# Patient Record
Sex: Female | Born: 1970 | Race: White | Hispanic: No | Marital: Married | Smoking: Never smoker
Health system: Southern US, Community
[De-identification: ages and names within clinical notes are randomized; demographics above are authoritative.]

## PROBLEM LIST (undated history)

## (undated) DIAGNOSIS — E039 Hypothyroidism, unspecified: Secondary | ICD-10-CM

## (undated) DIAGNOSIS — M545 Low back pain: Secondary | ICD-10-CM

## (undated) DIAGNOSIS — E559 Vitamin D deficiency, unspecified: Secondary | ICD-10-CM

## (undated) DIAGNOSIS — M2550 Pain in unspecified joint: Secondary | ICD-10-CM

## (undated) DIAGNOSIS — R0989 Other specified symptoms and signs involving the circulatory and respiratory systems: Secondary | ICD-10-CM

## (undated) DIAGNOSIS — Z124 Encounter for screening for malignant neoplasm of cervix: Secondary | ICD-10-CM

## (undated) DIAGNOSIS — Z86718 Personal history of other venous thrombosis and embolism: Secondary | ICD-10-CM

## (undated) DIAGNOSIS — K829 Disease of gallbladder, unspecified: Secondary | ICD-10-CM

## (undated) DIAGNOSIS — F32A Depression, unspecified: Secondary | ICD-10-CM

## (undated) DIAGNOSIS — N390 Urinary tract infection, site not specified: Secondary | ICD-10-CM

## (undated) DIAGNOSIS — R011 Cardiac murmur, unspecified: Secondary | ICD-10-CM

## (undated) DIAGNOSIS — Z0000 Encounter for general adult medical examination without abnormal findings: Secondary | ICD-10-CM

## (undated) DIAGNOSIS — B019 Varicella without complication: Secondary | ICD-10-CM

## (undated) DIAGNOSIS — R51 Headache: Secondary | ICD-10-CM

## (undated) DIAGNOSIS — G540 Brachial plexus disorders: Secondary | ICD-10-CM

## (undated) DIAGNOSIS — E669 Obesity, unspecified: Secondary | ICD-10-CM

## (undated) DIAGNOSIS — M069 Rheumatoid arthritis, unspecified: Secondary | ICD-10-CM

## (undated) DIAGNOSIS — M25562 Pain in left knee: Principal | ICD-10-CM

## (undated) DIAGNOSIS — L309 Dermatitis, unspecified: Secondary | ICD-10-CM

## (undated) DIAGNOSIS — J209 Acute bronchitis, unspecified: Principal | ICD-10-CM

## (undated) DIAGNOSIS — E063 Autoimmune thyroiditis: Secondary | ICD-10-CM

## (undated) DIAGNOSIS — J45909 Unspecified asthma, uncomplicated: Secondary | ICD-10-CM

## (undated) DIAGNOSIS — K219 Gastro-esophageal reflux disease without esophagitis: Secondary | ICD-10-CM

## (undated) DIAGNOSIS — K589 Irritable bowel syndrome without diarrhea: Secondary | ICD-10-CM

## (undated) DIAGNOSIS — M1990 Unspecified osteoarthritis, unspecified site: Secondary | ICD-10-CM

## (undated) DIAGNOSIS — R519 Headache, unspecified: Secondary | ICD-10-CM

## (undated) DIAGNOSIS — K921 Melena: Secondary | ICD-10-CM

## (undated) DIAGNOSIS — R0602 Shortness of breath: Secondary | ICD-10-CM

## (undated) DIAGNOSIS — M549 Dorsalgia, unspecified: Secondary | ICD-10-CM

## (undated) DIAGNOSIS — F419 Anxiety disorder, unspecified: Secondary | ICD-10-CM

## (undated) DIAGNOSIS — G43909 Migraine, unspecified, not intractable, without status migrainosus: Secondary | ICD-10-CM

## (undated) DIAGNOSIS — T7840XA Allergy, unspecified, initial encounter: Secondary | ICD-10-CM

## (undated) DIAGNOSIS — R079 Chest pain, unspecified: Secondary | ICD-10-CM

## (undated) HISTORY — PX: ENDOMETRIAL ABLATION: SHX621

## (undated) HISTORY — DX: Irritable bowel syndrome, unspecified: K58.9

## (undated) HISTORY — DX: Gastro-esophageal reflux disease without esophagitis: K21.9

## (undated) HISTORY — DX: Low back pain: M54.5

## (undated) HISTORY — DX: Other specified symptoms and signs involving the circulatory and respiratory systems: R09.89

## (undated) HISTORY — DX: Depression, unspecified: F32.A

## (undated) HISTORY — PX: CHOLECYSTECTOMY: SHX55

## (undated) HISTORY — DX: Chest pain, unspecified: R07.9

## (undated) HISTORY — DX: Unspecified osteoarthritis, unspecified site: M19.90

## (undated) HISTORY — DX: Obesity, unspecified: E66.9

## (undated) HISTORY — DX: Headache: R51

## (undated) HISTORY — DX: Encounter for general adult medical examination without abnormal findings: Z00.00

## (undated) HISTORY — DX: Acute bronchitis, unspecified: J20.9

## (undated) HISTORY — DX: Allergy, unspecified, initial encounter: T78.40XA

## (undated) HISTORY — DX: Pain in left knee: M25.562

## (undated) HISTORY — DX: Melena: K92.1

## (undated) HISTORY — PX: TONSILLECTOMY: SUR1361

## (undated) HISTORY — DX: Cardiac murmur, unspecified: R01.1

## (undated) HISTORY — DX: Encounter for screening for malignant neoplasm of cervix: Z12.4

## (undated) HISTORY — DX: Urinary tract infection, site not specified: N39.0

## (undated) HISTORY — DX: Varicella without complication: B01.9

## (undated) HISTORY — DX: Rheumatoid arthritis, unspecified: M06.9

## (undated) HISTORY — DX: Pain in unspecified joint: M25.50

## (undated) HISTORY — DX: Migraine, unspecified, not intractable, without status migrainosus: G43.909

## (undated) HISTORY — DX: Autoimmune thyroiditis: E06.3

## (undated) HISTORY — DX: Dorsalgia, unspecified: M54.9

## (undated) HISTORY — DX: Brachial plexus disorders: G54.0

## (undated) HISTORY — DX: Headache, unspecified: R51.9

## (undated) HISTORY — DX: Dermatitis, unspecified: L30.9

## (undated) HISTORY — DX: Disease of gallbladder, unspecified: K82.9

## (undated) HISTORY — DX: Anxiety disorder, unspecified: F41.9

## (undated) HISTORY — DX: Personal history of other venous thrombosis and embolism: Z86.718

## (undated) HISTORY — DX: Shortness of breath: R06.02

## (undated) HISTORY — DX: Unspecified asthma, uncomplicated: J45.909

## (undated) HISTORY — PX: APPENDECTOMY: SHX54

---
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
## 1992-12-18 HISTORY — PX: APPENDECTOMY: SHX54

## 1997-12-18 HISTORY — PX: TUBAL LIGATION: SHX77

## 1998-02-15 ENCOUNTER — Ambulatory Visit: Admission: RE | Admit: 1998-02-15 | Discharge: 1998-02-15 | Payer: Self-pay | Admitting: Obstetrics and Gynecology

## 1998-04-21 ENCOUNTER — Inpatient Hospital Stay (HOSPITAL_COMMUNITY): Admission: AD | Admit: 1998-04-21 | Discharge: 1998-04-24 | Payer: Self-pay | Admitting: Obstetrics and Gynecology

## 1999-01-10 ENCOUNTER — Emergency Department (HOSPITAL_COMMUNITY): Admission: EM | Admit: 1999-01-10 | Discharge: 1999-01-10 | Payer: Self-pay | Admitting: Emergency Medicine

## 1999-10-19 ENCOUNTER — Encounter: Payer: Self-pay | Admitting: Emergency Medicine

## 1999-10-19 ENCOUNTER — Emergency Department (HOSPITAL_COMMUNITY): Admission: EM | Admit: 1999-10-19 | Discharge: 1999-10-19 | Payer: Self-pay | Admitting: Emergency Medicine

## 2000-07-03 ENCOUNTER — Other Ambulatory Visit: Admission: RE | Admit: 2000-07-03 | Discharge: 2000-07-03 | Payer: Self-pay | Admitting: Obstetrics and Gynecology

## 2001-07-09 ENCOUNTER — Other Ambulatory Visit: Admission: RE | Admit: 2001-07-09 | Discharge: 2001-07-09 | Payer: Self-pay | Admitting: Obstetrics and Gynecology

## 2002-11-18 ENCOUNTER — Other Ambulatory Visit: Admission: RE | Admit: 2002-11-18 | Discharge: 2002-11-18 | Payer: Self-pay | Admitting: Obstetrics and Gynecology

## 2004-06-03 ENCOUNTER — Other Ambulatory Visit: Admission: RE | Admit: 2004-06-03 | Discharge: 2004-06-03 | Payer: Self-pay | Admitting: Obstetrics and Gynecology

## 2004-07-21 ENCOUNTER — Encounter (INDEPENDENT_AMBULATORY_CARE_PROVIDER_SITE_OTHER): Payer: Self-pay | Admitting: *Deleted

## 2004-07-21 ENCOUNTER — Ambulatory Visit (HOSPITAL_COMMUNITY): Admission: RE | Admit: 2004-07-21 | Discharge: 2004-07-21 | Payer: Self-pay | Admitting: Obstetrics and Gynecology

## 2005-06-08 ENCOUNTER — Other Ambulatory Visit: Admission: RE | Admit: 2005-06-08 | Discharge: 2005-06-08 | Payer: Self-pay | Admitting: Obstetrics and Gynecology

## 2007-12-19 HISTORY — PX: ABLATION: SHX5711

## 2008-07-30 ENCOUNTER — Ambulatory Visit (HOSPITAL_COMMUNITY): Admission: RE | Admit: 2008-07-30 | Discharge: 2008-07-30 | Payer: Self-pay | Admitting: Internal Medicine

## 2008-08-19 ENCOUNTER — Encounter (INDEPENDENT_AMBULATORY_CARE_PROVIDER_SITE_OTHER): Payer: Self-pay | Admitting: General Surgery

## 2008-08-19 ENCOUNTER — Ambulatory Visit (HOSPITAL_COMMUNITY): Admission: RE | Admit: 2008-08-19 | Discharge: 2008-08-20 | Payer: Self-pay | Admitting: General Surgery

## 2008-11-30 ENCOUNTER — Other Ambulatory Visit: Admission: RE | Admit: 2008-11-30 | Discharge: 2008-11-30 | Payer: Self-pay | Admitting: Internal Medicine

## 2009-09-17 ENCOUNTER — Emergency Department (HOSPITAL_COMMUNITY): Admission: EM | Admit: 2009-09-17 | Discharge: 2009-09-17 | Payer: Self-pay | Admitting: Emergency Medicine

## 2009-10-25 ENCOUNTER — Ambulatory Visit (HOSPITAL_COMMUNITY): Admission: RE | Admit: 2009-10-25 | Discharge: 2009-10-25 | Payer: Self-pay | Admitting: Internal Medicine

## 2009-12-18 HISTORY — PX: CHOLECYSTECTOMY: SHX55

## 2009-12-18 LAB — HM PAP SMEAR: HM Pap smear: NORMAL

## 2010-03-15 ENCOUNTER — Encounter (INDEPENDENT_AMBULATORY_CARE_PROVIDER_SITE_OTHER): Payer: Self-pay | Admitting: Internal Medicine

## 2010-03-15 ENCOUNTER — Ambulatory Visit (HOSPITAL_COMMUNITY): Admission: RE | Admit: 2010-03-15 | Discharge: 2010-03-15 | Payer: Self-pay | Admitting: Internal Medicine

## 2010-08-02 ENCOUNTER — Encounter (HOSPITAL_COMMUNITY): Admission: RE | Admit: 2010-08-02 | Discharge: 2010-09-01 | Payer: Self-pay | Admitting: Neurology

## 2010-09-07 ENCOUNTER — Encounter (HOSPITAL_COMMUNITY): Admission: RE | Admit: 2010-09-07 | Discharge: 2010-10-07 | Payer: Self-pay | Admitting: Neurology

## 2010-10-17 ENCOUNTER — Ambulatory Visit (HOSPITAL_COMMUNITY)
Admission: RE | Admit: 2010-10-17 | Discharge: 2010-10-17 | Payer: Self-pay | Source: Home / Self Care | Admitting: Gastroenterology

## 2010-12-21 ENCOUNTER — Ambulatory Visit (HOSPITAL_COMMUNITY)
Admission: RE | Admit: 2010-12-21 | Discharge: 2010-12-21 | Payer: Self-pay | Source: Home / Self Care | Attending: Gastroenterology | Admitting: Gastroenterology

## 2011-03-23 LAB — URINE MICROSCOPIC-ADD ON

## 2011-03-23 LAB — URINALYSIS, ROUTINE W REFLEX MICROSCOPIC
Glucose, UA: NEGATIVE mg/dL
Leukocytes, UA: NEGATIVE
Protein, ur: NEGATIVE mg/dL
Specific Gravity, Urine: 1.025 (ref 1.005–1.030)
pH: 6 (ref 5.0–8.0)

## 2011-03-23 LAB — URINE CULTURE

## 2011-05-02 NOTE — Op Note (Signed)
NAMEESMA, Alicia Cox             ACCOUNT NO.:  [id number]   MEDICAL RECORD NO.:  [id number]          PATIENT TYPE:  OIB   LOCATION:  A306                          FACILITY:  [facility]   PHYSICIAN:  Barbaraann Barthel, M.D. DATE OF BIRTH:  May 29, 1971   DATE OF PROCEDURE:  08/19/2008  DATE OF DISCHARGE:                               OPERATIVE REPORT   SURGEON:  Barbaraann Barthel, MD   PREOPERATIVE DIAGNOSES:  1. Cholecystitis.  2. Cholelithiasis.   POSTOPERATIVE DIAGNOSES:  1. Cholecystitis.  2. Cholelithiasis.   PROCEDURE:  Laparoscopic cholecystectomy.   SPECIMEN:  Gallbladder with stones.   WOUND CLASSIFICATION:  Clean, contaminated.   NOTE:  This is a 40 year old white female referred from [facility] for gallbladder disease.  She had had a 77-month history of right  upper quadrant pain, nausea, and vomiting.  Her sonogram showed multiple  stones within the gallbladder.  The gallbladder almost completely  impacted with stones.  In fact, liver function studies were grossly  within normal limits.  The patient was placed on a restrictive diet.  We  planned for elective surgery.  We discussed complications not limited  to, but including bleeding, infection, damage to bile ducts, perforation  of organs, transitory diarrhea, and possibility of open surgery might be  required.  Informed consent was obtained.   GROSS OPERATIVE FINDINGS:  The patient had some adhesions around the  liver from previous surgery and a gallbladder that was completely  impacted with stones.  Cystic duct was normal in size, we did not  cannulate this.  The right upper quadrant was, otherwise, within normal  limits.  There was considerable fatty infiltration of the distal portion  of the gallbladder around [facility] pouch.  There was a prominent node of  Calot.  Otherwise, the right upper quadrant was grossly within normal  limits with the signs of subsiding acute episode of cholecystitis.   The  patient was placed in a supine position, and after adequate  administration of general anesthesia via endotracheal intubation, a  Foley catheter was aseptically inserted.  The patient was prepped with  Betadine solution and draped in the usual manner.  A periumbilical  incision was carried out above the previous umbilical incision.  We  dissected down to the fascia, opened this, and then placed a Veress  needle and confirmed this position with a saline drop test.  We elevated  the fascia and opened the fascia directly under direct vision.  The  abdomen was then insufflated with approximately 3.5 L of CO2.  An 11-mm  cannula was placed using the Visiport technique, and under direct  vision, an 11-mm cannula was placed in the epigastrium and two 5-mm  cannulas in the right upper quadrant laterally.  The gallbladder was  grasped.  Adhesions were taken down.  The cystic duct was clearly  visualized, and after considerable dissection, I triply silver clipped  on the side of the common bile duct, singly silver clipped on the side  of the gallbladder and divided, as was the cystic artery that was also  visualized and clipped.  The gallbladder  was slightly intrahepatic.  We  had a little oozing from the liver, but, however, we controlled this  with the cautery device without any problem.  The gallbladder was then  removed using the Endosac device, and I elected to leave some Surgicel  in the liver bed and a Jackson-Pratt drain in the liver bed as well.  This was exited through one of the lateral 5-mm cannula sites and the  gallbladder was enlarged.  We had to open the epigastric incision a  little bit.  There was a little bleeding from the area of the rectus  muscle.  We controlled this with the cautery device and dissected free  and clearly and cauterized this.  We then closed the fascia in the area  of the epigastrium and the umbilicus with 0-Polysorb sutures.  We used  0.5% Sensorcaine at all  port sites for postoperative comfort and closed  the skin incisions with a stapling device.  I sutured the drain in place  with a 3-0 nylon suture.  Prior to closure, all sponge, needle, and  instrument counts were found to be correct.  Estimated blood loss was  approximately 100 mL.  This was the pesky bleeding around the liver and  the rectus muscle.  One Jackson-Pratt drain was placed at the liver bed  as noted, and prior to closure, all sponge, needle, and instrument  counts were found to be correct.  There were no complications.      Barbaraann Barthel, M.D.  Electronically Signed     WB/MEDQ  D:  08/19/2008  T:  08/20/2008  Job:  [id number]   cc:   [facility]

## 2011-05-05 NOTE — Op Note (Signed)
NAME:  Alicia Cox, Alicia Cox                       ACCOUNT NO.:  [id number]   MEDICAL RECORD NO.:  [id number]                   PATIENT TYPE:  AMB   LOCATION:  SDC                                  FACILITY:  [facility]   PHYSICIAN:  Malva Limes, M.D.                 DATE OF BIRTH:  1971/07/19   DATE OF PROCEDURE:  07/21/2004  DATE OF DISCHARGE:                                 OPERATIVE REPORT   PREOPERATIVE DIAGNOSIS:  Menorrhagia.   POSTOPERATIVE DIAGNOSIS:  Menorrhagia.   OPERATION PERFORMED:  1. Dilation and curettage.  2. Endometrial ablation with NovaSure device.   SURGEON:  Malva Limes, M.D.   ANESTHESIA:  General.   ANTIBIOTICS:  Ancef 1 g.   ESTIMATED BLOOD LOSS:  Minimal.   COMPLICATIONS:  None.   SPECIMENS:  Endometrial curettings to pathology.   DRAINS:  None.   DESCRIPTION OF PROCEDURE:  The patient was taken to the operating room where  she was placed in dorsal supine position.  General anesthetic was  administered without complications.  She was then placed in dorsal lithotomy  position.  She was prepped with Hibiclens and draped in the usual fashion  for this procedure.  At this point a sterile speculum was placed in the  vagina. A single toothed tenaculum was applied to the anterior cervical lip.  10 mL of 1% lidocaine was used for a paracervical block.  The cervical os  was then dilated to a 6 [place].  The sound was placed and the uterine cavity  was sounded to 10 cm.  At this point an 8 [place] Novak curet was placed into  the cervical canal to measure the cervical length.  This was recorded at 4  cm giving a cavity length of 6 cm.  Following this, a sharp curettage was  performed with endometrial tissue sent to pathology.  At this point the  NovaSure device was advanced through the endocervical canal to the fundus of  the uterus.  The seating procedure was then followed.  Following this the  CO2 seal was tested and passed.  Following this, the device was  turned on  for a total of 51 seconds.  The total power was 145.  The cavity width was  4.4.  The patient tolerated the procedure well and will be discharged to  home.  She will be told to take Advil as needed and return to the office in  four weeks.                                               Malva Limes, M.D.   MA/MEDQ  D:  07/21/2004  T:  07/22/2004  Job:  [id number]

## 2011-09-20 LAB — HEPATIC FUNCTION PANEL
ALT: 87 — ABNORMAL HIGH
Alkaline Phosphatase: 56
Bilirubin, Direct: 0.2
Indirect Bilirubin: 0.5
Total Bilirubin: 0.7

## 2011-09-20 LAB — BASIC METABOLIC PANEL
Chloride: 109
GFR calc non Af Amer: >60
Glucose, Bld: 108 — ABNORMAL HIGH
Potassium: 4
Sodium: 140

## 2011-09-20 LAB — CBC
HCT: 37.3
Hemoglobin: 13
MCV: 95.6
WBC: 13.6 — ABNORMAL HIGH

## 2011-09-20 LAB — DIFFERENTIAL
Eosinophils Relative: 0
Lymphocytes Relative: 16
Lymphs Abs: 2.1
Monocytes Absolute: 1

## 2012-11-02 ENCOUNTER — Encounter (HOSPITAL_BASED_OUTPATIENT_CLINIC_OR_DEPARTMENT_OTHER): Payer: Self-pay | Admitting: Emergency Medicine

## 2012-11-02 ENCOUNTER — Emergency Department (HOSPITAL_BASED_OUTPATIENT_CLINIC_OR_DEPARTMENT_OTHER): Payer: BC Managed Care – PPO

## 2012-11-02 ENCOUNTER — Emergency Department (HOSPITAL_BASED_OUTPATIENT_CLINIC_OR_DEPARTMENT_OTHER)
Admission: EM | Admit: 2012-11-02 | Discharge: 2012-11-02 | Disposition: A | Discharge status: Home / Self Care | Payer: BC Managed Care – PPO | Attending: Emergency Medicine | Admitting: Emergency Medicine

## 2012-11-02 DIAGNOSIS — E039 Hypothyroidism, unspecified: Secondary | ICD-10-CM | POA: Insufficient documentation

## 2012-11-02 DIAGNOSIS — E559 Vitamin D deficiency, unspecified: Secondary | ICD-10-CM | POA: Insufficient documentation

## 2012-11-02 DIAGNOSIS — N760 Acute vaginitis: Secondary | ICD-10-CM | POA: Insufficient documentation

## 2012-11-02 DIAGNOSIS — Z79899 Other long term (current) drug therapy: Secondary | ICD-10-CM | POA: Insufficient documentation

## 2012-11-02 HISTORY — DX: Hypothyroidism, unspecified: E03.9

## 2012-11-02 HISTORY — DX: Vitamin D deficiency, unspecified: E55.9

## 2012-11-02 LAB — WET PREP, GENITAL

## 2012-11-02 LAB — URINALYSIS, ROUTINE W REFLEX MICROSCOPIC
Bilirubin Urine: NEGATIVE
Hgb urine dipstick: NEGATIVE
Ketones, ur: NEGATIVE mg/dL
Protein, ur: NEGATIVE mg/dL
Urobilinogen, UA: 0.2 mg/dL (ref 0.0–1.0)

## 2012-11-02 MED ORDER — METRONIDAZOLE 500 MG PO TABS: 500.0000 mg | ORAL_TABLET | Freq: Two times a day (BID) | ORAL | Status: DC

## 2012-11-02 MED ORDER — METRONIDAZOLE 500 MG PO TABS
500.0000 mg | ORAL_TABLET | Freq: Once | ORAL | Status: AC
  Administered 2012-11-02: 500 mg via ORAL
  Filled 2012-11-02: qty 1

## 2012-11-02 MED ORDER — SODIUM CHLORIDE 0.9 % IV SOLN: Freq: Once | INTRAVENOUS | Status: DC

## 2012-11-02 MED ORDER — HYDROCODONE-ACETAMINOPHEN 5-325 MG PO TABS: 1.0000 | ORAL_TABLET | ORAL | Status: AC | PRN

## 2012-11-02 MED ORDER — HYDROCODONE-ACETAMINOPHEN 5-325 MG PO TABS
1.0000 | ORAL_TABLET | Freq: Once | ORAL | Status: AC
  Administered 2012-11-02: 1 via ORAL
  Filled 2012-11-02: qty 1

## 2012-11-02 MED ORDER — ONDANSETRON HCL 4 MG/2ML IJ SOLN
4.0000 mg | Freq: Once | INTRAMUSCULAR | Status: AC
  Administered 2012-11-02: 4 mg via INTRAVENOUS
  Filled 2012-11-02: qty 2

## 2012-11-02 MED ORDER — MORPHINE SULFATE 4 MG/ML IJ SOLN
4.0000 mg | Freq: Once | INTRAMUSCULAR | Status: AC
Start: 2012-11-02 — End: 2012-11-02
  Administered 2012-11-02: 4 mg via INTRAVENOUS
  Filled 2012-11-02: qty 1

## 2012-11-02 NOTE — ED Provider Notes (Signed)
History     CSN: [id number]  Arrival date & time 11/02/12  0865   None     Chief Complaint  Patient presents with  . Abdominal Pain    (Consider location/radiation/quality/duration/timing/severity/associated sxs/prior treatment) Patient is a 41 y.o. female presenting with abdominal pain. The history is provided by the patient.  Abdominal Pain The primary symptoms of the illness include abdominal pain. The primary symptoms of the illness do not include fever, nausea, vomiting, diarrhea or dysuria. The current episode started more than 2 days ago (Pt has a 3 day history of right lower abdominal pain.).  The abdominal pain began 2 days ago. The pain came on gradually. The abdominal pain has been unchanged since its onset. The abdominal pain is located in the RLQ. The abdominal pain does not radiate. The severity of the abdominal pain is 8/10. The abdominal pain is relieved by nothing. Exacerbated by: Nothing.  Associated with: Nothing. The patient states that she believes she is currently not pregnant. The patient has not had a change in bowel habit. Risk factors for an acute abdominal problem include a history of abdominal surgery. Symptoms associated with the illness do not include chills.    Past Medical History  Diagnosis Date  . Hypothyroidism   . Vitamin D deficiency     Past Surgical History  Procedure Date  . Tonsillectomy   . Cholecystectomy   . Appendectomy   . Cesarean section   . Endometrial ablation     No family history on file.  History  Substance Use Topics  . Smoking status: Not on file  . Smokeless tobacco: Not on file  . Alcohol Use:     OB History    Grav Para Term Preterm Abortions TAB SAB Ect Mult Living                  Review of Systems  Constitutional: Negative for fever and chills.  HENT: Negative.   Eyes: Negative.   Respiratory: Negative.   Cardiovascular: Negative.   Gastrointestinal: Positive for abdominal pain. Negative for  nausea, vomiting and diarrhea.  Genitourinary: Negative.  Negative for dysuria and difficulty urinating.       No menses, no vaginal discharge.  Musculoskeletal: Negative.   Skin: Negative.   Neurological: Negative.   Psychiatric/Behavioral: Negative.     Allergies  Review of patient's allergies indicates no known allergies.  Home Medications   Current Outpatient Rx  Name  Route  Sig  Dispense  Refill  . VITAMIN D 1000 UNITS PO TABS   Oral   Take 10,000 Units by mouth once a week.         . OMEGA-3 FATTY ACIDS 1000 MG PO CAPS   Oral   Take 2 g by mouth daily.         Marland Kitchen LEVOTHYROXINE SODIUM 88 MCG PO TABS   Oral   Take 88 mcg by mouth daily.           BP 134/92  Pulse 71  Temp 98.1 F (36.7 C) (Oral)  Resp 16  Ht 5\' 4"  (1.626 m)  Wt 230 lb (104.327 kg)  BMI 39.48 kg/m2  SpO2 96%  Physical Exam  Nursing note and vitals reviewed. Constitutional: She is oriented to person, place, and time. She appears well-developed and well-nourished.       In mild-moderate distress with RLQ pain.  HENT:  Head: Normocephalic and atraumatic.  Right Ear: External ear normal.  Left Ear: External ear  normal.  Eyes: Conjunctivae normal and EOM are normal. Pupils are equal, round, and reactive to light. No scleral icterus.  Neck: Normal range of motion. Neck supple.  Cardiovascular: Normal rate, regular rhythm and normal heart sounds.   Pulmonary/Chest: Effort normal and breath sounds normal.  Abdominal: Soft. Bowel sounds are normal.       She localizes pain to the right lower abdomen.  There is no mass or point tenderness, no rebound or rigidity.  Genitourinary:       She has a grayish mucoid discharge.  Bimanual exam shows mild right adnexal tenderness.  There is no apparent uterine or left adnexal tenderness or enlargement.  Exam lacks some sensitivity due to pt's body habitus.  Musculoskeletal: Normal range of motion. She exhibits no edema and no tenderness.  Neurological:  She is alert and oriented to person, place, and time.       No sensory or motor deficit.  Skin: Skin is warm and dry.  Psychiatric: She has a normal mood and affect. Her behavior is normal.    ED Course  Procedures (including critical care time)   9:21 AM Pt seen --> physical exam performed.  Pelvic cart ordered.  Lab tests and pelvic ultrasound ordered.   1. Bacterial vaginosis    Wet prep showed clue cells --> Dx bacterial vaginosis.  Pelvic ultrasound negative.  Rx metronidazole, hydrocodone-acetaminophen.    Carleene Cooper III, MD 11/02/12 [phone]

## 2012-11-02 NOTE — ED Notes (Signed)
The pelvic cart is set up at the bedside and ready for the doctor to use. 

## 2012-11-02 NOTE — ED Notes (Signed)
Pt having RLQ abdominal pain since Thursday.  Some nausea.  No vomiting.  No known fever.  Pt denies dysuria.

## 2012-11-02 NOTE — ED Notes (Signed)
Patient transported to Ultrasound 

## 2012-11-04 LAB — GC/CHLAMYDIA PROBE AMP: GC Probe RNA: NEGATIVE

## 2013-04-23 ENCOUNTER — Ambulatory Visit (INDEPENDENT_AMBULATORY_CARE_PROVIDER_SITE_OTHER): Payer: BC Managed Care – PPO | Admitting: General Practice

## 2013-04-23 ENCOUNTER — Encounter: Payer: Self-pay | Admitting: General Practice

## 2013-04-23 ENCOUNTER — Telehealth: Payer: Self-pay | Admitting: Nurse Practitioner

## 2013-04-23 VITALS — BP 125/80 | HR 82 | Temp 98.8°F | Ht 64.0 in | Wt 250.0 lb

## 2013-04-23 DIAGNOSIS — N390 Urinary tract infection, site not specified: Secondary | ICD-10-CM

## 2013-04-23 DIAGNOSIS — R350 Frequency of micturition: Secondary | ICD-10-CM

## 2013-04-23 LAB — POCT URINALYSIS DIPSTICK
Glucose, UA: NEGATIVE
Nitrite, UA: NEGATIVE
Protein, UA: NEGATIVE
Spec Grav, UA: 1.02
Urobilinogen, UA: NEGATIVE
pH, UA: 6.5

## 2013-04-23 LAB — POCT UA - MICROSCOPIC ONLY

## 2013-04-23 MED ORDER — CIPROFLOXACIN HCL 500 MG PO TABS: 500.0000 mg | ORAL_TABLET | Freq: Two times a day (BID) | ORAL | Status: DC

## 2013-04-23 NOTE — Telephone Encounter (Signed)
appt given for 2:30 with Alicia Cox

## 2013-04-23 NOTE — Progress Notes (Signed)
  Subjective:    Patient ID: Alicia Cox, female    DOB: 08-Mar-1971, 42 y.o.   MRN: [id number]  Urinary Tract Infection  This is a new problem. The current episode started in the past 7 days (also back and abdominal pain before urinating). The problem has been gradually worsening. The quality of the pain is described as aching. The pain is at a severity of 7/10. The pain is moderate. There has been no fever. She is sexually active. There is no history of pyelonephritis. Associated symptoms include flank pain and frequency. Pertinent negatives include no chills. She has tried NSAIDs for the symptoms. Her past medical history is significant for recurrent UTIs. There is no history of kidney stones.   Reports drinking two cups of coffee daily and water (40oz)  the remaining day.    Review of Systems  Constitutional: Negative for chills.  Gastrointestinal: Positive for abdominal pain.  Genitourinary: Positive for dysuria, frequency and flank pain. Negative for difficulty urinating and dyspareunia.  Musculoskeletal: Positive for back pain.  Skin: Negative.   Neurological: Negative for dizziness and headaches.  Psychiatric/Behavioral: Negative.        Objective:   Physical Exam  Constitutional: She is oriented to person, place, and time. She appears well-developed and well-nourished.  Cardiovascular: Normal rate, regular rhythm and normal heart sounds.   Pulmonary/Chest: Effort normal and breath sounds normal.  Abdominal: Soft. Bowel sounds are normal. She exhibits no distension. There is no tenderness. There is no rebound and no guarding.  Neurological: She is alert and oriented to person, place, and time.  Skin: Skin is warm and dry.  Psychiatric: She has a normal mood and affect.   Results for orders placed in visit on 04/23/13  POCT UA - MICROSCOPIC ONLY      Result Value Range   WBC, Ur, HPF, POC 0-1     RBC, urine, microscopic 2-7     Bacteria, U Microscopic mod     Mucus, UA  mod     Epithelial cells, urine per micros occ     Crystals, Ur, HPF, POC neg     Casts, Ur, LPF, POC occ     Yeast, UA neg    POCT URINALYSIS DIPSTICK      Result Value Range   Color, UA yellow     Clarity, UA clear     Glucose, UA neg     Bilirubin, UA neg     Ketones, UA neg     Spec Grav, UA 1.020     Blood, UA trace     pH, UA 6.5     Protein, UA neg     Urobilinogen, UA negative     Nitrite, UA neg     Leukocytes, UA Negative            Assessment & Plan:  Take antibiotics until complete, even if feeling better Increase fluid intake Frequent voiding Proper perineal hygiene RTO prn Culture pending Patient verbalized understanding Coralie Keens, FNP-C

## 2013-04-23 NOTE — Patient Instructions (Signed)
Urinary Tract Infection Urinary tract infections (UTIs) can develop anywhere along your urinary tract. Your urinary tract is your body's drainage system for removing wastes and extra water. Your urinary tract includes two kidneys, two ureters, a bladder, and a urethra. Your kidneys are a pair of bean-shaped organs. Each kidney is about the size of your fist. They are located below your ribs, one on each side of your spine. CAUSES Infections are caused by microbes, which are microscopic organisms, including fungi, viruses, and bacteria. These organisms are so small that they can only be seen through a microscope. Bacteria are the microbes that most commonly cause UTIs. SYMPTOMS  Symptoms of UTIs may vary by age and gender of the patient and by the location of the infection. Symptoms in young women typically include a frequent and intense urge to urinate and a painful, burning feeling in the bladder or urethra during urination. Older women and men are more likely to be tired, shaky, and weak and have muscle aches and abdominal pain. A fever may mean the infection is in your kidneys. Other symptoms of a kidney infection include pain in your back or sides below the ribs, nausea, and vomiting. DIAGNOSIS To diagnose a UTI, your caregiver will ask you about your symptoms. Your caregiver also will ask to provide a urine sample. The urine sample will be tested for bacteria and white blood cells. White blood cells are made by your body to help fight infection. TREATMENT  Typically, UTIs can be treated with medication. Because most UTIs are caused by a bacterial infection, they usually can be treated with the use of antibiotics. The choice of antibiotic and length of treatment depend on your symptoms and the type of bacteria causing your infection. HOME CARE INSTRUCTIONS  If you were prescribed antibiotics, take them exactly as your caregiver instructs you. Finish the medication even if you feel better after you  have only taken some of the medication.  Drink enough water and fluids to keep your urine clear or pale yellow.  Avoid caffeine, tea, and carbonated beverages. They tend to irritate your bladder.  Empty your bladder often. Avoid holding urine for long periods of time.  Empty your bladder before and after sexual intercourse.  After a bowel movement, women should cleanse from front to back. Use each tissue only once. SEEK MEDICAL CARE IF:   You have back pain.  You develop a fever.  Your symptoms do not begin to resolve within 3 days. SEEK IMMEDIATE MEDICAL CARE IF:   You have severe back pain or lower abdominal pain.  You develop chills.  You have nausea or vomiting.  You have continued burning or discomfort with urination. MAKE SURE YOU:   Understand these instructions.  Will watch your condition.  Will get help right away if you are not doing well or get worse. Document Released: 09/13/2005 Document Revised: 06/04/2012 Document Reviewed: 01/12/2012 [employer] Patient Information 2013 [employer].  

## 2013-08-22 ENCOUNTER — Ambulatory Visit (INDEPENDENT_AMBULATORY_CARE_PROVIDER_SITE_OTHER): Payer: BC Managed Care – PPO | Admitting: Family Medicine

## 2013-08-22 ENCOUNTER — Other Ambulatory Visit: Payer: Self-pay | Admitting: Family Medicine

## 2013-08-22 ENCOUNTER — Encounter: Payer: Self-pay | Admitting: Family Medicine

## 2013-08-22 VITALS — BP 112/70 | HR 76 | Temp 98.3°F | Ht 64.0 in | Wt 257.1 lb

## 2013-08-22 DIAGNOSIS — K219 Gastro-esophageal reflux disease without esophagitis: Secondary | ICD-10-CM

## 2013-08-22 DIAGNOSIS — E039 Hypothyroidism, unspecified: Secondary | ICD-10-CM | POA: Insufficient documentation

## 2013-08-22 DIAGNOSIS — T7840XA Allergy, unspecified, initial encounter: Secondary | ICD-10-CM

## 2013-08-22 DIAGNOSIS — E785 Hyperlipidemia, unspecified: Secondary | ICD-10-CM

## 2013-08-22 DIAGNOSIS — G43909 Migraine, unspecified, not intractable, without status migrainosus: Secondary | ICD-10-CM | POA: Insufficient documentation

## 2013-08-22 DIAGNOSIS — R739 Hyperglycemia, unspecified: Secondary | ICD-10-CM

## 2013-08-22 DIAGNOSIS — E559 Vitamin D deficiency, unspecified: Secondary | ICD-10-CM

## 2013-08-22 DIAGNOSIS — R002 Palpitations: Secondary | ICD-10-CM

## 2013-08-22 DIAGNOSIS — R7309 Other abnormal glucose: Secondary | ICD-10-CM

## 2013-08-22 DIAGNOSIS — M542 Cervicalgia: Secondary | ICD-10-CM

## 2013-08-22 DIAGNOSIS — R011 Cardiac murmur, unspecified: Secondary | ICD-10-CM | POA: Insufficient documentation

## 2013-08-22 DIAGNOSIS — R51 Headache: Secondary | ICD-10-CM

## 2013-08-22 DIAGNOSIS — G540 Brachial plexus disorders: Secondary | ICD-10-CM

## 2013-08-22 DIAGNOSIS — R079 Chest pain, unspecified: Secondary | ICD-10-CM

## 2013-08-22 DIAGNOSIS — R5381 Other malaise: Secondary | ICD-10-CM

## 2013-08-22 DIAGNOSIS — M1990 Unspecified osteoarthritis, unspecified site: Secondary | ICD-10-CM | POA: Insufficient documentation

## 2013-08-22 DIAGNOSIS — K921 Melena: Secondary | ICD-10-CM | POA: Insufficient documentation

## 2013-08-22 DIAGNOSIS — N390 Urinary tract infection, site not specified: Secondary | ICD-10-CM

## 2013-08-22 LAB — URINALYSIS
Bilirubin Urine: NEGATIVE
Glucose, UA: NEGATIVE mg/dL
Hgb urine dipstick: NEGATIVE
Ketones, ur: NEGATIVE mg/dL
Leukocytes, UA: NEGATIVE
Protein, ur: NEGATIVE mg/dL

## 2013-08-22 LAB — RENAL FUNCTION PANEL
Albumin: 4.5 g/dL (ref 3.5–5.2)
BUN: 9 mg/dL (ref 6–23)
CO2: 28 mEq/L (ref 19–32)
Calcium: 9.3 mg/dL (ref 8.4–10.5)
Glucose, Bld: 83 mg/dL (ref 70–99)
Sodium: 137 mEq/L (ref 135–145)

## 2013-08-22 LAB — CBC
HCT: 41.7 % (ref 36.0–46.0)
Hemoglobin: 14.2 g/dL (ref 12.0–15.0)
MCH: 32.1 pg (ref 26.0–34.0)
MCHC: 34.1 g/dL (ref 30.0–36.0)
MCV: 94.3 fL (ref 78.0–100.0)
RDW: 12.8 % (ref 11.5–15.5)

## 2013-08-22 LAB — LIPID PANEL
Cholesterol: 171 mg/dL (ref 0–200)
LDL Cholesterol: 96 mg/dL (ref 0–99)
VLDL: 35 mg/dL (ref 0–40)

## 2013-08-22 LAB — HEPATIC FUNCTION PANEL
ALT: 28 U/L (ref 0–35)
AST: 18 U/L (ref 0–37)
Bilirubin, Direct: 0.1 mg/dL (ref 0.0–0.3)
Total Protein: 7.3 g/dL (ref 6.0–8.3)

## 2013-08-22 MED ORDER — CYCLOBENZAPRINE HCL 10 MG PO TABS: 10.0000 mg | ORAL_TABLET | Freq: Every evening | ORAL | Status: DC | PRN

## 2013-08-22 NOTE — Patient Instructions (Addendum)
Probiotics daily such as [employer]est[employer]vantage Switch to MegaRed krill oil caps daily  Call gyn Salon Pas patches or cream   Mylanta as needed   Preventive Care for Adults, Female A healthy lifestyle and preventive care can promote health and wellness. Preventive health guidelines for women include the following key practices.  A routine yearly physical is a good way to check with your caregiver about your health and preventive screening. It is a chance to share any concerns and updates on your health, and to receive a thorough exam.  Visit your dentist for a routine exam and preventive care every 6 months. Brush your teeth twice a day and floss once a day. Good oral hygiene prevents tooth decay and gum disease.  The frequency of eye exams is based on your age, health, family medical history, use of contact lenses, and other factors. Follow your caregiver's recommendations for frequency of eye exams.  Eat a healthy diet. Foods like vegetables, fruits, whole grains, low-fat dairy products, and lean protein foods contain the nutrients you need without too many calories. Decrease your intake of foods high in solid fats, added sugars, and salt. Eat the right amount of calories for you.Get information about a proper diet from your caregiver, if necessary.  Regular physical exercise is one of the most important things you can do for your health. Most adults should get at least 150 minutes of moderate-intensity exercise (any activity that increases your heart rate and causes you to sweat) each week. In addition, most adults need muscle-strengthening exercises on 2 or more days a week.  Maintain a healthy weight. The body mass index (BMI) is a screening tool to identify possible weight problems. It provides an estimate of body fat based on height and weight. Your caregiver can help determine your BMI, and can help you achieve or maintain a healthy weight.For adults 20 years and older:  A BMI below  18.5 is considered underweight.  A BMI of 18.5 to 24.9 is normal.  A BMI of 25 to 29.9 is considered overweight.  A BMI of 30 and above is considered obese.  Maintain normal blood lipids and cholesterol levels by exercising and minimizing your intake of saturated fat. Eat a balanced diet with plenty of fruit and vegetables. Blood tests for lipids and cholesterol should begin at age 39 and be repeated every 5 years. If your lipid or cholesterol levels are high, you are over 50, or you are at high risk for heart disease, you may need your cholesterol levels checked more frequently.Ongoing high lipid and cholesterol levels should be treated with medicines if diet and exercise are not effective.  If you smoke, find out from your caregiver how to quit. If you do not use tobacco, do not start.  If you are pregnant, do not drink alcohol. If you are breastfeeding, be very cautious about drinking alcohol. If you are not pregnant and choose to drink alcohol, do not exceed 1 drink per day. One drink is considered to be 12 ounces (355 mL) of beer, 5 ounces (148 mL) of wine, or 1.5 ounces (44 mL) of liquor.  Avoid use of street drugs. Do not share needles with anyone. Ask for help if you need support or instructions about stopping the use of drugs.  High blood pressure causes heart disease and increases the risk of stroke. Your blood pressure should be checked at least every 1 to 2 years. Ongoing high blood pressure should be treated with medicines if weight  loss and exercise are not effective.  If you are 8 to 41 years old, ask your caregiver if you should take aspirin to prevent strokes.  Diabetes screening involves taking a blood sample to check your fasting blood sugar level. This should be done once every 3 years, after age 27, if you are within normal weight and without risk factors for diabetes. Testing should be considered at a younger age or be carried out more frequently if you are overweight and  have at least 1 risk factor for diabetes.  Breast cancer screening is essential preventive care for women. You should practice "breast self-awareness." This means understanding the normal appearance and feel of your breasts and may include breast self-examination. Any changes detected, no matter how small, should be reported to a caregiver. Women in their 17s and 30s should have a clinical breast exam (CBE) by a caregiver as part of a regular health exam every 1 to 3 years. After age 20, women should have a CBE every year. Starting at age 23, women should consider having a mammography (breast X-ray test) every year. Women who have a family history of breast cancer should talk to their caregiver about genetic screening. Women at a high risk of breast cancer should talk to their caregivers about having magnetic resonance imaging (MRI) and a mammography every year.  The Pap test is a screening test for cervical cancer. A Pap test can show cell changes on the cervix that might become cervical cancer if left untreated. A Pap test is a procedure in which cells are obtained and examined from the lower end of the uterus (cervix).  Women should have a Pap test starting at age 26.  Between ages 56 and 27, Pap tests should be repeated every 2 years.  Beginning at age 77, you should have a Pap test every 3 years as long as the past 3 Pap tests have been normal.  Some women have medical problems that increase the chance of getting cervical cancer. Talk to your caregiver about these problems. It is especially important to talk to your caregiver if a new problem develops soon after your last Pap test. In these cases, your caregiver may recommend more frequent screening and Pap tests.  The above recommendations are the same for women who have or have not gotten the vaccine for human papillomavirus (HPV).  If you had a hysterectomy for a problem that was not cancer or a condition that could lead to cancer, then you  no longer need Pap tests. Even if you no longer need a Pap test, a regular exam is a good idea to make sure no other problems are starting.  If you are between ages 20 and 1, and you have had normal Pap tests going back 10 years, you no longer need Pap tests. Even if you no longer need a Pap test, a regular exam is a good idea to make sure no other problems are starting.  If you have had past treatment for cervical cancer or a condition that could lead to cancer, you need Pap tests and screening for cancer for at least 20 years after your treatment.  If Pap tests have been discontinued, risk factors (such as a new sexual partner) need to be reassessed to determine if screening should be resumed.  The HPV test is an additional test that may be used for cervical cancer screening. The HPV test looks for the virus that can cause the cell changes on the cervix.  The cells collected during the Pap test can be tested for HPV. The HPV test could be used to screen women aged 19 years and older, and should be used in women of any age who have unclear Pap test results. After the age of 74, women should have HPV testing at the same frequency as a Pap test.  Colorectal cancer can be detected and often prevented. Most routine colorectal cancer screening begins at the age of 22 and continues through age 63. However, your caregiver may recommend screening at an earlier age if you have risk factors for colon cancer. On a yearly basis, your caregiver may provide home test kits to check for hidden blood in the stool. Use of a small camera at the end of a tube, to directly examine the colon (sigmoidoscopy or colonoscopy), can detect the earliest forms of colorectal cancer. Talk to your caregiver about this at age 77, when routine screening begins. Direct examination of the colon should be repeated every 5 to 10 years through age 38, unless early forms of pre-cancerous polyps or small growths are found.  Hepatitis C blood  testing is recommended for all people born from [birth year] through 1965 and any individual with known risks for hepatitis C.  Practice safe sex. Use condoms and avoid high-risk sexual practices to reduce the spread of sexually transmitted infections (STIs). STIs include gonorrhea, chlamydia, syphilis, trichomonas, herpes, HPV, and human immunodeficiency virus (HIV). Herpes, HIV, and HPV are viral illnesses that have no cure. They can result in disability, cancer, and death. Sexually active women aged 16 and younger should be checked for chlamydia. Older women with new or multiple partners should also be tested for chlamydia. Testing for other STIs is recommended if you are sexually active and at increased risk.  Osteoporosis is a disease in which the bones lose minerals and strength with aging. This can result in serious bone fractures. The risk of osteoporosis can be identified using a bone density scan. Women ages 33 and over and women at risk for fractures or osteoporosis should discuss screening with their caregivers. Ask your caregiver whether you should take a calcium supplement or vitamin D to reduce the rate of osteoporosis.  Menopause can be associated with physical symptoms and risks. Hormone replacement therapy is available to decrease symptoms and risks. You should talk to your caregiver about whether hormone replacement therapy is right for you.  Use sunscreen with sun protection factor (SPF) of 30 or more. Apply sunscreen liberally and repeatedly throughout the day. You should seek shade when your shadow is shorter than you. Protect yourself by wearing long sleeves, pants, a wide-brimmed hat, and sunglasses year round, whenever you are outdoors.  Once a month, do a whole body skin exam, using a mirror to look at the skin on your back. Notify your caregiver of new moles, moles that have irregular borders, moles that are larger than a pencil eraser, or moles that have changed in shape or  color.  Stay current with required immunizations.  Influenza. You need a dose every fall (or winter). The composition of the flu vaccine changes each year, so being vaccinated once is not enough.  Pneumococcal polysaccharide. You need 1 to 2 doses if you smoke cigarettes or if you have certain chronic medical conditions. You need 1 dose at age 23 (or older) if you have never been vaccinated.  Tetanus, diphtheria, pertussis (Tdap, Td). Get 1 dose of Tdap vaccine if you are younger than age 72, are over  65 and have contact with an infant, are a Research scientist (physical sciences), are pregnant, or simply want to be protected from whooping cough. After that, you need a Td booster dose every 10 years. Consult your caregiver if you have not had at least 3 tetanus and diphtheria-containing shots sometime in your life or have a deep or dirty wound.  HPV. You need this vaccine if you are a woman age 22 or younger. The vaccine is given in 3 doses over 6 months.  Measles, mumps, rubella (MMR). You need at least 1 dose of MMR if you were born in 1957 or later. You may also need a second dose.  Meningococcal. If you are age 36 to 32 and a first-year college student living in a residence hall, or have one of several medical conditions, you need to get vaccinated against meningococcal disease. You may also need additional booster doses.  Zoster (shingles). If you are age 27 or older, you should get this vaccine.  Varicella (chickenpox). If you have never had chickenpox or you were vaccinated but received only 1 dose, talk to your caregiver to find out if you need this vaccine.  Hepatitis A. You need this vaccine if you have a specific risk factor for hepatitis A virus infection or you simply wish to be protected from this disease. The vaccine is usually given as 2 doses, 6 to 18 months apart.  Hepatitis B. You need this vaccine if you have a specific risk factor for hepatitis B virus infection or you simply wish to be  protected from this disease. The vaccine is given in 3 doses, usually over 6 months. Preventive Services / Frequency Ages 65 to 58  Blood pressure check.** / Every 1 to 2 years.  Lipid and cholesterol check.** / Every 5 years beginning at age 84.  Clinical breast exam.** / Every 3 years for women in their 49s and 30s.  Pap test.** / Every 2 years from ages 92 through 63. Every 3 years starting at age 56 through age 81 or 32 with a history of 3 consecutive normal Pap tests.  HPV screening.** / Every 3 years from ages 20 through ages 33 to 64 with a history of 3 consecutive normal Pap tests.  Hepatitis C blood test.** / For any individual with known risks for hepatitis C.  Skin self-exam. / Monthly.  Influenza immunization.** / Every year.  Pneumococcal polysaccharide immunization.** / 1 to 2 doses if you smoke cigarettes or if you have certain chronic medical conditions.  Tetanus, diphtheria, pertussis (Tdap, Td) immunization. / A one-time dose of Tdap vaccine. After that, you need a Td booster dose every 10 years.  HPV immunization. / 3 doses over 6 months, if you are 63 and younger.  Measles, mumps, rubella (MMR) immunization. / You need at least 1 dose of MMR if you were born in 1957 or later. You may also need a second dose.  Meningococcal immunization. / 1 dose if you are age 57 to 40 and a first-year college student living in a residence hall, or have one of several medical conditions, you need to get vaccinated against meningococcal disease. You may also need additional booster doses.  Varicella immunization.** / Consult your caregiver.  Hepatitis A immunization.** / Consult your caregiver. 2 doses, 6 to 18 months apart.  Hepatitis B immunization.** / Consult your caregiver. 3 doses usually over 6 months. Ages 22 to 42  Blood pressure check.** / Every 1 to 2 years.  Lipid and cholesterol  check.** / Every 5 years beginning at age 13.  Clinical breast exam.** / Every year  after age 75.  Mammogram.** / Every year beginning at age 31 and continuing for as long as you are in good health. Consult with your caregiver.  Pap test.** / Every 3 years starting at age 74 through age 52 or 90 with a history of 3 consecutive normal Pap tests.  HPV screening.** / Every 3 years from ages 36 through ages 57 to 37 with a history of 3 consecutive normal Pap tests.  Fecal occult blood test (FOBT) of stool. / Every year beginning at age 9 and continuing until age 17. You may not need to do this test if you get a colonoscopy every 10 years.  Flexible sigmoidoscopy or colonoscopy.** / Every 5 years for a flexible sigmoidoscopy or every 10 years for a colonoscopy beginning at age 21 and continuing until age 95.  Hepatitis C blood test.** / For all people born from [birth year] through 1964 and any individual with known risks for hepatitis C.  Skin self-exam. / Monthly.  Influenza immunization.** / Every year.  Pneumococcal polysaccharide immunization.** / 1 to 2 doses if you smoke cigarettes or if you have certain chronic medical conditions.  Tetanus, diphtheria, pertussis (Tdap, Td) immunization.** / A one-time dose of Tdap vaccine. After that, you need a Td booster dose every 10 years.  Measles, mumps, rubella (MMR) immunization. / You need at least 1 dose of MMR if you were born in 1957 or later. You may also need a second dose.  Varicella immunization.** / Consult your caregiver.  Meningococcal immunization.** / Consult your caregiver.  Hepatitis A immunization.** / Consult your caregiver. 2 doses, 6 to 18 months apart.  Hepatitis B immunization.** / Consult your caregiver. 3 doses, usually over 6 months. Ages 58 and over  Blood pressure check.** / Every 1 to 2 years.  Lipid and cholesterol check.** / Every 5 years beginning at age 79.  Clinical breast exam.** / Every year after age 62.  Mammogram.** / Every year beginning at age 61 and continuing for as long as you are  in good health. Consult with your caregiver.  Pap test.** / Every 3 years starting at age 51 through age 91 or 50 with a 3 consecutive normal Pap tests. Testing can be stopped between 65 and 70 with 3 consecutive normal Pap tests and no abnormal Pap or HPV tests in the past 10 years.  HPV screening.** / Every 3 years from ages 84 through ages 26 or 54 with a history of 3 consecutive normal Pap tests. Testing can be stopped between 65 and 70 with 3 consecutive normal Pap tests and no abnormal Pap or HPV tests in the past 10 years.  Fecal occult blood test (FOBT) of stool. / Every year beginning at age 49 and continuing until age 82. You may not need to do this test if you get a colonoscopy every 10 years.  Flexible sigmoidoscopy or colonoscopy.** / Every 5 years for a flexible sigmoidoscopy or every 10 years for a colonoscopy beginning at age 79 and continuing until age 24.  Hepatitis C blood test.** / For all people born from [birth year] through 1964 and any individual with known risks for hepatitis C.  Osteoporosis screening.** / A one-time screening for women ages 73 and over and women at risk for fractures or osteoporosis.  Skin self-exam. / Monthly.  Influenza immunization.** / Every year.  Pneumococcal polysaccharide immunization.** / 1 dose at  age 59 (or older) if you have never been vaccinated.  Tetanus, diphtheria, pertussis (Tdap, Td) immunization. / A one-time dose of Tdap vaccine if you are over 65 and have contact with an infant, are a Research scientist (physical sciences), or simply want to be protected from whooping cough. After that, you need a Td booster dose every 10 years.  Varicella immunization.** / Consult your caregiver.  Meningococcal immunization.** / Consult your caregiver.  Hepatitis A immunization.** / Consult your caregiver. 2 doses, 6 to 18 months apart.  Hepatitis B immunization.** / Check with your caregiver. 3 doses, usually over 6 months. ** Family history and personal history of  risk and conditions may change your caregiver's recommendations. Document Released: 01/30/2002 Document Revised: 02/26/2012 Document Reviewed: 05/01/2011 [facility] Patient Information 2014 [place], [place].

## 2013-08-23 LAB — HEMOGLOBIN A1C: Mean Plasma Glucose: 126 mg/dL — ABNORMAL HIGH (ref <117)

## 2013-08-24 ENCOUNTER — Encounter: Payer: Self-pay | Admitting: Family Medicine

## 2013-08-24 DIAGNOSIS — R0789 Other chest pain: Secondary | ICD-10-CM | POA: Insufficient documentation

## 2013-08-24 DIAGNOSIS — R079 Chest pain, unspecified: Secondary | ICD-10-CM

## 2013-08-24 HISTORY — DX: Chest pain, unspecified: R07.9

## 2013-08-24 NOTE — Assessment & Plan Note (Signed)
ekg normal today. Patient acknowledges a great deal of stress recently. Patient will seek care if symptoms return and do not resolve. Encouraged to consider counseling for stress management can consider a benzodiazepine if persistent.

## 2013-08-24 NOTE — Assessment & Plan Note (Signed)
Zyrtec prn  

## 2013-08-25 DIAGNOSIS — G540 Brachial plexus disorders: Secondary | ICD-10-CM

## 2013-08-25 HISTORY — DX: Brachial plexus disorders: G54.0

## 2013-08-25 NOTE — Assessment & Plan Note (Signed)
Moist heat and gentle stretching to neck, Salon Pas patches prn

## 2013-08-25 NOTE — Assessment & Plan Note (Signed)
Encouraged good hydration, small, frequent meals with lean proteins, good sleep.

## 2013-08-25 NOTE — Assessment & Plan Note (Signed)
Well controlled on current dose of Levothyroxine. 

## 2013-08-25 NOTE — Assessment & Plan Note (Signed)
UA negative today. 

## 2013-08-25 NOTE — Progress Notes (Signed)
Patient ID: Alicia Cox, female   DOB: [birth year], 42 y.o.   MRN: [id number] Alicia Cox [id number] [birth year] 08/25/2013      Progress Note-Follow Up  Subjective  Chief Complaint  Chief Complaint  Patient presents with  . Establish Care    new patient    HPI  Patient is 42 year old Caucasian female who is in today to establish care. She has numerous concerns. One is knees most notably the left and popping in bothering her slightly. No redness, warmth or swelling. No injury. She has had MRIs in the past without any great results. Patient also complaining of intermittent palpitations they only last about 30 seconds. And can occur randomly while sitting talking or standing but never awaken her. No associated chest pain. Does occasionally feel slightly short of breath. He acknowledges being under great stress lately. Also complains of some numbness and tingling in her hands and feet most notably with prolonged sitting and lying. When she gets up and moves around symptoms improve. She has an endometrial ablation 8 years ago for some fibroids and has recently just had some return of vaginal bleeding in the last 2 weeks. No fevers or chills. No vaginal discharge.  Past Medical History  Diagnosis Date  . Vitamin D deficiency   . Arthritis   . Blood in stool   . Chicken pox as a child  . Frequent headaches   . GERD (gastroesophageal reflux disease)   . Allergy     hay fever  . Heart murmur   . UTI (urinary tract infection)   . Hypothyroidism     Hashimoto's  . Migraine   . Chest pain 08/24/2013    Past Surgical History  Procedure Laterality Date  . Tonsillectomy    . Cholecystectomy    . Appendectomy    . Endometrial ablation    . Tubal ligation  1999  . Cesarean section      twice    Family History  Problem Relation Age of Onset  . Hyperlipidemia Mother   . Hypertension Mother   . Diabetes Father     type 2  . Hypertension Sister   . Diabetes Sister   . Asthma  Daughter   . Heart disease Maternal Grandmother   . Heart attack Maternal Grandfather   . Diabetes Paternal Grandmother   . Diabetes Paternal Grandfather     History   Social History  . Marital Status: Married    Spouse Name: N/A    Number of Children: N/A  . Years of Education: N/A   Occupational History  . Not on file.   Social History Main Topics  . Smoking status: Never Smoker   . Smokeless tobacco: Never Used  . Alcohol Use: No  . Drug Use: No  . Sexual Activity: Yes    Partners: Male   Other Topics Concern  . Not on file   Social History Narrative  . No narrative on file    Current Outpatient Prescriptions on File Prior to Visit  Medication Sig Dispense Refill  . cholecalciferol (VITAMIN D) 1000 UNITS tablet Take 10,000 Units by mouth once a week.      . fish oil-omega-3 fatty acids 1000 MG capsule Take 2 g by mouth daily.      Marland Kitchen levothyroxine (SYNTHROID, LEVOTHROID) 100 MCG tablet Take 100 mcg by mouth daily before breakfast.       No current facility-administered medications on file prior to visit.    No Known Allergies  Review of Systems  Review of Systems  Constitutional: Negative for fever and malaise/fatigue.  HENT: Negative for congestion.   Eyes: Negative for discharge.  Respiratory: Negative for shortness of breath.   Cardiovascular: Positive for chest pain. Negative for palpitations and leg swelling.  Gastrointestinal: Positive for heartburn and abdominal pain. Negative for nausea and diarrhea.  Genitourinary: Negative for dysuria.  Musculoskeletal: Negative for falls.  Skin: Negative for rash.  Neurological: Positive for sensory change. Negative for loss of consciousness and headaches.  Endo/Heme/Allergies: Negative for polydipsia.  Psychiatric/Behavioral: Negative for depression and suicidal ideas. The patient is nervous/anxious. The patient does not have insomnia.     Objective  BP 112/70  Pulse 76  Temp(Src) 98.3 F (36.8 C) (Oral)   Ht 5\' 4"  (1.626 m)  Wt 257 lb 1.9 oz (116.629 kg)  BMI 44.11 kg/m2  SpO2 98%  Physical Exam  Physical Exam  Constitutional: She is oriented to person, place, and time and well-developed, well-nourished, and in no distress. No distress.  HENT:  Head: Normocephalic and atraumatic.  Eyes: Conjunctivae are normal.  Neck: Neck supple. No thyromegaly present.  Cardiovascular: Normal rate, regular rhythm and normal heart sounds.   No murmur heard. Pulmonary/Chest: Effort normal and breath sounds normal. She has no wheezes.  Abdominal: She exhibits no distension and no mass.  Musculoskeletal: She exhibits no edema.  Lymphadenopathy:    She has no cervical adenopathy.  Neurological: She is alert and oriented to person, place, and time.  Skin: Skin is warm and dry. No rash noted. She is not diaphoretic.  Psychiatric: Memory, affect and judgment normal.    Lab Results  Component Value Date   TSH 4.407 08/22/2013   Lab Results  Component Value Date   WBC 10.4 08/22/2013   HGB 14.2 08/22/2013   HCT 41.7 08/22/2013   MCV 94.3 08/22/2013   PLT 344 08/22/2013   Lab Results  Component Value Date   CREATININE 0.62 08/22/2013   BUN 9 08/22/2013   NA 137 08/22/2013   K 3.9 08/22/2013   CL 102 08/22/2013   CO2 28 08/22/2013   Lab Results  Component Value Date   ALT 28 08/22/2013   AST 18 08/22/2013   ALKPHOS 58 08/22/2013   BILITOT 0.3 08/22/2013   Lab Results  Component Value Date   CHOL 171 08/22/2013   Lab Results  Component Value Date   HDL 40 08/22/2013   Lab Results  Component Value Date   LDLCALC 96 08/22/2013   Lab Results  Component Value Date   TRIG 174* 08/22/2013   Lab Results  Component Value Date   CHOLHDL 4.3 08/22/2013     Assessment & Plan  Chest pain ekg normal today. Patient acknowledges a great deal of stress recently. Patient will seek care if symptoms return and do not resolve. Encouraged to consider counseling for stress management can consider a benzodiazepine if  persistent.  Allergy Zyrtec prn.   Hypothyroidism Well controlled on current dose of Levothyroxine.   Frequent headaches Encouraged good hydration, small, frequent meals with lean proteins, good sleep.   GERD (gastroesophageal reflux disease) Avoid offending foods, start a probiotic daily  UTI (urinary tract infection) UA negative today.

## 2013-08-25 NOTE — Assessment & Plan Note (Signed)
Avoid offending foods, start a probiotic daily

## 2013-10-03 ENCOUNTER — Ambulatory Visit: Payer: BC Managed Care – PPO | Admitting: Family Medicine

## 2013-10-21 ENCOUNTER — Encounter: Payer: Self-pay | Admitting: Family Medicine

## 2013-10-21 ENCOUNTER — Ambulatory Visit (INDEPENDENT_AMBULATORY_CARE_PROVIDER_SITE_OTHER): Payer: BC Managed Care – PPO | Admitting: Family Medicine

## 2013-10-21 VITALS — BP 122/82 | HR 74 | Temp 98.5°F | Ht 64.0 in | Wt 254.1 lb

## 2013-10-21 DIAGNOSIS — E039 Hypothyroidism, unspecified: Secondary | ICD-10-CM

## 2013-10-21 DIAGNOSIS — Z23 Encounter for immunization: Secondary | ICD-10-CM

## 2013-10-21 DIAGNOSIS — M129 Arthropathy, unspecified: Secondary | ICD-10-CM

## 2013-10-21 DIAGNOSIS — M1990 Unspecified osteoarthritis, unspecified site: Secondary | ICD-10-CM

## 2013-10-21 DIAGNOSIS — M542 Cervicalgia: Secondary | ICD-10-CM

## 2013-10-21 DIAGNOSIS — K219 Gastro-esophageal reflux disease without esophagitis: Secondary | ICD-10-CM

## 2013-10-21 MED ORDER — CYCLOBENZAPRINE HCL 10 MG PO TABS: 10.0000 mg | ORAL_TABLET | Freq: Every evening | ORAL | Status: DC | PRN

## 2013-10-21 NOTE — Patient Instructions (Addendum)
Turmeric (Curcumen) for pain, NOW, [email or web address] Probiotic daily Benefiber twice a day   Irritable Bowel Syndrome Irritable Bowel Syndrome (IBS) is caused by a disturbance of normal bowel function. Other terms used are spastic colon, mucous colitis, and irritable colon. It does not require surgery, nor does it lead to cancer. There is no cure for IBS. But with proper diet, stress reduction, and medication, you will find that your problems (symptoms) will gradually disappear or improve. IBS is a common digestive disorder. It usually appears in late adolescence or early adulthood. Women develop it twice as often as men. CAUSES  After food has been digested and absorbed in the small intestine, waste material is moved into the colon (large intestine). In the colon, water and salts are absorbed from the undigested products coming from the small intestine. The remaining residue, or fecal material, is held for elimination. Under normal circumstances, gentle, rhythmic contractions on the bowel walls push the fecal material along the colon towards the rectum. In IBS, however, these contractions are irregular and poorly coordinated. The fecal material is either retained too long, resulting in constipation, or expelled too soon, producing diarrhea. SYMPTOMS  The most common symptom of IBS is pain. It is typically in the lower left side of the belly (abdomen). But it may occur anywhere in the abdomen. It can be felt as heartburn, backache, or even as a dull pain in the arms or shoulders. The pain comes from excessive bowel-muscle spasms and from the buildup of gas and fecal material in the colon. This pain:  Can range from sharp belly (abdominal) cramps to a dull, continuous ache.  Usually worsens soon after eating.  Is typically relieved by having a bowel movement or passing gas. Abdominal pain is usually accompanied by constipation. But it may also produce diarrhea. The diarrhea typically occurs right  after a meal or upon arising in the morning. The stools are typically soft and watery. They are often flecked with secretions (mucus). Other symptoms of IBS include:  Bloating.  Loss of appetite.  Heartburn.  Feeling sick to your stomach (nausea).  Belching  Vomiting  Gas. IBS may also cause a number of symptoms that are unrelated to the digestive system:  Fatigue.  Headaches.  Anxiety  Shortness of breath  Difficulty in concentrating.  Dizziness. These symptoms tend to come and go. DIAGNOSIS  The symptoms of IBS closely mimic the symptoms of other, more serious digestive disorders. So your caregiver may wish to perform a variety of additional tests to exclude these disorders. He/she wants to be certain of learning what is wrong (diagnosis). The nature and purpose of each test will be explained to you. TREATMENT A number of medications are available to help correct bowel function and/or relieve bowel spasms and abdominal pain. Among the drugs available are:  Mild, non-irritating laxatives for severe constipation and to help restore normal bowel habits.  Specific anti-diarrheal medications to treat severe or prolonged diarrhea.  Anti-spasmodic agents to relieve intestinal cramps.  Your caregiver may also decide to treat you with a mild tranquilizer or sedative during unusually stressful periods in your life. The important thing to remember is that if any drug is prescribed for you, make sure that you take it exactly as directed. Make sure that your caregiver knows how well it worked for you. HOME CARE INSTRUCTIONS   Avoid foods that are high in fat or oils. Some examples [id number]:[id number] cream, butter, frankfurters, sausage, and other fatty meats.  Avoid foods that have  a laxative effect, such as fruit, fruit juice, and dairy products.  Cut out carbonated drinks, chewing gum, and "gassy" foods, such as beans and cabbage. This may help relieve bloating and belching.  Bran  taken with plenty of liquids may help relieve constipation.  Keep track of what foods seem to trigger your symptoms.  Avoid emotionally charged situations or circumstances that produce anxiety.  Start or continue exercising.  Get plenty of rest and sleep. MAKE SURE YOU:   Understand these instructions.  Will watch your condition.  Will get help right away if you are not doing well or get worse. Document Released: 12/04/2005 Document Revised: 02/26/2012 Document Reviewed: 07/24/2008 [facility] Patient Information 2014 [place], [place].

## 2013-10-22 LAB — T4, FREE: Free T4: 1 ng/dL (ref 0.80–1.80)

## 2013-10-24 ENCOUNTER — Telehealth: Payer: Self-pay | Admitting: *Deleted

## 2013-10-24 MED ORDER — LEVOTHYROXINE SODIUM 100 MCG PO TABS: ORAL_TABLET | ORAL | Status: DC

## 2013-10-24 NOTE — Telephone Encounter (Signed)
Received message from pt returning call re: lab results. Pt voiced understanding and stated she needs a refill of levothyroxine. Refill sent.  Notes Recorded by Bradd Canary, MD on 10/22/2013 at 11:42 AM I sent her a my chart message to increase her Levothyroxine 100 mcg tabs to 1 tab po daily except 2 days a week increase by adding an extra 1/2 tab then recheck tsh and free T4 at visit in a couple of months

## 2013-10-26 ENCOUNTER — Encounter: Payer: Self-pay | Admitting: Family Medicine

## 2013-10-26 NOTE — Assessment & Plan Note (Signed)
Improved, avoid offending foods, start a probiotic, may use Zantac prn

## 2013-10-26 NOTE — Progress Notes (Signed)
Patient ID: Alicia Cox, female   DOB: [birth year], 42 y.o.   MRN: [id number] Alicia Cox [id number] Nov 18, 1971 10/26/2013      Progress Note-Follow Up  Subjective  Chief Complaint  Chief Complaint  Patient presents with  . Follow-up  . Injections    flu and tdap    HPI  Patient is a 42 yo femalie in today for follow up. No recent illness, flare in headaches or new concerns. Has ongoing trouble with joint pain most notably in the left knee and right shoulder, no recent injury or swelling, has an appt with ortho soon. No cp/palp/sob/gi or gu c/o  Past Medical History  Diagnosis Date  . Vitamin D deficiency   . Arthritis   . Blood in stool   . Chicken pox as a child  . Frequent headaches   . GERD (gastroesophageal reflux disease)   . Allergy     hay fever  . Heart murmur   . UTI (urinary tract infection)   . Hypothyroidism     Hashimoto's  . Migraine   . Chest pain 08/24/2013  . TOS (thoracic outlet syndrome) 08/25/2013    Past Surgical History  Procedure Laterality Date  . Tonsillectomy    . Cholecystectomy    . Appendectomy    . Endometrial ablation    . Tubal ligation  1999  . Cesarean section      twice    Family History  Problem Relation Age of Onset  . Hyperlipidemia Mother   . Hypertension Mother   . Diabetes Father     type 2  . Hypertension Sister   . Diabetes Sister   . Asthma Daughter   . Heart disease Maternal Grandmother   . Heart attack Maternal Grandfather   . Diabetes Paternal Grandmother   . Diabetes Paternal Grandfather     History   Social History  . Marital Status: Married    Spouse Name: N/A    Number of Children: N/A  . Years of Education: N/A   Occupational History  . Not on file.   Social History Main Topics  . Smoking status: Never Smoker   . Smokeless tobacco: Never Used  . Alcohol Use: No  . Drug Use: No  . Sexual Activity: Yes    Partners: Male   Other Topics Concern  . Not on file   Social History  Narrative  . No narrative on file    Current Outpatient Prescriptions on File Prior to Visit  Medication Sig Dispense Refill  . cholecalciferol (VITAMIN D) 1000 UNITS tablet Take 10,000 Units by mouth once a week.       No current facility-administered medications on file prior to visit.    No Known Allergies  Review of Systems  Review of Systems  Constitutional: Negative for fever and malaise/fatigue.  HENT: Negative for congestion.   Eyes: Negative for discharge.  Respiratory: Negative for shortness of breath.   Cardiovascular: Negative for chest pain, palpitations and leg swelling.  Gastrointestinal: Negative for nausea, abdominal pain and diarrhea.  Genitourinary: Negative for dysuria.  Musculoskeletal: Positive for joint pain. Negative for falls.  Skin: Negative for rash.  Neurological: Negative for loss of consciousness and headaches.  Endo/Heme/Allergies: Negative for polydipsia.  Psychiatric/Behavioral: Negative for depression and suicidal ideas. The patient is not nervous/anxious and does not have insomnia.     Objective  BP 122/82  Pulse 74  Temp(Src) 98.5 F (36.9 C) (Oral)  Ht 5\' 4"  (1.626 m)  Wt 254 lb 1.3 oz (115.25 kg)  BMI 43.59 kg/m2  SpO2 97%  Physical Exam  Physical Exam  Constitutional: She is oriented to person, place, and time and well-developed, well-nourished, and in no distress. No distress.  HENT:  Head: Normocephalic and atraumatic.  Eyes: Conjunctivae are normal.  Neck: Neck supple. No thyromegaly present.  Cardiovascular: Normal rate, regular rhythm and normal heart sounds.   No murmur heard. Pulmonary/Chest: Effort normal and breath sounds normal. She has no wheezes.  Abdominal: She exhibits no distension and no mass.  Musculoskeletal: She exhibits no edema.  Lymphadenopathy:    She has no cervical adenopathy.  Neurological: She is alert and oriented to person, place, and time.  Skin: Skin is warm and dry. No rash noted. She is  not diaphoretic.  Psychiatric: Memory, affect and judgment normal.    Lab Results  Component Value Date   TSH 4.686* 10/21/2013   Lab Results  Component Value Date   WBC 10.4 08/22/2013   HGB 14.2 08/22/2013   HCT 41.7 08/22/2013   MCV 94.3 08/22/2013   PLT 344 08/22/2013   Lab Results  Component Value Date   CREATININE 0.62 08/22/2013   BUN 9 08/22/2013   NA 137 08/22/2013   K 3.9 08/22/2013   CL 102 08/22/2013   CO2 28 08/22/2013   Lab Results  Component Value Date   ALT 28 08/22/2013   AST 18 08/22/2013   ALKPHOS 58 08/22/2013   BILITOT 0.3 08/22/2013   Lab Results  Component Value Date   CHOL 171 08/22/2013   Lab Results  Component Value Date   HDL 40 08/22/2013   Lab Results  Component Value Date   LDLCALC 96 08/22/2013   Lab Results  Component Value Date   TRIG 174* 08/22/2013   Lab Results  Component Value Date   CHOLHDL 4.3 08/22/2013     Assessment & Plan  Arthritis Has an appt with [facility] ortho soon, Dr Bryson Ha. May try Salon Pas or Ibuprofen for now.  GERD (gastroesophageal reflux disease) Improved, avoid offending foods, start a probiotic, may use Zantac prn  Hypothyroidism Mild elevation of TSh but free T4 is normal. Continue synthroid

## 2013-10-26 NOTE — Assessment & Plan Note (Signed)
Mild elevation of TSh but free T4 is normal. Continue synthroid

## 2013-10-26 NOTE — Assessment & Plan Note (Signed)
Has an appt with [facility] ortho soon, Dr Bryson Ha. May try Salon Pas or Ibuprofen for now.

## 2013-10-31 ENCOUNTER — Encounter: Payer: Self-pay | Admitting: Family Medicine

## 2013-10-31 ENCOUNTER — Ambulatory Visit (INDEPENDENT_AMBULATORY_CARE_PROVIDER_SITE_OTHER): Payer: BC Managed Care – PPO | Admitting: Family Medicine

## 2013-10-31 VITALS — BP 132/88 | HR 71 | Temp 98.1°F | Ht 64.0 in | Wt 253.1 lb

## 2013-10-31 DIAGNOSIS — J209 Acute bronchitis, unspecified: Secondary | ICD-10-CM

## 2013-10-31 DIAGNOSIS — E039 Hypothyroidism, unspecified: Secondary | ICD-10-CM

## 2013-10-31 MED ORDER — AZITHROMYCIN 250 MG PO TABS: ORAL_TABLET | ORAL | Status: DC

## 2013-10-31 MED ORDER — HYDROCODONE-HOMATROPINE 5-1.5 MG/5ML PO SYRP: 5.0000 mL | ORAL_SOLUTION | Freq: Four times a day (QID) | ORAL | Status: DC | PRN

## 2013-10-31 NOTE — Progress Notes (Signed)
Pre visit review using our clinic review tool, if applicable. No additional management support is needed unless otherwise documented below in the visit note. 

## 2013-10-31 NOTE — Patient Instructions (Signed)
  Hydrate, rest, Mcuinex 600mg  po twice daily x 10days. Probiotic, Digestive Advantage, zinc Coldeeze is a good example   Bronchitis Bronchitis is the body's way of reacting to injury and/or infection (inflammation) of the bronchi. Bronchi are the air tubes that extend from the windpipe into the lungs. If the inflammation becomes severe, it may cause shortness of breath. CAUSES  Inflammation may be caused by:  A virus.  Germs (bacteria).  Dust.  Allergens.  Pollutants and many other irritants. The cells lining the bronchial tree are covered with tiny hairs (cilia). These constantly beat upward, away from the lungs, toward the mouth. This keeps the lungs free of pollutants. When these cells become too irritated and are unable to do their job, mucus begins to develop. This causes the characteristic cough of bronchitis. The cough clears the lungs when the cilia are unable to do their job. Without either of these protective mechanisms, the mucus would settle in the lungs. Then you would develop pneumonia. Smoking is a common cause of bronchitis and can contribute to pneumonia. Stopping this habit is the single most important thing you can do to help yourself. TREATMENT   Your caregiver may prescribe an antibiotic if the cough is caused by bacteria. Also, medicines that open up your airways make it easier to breathe. Your caregiver may also recommend or prescribe an expectorant. It will loosen the mucus to be coughed up. Only take over-the-counter or prescription medicines for pain, discomfort, or fever as directed by your caregiver.  Removing whatever causes the problem (smoking, for example) is critical to preventing the problem from getting worse.  Cough suppressants may be prescribed for relief of cough symptoms.  Inhaled medicines may be prescribed to help with symptoms now and to help prevent problems from returning.  For those with recurrent (chronic) bronchitis, there may be a need  for steroid medicines. SEEK IMMEDIATE MEDICAL CARE IF:   During treatment, you develop more pus-like mucus (purulent sputum).  You have a fever.  You become progressively more ill.  You have increased difficulty breathing, wheezing, or shortness of breath. It is necessary to seek immediate medical care if you are elderly or sick from any other disease. MAKE SURE YOU:   Understand these instructions.  Will watch your condition.  Will get help right away if you are not doing well or get worse. Document Released: 12/04/2005 Document Revised: 08/06/2013 Document Reviewed: 07/29/2013 [facility] Patient Information 2014 [place], [place].

## 2013-11-03 ENCOUNTER — Encounter: Payer: Self-pay | Admitting: Family Medicine

## 2013-11-03 DIAGNOSIS — J40 Bronchitis, not specified as acute or chronic: Secondary | ICD-10-CM | POA: Insufficient documentation

## 2013-11-03 DIAGNOSIS — J209 Acute bronchitis, unspecified: Secondary | ICD-10-CM

## 2013-11-03 DIAGNOSIS — J45901 Unspecified asthma with (acute) exacerbation: Secondary | ICD-10-CM | POA: Insufficient documentation

## 2013-11-03 HISTORY — DX: Acute bronchitis, unspecified: J20.9

## 2013-11-03 NOTE — Progress Notes (Signed)
Patient ID: Alicia Cox, female   DOB: 1971-11-20, 42 y.o.   MRN: [id number] Alicia Cox [id number] December 29, 1970 11/03/2013      Progress Note-Follow Up  Subjective  Chief Complaint  Chief Complaint  Patient presents with  . Cough    X 1 week - white (thick) sometimes- mostly clear    HPI  Patient is a 42 year old female who is in today complaining of cough. Has been struggling with cough for several days and is worse. Knee difficult to sleep sometimes productive sometimes not productive clear to green phlegm is noted. Malaise and myalgias are also noted as well as head congestion.  Past Medical History  Diagnosis Date  . Vitamin D deficiency   . Arthritis   . Blood in stool   . Chicken pox as a child  . Frequent headaches   . GERD (gastroesophageal reflux disease)   . Allergy     hay fever  . Heart murmur   . UTI (urinary tract infection)   . Hypothyroidism     Hashimoto's  . Migraine   . Chest pain 08/24/2013  . TOS (thoracic outlet syndrome) 08/25/2013  . Acute bronchitis 11/03/2013    Past Surgical History  Procedure Laterality Date  . Tonsillectomy    . Cholecystectomy    . Appendectomy    . Endometrial ablation    . Tubal ligation  1999  . Cesarean section      twice    Family History  Problem Relation Age of Onset  . Hyperlipidemia Mother   . Hypertension Mother   . Diabetes Father     type 2  . Hypertension Sister   . Diabetes Sister   . Asthma Daughter   . Heart disease Maternal Grandmother   . Heart attack Maternal Grandfather   . Diabetes Paternal Grandmother   . Diabetes Paternal Grandfather     History   Social History  . Marital Status: Married    Spouse Name: N/A    Number of Children: N/A  . Years of Education: N/A   Occupational History  . Not on file.   Social History Main Topics  . Smoking status: Never Smoker   . Smokeless tobacco: Never Used  . Alcohol Use: No  . Drug Use: No  . Sexual Activity: Yes   Partners: Male   Other Topics Concern  . Not on file   Social History Narrative  . No narrative on file    Current Outpatient Prescriptions on File Prior to Visit  Medication Sig Dispense Refill  . cholecalciferol (VITAMIN D) 1000 UNITS tablet Take 10,000 Units by mouth once a week.      . cyclobenzaprine (FLEXERIL) 10 MG tablet Take 1 tablet (10 mg total) by mouth at bedtime as needed for muscle spasms.  30 tablet  2  . levothyroxine (SYNTHROID, LEVOTHROID) 100 MCG tablet Take 1 tablet daily except 2 days a week take an extra 1/2 tablet.  34 tablet  2   No current facility-administered medications on file prior to visit.    No Known Allergies  Review of Systems  Review of Systems  Constitutional: Positive for malaise/fatigue. Negative for fever.  HENT: Negative for congestion.   Eyes: Negative for discharge.  Respiratory: Positive for cough and sputum production. Negative for shortness of breath.   Cardiovascular: Negative for chest pain, palpitations and leg swelling.  Gastrointestinal: Negative for nausea, abdominal pain and diarrhea.  Genitourinary: Negative for dysuria.  Musculoskeletal: Positive for myalgias. Negative  for falls.  Skin: Negative for rash.  Neurological: Negative for loss of consciousness and headaches.  Endo/Heme/Allergies: Negative for polydipsia.  Psychiatric/Behavioral: Negative for depression and suicidal ideas. The patient is not nervous/anxious and does not have insomnia.     Objective  BP 132/88  Pulse 71  Temp(Src) 98.1 F (36.7 C) (Oral)  Ht 5\' 4"  (1.626 m)  Wt 253 lb 1.3 oz (114.796 kg)  BMI 43.42 kg/m2  Physical Exam  Physical Exam  Constitutional: She is oriented to person, place, and time and well-developed, well-nourished, and in no distress. No distress.  HENT:  Head: Normocephalic and atraumatic.  Eyes: Conjunctivae are normal.  Neck: Neck supple. No thyromegaly present.  Cardiovascular: Normal rate, regular rhythm and  normal heart sounds.   No murmur heard. Pulmonary/Chest: Effort normal and breath sounds normal. She has no wheezes.  Abdominal: She exhibits no distension and no mass.  Musculoskeletal: She exhibits no edema.  Lymphadenopathy:    She has no cervical adenopathy.  Neurological: She is alert and oriented to person, place, and time.  Skin: Skin is warm and dry. No rash noted. She is not diaphoretic.  Psychiatric: Memory, affect and judgment normal.    Lab Results  Component Value Date   TSH 4.686* 10/21/2013   Lab Results  Component Value Date   WBC 10.4 08/22/2013   HGB 14.2 08/22/2013   HCT 41.7 08/22/2013   MCV 94.3 08/22/2013   PLT 344 08/22/2013   Lab Results  Component Value Date   CREATININE 0.62 08/22/2013   BUN 9 08/22/2013   NA 137 08/22/2013   K 3.9 08/22/2013   CL 102 08/22/2013   CO2 28 08/22/2013   Lab Results  Component Value Date   ALT 28 08/22/2013   AST 18 08/22/2013   ALKPHOS 58 08/22/2013   BILITOT 0.3 08/22/2013   Lab Results  Component Value Date   CHOL 171 08/22/2013   Lab Results  Component Value Date   HDL 40 08/22/2013   Lab Results  Component Value Date   LDLCALC 96 08/22/2013   Lab Results  Component Value Date   TRIG 174* 08/22/2013   Lab Results  Component Value Date   CHOLHDL 4.3 08/22/2013     Assessment & Plan  Hypothyroidism Continue levothyroxine  Acute bronchitis Given rx for Azithromycin, Hycodan. Increase hydration and rest, start probiotics

## 2013-11-03 NOTE — Assessment & Plan Note (Signed)
Continue levothyroxine 

## 2013-11-03 NOTE — Assessment & Plan Note (Signed)
Given rx for Azithromycin, Hycodan. Increase hydration and rest, start probiotics

## 2013-12-23 ENCOUNTER — Ambulatory Visit: Payer: BC Managed Care – PPO | Admitting: Family Medicine

## 2013-12-30 ENCOUNTER — Telehealth: Payer: Self-pay

## 2013-12-30 DIAGNOSIS — E039 Hypothyroidism, unspecified: Secondary | ICD-10-CM

## 2013-12-30 LAB — TSH: TSH: 2.436 u[IU]/mL (ref 0.350–4.500)

## 2013-12-30 LAB — T4, FREE: FREE T4: 1.22 ng/dL (ref 0.80–1.80)

## 2013-12-30 NOTE — Telephone Encounter (Signed)
Lab order placed.

## 2014-01-05 ENCOUNTER — Ambulatory Visit: Payer: BC Managed Care – PPO | Admitting: Family Medicine

## 2014-01-09 ENCOUNTER — Encounter: Payer: Self-pay | Admitting: Family Medicine

## 2014-01-09 ENCOUNTER — Ambulatory Visit (INDEPENDENT_AMBULATORY_CARE_PROVIDER_SITE_OTHER): Payer: BC Managed Care – PPO | Admitting: Family Medicine

## 2014-01-09 VITALS — BP 118/90 | HR 85 | Temp 98.7°F | Ht 64.0 in | Wt 258.1 lb

## 2014-01-09 DIAGNOSIS — K625 Hemorrhage of anus and rectum: Secondary | ICD-10-CM

## 2014-01-09 DIAGNOSIS — M25519 Pain in unspecified shoulder: Secondary | ICD-10-CM

## 2014-01-09 DIAGNOSIS — M129 Arthropathy, unspecified: Secondary | ICD-10-CM

## 2014-01-09 DIAGNOSIS — K921 Melena: Secondary | ICD-10-CM

## 2014-01-09 DIAGNOSIS — M1990 Unspecified osteoarthritis, unspecified site: Secondary | ICD-10-CM

## 2014-01-09 DIAGNOSIS — E039 Hypothyroidism, unspecified: Secondary | ICD-10-CM

## 2014-01-09 DIAGNOSIS — M25569 Pain in unspecified knee: Secondary | ICD-10-CM

## 2014-01-09 DIAGNOSIS — R109 Unspecified abdominal pain: Secondary | ICD-10-CM

## 2014-01-09 DIAGNOSIS — M25562 Pain in left knee: Secondary | ICD-10-CM

## 2014-01-09 HISTORY — DX: Pain in left knee: M25.562

## 2014-01-09 NOTE — Patient Instructions (Signed)
Probiotics daily such as Align, Digestive Advantage, Culturelle  Basic Carbohydrate Counting Basic carbohydrate counting is a way to plan meals. It is done by counting the amount of carbohydrate in foods. Foods that have carbohydrates are starches (grains, beans, starchy vegetables) and sweets. Eating carbohydrates increases blood glucose (sugar) levels. People with diabetes use carbohydrate counting to help keep their blood glucose at a normal level.  COUNTING CARBOHYDRATES IN FOODS The first step in counting carbohydrates is to learn how many carbohydrate servings you should have in every meal. A dietitian can plan this for you. After learning the amount of carbohydrates to include in your meal plan, you can start to choose the carbohydrate-containing foods you want to eat.  There are 2 ways to identify the amount of carbohydrates in the foods you eat.  Read the Nutrition Facts panel on food labels. You need 2 pieces of information from the Nutrition Facts panel to count carbohydrates this way:  Serving size.  Total carbohydrate (in grams). Decide how many servings you will be eating. If it is 1 serving, you will be eating the amount of carbohydrate listed on the panel. If you will be eating 2 servings, you will be eating double the amount of carbohydrate listed on the panel.   Learn serving sizes. A serving size of most carbohydrate-containing foods is about 15 grams (g). Listed below are single serving sizes of common carbohydrate-containing foods:  1 slice bread.   cup unsweetened, dry cereal.   cup hot cereal.   cup rice.   cup mashed potatoes.   cup pasta.  1 cup fresh fruit.   cup canned fruit.  1 cup milk (whole, 2%, or skim).   cup starchy vegetables (peas, corn, or potatoes). Counting carbohydrates this way is similar to looking on the Nutrition Facts panel. Decide how many servings you will eat first. Multiply the number of servings you eat by 15 g. For example,  if you have 2 cups of strawberries, you had 2 servings. That means you had 30 g of carbohydrate (2 servings x 15 g = 30 g). CALCULATING CARBOHYDRATES IN A MEAL Sample dinner  3 oz chicken breast.   cup brown rice.   cup corn.  1 cup fat-free milk.  1 cup strawberries with sugar-free whipped topping. Carbohydrate calculation First, identify the foods that contain carbohydrate:  Rice.  Corn.  Milk.  Strawberries. Calculate the number of servings eaten:  2 servings rice.  1 serving corn.  1 serving milk.  1 serving strawberries. Multiply the number of servings by 15 g:  2 servings rice x 15 g = 30 g.  1 serving corn x 15 g = 15 g.  1 serving milk x 15 g = 15 g.  1 serving strawberries x 15 g = 15 g. Add the amounts to find the total carbohydrates eaten: 30 g + 15 g + 15 g + 15 g = 75 g carbohydrate eaten at dinner. Document Released: 12/04/2005 Document Revised: 02/26/2012 Document Reviewed: 10/20/2011 [facility] Patient Information 2014 [employer], [place].

## 2014-01-09 NOTE — Progress Notes (Signed)
Pre visit review using our clinic review tool, if applicable. No additional management support is needed unless otherwise documented below in the visit note. 

## 2014-01-12 ENCOUNTER — Encounter: Payer: Self-pay | Admitting: Family Medicine

## 2014-01-12 NOTE — Assessment & Plan Note (Signed)
With bursitis in knee [facility]

## 2014-01-12 NOTE — Assessment & Plan Note (Signed)
And right shoulder pain encouraged topical treatments and referred to sports med for further consideration

## 2014-01-12 NOTE — Assessment & Plan Note (Signed)
With persistent reflux and diarrhea, encouaged probiotics and avoid offending foods, referred to GI

## 2014-01-12 NOTE — Progress Notes (Signed)
Patient ID: Alicia Cox, female   DOB: 24-May-1971, 43 y.o.   MRN: [id number] TATIONNA FULLARD [id number] [birth year] 01/12/2014      Progress Note-Follow Up  Subjective  Chief Complaint  Chief Complaint  Patient presents with  . Follow-up    2 month    HPI  Patient is a 43 year old female had numerous complaints. One is persistent left knee pain and right shoulder pain. Has been seen by orthopedics pain persists. No redness or warmth. No recent injury. Is also complaining of frequent loose stool per rectum over the past 2 weeks. Only has twice a week and she has significant cramping and nausea when this occurs. No abdominal pain at present. No chest pain, palpitations or shortness of  Past Medical History  Diagnosis Date  . Vitamin D deficiency   . Arthritis   . Blood in stool   . Chicken pox as a child  . Frequent headaches   . GERD (gastroesophageal reflux disease)   . Allergy     hay fever  . Heart murmur   . UTI (urinary tract infection)   . Hypothyroidism     Hashimoto's  . Migraine   . Chest pain 08/24/2013  . TOS (thoracic outlet syndrome) 08/25/2013  . Acute bronchitis 11/03/2013  . Knee pain, left 01/09/2014    Past Surgical History  Procedure Laterality Date  . Tonsillectomy    . Cholecystectomy    . Appendectomy    . Endometrial ablation    . Tubal ligation  1999  . Cesarean section      twice    Family History  Problem Relation Age of Onset  . Hyperlipidemia Mother   . Hypertension Mother   . Diabetes Father     type 2  . Hypertension Sister   . Diabetes Sister   . Asthma Daughter   . Heart disease Maternal Grandmother   . Heart attack Maternal Grandfather   . Diabetes Paternal Grandmother   . Diabetes Paternal Grandfather     History   Social History  . Marital Status: Married    Spouse Name: N/A    Number of Children: N/A  . Years of Education: N/A   Occupational History  . Not on file.   Social History Main Topics  . Smoking  status: Never Smoker   . Smokeless tobacco: Never Used  . Alcohol Use: No  . Drug Use: No  . Sexual Activity: Yes    Partners: Male   Other Topics Concern  . Not on file   Social History Narrative  . No narrative on file    Current Outpatient Prescriptions on File Prior to Visit  Medication Sig Dispense Refill  . cholecalciferol (VITAMIN D) 1000 UNITS tablet Take 10,000 Units by mouth once a week.      . cyclobenzaprine (FLEXERIL) 10 MG tablet Take 1 tablet (10 mg total) by mouth at bedtime as needed for muscle spasms.  30 tablet  2  . levothyroxine (SYNTHROID, LEVOTHROID) 100 MCG tablet Take 1 tablet daily except 2 days a week take an extra 1/2 tablet.  34 tablet  2   No current facility-administered medications on file prior to visit.    No Known Allergies  Review of Systems  Review of Systems  Constitutional: Negative for fever and malaise/fatigue.  HENT: Negative for congestion.   Eyes: Negative for discharge.  Respiratory: Negative for shortness of breath.   Cardiovascular: Negative for chest pain, palpitations and leg swelling.  Gastrointestinal:  Positive for heartburn, diarrhea and blood in stool. Negative for nausea, abdominal pain, constipation and melena.  Genitourinary: Negative for dysuria.  Musculoskeletal: Negative for falls.  Skin: Negative for rash.  Neurological: Negative for loss of consciousness and headaches.  Endo/Heme/Allergies: Negative for polydipsia.  Psychiatric/Behavioral: Negative for depression and suicidal ideas. The patient is not nervous/anxious and does not have insomnia.     Objective  BP 118/90  Pulse 85  Temp(Src) 98.7 F (37.1 C) (Oral)  Ht 5\' 4"  (1.626 m)  Wt 258 lb 1.3 oz (117.064 kg)  BMI 44.28 kg/m2  SpO2 97%  Physical Exam  Physical Exam  Constitutional: She is oriented to person, place, and time and well-developed, well-nourished, and in no distress. No distress.  HENT:  Head: Normocephalic and atraumatic.  Eyes:  Conjunctivae are normal.  Neck: Neck supple. No thyromegaly present.  Cardiovascular: Normal rate, regular rhythm and normal heart sounds.   No murmur heard. Pulmonary/Chest: Effort normal and breath sounds normal. She has no wheezes.  Abdominal: She exhibits no distension and no mass.  Musculoskeletal: She exhibits no edema.  Lymphadenopathy:    She has no cervical adenopathy.  Neurological: She is alert and oriented to person, place, and time.  Skin: Skin is warm and dry. No rash noted. She is not diaphoretic.  Psychiatric: Memory, affect and judgment normal.    Lab Results  Component Value Date   TSH 2.436 12/30/2013   Lab Results  Component Value Date   WBC 10.4 08/22/2013   HGB 14.2 08/22/2013   HCT 41.7 08/22/2013   MCV 94.3 08/22/2013   PLT 344 08/22/2013   Lab Results  Component Value Date   CREATININE 0.62 08/22/2013   BUN 9 08/22/2013   NA 137 08/22/2013   K 3.9 08/22/2013   CL 102 08/22/2013   CO2 28 08/22/2013   Lab Results  Component Value Date   ALT 28 08/22/2013   AST 18 08/22/2013   ALKPHOS 58 08/22/2013   BILITOT 0.3 08/22/2013   Lab Results  Component Value Date   CHOL 171 08/22/2013   Lab Results  Component Value Date   HDL 40 08/22/2013   Lab Results  Component Value Date   LDLCALC 96 08/22/2013   Lab Results  Component Value Date   TRIG 174* 08/22/2013   Lab Results  Component Value Date   CHOLHDL 4.3 08/22/2013     Assessment & Plan  Arthritis With bursitis in knee [facility]   Hypothyroidism Well treated on current dose of Levothyroxine  Blood in stool With persistent reflux and diarrhea, encouaged probiotics and avoid offending foods, referred to GI  Knee pain, left And right shoulder pain encouraged topical treatments and referred to sports med for further consideration

## 2014-01-12 NOTE — Assessment & Plan Note (Signed)
Well treated on current dose of Levothyroxine 

## 2014-01-14 ENCOUNTER — Ambulatory Visit (INDEPENDENT_AMBULATORY_CARE_PROVIDER_SITE_OTHER): Payer: BC Managed Care – PPO | Admitting: Family Medicine

## 2014-01-14 ENCOUNTER — Encounter: Payer: Self-pay | Admitting: Family Medicine

## 2014-01-14 ENCOUNTER — Other Ambulatory Visit (INDEPENDENT_AMBULATORY_CARE_PROVIDER_SITE_OTHER): Payer: BC Managed Care – PPO

## 2014-01-14 VITALS — BP 138/80 | HR 76 | Temp 98.5°F | Resp 18 | Wt 255.1 lb

## 2014-01-14 DIAGNOSIS — M25569 Pain in unspecified knee: Secondary | ICD-10-CM

## 2014-01-14 DIAGNOSIS — M75100 Unspecified rotator cuff tear or rupture of unspecified shoulder, not specified as traumatic: Secondary | ICD-10-CM

## 2014-01-14 DIAGNOSIS — M25562 Pain in left knee: Secondary | ICD-10-CM

## 2014-01-14 DIAGNOSIS — M7550 Bursitis of unspecified shoulder: Secondary | ICD-10-CM | POA: Insufficient documentation

## 2014-01-14 DIAGNOSIS — IMO0002 Reserved for concepts with insufficient information to code with codable children: Secondary | ICD-10-CM

## 2014-01-14 MED ORDER — MELOXICAM 15 MG PO TABS
15.0000 mg | ORAL_TABLET | Freq: Every day | ORAL | Status: DC
Start: 2014-01-14 — End: 2014-11-05

## 2014-01-14 NOTE — Progress Notes (Signed)
I'm seeing this patient by the request  of:  Danise EdgeBLYTH, STACEY, MD   CC: knee and shoulder pain  HPI: Patient is a pleasant 43 year old female coming in with 2 different problems. Left knee pain.  Patient states he has had this pain for quite some time. Patient has been evaluated by an orthopedic physician previously. It has not tried an injection but did try some anti-inflammatories with no help. Patient does have an over-the-counter brace she wears is some activity. Patient has gained weight over the years and does at this has exacerbated the problem. Patient denies any mechanical symptoms such as clicking locking or giving out on her. Pain is worse when she is going upstairs. No numbness or any radiation of pain down the leg. Patient is a severity of 5/10.  Right shoulder pain- right shoulder pain is more of a insidious onset over the course of multiple months. Patient does not remember any injury but is worse with overhead activity. Patient states it is more of a dull aching pain that give her a sharp pain with certain movements. Patient denies any numbness, any radiation of the arm or any weakness. The pain in severity is 4/10   Past medical, surgical, family and social history reviewed. Medications reviewed all in the electronic medical record.   Review of Systems: No headache, visual changes, nausea, vomiting, diarrhea, constipation, dizziness, abdominal pain, skin rash, fevers, chills, night sweats, weight loss, swollen lymph nodes, body aches, joint swelling, muscle aches, chest pain, shortness of breath, mood changes.   Objective:    Blood pressure 138/80, pulse 76, temperature 98.5 F (36.9 C), temperature source Oral, resp. rate 18, weight 255 lb 1.9 oz (115.722 kg), SpO2 94.00%.   General: No apparent distress alert and oriented x3 mood and affect normal, dressed appropriately. Overweight HEENT: Pupils equal, extraocular movements intact Respiratory: Patient's speak in full sentences  and does not appear short of breath Cardiovascular: No lower extremity edema, non tender, no erythema Skin: Warm dry intact with no signs of infection or rash on extremities or on axial skeleton. Abdomen: Soft nontender Neuro: Cranial nerves II through XII are intact, neurovascularly intact in all extremities with 2+ DTRs and 2+ pulses. Lymph: No lymphadenopathy of posterior or anterior cervical chain or axillae bilaterally.  Gait normal with good balance and coordination.  MSK: Non tender with full range of motion and good stability and symmetric strength and tone of elbows, wrist, hip,  and ankles bilaterally.  Shoulder:right Inspection reveals no abnormalities, atrophy or asymmetry. Palpation is normal with no tenderness over AC joint or bicipital groove. ROM is full in all planes. Rotator cuff strength normal throughout. signs of impingement with positiveNeer and Hawkin's tests,  And empty can sign. Speeds and Yergason's tests normal. No labral pathology noted with negative Obrien's, negative clunk and good stability. Normal scapular function observed. No painful arc and no drop arm sign. No apprehension sign Contralateral shoulder unremarkable  Knee:left knee.  Normal to inspection with no erythema or effusion or obvious bony abnormalities. Palpation normal with no warmth, joint line tenderness, patellar tenderness, or condyle tenderness. ROM full in flexion and extension and lower leg rotation. Ligaments with solid consistent endpoints including ACL, PCL, LCL, MCL. Mild positive Mcmurray's, Apley's, and Thessalonian tests. Non painful patellar compression. Patellar glide without crepitus. Patellar and quadriceps tendons unremarkable. Hamstring and quadriceps strength is normal.  Contralateral knee unremarkable Impresison. Degenerative meniscal tear   Procedure: Real-time Ultrasound Guided Injection of right glenohumeral joint Device: GE Logiq  E  Ultrasound guided injection  is preferred based studies that show increased duration, increased effect, greater accuracy, decreased procedural pain, increased response rate with ultrasound guided versus blind injection.  Verbal informed consent obtained.  Time-out conducted.  Noted no overlying erythema, induration, or other signs of local infection.  Skin prepped in a sterile fashion.  Local anesthesia: Topical Ethyl chloride.  With sterile technique and under real time ultrasound guidance:  Joint visualized.  23g 1  inch needle inserted posterior approach. Pictures taken for needle placement. Patient did have injection of 2 cc of 1% lidocaine, 2 cc of 0.5% Marcaine, and 1.0 cc of Kenalog 40 mg/dL. Completed without difficulty  Pain immediately resolved suggesting accurate placement of the medication.  Advised to call if fevers/chills, erythema, induration, drainage, or persistent bleeding.  Images permanently stored and available for review in the ultrasound unit.  Impression: Technically successful ultrasound guided injection.   Impression and Recommendations:     This case required medical decision making of moderate complexity.

## 2014-01-14 NOTE — Progress Notes (Signed)
Pre-visit discussion using our clinic review tool. No additional management support is needed unless otherwise documented below in the visit note.  

## 2014-01-14 NOTE — Patient Instructions (Signed)
Very nice to meet you  Try meloxicam daily for 10 days then as needed.  Ice 20 minutes daily to knee and shoulder Exercises most days of the week Come back in 3 weeks.

## 2014-01-14 NOTE — Assessment & Plan Note (Signed)
Patient was given an injection today and tolerated the procedure very well with near complete resolution of pain. Patient given home exercise program which she will do on a regular basis. In addition to this she was given theraband.  Medications per orders Patient will followup in 3 weeks for further evaluation. He continues to be painful we'll need to consider formal physical therapy.

## 2014-01-14 NOTE — Assessment & Plan Note (Signed)
Patient does have what appears to be a degenerative meniscal tear but no significant hypoechoic changes surrounding the area. Patient given home exercise program, discussed bracing as well as icing protocol. Patient like to continue with conservative therapy and will follow up in 4 weeks. If she continues to have pain then we will do injection into the knee.

## 2014-02-04 ENCOUNTER — Ambulatory Visit: Payer: BC Managed Care – PPO | Admitting: Family Medicine

## 2014-02-14 ENCOUNTER — Other Ambulatory Visit: Payer: Self-pay | Admitting: Family Medicine

## 2014-02-18 ENCOUNTER — Ambulatory Visit: Payer: BC Managed Care – PPO | Admitting: Family Medicine

## 2014-03-04 ENCOUNTER — Encounter: Payer: Self-pay | Admitting: Family Medicine

## 2014-03-04 ENCOUNTER — Ambulatory Visit (INDEPENDENT_AMBULATORY_CARE_PROVIDER_SITE_OTHER): Payer: BC Managed Care – PPO | Admitting: Family Medicine

## 2014-03-04 VITALS — BP 138/88 | HR 87

## 2014-03-04 DIAGNOSIS — M25562 Pain in left knee: Secondary | ICD-10-CM

## 2014-03-04 DIAGNOSIS — M25569 Pain in unspecified knee: Secondary | ICD-10-CM

## 2014-03-04 NOTE — Progress Notes (Signed)
  I'm seeing this patient by the request  of:  BLYTH, Misty StanleySTACEY, MD   CC: knee and shoulder pain follow up.   HPI: Patient is a pleasant 43 year old female coming in with 2 different problems follow up  Left knee pain.  Only about 20% better, doing exercises. Tried icing, still having the pain on daily basis, no new symptoms, maybe sleeping a little better at night.   Right shoulder pain- Patietn had injection at last exam and states that the pain has completely resolved. Denies any radiation of pain.  No weakness, able to do all ADL's   Past medical, surgical, family and social history reviewed. Medications reviewed all in the electronic medical record.   Review of Systems: No headache, visual changes, nausea, vomiting, diarrhea, constipation, dizziness, abdominal pain, skin rash, fevers, chills, night sweats, weight loss, swollen lymph nodes, body aches, joint swelling, muscle aches, chest pain, shortness of breath, mood changes.   Objective:    Blood pressure 138/88, pulse 87, SpO2 96.00%.   General: No apparent distress alert and oriented x3 mood and affect normal, dressed appropriately. Overweight HEENT: Pupils equal, extraocular movements intact Respiratory: Patient's speak in full sentences and does not appear short of breath Cardiovascular: No lower extremity edema, non tender, no erythema Skin: Warm dry intact with no signs of infection or rash on extremities or on axial skeleton. Abdomen: Soft nontender Neuro: Cranial nerves II through XII are intact, neurovascularly intact in all extremities with 2+ DTRs and 2+ pulses. Lymph: No lymphadenopathy of posterior or anterior cervical chain or axillae bilaterally.  Gait normal with good balance and coordination.  MSK: Non tender with full range of motion and good stability and symmetric strength and tone of elbows, wrist, hip,  and ankles bilaterally.  Shoulder:right Inspection reveals no abnormalities, atrophy or  asymmetry. Palpation is normal with no tenderness over AC joint or bicipital groove. ROM is full in all planes. Rotator cuff strength normal throughout. Impingement signs negative.  Speeds and Yergason's tests normal. No labral pathology noted with negative Obrien's, negative clunk and good stability. Normal scapular function observed. No painful arc and no drop arm sign. No apprehension sign Contralateral shoulder unremarkable   Left Knee exam still + mcmurry and TTP medial joint line but full ROM and all ligaments intact.  After informed written consent patient was lying supine on exam table.  Left knee was prepped with iodine and alcohol swab.  Utilizing anteriormedialapproach, 3 mL of marcaine was used for local anesthesia.   Knee was then injected with 3:1 marcaine:Kenalog.  Patient tolerated procedure well without immediate complications, post injection instrucitons given.    Impression and Recommendations:     This case required medical decision making of moderate complexity.

## 2014-03-04 NOTE — Patient Instructions (Signed)
You are doing great.  Ice 20 minutes after a lot of walking [place].  Meloxicam or ibuprofen.  Continue exercises 3 times a week.  Come back in 3 weeks  Have a great trip.

## 2014-03-04 NOTE — Assessment & Plan Note (Signed)
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 Did injection to day with improvement, hope it will continue.  Continue exercsies, icing and discussed proper shoewear.  Patient is walking a lot on vacation so discussed taking breaks avoid twisting motions.  Will come back in 3-4 weeks for further evaluation.

## 2014-03-25 ENCOUNTER — Ambulatory Visit (INDEPENDENT_AMBULATORY_CARE_PROVIDER_SITE_OTHER): Payer: BC Managed Care – PPO | Admitting: Family Medicine

## 2014-03-25 ENCOUNTER — Encounter: Payer: Self-pay | Admitting: Family Medicine

## 2014-03-25 VITALS — BP 132/86 | HR 86

## 2014-03-25 DIAGNOSIS — M25562 Pain in left knee: Secondary | ICD-10-CM

## 2014-03-25 DIAGNOSIS — M25569 Pain in unspecified knee: Secondary | ICD-10-CM

## 2014-03-25 NOTE — Assessment & Plan Note (Signed)
Patient is doing better. Patient given other exercises today and to increase activity as tolerated. We discussed proper shoe and over-the-counter orthotics he can be helpful. Patient try these different things and come back again in 3-4 weeks for further evaluation. Spent greater than 25 minutes with patient face-to-face and had greater than 50% of counseling including as described above in assessment and plan.

## 2014-03-25 NOTE — Progress Notes (Signed)
  CC: knee  Follow up.   HPI: Patient is a pleasant 43 year old female coming in followup of left knee pain Left knee pain.  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 Patient did have a steroid injection and states that she is approximately 65% better. Patient states that there is some clicking still but this does not cause pain. Denies any giving out on her and is able to walk without any significant discomfort. Patient states at the end of a long day or she does significant amount of activity he can be more uncomfortable. Patient has started back to the gym which has been helpful but also causes some discomfort. Patient has been wearing her brace fairly regularly and continuing vitamin D. No new symptoms.     Past medical, surgical, family and social history reviewed. Medications reviewed all in the electronic medical record.   Review of Systems: No headache, visual changes, nausea, vomiting, diarrhea, constipation, dizziness, abdominal pain, skin rash, fevers, chills, night sweats, weight loss, swollen lymph nodes, body aches, joint swelling, muscle aches, chest pain, shortness of breath, mood changes.   Objective:    Blood pressure 132/86, pulse 86, SpO2 98.00%.   General: No apparent distress alert and oriented x3 mood and affect normal, dressed appropriately. Overweight HEENT: Pupils equal, extraocular movements intact Respiratory: Patient's speak in full sentences and does not appear short of breath Cardiovascular: No lower extremity edema, non tender, no erythema Skin: Warm dry intact with no signs of infection or rash on extremities or on axial skeleton. Abdomen: Soft nontender Neuro: Cranial nerves II through XII are intact, neurovascularly intact in all extremities with 2+ DTRs and 2+ pulses. Lymph: No lymphadenopathy of posterior or anterior cervical chain or axillae bilaterally.  Gait normal with good balance and coordination.  MSK: Non tender  with full range of motion and good stability and symmetric strength and tone of elbows, wrist, hip,  and ankles bilaterally.    Left Knee still + mcmurry and nontender TP medial joint line and full ROM and all ligaments intact.  .    Impression and Recommendations:     This case required medical decision making of moderate complexity.

## 2014-03-25 NOTE — Patient Instructions (Signed)
Good to see you [employer] at [employer] or online.  Ice 20 minutes at end of a lot of activity or long days.  Exercises 3 times a week and start elliptical 2 times a week.  If in 2 weeks feeling good can try treadmill In 4 weeks if still doing well do whatever you want.  At 4 weeks if not perfect come back again and we can discuss further.

## 2014-05-12 ENCOUNTER — Ambulatory Visit (INDEPENDENT_AMBULATORY_CARE_PROVIDER_SITE_OTHER): Payer: BC Managed Care – PPO | Admitting: Family Medicine

## 2014-05-12 ENCOUNTER — Encounter: Payer: Self-pay | Admitting: Family Medicine

## 2014-05-12 VITALS — BP 124/64 | HR 78 | Temp 98.2°F | Ht 64.0 in | Wt 256.1 lb

## 2014-05-12 DIAGNOSIS — M25562 Pain in left knee: Secondary | ICD-10-CM

## 2014-05-12 DIAGNOSIS — K219 Gastro-esophageal reflux disease without esophagitis: Secondary | ICD-10-CM

## 2014-05-12 DIAGNOSIS — R51 Headache: Secondary | ICD-10-CM

## 2014-05-12 DIAGNOSIS — R079 Chest pain, unspecified: Secondary | ICD-10-CM

## 2014-05-12 DIAGNOSIS — R739 Hyperglycemia, unspecified: Secondary | ICD-10-CM

## 2014-05-12 DIAGNOSIS — E785 Hyperlipidemia, unspecified: Secondary | ICD-10-CM

## 2014-05-12 DIAGNOSIS — M25569 Pain in unspecified knee: Secondary | ICD-10-CM

## 2014-05-12 DIAGNOSIS — K921 Melena: Secondary | ICD-10-CM

## 2014-05-12 DIAGNOSIS — E039 Hypothyroidism, unspecified: Secondary | ICD-10-CM

## 2014-05-12 DIAGNOSIS — F411 Generalized anxiety disorder: Secondary | ICD-10-CM

## 2014-05-12 DIAGNOSIS — R519 Headache, unspecified: Secondary | ICD-10-CM

## 2014-05-12 DIAGNOSIS — R7309 Other abnormal glucose: Secondary | ICD-10-CM

## 2014-05-12 MED ORDER — LEVOTHYROXINE SODIUM 100 MCG PO TABS: 100.0000 ug | ORAL_TABLET | Freq: Every day | ORAL | Status: DC

## 2014-05-12 MED ORDER — ALPRAZOLAM 0.25 MG PO TABS: 0.2500 mg | ORAL_TABLET | Freq: Two times a day (BID) | ORAL | Status: DC | PRN

## 2014-05-12 MED ORDER — ESCITALOPRAM OXALATE 10 MG PO TABS: ORAL_TABLET | ORAL | Status: DC

## 2014-05-12 NOTE — Patient Instructions (Signed)
DASH Diet  The DASH diet stands for "Dietary Approaches to Stop Hypertension." It is a healthy eating plan that has been shown to reduce high blood pressure (hypertension) in as little as 14 days, while also possibly providing other significant health benefits. These other health benefits include reducing the risk of breast cancer after menopause and reducing the risk of type 2 diabetes, heart disease, colon cancer, and stroke. Health benefits also include weight loss and slowing kidney failure in patients with chronic kidney disease.   DIET GUIDELINES  · Limit salt (sodium). Your diet should contain less than 1500 mg of sodium daily.  · Limit refined or processed carbohydrates. Your diet should include mostly whole grains. Desserts and added sugars should be used sparingly.  · Include small amounts of heart-healthy fats. These types of fats include nuts, oils, and tub margarine. Limit saturated and trans fats. These fats have been shown to be harmful in the body.  CHOOSING FOODS   The following food groups are based on a 2000 calorie diet. See your Registered Dietitian for individual calorie needs.  Grains and Grain Products (6 to 8 servings daily)  · Eat More Often: Whole-wheat bread, brown rice, whole-grain or wheat pasta, quinoa, popcorn without added fat or salt (air popped).  · Eat Less Often: White bread, white pasta, white rice, cornbread.  Vegetables (4 to 5 servings daily)  · Eat More Often: Fresh, frozen, and canned vegetables. Vegetables may be raw, steamed, roasted, or grilled with a minimal amount of fat.  · Eat Less Often/Avoid: Creamed or fried vegetables. Vegetables in a cheese sauce.  Fruit (4 to 5 servings daily)  · Eat More Often: All fresh, canned (in natural juice), or frozen fruits. Dried fruits without added sugar. One hundred percent fruit juice (½ cup [237 mL] daily).  · Eat Less Often: Dried fruits with added sugar. Canned fruit in light or heavy syrup.  Lean Meats, Fish, and Poultry (2  servings or less daily. One serving is 3 to 4 oz [85-114 g]).  · Eat More Often: Ninety percent or leaner ground beef, tenderloin, sirloin. Round cuts of beef, chicken breast, turkey breast. All fish. Grill, bake, or broil your meat. Nothing should be fried.  · Eat Less Often/Avoid: Fatty cuts of meat, turkey, or chicken leg, thigh, or wing. Fried cuts of meat or fish.  Dairy (2 to 3 servings)  · Eat More Often: Low-fat or fat-free milk, low-fat plain or light yogurt, reduced-fat or part-skim cheese.  · Eat Less Often/Avoid: Milk (whole, 2%). Whole milk yogurt. Full-fat cheeses.  Nuts, Seeds, and Legumes (4 to 5 servings per week)  · Eat More Often: All without added salt.  · Eat Less Often/Avoid: Salted nuts and seeds, canned beans with added salt.  Fats and Sweets (limited)  · Eat More Often: Vegetable oils, tub margarines without trans fats, sugar-free gelatin. Mayonnaise and salad dressings.  · Eat Less Often/Avoid: Coconut oils, palm oils, butter, stick margarine, cream, half and half, cookies, candy, pie.  FOR MORE INFORMATION  The Dash Diet Eating Plan: www.dashdiet.org  Document Released: 11/23/2011 Document Revised: 02/26/2012 Document Reviewed: 11/23/2011  [employer]® Patient Information ©2014 [employer].

## 2014-05-12 NOTE — Progress Notes (Signed)
Patient ID: Alicia Cox, female   DOB: 22-Oct-1971, 43 y.o.   MRN: [id number] MINHA FULCO [id number] August 21, 1971 05/12/2014      Progress Note-Follow Up  Subjective  Chief Complaint  Chief Complaint  Patient presents with  . Follow-up    4 month    HPI  Patient is a 42 year old female in today for routine medical care. In today for followup. Generally doing well. He does acknowledge some intermittent symptoms. She has noted for 2-3 months now some intermittent episodes of chest discomfort. Tends to occur in the evening often after eating but not always. She notes the symptoms lasted 10 minutes. They occur by themselves and sometimes she will have some shortness or breath with the symptoms. She denies palpitation, nausea vomiting. She's had no recent illness. She denies insomnia but she notes snoring and headaches often in the morning. Has a long history of roughly 12 years of daily headaches.  Past Medical History  Diagnosis Date  . Vitamin D deficiency   . Arthritis   . Blood in stool   . Chicken pox as a child  . Frequent headaches   . GERD (gastroesophageal reflux disease)   . Allergy     hay fever  . Heart murmur   . UTI (urinary tract infection)   . Hypothyroidism     Hashimoto's  . Migraine   . Chest pain 08/24/2013  . TOS (thoracic outlet syndrome) 08/25/2013  . Acute bronchitis 11/03/2013  . Knee pain, left 01/09/2014    Past Surgical History  Procedure Laterality Date  . Tonsillectomy    . Cholecystectomy    . Appendectomy    . Endometrial ablation    . Tubal ligation  1999  . Cesarean section      twice    Family History  Problem Relation Age of Onset  . Hyperlipidemia Mother   . Hypertension Mother   . Diabetes Father     type 2  . Hypertension Sister   . Diabetes Sister   . Asthma Daughter   . Heart disease Maternal Grandmother   . Heart attack Maternal Grandfather   . Diabetes Paternal Grandmother   . Diabetes Paternal Grandfather      History   Social History  . Marital Status: Married    Spouse Name: N/A    Number of Children: N/A  . Years of Education: N/A   Occupational History  . Not on file.   Social History Main Topics  . Smoking status: Never Smoker   . Smokeless tobacco: Never Used  . Alcohol Use: No  . Drug Use: No  . Sexual Activity: Yes    Partners: Male   Other Topics Concern  . Not on file   Social History Narrative  . No narrative on file    Current Outpatient Prescriptions on File Prior to Visit  Medication Sig Dispense Refill  . cholecalciferol (VITAMIN D) 1000 UNITS tablet Take 10,000 Units by mouth once a week.      . cyclobenzaprine (FLEXERIL) 10 MG tablet Take 1 tablet (10 mg total) by mouth at bedtime as needed for muscle spasms.  30 tablet  2  . levothyroxine (SYNTHROID, LEVOTHROID) 100 MCG tablet TAKE ONE TABLET BY MOUTH ONCE DAILY (EXCEPT TWO  DAYS A WEEK TAKE AN EXTRA ONE-HALF TABLET)  34 tablet  2  . meloxicam (MOBIC) 15 MG tablet Take 1 tablet (15 mg total) by mouth daily.  30 tablet  2   No  current facility-administered medications on file prior to visit.    No Known Allergies  Review of Systems  Review of Systems  Constitutional: Positive for malaise/fatigue. Negative for fever.  HENT: Negative for congestion.   Eyes: Negative for discharge.  Respiratory: Positive for shortness of breath.   Cardiovascular: Positive for chest pain. Negative for palpitations and leg swelling.  Gastrointestinal: Negative for nausea, abdominal pain and diarrhea.  Genitourinary: Negative for dysuria.  Musculoskeletal: Negative for falls.  Skin: Negative for rash.  Neurological: Negative for loss of consciousness and headaches.  Endo/Heme/Allergies: Negative for polydipsia.  Psychiatric/Behavioral: Negative for depression and suicidal ideas. The patient is nervous/anxious. The patient does not have insomnia.     Objective  BP 124/64  Pulse 78  Temp(Src) 98.2 F (36.8 C)  (Oral)  Ht 5\' 4"  (1.626 m)  Wt 256 lb 1.9 oz (116.175 kg)  BMI 43.94 kg/m2  SpO2 98%  Physical Exam  Physical Exam  Constitutional: She is oriented to person, place, and time and well-developed, well-nourished, and in no distress. No distress.  HENT:  Head: Normocephalic and atraumatic.  Eyes: Conjunctivae are normal.  Neck: Neck supple. No thyromegaly present.  Cardiovascular: Normal rate, regular rhythm and normal heart sounds.   No murmur heard. Pulmonary/Chest: Effort normal and breath sounds normal. She has no wheezes.  Abdominal: She exhibits no distension and no mass.  Musculoskeletal: She exhibits no edema.  Lymphadenopathy:    She has no cervical adenopathy.  Neurological: She is alert and oriented to person, place, and time.  Skin: Skin is warm and dry. No rash noted. She is not diaphoretic.  Psychiatric: Memory, affect and judgment normal.    Lab Results  Component Value Date   TSH 2.436 12/30/2013   Lab Results  Component Value Date   WBC 10.4 08/22/2013   HGB 14.2 08/22/2013   HCT 41.7 08/22/2013   MCV 94.3 08/22/2013   PLT 344 08/22/2013   Lab Results  Component Value Date   CREATININE 0.62 08/22/2013   BUN 9 08/22/2013   NA 137 08/22/2013   K 3.9 08/22/2013   CL 102 08/22/2013   CO2 28 08/22/2013   Lab Results  Component Value Date   ALT 28 08/22/2013   AST 18 08/22/2013   ALKPHOS 58 08/22/2013   BILITOT 0.3 08/22/2013   Lab Results  Component Value Date   CHOL 171 08/22/2013   Lab Results  Component Value Date   HDL 40 08/22/2013   Lab Results  Component Value Date   LDLCALC 96 08/22/2013   Lab Results  Component Value Date   TRIG 174* 08/22/2013   Lab Results  Component Value Date   CHOLHDL 4.3 08/22/2013     Assessment & Plan  Hypothyroidism On Levothyroxine, continue to monitor, increase Levothyroxine to daily  GERD (gastroesophageal reflux disease) Avoid offending foods, start probiotics. Do not eat large meals in late evening and consider raising head of  bed.   Other and unspecified hyperlipidemia Encouraged heart healthy diet, increase exercise, avoid trans fats, consider a krill oil cap daily  Hyperglycemia hgba1c acceptable, minimize simple carbs. Increase exercise as tolerated  Blood in stool Has had colonoscopy with [facility] in [place] and no concerns ientified.   Knee pain, left Referred to sports med for further evaluation due to persistence of pain  Atypical chest pain She notes it occurs with stress and eating but can also occur randomly. She chooses not to proceed with referral at this time. Will seek  immediate care if symptoms return and do not resolve.

## 2014-05-12 NOTE — Progress Notes (Signed)
Pre visit review using our clinic review tool, if applicable. No additional management support is needed unless otherwise documented below in the visit note. 

## 2014-05-15 ENCOUNTER — Telehealth: Payer: Self-pay | Admitting: Family Medicine

## 2014-05-15 LAB — CBC
HCT: 42.8 % (ref 36.0–46.0)
HEMOGLOBIN: 15.2 g/dL — AB (ref 12.0–15.0)
MCH: 33.7 pg (ref 26.0–34.0)
MCHC: 35.5 g/dL (ref 30.0–36.0)
MCV: 94.9 fL (ref 78.0–100.0)
Platelets: 330 10*3/uL (ref 150–400)
RBC: 4.51 MIL/uL (ref 3.87–5.11)
RDW: 12.8 % (ref 11.5–15.5)
WBC: 9.8 10*3/uL (ref 4.0–10.5)

## 2014-05-15 LAB — LIPID PANEL
Cholesterol: 163 mg/dL (ref 0–200)
HDL: 44 mg/dL (ref >39)
LDL CALC: 93 mg/dL (ref 0–99)
Total CHOL/HDL Ratio: 3.7 Ratio
Triglycerides: 129 mg/dL (ref <150)
VLDL: 26 mg/dL (ref 0–40)

## 2014-05-15 LAB — RENAL FUNCTION PANEL
Albumin: 4 g/dL (ref 3.5–5.2)
BUN: 8 mg/dL (ref 6–23)
CHLORIDE: 103 meq/L (ref 96–112)
CO2: 24 mEq/L (ref 19–32)
CREATININE: 0.6 mg/dL (ref 0.50–1.10)
Calcium: 8.7 mg/dL (ref 8.4–10.5)
GLUCOSE: 103 mg/dL — AB (ref 70–99)
POTASSIUM: 4.1 meq/L (ref 3.5–5.3)
Phosphorus: 2.8 mg/dL (ref 2.3–4.6)
Sodium: 136 mEq/L (ref 135–145)

## 2014-05-15 LAB — HEPATIC FUNCTION PANEL
ALK PHOS: 54 U/L (ref 39–117)
ALT: 18 U/L (ref 0–35)
AST: 13 U/L (ref 0–37)
Albumin: 4 g/dL (ref 3.5–5.2)
BILIRUBIN DIRECT: 0.1 mg/dL (ref 0.0–0.3)
BILIRUBIN INDIRECT: 0.4 mg/dL (ref 0.2–1.2)
Total Bilirubin: 0.5 mg/dL (ref 0.2–1.2)
Total Protein: 6.7 g/dL (ref 6.0–8.3)

## 2014-05-15 LAB — HEMOGLOBIN A1C
HEMOGLOBIN A1C: 5.6 % (ref <5.7)
Mean Plasma Glucose: 114 mg/dL (ref <117)

## 2014-05-15 NOTE — Telephone Encounter (Signed)
Received medical records from [facility]

## 2014-05-16 LAB — TSH: TSH: 2.195 u[IU]/mL (ref 0.350–4.500)

## 2014-05-16 LAB — T4, FREE: FREE T4: 1.23 ng/dL (ref 0.80–1.80)

## 2014-05-17 DIAGNOSIS — F411 Generalized anxiety disorder: Secondary | ICD-10-CM | POA: Insufficient documentation

## 2014-05-17 DIAGNOSIS — R739 Hyperglycemia, unspecified: Secondary | ICD-10-CM | POA: Insufficient documentation

## 2014-05-17 DIAGNOSIS — E782 Mixed hyperlipidemia: Secondary | ICD-10-CM | POA: Insufficient documentation

## 2014-05-17 NOTE — Assessment & Plan Note (Signed)
She notes it occurs with stress and eating but can also occur randomly. She chooses not to proceed with referral at this time. Will seek immediate care if symptoms return and do not resolve.

## 2014-05-17 NOTE — Assessment & Plan Note (Signed)
hgba1c acceptable, minimize simple carbs. Increase exercise as tolerated.  

## 2014-05-17 NOTE — Assessment & Plan Note (Signed)
Referred to sports med for further evaluation due to persistence of pain

## 2014-05-17 NOTE — Assessment & Plan Note (Signed)
Avoid offending foods, start probiotics. Do not eat large meals in late evening and consider raising head of bed.  

## 2014-05-17 NOTE — Assessment & Plan Note (Signed)
Encouraged heart healthy diet, increase exercise, avoid trans fats, consider a krill oil cap daily 

## 2014-05-17 NOTE — Assessment & Plan Note (Signed)
On Levothyroxine, continue to monitor, increase Levothyroxine to daily

## 2014-05-17 NOTE — Assessment & Plan Note (Signed)
Has had colonoscopy with [facility] in [place] and no concerns ientified.

## 2014-05-19 ENCOUNTER — Other Ambulatory Visit: Payer: Self-pay

## 2014-05-19 DIAGNOSIS — E039 Hypothyroidism, unspecified: Secondary | ICD-10-CM

## 2014-05-19 MED ORDER — LEVOTHYROXINE SODIUM 100 MCG PO TABS: 100.0000 ug | ORAL_TABLET | Freq: Every day | ORAL | Status: DC

## 2014-05-20 ENCOUNTER — Other Ambulatory Visit: Payer: Self-pay | Admitting: Family Medicine

## 2014-08-14 ENCOUNTER — Ambulatory Visit (INDEPENDENT_AMBULATORY_CARE_PROVIDER_SITE_OTHER): Payer: BC Managed Care – PPO | Admitting: Family Medicine

## 2014-08-14 ENCOUNTER — Encounter: Payer: Self-pay | Admitting: Family Medicine

## 2014-08-14 VITALS — BP 110/72 | HR 65 | Temp 97.9°F | Ht 64.0 in | Wt 258.0 lb

## 2014-08-14 DIAGNOSIS — R7309 Other abnormal glucose: Secondary | ICD-10-CM

## 2014-08-14 DIAGNOSIS — F411 Generalized anxiety disorder: Secondary | ICD-10-CM

## 2014-08-14 DIAGNOSIS — E039 Hypothyroidism, unspecified: Secondary | ICD-10-CM

## 2014-08-14 DIAGNOSIS — K219 Gastro-esophageal reflux disease without esophagitis: Secondary | ICD-10-CM

## 2014-08-14 DIAGNOSIS — E785 Hyperlipidemia, unspecified: Secondary | ICD-10-CM

## 2014-08-14 DIAGNOSIS — R739 Hyperglycemia, unspecified: Secondary | ICD-10-CM

## 2014-08-14 DIAGNOSIS — Z23 Encounter for immunization: Secondary | ICD-10-CM

## 2014-08-14 LAB — CBC
HCT: 43.8 % (ref 36.0–46.0)
HEMOGLOBIN: 15.5 g/dL — AB (ref 12.0–15.0)
MCH: 32.8 pg (ref 26.0–34.0)
MCHC: 35.4 g/dL (ref 30.0–36.0)
MCV: 92.8 fL (ref 78.0–100.0)
Platelets: 332 10*3/uL (ref 150–400)
RBC: 4.72 MIL/uL (ref 3.87–5.11)
RDW: 12.4 % (ref 11.5–15.5)
WBC: 8.5 10*3/uL (ref 4.0–10.5)

## 2014-08-14 LAB — LIPID PANEL
CHOL/HDL RATIO: 4 ratio
Cholesterol: 169 mg/dL (ref 0–200)
HDL: 42 mg/dL (ref >39)
LDL Cholesterol: 100 mg/dL — ABNORMAL HIGH (ref 0–99)
TRIGLYCERIDES: 137 mg/dL (ref <150)
VLDL: 27 mg/dL (ref 0–40)

## 2014-08-14 LAB — RENAL FUNCTION PANEL
Albumin: 4.4 g/dL (ref 3.5–5.2)
BUN: 7 mg/dL (ref 6–23)
CO2: 26 meq/L (ref 19–32)
Calcium: 9.4 mg/dL (ref 8.4–10.5)
Chloride: 103 mEq/L (ref 96–112)
Creat: 0.66 mg/dL (ref 0.50–1.10)
Glucose, Bld: 97 mg/dL (ref 70–99)
Phosphorus: 2.5 mg/dL (ref 2.3–4.6)
Potassium: 4.1 mEq/L (ref 3.5–5.3)
SODIUM: 140 meq/L (ref 135–145)

## 2014-08-14 LAB — HEPATIC FUNCTION PANEL
ALT: 30 U/L (ref 0–35)
AST: 18 U/L (ref 0–37)
Albumin: 4.4 g/dL (ref 3.5–5.2)
Alkaline Phosphatase: 60 U/L (ref 39–117)
Bilirubin, Direct: 0.1 mg/dL (ref 0.0–0.3)
Indirect Bilirubin: 0.3 mg/dL (ref 0.2–1.2)
Total Bilirubin: 0.4 mg/dL (ref 0.2–1.2)
Total Protein: 7.7 g/dL (ref 6.0–8.3)

## 2014-08-14 LAB — TSH: TSH: 1.159 u[IU]/mL (ref 0.350–4.500)

## 2014-08-14 MED ORDER — ESCITALOPRAM OXALATE 10 MG PO TABS: 10.0000 mg | ORAL_TABLET | Freq: Every day | ORAL | Status: DC

## 2014-08-14 MED ORDER — LEVOTHYROXINE SODIUM 100 MCG PO TABS: ORAL_TABLET | ORAL | Status: DC

## 2014-08-14 NOTE — Patient Instructions (Signed)
[employer], NOW herbal products. Garcinia  DASH Eating Plan DASH stands for "Dietary Approaches to Stop Hypertension." The DASH eating plan is a healthy eating plan that has been shown to reduce high blood pressure (hypertension). Additional health benefits may include reducing the risk of type 2 diabetes mellitus, heart disease, and stroke. The DASH eating plan may also help with weight loss. WHAT DO I NEED TO KNOW ABOUT THE DASH EATING PLAN? For the DASH eating plan, you will follow these general guidelines:  Choose foods with a percent daily value for sodium of less than 5% (as listed on the food label).  Use salt-free seasonings or herbs instead of table salt or sea salt.  Check with your health care provider or pharmacist before using salt substitutes.  Eat lower-sodium products, often labeled as "lower sodium" or "no salt added."  Eat fresh foods.  Eat more vegetables, fruits, and low-fat dairy products.  Choose whole grains. Look for the word "whole" as the first word in the ingredient list.  Choose fish and skinless chicken or [place] more often than red meat. Limit fish, poultry, and meat to 6 oz (170 g) each day.  Limit sweets, desserts, sugars, and sugary drinks.  Choose heart-healthy fats.  Limit cheese to 1 oz (28 g) per day.  Eat more home-cooked food and less restaurant, buffet, and fast food.  Limit fried foods.  Cook foods using methods other than frying.  Limit canned vegetables. If you do use them, rinse them well to decrease the sodium.  When eating at a restaurant, ask that your food be prepared with less salt, or no salt if possible. WHAT FOODS CAN I EAT? Seek help from a dietitian for individual calorie needs. Grains Whole grain or whole wheat bread. Brown rice. Whole grain or whole wheat pasta. Quinoa, bulgur, and whole grain cereals. Low-sodium cereals. Corn or whole wheat flour tortillas. Whole grain cornbread. Whole grain crackers. Low-sodium  crackers. Vegetables Fresh or frozen vegetables (raw, steamed, roasted, or grilled). Low-sodium or reduced-sodium tomato and vegetable juices. Low-sodium or reduced-sodium tomato sauce and paste. Low-sodium or reduced-sodium canned vegetables.  Fruits All fresh, canned (in natural juice), or frozen fruits. Meat and Other Protein Products Ground beef (85% or leaner), grass-fed beef, or beef trimmed of fat. Skinless chicken or [place]. Ground chicken or [place]. Pork trimmed of fat. All fish and seafood. Eggs. Dried beans, peas, or lentils. Unsalted nuts and seeds. Unsalted canned beans. Dairy Low-fat dairy products, such as skim or 1% milk, 2% or reduced-fat cheeses, low-fat ricotta or cottage cheese, or plain low-fat yogurt. Low-sodium or reduced-sodium cheeses. Fats and Oils Tub margarines without trans fats. Light or reduced-fat mayonnaise and salad dressings (reduced sodium). Avocado. Safflower, olive, or canola oils. Natural peanut or almond butter. Other Unsalted popcorn and pretzels. The items listed above may not be a complete list of recommended foods or beverages. Contact your dietitian for more options. WHAT FOODS ARE NOT RECOMMENDED? Grains White bread. White pasta. White rice. Refined cornbread. Bagels and croissants. Crackers that contain trans fat. Vegetables Creamed or fried vegetables. Vegetables in a cheese sauce. Regular canned vegetables. Regular canned tomato sauce and paste. Regular tomato and vegetable juices. Fruits Dried fruits. Canned fruit in light or heavy syrup. Fruit juice. Meat and Other Protein Products Fatty cuts of meat. Ribs, chicken wings, bacon, sausage, bologna, salami, chitterlings, fatback, hot dogs, bratwurst, and packaged luncheon meats. Salted nuts and seeds. Canned beans with salt. Dairy Whole or 2% milk, cream, half-and-half, and cream  cheese. Whole-fat or sweetened yogurt. Full-fat cheeses or blue cheese. Nondairy creamers and whipped toppings.  Processed cheese, cheese spreads, or cheese curds. Condiments Onion and garlic salt, seasoned salt, table salt, and sea salt. Canned and packaged gravies. Worcestershire sauce. Tartar sauce. Barbecue sauce. Teriyaki sauce. Soy sauce, including reduced sodium. Steak sauce. Fish sauce. Oyster sauce. Cocktail sauce. Horseradish. Ketchup and mustard. Meat flavorings and tenderizers. Bouillon cubes. Hot sauce. Tabasco sauce. Marinades. Taco seasonings. Relishes. Fats and Oils Butter, stick margarine, lard, shortening, ghee, and bacon fat. Coconut, palm kernel, or palm oils. Regular salad dressings. Other Pickles and olives. Salted popcorn and pretzels. The items listed above may not be a complete list of foods and beverages to avoid. Contact your dietitian for more information. WHERE CAN I FIND MORE INFORMATION? National Heart, Lung, and Blood Institute: travelstabloid.com Document Released: 11/23/2011 Document Revised: 04/20/2014 Document Reviewed: 10/08/2013 [facility] Patient Information 2015 [place], [place]. This information is not intended to replace advice given to you by your health care provider. Make sure you discuss any questions you have with your health care provider.

## 2014-08-14 NOTE — Progress Notes (Signed)
Pre visit review using our clinic review tool, if applicable. No additional management support is needed unless otherwise documented below in the visit note. 

## 2014-08-15 LAB — HEMOGLOBIN A1C
HEMOGLOBIN A1C: 5.8 % — AB (ref <5.7)
Mean Plasma Glucose: 120 mg/dL — ABNORMAL HIGH (ref <117)

## 2014-08-16 ENCOUNTER — Encounter: Payer: Self-pay | Admitting: Family Medicine

## 2014-08-16 NOTE — Assessment & Plan Note (Signed)
On Levothyroxine, continue to monitor 

## 2014-08-16 NOTE — Progress Notes (Signed)
Patient ID: Alicia Cox, female   DOB: [birth year], 43 y.o.   MRN: [id number] JEWELIANA DUDGEON [id number] Sep 10, 1971 08/16/2014      Progress Note-Follow Up  Subjective  Chief Complaint  Chief Complaint  Patient presents with  . Follow-up    3 month  . Injections    flu    HPI  Patient is a 43 year old female in today for routine medical care. She is generally feeling well. She agrees to take the flu shot today. Does feel that the Escitalopram has been helpful. She feels less blue and can concentrate better. No recent illness. Denies CP/palp/SOB/HA/congestion/fevers/GI or GU c/o. Taking meds as prescribed  Past Medical History  Diagnosis Date  . Vitamin D deficiency   . Arthritis   . Blood in stool   . Chicken pox as a child  . Frequent headaches   . GERD (gastroesophageal reflux disease)   . Allergy     hay fever  . Heart murmur   . UTI (urinary tract infection)   . Hypothyroidism     Hashimoto's  . Migraine   . Chest pain 08/24/2013  . TOS (thoracic outlet syndrome) 08/25/2013  . Acute bronchitis 11/03/2013  . Knee pain, left 01/09/2014    Past Surgical History  Procedure Laterality Date  . Tonsillectomy    . Cholecystectomy    . Appendectomy    . Endometrial ablation    . Tubal ligation  1999  . Cesarean section      twice    Family History  Problem Relation Age of Onset  . Hyperlipidemia Mother   . Hypertension Mother   . Diabetes Father     type 2  . Hypertension Sister   . Diabetes Sister   . Asthma Daughter   . Heart disease Maternal Grandmother   . Heart attack Maternal Grandfather   . Diabetes Paternal Grandmother   . Diabetes Paternal Grandfather     History   Social History  . Marital Status: Married    Spouse Name: N/A    Number of Children: N/A  . Years of Education: N/A   Occupational History  . Not on file.   Social History Main Topics  . Smoking status: Never Smoker   . Smokeless tobacco: Never Used  . Alcohol Use: No  .  Drug Use: No  . Sexual Activity: Yes    Partners: Male   Other Topics Concern  . Not on file   Social History Narrative  . No narrative on file    Current Outpatient Prescriptions on File Prior to Visit  Medication Sig Dispense Refill  . ALPRAZolam (XANAX) 0.25 MG tablet Take 1 tablet (0.25 mg total) by mouth 2 (two) times daily as needed for anxiety.  20 tablet  0  . cholecalciferol (VITAMIN D) 1000 UNITS tablet Take 10,000 Units by mouth once a week.      . cyclobenzaprine (FLEXERIL) 10 MG tablet Take 1 tablet (10 mg total) by mouth at bedtime as needed for muscle spasms.  30 tablet  2  . meloxicam (MOBIC) 15 MG tablet Take 1 tablet (15 mg total) by mouth daily.  30 tablet  2   No current facility-administered medications on file prior to visit.    No Known Allergies  Review of Systems  Review of Systems  Constitutional: Negative for fever and malaise/fatigue.  HENT: Negative for congestion.   Eyes: Negative for discharge.  Respiratory: Negative for shortness of breath.   Cardiovascular:  Negative for chest pain, palpitations and leg swelling.  Gastrointestinal: Negative for nausea, abdominal pain and diarrhea.  Genitourinary: Negative for dysuria.  Musculoskeletal: Negative for falls.  Skin: Negative for rash.  Neurological: Negative for loss of consciousness and headaches.  Endo/Heme/Allergies: Negative for polydipsia.  Psychiatric/Behavioral: Negative for depression and suicidal ideas. The patient is not nervous/anxious and does not have insomnia.     Objective  BP 110/72  Pulse 65  Temp(Src) 97.9 F (36.6 C) (Oral)  Ht  (1.626 m)  Wt 258 lb (117.028 kg)  BMI 44.26 kg/m2  SpO2 95%  Physical Exam  Physical Exam  Constitutional: She is oriented to person, place, and time and well-developed, well-nourished, and in no distress. No distress.  HENT:  Head: Normocephalic and atraumatic.  Eyes: Conjunctivae are normal.  Neck: Neck supple. No thyromegaly  present.  Cardiovascular: Normal rate, regular rhythm and normal heart sounds.   No murmur heard. Pulmonary/Chest: Effort normal and breath sounds normal. She has no wheezes.  Abdominal: She exhibits no distension and no mass.  Musculoskeletal: She exhibits no edema.  Lymphadenopathy:    She has no cervical adenopathy.  Neurological: She is alert and oriented to person, place, and time.  Skin: Skin is warm and dry. No rash noted. She is not diaphoretic.  Psychiatric: Memory, affect and judgment normal.    Lab Results  Component Value Date   TSH 1.159 08/14/2014   Lab Results  Component Value Date   WBC 8.5 08/14/2014   HGB 15.5* 08/14/2014   HCT 43.8 08/14/2014   MCV 92.8 08/14/2014   PLT 332 08/14/2014   Lab Results  Component Value Date   CREATININE 0.66 08/14/2014   BUN 7 08/14/2014   NA 140 08/14/2014   K 4.1 08/14/2014   CL 103 08/14/2014   CO2 26 08/14/2014   Lab Results  Component Value Date   ALT 30 08/14/2014   AST 18 08/14/2014   ALKPHOS 60 08/14/2014   BILITOT 0.4 08/14/2014   Lab Results  Component Value Date   CHOL 169 08/14/2014   Lab Results  Component Value Date   HDL 42 08/14/2014   Lab Results  Component Value Date   LDLCALC 100* 08/14/2014   Lab Results  Component Value Date   TRIG 137 08/14/2014   Lab Results  Component Value Date   CHOLHDL 4.0 08/14/2014     Assessment & Plan  GERD (gastroesophageal reflux disease) Avoid offending foods, start probiotics. Do not eat large meals in late evening and consider raising head of bed.   Hypothyroidism On Levothyroxine, continue to monitor  Hyperglycemia hgba1c acceptable, minimize simple carbs. Increase exercise as tolerated.

## 2014-08-16 NOTE — Assessment & Plan Note (Addendum)
hgba1c acceptable, minimize simple carbs. Increase exercise as tolerated.  

## 2014-08-16 NOTE — Assessment & Plan Note (Signed)
Avoid offending foods, start probiotics. Do not eat large meals in late evening and consider raising head of bed.  

## 2014-08-17 ENCOUNTER — Ambulatory Visit (INDEPENDENT_AMBULATORY_CARE_PROVIDER_SITE_OTHER): Payer: BC Managed Care – PPO | Admitting: Family Medicine

## 2014-08-17 ENCOUNTER — Ambulatory Visit (HOSPITAL_BASED_OUTPATIENT_CLINIC_OR_DEPARTMENT_OTHER)
Admission: RE | Admit: 2014-08-17 | Discharge: 2014-08-17 | Disposition: A | Discharge status: Home / Self Care | Payer: BC Managed Care – PPO | Source: Ambulatory Visit | Attending: Family Medicine | Admitting: Family Medicine

## 2014-08-17 ENCOUNTER — Encounter: Payer: Self-pay | Admitting: Family Medicine

## 2014-08-17 ENCOUNTER — Encounter (HOSPITAL_COMMUNITY): Payer: BC Managed Care – PPO

## 2014-08-17 VITALS — BP 110/70 | HR 70 | Temp 98.0°F | Ht 64.0 in | Wt 259.0 lb

## 2014-08-17 DIAGNOSIS — M79662 Pain in left lower leg: Secondary | ICD-10-CM

## 2014-08-17 DIAGNOSIS — M7989 Other specified soft tissue disorders: Secondary | ICD-10-CM | POA: Insufficient documentation

## 2014-08-17 DIAGNOSIS — M79609 Pain in unspecified limb: Secondary | ICD-10-CM | POA: Insufficient documentation

## 2014-08-17 DIAGNOSIS — M79669 Pain in unspecified lower leg: Secondary | ICD-10-CM | POA: Insufficient documentation

## 2014-08-17 NOTE — Progress Notes (Signed)
Pre visit review using our clinic review tool, if applicable. No additional management support is needed unless otherwise documented below in the visit note. 

## 2014-08-17 NOTE — Progress Notes (Signed)
Patient ID: Alicia Cox, female   DOB: 11/25/1971, 43 y.o.   MRN: [id number] Alicia Cox [id number] 11-26-1971 08/17/2014      Progress Note-Follow Up  Subjective  Chief Complaint  Chief Complaint  Patient presents with  . calf pain    left X 3 days (friday night) swelling, no redness, no fever, ibuprofen, ice or icy hot don't touch the pain    HPI  Patient is a 43 year old female in today for routine medical care. In today with a three-day history of increased left calf pain. She denies any trauma or long car ride. She's had pain in the calf as well as some swelling. No warmth or erythema. Denies CP/palp/SOB/HA/congestion/fevers/GI or GU c/o. Taking meds as prescribed. Has tried elevation, topical treatments for pain without improvement  Past Medical History  Diagnosis Date  . Vitamin D deficiency   . Arthritis   . Blood in stool   . Chicken pox as a child  . Frequent headaches   . GERD (gastroesophageal reflux disease)   . Allergy     hay fever  . Heart murmur   . UTI (urinary tract infection)   . Hypothyroidism     Hashimoto's  . Migraine   . Chest pain 08/24/2013  . TOS (thoracic outlet syndrome) 08/25/2013  . Acute bronchitis 11/03/2013  . Knee pain, left 01/09/2014    Past Surgical History  Procedure Laterality Date  . Tonsillectomy    . Cholecystectomy    . Appendectomy    . Endometrial ablation    . Tubal ligation  1999  . Cesarean section      twice    Family History  Problem Relation Age of Onset  . Hyperlipidemia Mother   . Hypertension Mother   . Diabetes Father     type 2  . Hypertension Sister   . Diabetes Sister   . Asthma Daughter   . Heart disease Maternal Grandmother   . Heart attack Maternal Grandfather   . Diabetes Paternal Grandmother   . Diabetes Paternal Grandfather     History   Social History  . Marital Status: Married    Spouse Name: N/A    Number of Children: N/A  . Years of Education: N/A   Occupational History   . Not on file.   Social History Main Topics  . Smoking status: Never Smoker   . Smokeless tobacco: Never Used  . Alcohol Use: No  . Drug Use: No  . Sexual Activity: Yes    Partners: Male   Other Topics Concern  . Not on file   Social History Narrative  . No narrative on file    Current Outpatient Prescriptions on File Prior to Visit  Medication Sig Dispense Refill  . ALPRAZolam (XANAX) 0.25 MG tablet Take 1 tablet (0.25 mg total) by mouth 2 (two) times daily as needed for anxiety.  20 tablet  0  . cholecalciferol (VITAMIN D) 1000 UNITS tablet Take 10,000 Units by mouth once a week.      . cyclobenzaprine (FLEXERIL) 10 MG tablet Take 1 tablet (10 mg total) by mouth at bedtime as needed for muscle spasms.  30 tablet  2  . escitalopram (LEXAPRO) 10 MG tablet Take 1 tablet (10 mg total) by mouth daily.  90 tablet  2  . levothyroxine (SYNTHROID, LEVOTHROID) 100 MCG tablet 1 tab po daily except 1 1/2 tabs po on Thursdays and Sundays  35 tablet  3  . meloxicam (MOBIC)  15 MG tablet Take 1 tablet (15 mg total) by mouth daily.  30 tablet  2   No current facility-administered medications on file prior to visit.    No Known Allergies  Review of Systems  Review of Systems  Constitutional: Negative for fever and malaise/fatigue.  HENT: Negative for congestion.   Eyes: Negative for discharge.  Respiratory: Negative for shortness of breath.   Cardiovascular: Negative for chest pain, palpitations and leg swelling.  Gastrointestinal: Negative for nausea, abdominal pain and diarrhea.  Genitourinary: Negative for dysuria.       Right calf pain and swelling x 3 days  Musculoskeletal: Positive for myalgias. Negative for falls.  Skin: Negative for rash.  Neurological: Negative for loss of consciousness and headaches.  Endo/Heme/Allergies: Negative for polydipsia.  Psychiatric/Behavioral: Negative for depression and suicidal ideas. The patient is not nervous/anxious and does not have  insomnia.     Objective  BP 110/70  Pulse 70  Temp(Src) 98 F (36.7 C) (Oral)  Ht  (1.626 m)  Wt 259 lb (117.482 kg)  BMI 44.44 kg/m2  SpO2 96%  Physical Exam  Physical Exam  Constitutional: She is oriented to person, place, and time and well-developed, well-nourished, and in no distress. No distress.  HENT:  Head: Normocephalic and atraumatic.  Eyes: Conjunctivae are normal.  Neck: Neck supple. No thyromegaly present.  Cardiovascular: Normal rate, regular rhythm and normal heart sounds.   No murmur heard. Pulmonary/Chest: Effort normal and breath sounds normal. She has no wheezes.  Abdominal: She exhibits no distension and no mass.  Musculoskeletal: She exhibits edema and tenderness.  Left calf swollen, no erythema or warmth, not tender to palp but positive Homan's sign, right calf neg  Lymphadenopathy:    She has no cervical adenopathy.  Neurological: She is alert and oriented to person, place, and time.  Skin: Skin is warm and dry. No rash noted. She is not diaphoretic.  Psychiatric: Memory, affect and judgment normal.    Lab Results  Component Value Date   TSH 1.159 08/14/2014   Lab Results  Component Value Date   WBC 8.5 08/14/2014   HGB 15.5* 08/14/2014   HCT 43.8 08/14/2014   MCV 92.8 08/14/2014   PLT 332 08/14/2014   Lab Results  Component Value Date   CREATININE 0.66 08/14/2014   BUN 7 08/14/2014   NA 140 08/14/2014   K 4.1 08/14/2014   CL 103 08/14/2014   CO2 26 08/14/2014   Lab Results  Component Value Date   ALT 30 08/14/2014   AST 18 08/14/2014   ALKPHOS 60 08/14/2014   BILITOT 0.4 08/14/2014   Lab Results  Component Value Date   CHOL 169 08/14/2014   Lab Results  Component Value Date   HDL 42 08/14/2014   Lab Results  Component Value Date   LDLCALC 100* 08/14/2014   Lab Results  Component Value Date   TRIG 137 08/14/2014   Lab Results  Component Value Date   CHOLHDL 4.0 08/14/2014     Assessment & Plan   Calf pain Left calf  painful and swollen x 3 days, no trauma or other concerns. Positive Homan's will proceed with ultrasound, if positive for DVT will start Xarelto if negative encouraged moist heat, elevation, Flexeril qhs for a few days and report worsening symptoms

## 2014-08-17 NOTE — Assessment & Plan Note (Signed)
Left calf painful and swollen x 3 days, no trauma or other concerns. Positive Homan's will proceed with ultrasound, if positive for DVT will start Xarelto if negative encouraged moist heat, elevation, Flexeril qhs for a few days and report worsening symptoms

## 2014-08-17 NOTE — Patient Instructions (Signed)

## 2014-08-26 ENCOUNTER — Encounter: Payer: Self-pay | Admitting: Family Medicine

## 2014-08-26 ENCOUNTER — Ambulatory Visit (INDEPENDENT_AMBULATORY_CARE_PROVIDER_SITE_OTHER): Payer: BC Managed Care – PPO | Admitting: Family Medicine

## 2014-08-26 ENCOUNTER — Other Ambulatory Visit (INDEPENDENT_AMBULATORY_CARE_PROVIDER_SITE_OTHER): Payer: BC Managed Care – PPO

## 2014-08-26 VITALS — BP 116/80 | HR 84 | Ht 64.0 in | Wt 259.0 lb

## 2014-08-26 DIAGNOSIS — S86819A Strain of other muscle(s) and tendon(s) at lower leg level, unspecified leg, initial encounter: Secondary | ICD-10-CM

## 2014-08-26 DIAGNOSIS — M79609 Pain in unspecified limb: Secondary | ICD-10-CM

## 2014-08-26 DIAGNOSIS — S86112A Strain of other muscle(s) and tendon(s) of posterior muscle group at lower leg level, left leg, initial encounter: Secondary | ICD-10-CM | POA: Insufficient documentation

## 2014-08-26 DIAGNOSIS — M79662 Pain in left lower leg: Secondary | ICD-10-CM

## 2014-08-26 DIAGNOSIS — S838X9A Sprain of other specified parts of unspecified knee, initial encounter: Secondary | ICD-10-CM

## 2014-08-26 NOTE — Progress Notes (Signed)
CC: knee  Follow up.   HPI: Patient is a pleasant 43 year old female coming in followup of left knee pain Left knee pain.  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 Patient did respond fairly well to an injection greater than 6 months ago. Patient was to do home exercises, icing, as well as bracing. Patient states.  But the knee is doing better overall. Patient unfortunately states that she is having left calf pain. Patient does not remember injuring 3. Patient was seen by primary care provider. This was 10 days ago. Does not remember any injury. Patient did have an ultrasound to rule out deep venous thrombosis which was negative. Patient states that since then she's been taking anti-inflammatories fairly regularly and has noticed a significant decrease in pain. Patient still has some mild discomfort when going uphill but otherwise feeling much better. Denies any radiation of pain or any numbness. Patient was the severity now has 4/10.   Past medical, surgical, family and social history reviewed. Medications reviewed all in the electronic medical record.   Review of Systems: No headache, visual changes, nausea, vomiting, diarrhea, constipation, dizziness, abdominal pain, skin rash, fevers, chills, night sweats, weight loss, swollen lymph nodes, body aches, joint swelling, muscle aches, chest pain, shortness of breath, mood changes.   Objective:    Blood pressure 116/80, pulse 84, height  (1.626 m), weight 259 lb (117.482 kg), SpO2 97.00%.   General: No apparent distress alert and oriented x3 mood and affect normal, dressed appropriately. Overweight HEENT: Pupils equal, extraocular movements intact Respiratory: Patient's speak in full sentences and does not appear short of breath Cardiovascular: No lower extremity edema, non tender, no erythema Skin: Warm dry intact with no signs of infection or rash on extremities or on axial skeleton. Abdomen: Soft  nontender Neuro: Cranial nerves II through XII are intact, neurovascularly intact in all extremities with 2+ DTRs and 2+ pulses. Lymph: No lymphadenopathy of posterior or anterior cervical chain or axillae bilaterally.  Gait normal with good balance and coordination.  MSK: Non tender with full range of motion and good stability and symmetric strength and tone of elbows, wrist, hip,  and ankles bilaterally.   Knee: Left Normal to inspection with no erythema or effusion or obvious bony abnormalities. Palpation normal with no warmth, joint line tenderness, patellar tenderness, or condyle tenderness. ROM full in flexion and extension and lower leg rotation. Ligaments with solid consistent endpoints including ACL, PCL, LCL, MCL. Negative Mcmurray's, Apley's, and Thessalonian tests. Non painful patellar compression. Patellar glide without crepitus. Patellar and quadriceps tendons unremarkable. Hamstring and quadriceps strength is normal.  Patient's lateral head of her gastrocnemius is minimally tender to palpation. There seems to be some mild fluid accumulation at the origin of the calf muscle.  MSK US performed of: Left knee This study was ordered, performed, and interpreted by Terrilee Files D.O.  Knee: All structures visualized. Anteromedial continued chronic degenerative changes but no true displacement, anterolateral, posteromedial, and posterolateral menisci unremarkable without tearing, fraying, effusion, or displacement. Patellar Tendon unremarkable on long and transverse views without effusion. No abnormality of prepatellar bursa. LCL and MCL unremarkable on long and transverse views. Patient's medial aspect of the lateral gastroc head at its origin does have some mild hypoechoic changes it seems to be nonspecific. No true cyst appreciated.  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     Impression and  Recommendations:  This case required medical decision making of moderate complexity.

## 2014-08-26 NOTE — Assessment & Plan Note (Signed)
Currently the patient has more of a gastrocnemius strain. Patient's Doppler was completely normal. Patient is improving just with oral anti-inflammatories at this time. Encourage her to continue that on a regular basis for the next 7-10 days. This did an icing regimen we discussed the possibility of compression sleeve as well as a heel lift that was given to her today. Patient then will start a home exercise program as well. Patient will come back and see me again in 2 weeks if not completely resolved.  Spent greater than 25 minutes with patient face-to-face and had greater than 50% of counseling including as described above in assessment and plan.

## 2014-08-26 NOTE — Patient Instructions (Signed)
Good to see you Continue the exercises Ice will always be your friend.  Do at end of day for 20 minutes Compression sleeve at [facility] or [employer] to the calf Exercises 3 times a week.  Heel lift for next 2 weeks.  Meloixcam daily for 10 days then as needed If not perfect comeback in 3 weeks and we will look closer.

## 2014-09-16 ENCOUNTER — Ambulatory Visit: Payer: BC Managed Care – PPO | Admitting: Family Medicine

## 2014-11-05 ENCOUNTER — Ambulatory Visit (HOSPITAL_BASED_OUTPATIENT_CLINIC_OR_DEPARTMENT_OTHER)
Admission: RE | Admit: 2014-11-05 | Discharge: 2014-11-05 | Disposition: A | Discharge status: Home / Self Care | Payer: BC Managed Care – PPO | Source: Ambulatory Visit | Attending: Medical | Admitting: Medical

## 2014-11-05 ENCOUNTER — Encounter: Payer: Self-pay | Admitting: Medical

## 2014-11-05 ENCOUNTER — Ambulatory Visit (INDEPENDENT_AMBULATORY_CARE_PROVIDER_SITE_OTHER): Payer: BC Managed Care – PPO | Admitting: Medical

## 2014-11-05 VITALS — BP 120/85 | HR 88 | Temp 98.2°F | Ht 64.0 in | Wt 255.8 lb

## 2014-11-05 DIAGNOSIS — R1032 Left lower quadrant pain: Secondary | ICD-10-CM | POA: Insufficient documentation

## 2014-11-05 DIAGNOSIS — I82412 Acute embolism and thrombosis of left femoral vein: Secondary | ICD-10-CM | POA: Diagnosis not present

## 2014-11-05 DIAGNOSIS — I82432 Acute embolism and thrombosis of left popliteal vein: Secondary | ICD-10-CM | POA: Insufficient documentation

## 2014-11-05 DIAGNOSIS — I82409 Acute embolism and thrombosis of unspecified deep veins of unspecified lower extremity: Secondary | ICD-10-CM | POA: Insufficient documentation

## 2014-11-05 DIAGNOSIS — M79605 Pain in left leg: Secondary | ICD-10-CM | POA: Insufficient documentation

## 2014-11-05 DIAGNOSIS — M25561 Pain in right knee: Secondary | ICD-10-CM

## 2014-11-05 DIAGNOSIS — I82402 Acute embolism and thrombosis of unspecified deep veins of left lower extremity: Secondary | ICD-10-CM

## 2014-11-05 MED ORDER — RIVAROXABAN 20 MG PO TABS: 20.0000 mg | ORAL_TABLET | Freq: Every day | ORAL | Status: DC

## 2014-11-05 NOTE — Assessment & Plan Note (Signed)
You do have a dvt. Present by doppler. I want you to start xarelto 15 mg sample tab twice daily for 21 days then start 20 mg tabs one time a day. I have given you the first 36 days of medicine. I also sent prescription to your pharmacy. Treatment period will be 3 months. I may refer you for further studies to work up cause of dvt.  If you get any chest pain or shortness of breath then go to the ED. If your leg swells further with increased pain then ED as well. Rest and elevation of  your leg may help with swelling.   Follow up in Monday or as needed UC or ED over the weekend. If you have any questions let us know/call.  If while on the xarelto you get dark black stools or bloody stools let us know.

## 2014-11-05 NOTE — Progress Notes (Signed)
Pre visit review using our clinic review tool, if applicable. No additional management support is needed unless otherwise documented below in the visit note. 

## 2014-11-05 NOTE — Progress Notes (Signed)
Subjective:    Patient ID: Alicia Cox, female    DOB: [birth year], 43 y.o.   MRN: [id number]  HPI   Pt in with left lower leg pain. No fall or trauma. Pain just when she woke up. She did not have any swelling this am but did swell earlier today. Pt lt  foot is not numb. She can move  Lt foot without any problem. No ocp. LMP- hx of ablation 8 yrs ago. No long travels. Pt does not smoke.   2 months ago she had doppler of left leg but no swelling.That doppler was negative.  No shortness of breath or wheezing. No chest pain Mild  Rare occassioal cough. Mild nasal congestion to go along with that and mild allergy symptoms  Past Medical History  Diagnosis Date  . Vitamin D deficiency   . Arthritis   . Blood in stool   . Chicken pox as a child  . Frequent headaches   . GERD (gastroesophageal reflux disease)   . Allergy     hay fever  . Heart murmur   . UTI (urinary tract infection)   . Hypothyroidism     Hashimoto's  . Migraine   . Chest pain 08/24/2013  . TOS (thoracic outlet syndrome) 08/25/2013  . Acute bronchitis 11/03/2013  . Knee pain, left 01/09/2014    History   Social History  . Marital Status: Married    Spouse Name: N/A    Number of Children: N/A  . Years of Education: N/A   Occupational History  . Not on file.   Social History Main Topics  . Smoking status: Never Smoker   . Smokeless tobacco: Never Used  . Alcohol Use: No  . Drug Use: No  . Sexual Activity:    Partners: Male   Other Topics Concern  . Not on file   Social History Narrative    Past Surgical History  Procedure Laterality Date  . Tonsillectomy    . Cholecystectomy    . Appendectomy    . Endometrial ablation    . Tubal ligation  1999  . Cesarean section      twice    Family History  Problem Relation Age of Onset  . Hyperlipidemia Mother   . Hypertension Mother   . Diabetes Father     type 2  . Hypertension Sister   . Diabetes Sister   . Asthma Daughter   . Heart  disease Maternal Grandmother   . Heart attack Maternal Grandfather   . Diabetes Paternal Grandmother   . Diabetes Paternal Grandfather     No Known Allergies  Current Outpatient Prescriptions on File Prior to Visit  Medication Sig Dispense Refill  . ALPRAZolam (XANAX) 0.25 MG tablet Take 1 tablet (0.25 mg total) by mouth 2 (two) times daily as needed for anxiety. 20 tablet 0  . cholecalciferol (VITAMIN D) 1000 UNITS tablet Take 10,000 Units by mouth once a week.    . cyclobenzaprine (FLEXERIL) 10 MG tablet Take 1 tablet (10 mg total) by mouth at bedtime as needed for muscle spasms. 30 tablet 2  . escitalopram (LEXAPRO) 10 MG tablet Take 1 tablet (10 mg total) by mouth daily. 90 tablet 2  . levothyroxine (SYNTHROID, LEVOTHROID) 100 MCG tablet 1 tab po daily except 1 1/2 tabs po on Thursdays and Sundays 35 tablet 3   No current facility-administered medications on file prior to visit.    BP 120/85 mmHg  Pulse 88  Temp(Src) 98.2 F (  36.8 C)  Ht 5\' 4"  (1.626 m)  Wt 255 lb 12.8 oz (116.03 kg)  BMI 43.89 kg/m2  SpO2 98%           Review of Systems  Constitutional: Negative for fever, chills and fatigue.  HENT: Positive for congestion and rhinorrhea. Negative for ear discharge, ear pain, nosebleeds, postnasal drip, sinus pressure, sore throat and trouble swallowing.   Respiratory: Positive for cough. Negative for chest tightness, shortness of breath and wheezing.        Rare occasional cough onset with mid nasal congestion.  Cardiovascular: Negative for chest pain and palpitations.  Gastrointestinal: Negative for nausea, vomiting, abdominal pain, diarrhea and constipation.  Musculoskeletal: Negative for back pain.       Leg pain. Left side with swellling.  Skin:       Normal color to left foot.  Neurological: Negative for weakness and numbness.       No numbness of left lower ext.  Hematological: Negative for adenopathy. Does not bruise/bleed easily.    Psychiatric/Behavioral: Negative for suicidal ideas, behavioral problems and dysphoric mood. The patient is not nervous/anxious.        Objective:   Physical Exam   General- No acute distress. Pleasant pt.  Neck- no jvd.  Lungs- clear even unlabored.  Heart- RRR  Lt leg-  Mid to upper calf 19 inch calf left side. Anterior aspect swollen and mild tender.Positive homans lt side  Rt side- mid to upper calf  16 inches. No warmth or tenderess.  Skin- color of leg looks normal.  Neuro- normal sensation. No numbness.       Assessment & Plan:

## 2014-11-05 NOTE — Patient Instructions (Addendum)
Please go down and get stat doppler of left leg in radiology dep. Please stay there until I am called with result.  You do have a dvt. Present by doppler. I want you to start xarelto 15 mg sample tab twice daily for 21 days then start 20 mg tabs one time a day. I have given you the first 36 days of medicine. I also sent prescription to your pharmacy. Treatment period will be 3 months. I may refer you for further studies to work up cause of dvt.  If you get any chest pain or shortness of breath then go to the ED. If your leg swells further with increased pain then ED as well. Rest and elevation of  your leg may help with swelling.   Follow up in Monday or as needed UC or ED over the weekend. If you have any questions let us know/call.  If while on the xarelto you get dark black stools or bloody stools let us know.

## 2014-11-06 ENCOUNTER — Telehealth: Payer: Self-pay | Admitting: Medical

## 2014-11-06 NOTE — Telephone Encounter (Signed)
I called pt today and she feels ok. Leg about the same not worse. Hx of endometrial ablation. Occasional spotting 1 day  very rare andrandom. Since no cause of dvt found I thought maybe remote chance pregnancy with ablation. Was going to recommend otc pregnancy test.  But pt informed me she also had her tubal ligation 16 yrs ago. She is 43 yo. So i did not think pregnancy test necessary/indicated.

## 2014-11-09 ENCOUNTER — Encounter: Payer: Self-pay | Admitting: Family Medicine

## 2014-11-09 ENCOUNTER — Ambulatory Visit (INDEPENDENT_AMBULATORY_CARE_PROVIDER_SITE_OTHER): Payer: BC Managed Care – PPO | Admitting: Family Medicine

## 2014-11-09 VITALS — BP 130/72 | HR 73 | Temp 97.9°F | Ht 64.0 in | Wt 251.6 lb

## 2014-11-09 DIAGNOSIS — E039 Hypothyroidism, unspecified: Secondary | ICD-10-CM

## 2014-11-09 DIAGNOSIS — T7840XS Allergy, unspecified, sequela: Secondary | ICD-10-CM

## 2014-11-09 DIAGNOSIS — E669 Obesity, unspecified: Secondary | ICD-10-CM

## 2014-11-09 DIAGNOSIS — I82402 Acute embolism and thrombosis of unspecified deep veins of left lower extremity: Secondary | ICD-10-CM

## 2014-11-09 DIAGNOSIS — R52 Pain, unspecified: Secondary | ICD-10-CM

## 2014-11-09 MED ORDER — HYDROCODONE-ACETAMINOPHEN 5-325 MG PO TABS: 1.0000 | ORAL_TABLET | Freq: Three times a day (TID) | ORAL | Status: DC | PRN

## 2014-11-09 NOTE — Patient Instructions (Signed)

## 2014-11-09 NOTE — Progress Notes (Signed)
Pre visit review using our clinic review tool, if applicable. No additional management support is needed unless otherwise documented below in the visit note. 

## 2014-11-10 ENCOUNTER — Telehealth: Payer: Self-pay | Admitting: Family Medicine

## 2014-11-10 NOTE — Telephone Encounter (Signed)
Caller name:Pennel,William Relation to pt: spouse  Call back number: [phone]   Reason for call:   PA prescribed rivaroxaban (XARELTO) 20 MG TABS tablet spouse states it's costly seeking advice from PCP regarding cheaper RX

## 2014-11-10 NOTE — Telephone Encounter (Addendum)
lvm advising spouse of MD instructions

## 2014-11-10 NOTE — Telephone Encounter (Signed)
So she can have the copay card, it is 0$ with [employer], I left in on your desk just let her know. Thanks

## 2014-11-10 NOTE — Telephone Encounter (Signed)
Please inform spouse of message below and card is at the front desk

## 2014-11-15 ENCOUNTER — Encounter: Payer: Self-pay | Admitting: Family Medicine

## 2014-11-15 DIAGNOSIS — E669 Obesity, unspecified: Secondary | ICD-10-CM

## 2014-11-15 HISTORY — DX: Obesity, unspecified: E66.9

## 2014-11-15 NOTE — Assessment & Plan Note (Signed)
Encouraged DASH diet, decrease po intake and increase exercise as tolerated. Needs 7-8 hours of sleep nightly. Avoid trans fats, eat small, frequent meals every 4-5 hours with lean proteins, complex carbs and healthy fats. Minimize simple carbs, GMO foods. 

## 2014-11-15 NOTE — Assessment & Plan Note (Signed)
Is struggling, encouraged to increase Zyrtec to bid and continue Flonase and nasal saline.

## 2014-11-15 NOTE — Assessment & Plan Note (Signed)
On Levothyroxine, continue to monitor 

## 2014-11-15 NOTE — Assessment & Plan Note (Signed)
Notes significant discomfort still. Encouraged no strenuous activity. Continue Xarelto may use pain meds prn and report significant changes.

## 2014-11-15 NOTE — Progress Notes (Signed)
Alicia KeyRebecca D Brunsman  [id number] 05-09-1971 11/15/2014      Progress Note-Follow Up  Subjective  Chief Complaint  Chief Complaint  Patient presents with  . Follow-up    from visit with Ramon Dredgedward    HPI  Patient is a 43 y.o. female in today for routine medical care. Patient notes persistent pain and swelling in her leg status post her DVT. It is worse after spending feet for long time. She does note her sister had a blood clot as well once she was on oral contraceptives. She is also struggling with some ongoing allergies and is using Zyrtec and Flonase with marginal improvement. No other recent illness. Denies CP/palp/SOB/HA/congestion/fevers/GI or GU c/o. Taking meds as prescribed  Past Medical History  Diagnosis Date  . Vitamin D deficiency   . Arthritis   . Blood in stool   . Chicken pox as a child  . Frequent headaches   . GERD (gastroesophageal reflux disease)   . Allergy     hay fever  . Heart murmur   . UTI (urinary tract infection)   . Hypothyroidism     Hashimoto's  . Migraine   . Chest pain 08/24/2013  . TOS (thoracic outlet syndrome) 08/25/2013  . Acute bronchitis 11/03/2013  . Knee pain, left 01/09/2014  . Obesity 11/15/2014    Past Surgical History  Procedure Laterality Date  . Tonsillectomy    . Cholecystectomy    . Appendectomy    . Endometrial ablation    . Tubal ligation  1999  . Cesarean section      twice    Family History  Problem Relation Age of Onset  . Hyperlipidemia Mother   . Hypertension Mother   . Diabetes Father     type 2  . Hypertension Sister   . Diabetes Sister   . Asthma Daughter   . Heart disease Maternal Grandmother   . Heart attack Maternal Grandfather   . Diabetes Paternal Grandmother   . Diabetes Paternal Grandfather     History   Social History  . Marital Status: Married    Spouse Name: N/A    Number of Children: N/A  . Years of Education: N/A   Occupational History  . Not on file.   Social History Main Topics   . Smoking status: Never Smoker   . Smokeless tobacco: Never Used  . Alcohol Use: No  . Drug Use: No  . Sexual Activity:    Partners: Male   Other Topics Concern  . Not on file   Social History Narrative    No Known Allergies  Review of Systems  Review of Systems  Constitutional: Negative for fever and malaise/fatigue.  HENT: Positive for congestion.   Eyes: Negative for discharge.  Respiratory: Negative for shortness of breath.   Cardiovascular: Negative for chest pain, palpitations and leg swelling.  Gastrointestinal: Negative for nausea, abdominal pain and diarrhea.  Genitourinary: Negative for dysuria.  Musculoskeletal: Positive for joint pain. Negative for falls.       Lower extremity pain and swelling  Skin: Negative for rash.  Neurological: Negative for loss of consciousness and headaches.  Endo/Heme/Allergies: Negative for polydipsia.  Psychiatric/Behavioral: Negative for depression and suicidal ideas. The patient is not nervous/anxious and does not have insomnia.     Objective  BP 130/72 mmHg  Pulse 73  Temp(Src) 97.9 F (36.6 C) (Oral)  Ht 5\' 4"  (1.626 m)  Wt 251 lb 9.6 oz (114.125 kg)  BMI 43.17 kg/m2  SpO2  98%  Physical Exam  Physical Exam  Constitutional: She is oriented to person, place, and time and well-developed, well-nourished, and in no distress. No distress.  HENT:  Head: Normocephalic and atraumatic.  Eyes: Conjunctivae are normal.  Neck: Neck supple. No thyromegaly present.  Cardiovascular: Normal rate, regular rhythm and normal heart sounds.   No murmur heard. Pulmonary/Chest: Effort normal and breath sounds normal. She has no wheezes.  Abdominal: She exhibits no distension and no mass.  Musculoskeletal: She exhibits no edema.  Lymphadenopathy:    She has no cervical adenopathy.  Neurological: She is alert and oriented to person, place, and time.  Skin: Skin is warm and dry. No rash noted. She is not diaphoretic.  Psychiatric:  Memory, affect and judgment normal.    Lab Results  Component Value Date   TSH 1.159 08/14/2014   Lab Results  Component Value Date   WBC 8.5 08/14/2014   HGB 15.5* 08/14/2014   HCT 43.8 08/14/2014   MCV 92.8 08/14/2014   PLT 332 08/14/2014   Lab Results  Component Value Date   CREATININE 0.66 08/14/2014   BUN 7 08/14/2014   NA 140 08/14/2014   K 4.1 08/14/2014   CL 103 08/14/2014   CO2 26 08/14/2014   Lab Results  Component Value Date   ALT 30 08/14/2014   AST 18 08/14/2014   ALKPHOS 60 08/14/2014   BILITOT 0.4 08/14/2014   Lab Results  Component Value Date   CHOL 169 08/14/2014   Lab Results  Component Value Date   HDL 42 08/14/2014   Lab Results  Component Value Date   LDLCALC 100* 08/14/2014   Lab Results  Component Value Date   TRIG 137 08/14/2014   Lab Results  Component Value Date   CHOLHDL 4.0 08/14/2014     Assessment & Plan  DVT of lower limb, acute Notes significant discomfort still. Encouraged no strenuous activity. Continue Xarelto may use pain meds prn and report significant changes.  Hypothyroidism On Levothyroxine, continue to monitor  Obesity Encouraged DASH diet, decrease po intake and increase exercise as tolerated. Needs 7-8 hours of sleep nightly. Avoid trans fats, eat small, frequent meals every 4-5 hours with lean proteins, complex carbs and healthy fats. Minimize simple carbs, GMO foods.  Allergy Is struggling, encouraged to increase Zyrtec to bid and continue Flonase and nasal saline.

## 2014-11-16 ENCOUNTER — Telehealth: Payer: Self-pay

## 2014-11-16 NOTE — Telephone Encounter (Signed)
[facility]  Triage Call Report Triage Record Num: [id number] Operator: Albertine GratesLori Steuer Patient Name: Alicia RavelingRebecca Cox Call Date & Time: 11/13/2014 8:25:47PM Patient Phone: [phone] PCP: Joaquin CourtsStacey Blythe Patient Gender: Female PCP Fax : [phone] Patient DOB: 01-14-1971 Practice Name: [facility]  Reason for Call: Caller: Williams/Spouse; PCP: Danise EdgeBlyth, Stacey (Family Practice); CB#: [phone]; Husband calling and states patient had swelling in foot 11-27. Thinks may have "over did it" 11-27 as was walking/shoppping all day. Denies pain. No discoloration. Swelling decreases after elevation. Has been diagnosed with blood clot 1 week ago and symptoms are not the same. Home care advice given per Ankle Non Injury.  Protocol(s) Used: Ankle Non-Injury Recommended Outcome per Protocol: Provide Home/Self Care Reason for Outcome: All other situations Care Advice: ~

## 2014-11-16 NOTE — Telephone Encounter (Signed)
Fyi.

## 2014-11-26 ENCOUNTER — Emergency Department (HOSPITAL_BASED_OUTPATIENT_CLINIC_OR_DEPARTMENT_OTHER)
Admission: EM | Admit: 2014-11-26 | Discharge: 2014-11-26 | Disposition: A | Discharge status: Home / Self Care | Payer: BC Managed Care – PPO | Attending: Emergency Medicine | Admitting: Emergency Medicine

## 2014-11-26 ENCOUNTER — Emergency Department (HOSPITAL_BASED_OUTPATIENT_CLINIC_OR_DEPARTMENT_OTHER): Payer: BC Managed Care – PPO

## 2014-11-26 ENCOUNTER — Encounter (HOSPITAL_BASED_OUTPATIENT_CLINIC_OR_DEPARTMENT_OTHER): Payer: Self-pay | Admitting: Emergency Medicine

## 2014-11-26 DIAGNOSIS — E669 Obesity, unspecified: Secondary | ICD-10-CM | POA: Diagnosis not present

## 2014-11-26 DIAGNOSIS — M1990 Unspecified osteoarthritis, unspecified site: Secondary | ICD-10-CM | POA: Insufficient documentation

## 2014-11-26 DIAGNOSIS — E559 Vitamin D deficiency, unspecified: Secondary | ICD-10-CM | POA: Diagnosis not present

## 2014-11-26 DIAGNOSIS — Z8619 Personal history of other infectious and parasitic diseases: Secondary | ICD-10-CM | POA: Diagnosis not present

## 2014-11-26 DIAGNOSIS — R011 Cardiac murmur, unspecified: Secondary | ICD-10-CM | POA: Diagnosis not present

## 2014-11-26 DIAGNOSIS — Z79899 Other long term (current) drug therapy: Secondary | ICD-10-CM | POA: Diagnosis not present

## 2014-11-26 DIAGNOSIS — Z8709 Personal history of other diseases of the respiratory system: Secondary | ICD-10-CM | POA: Insufficient documentation

## 2014-11-26 DIAGNOSIS — M79671 Pain in right foot: Secondary | ICD-10-CM | POA: Insufficient documentation

## 2014-11-26 DIAGNOSIS — Z8669 Personal history of other diseases of the nervous system and sense organs: Secondary | ICD-10-CM | POA: Insufficient documentation

## 2014-11-26 DIAGNOSIS — Z87440 Personal history of urinary (tract) infections: Secondary | ICD-10-CM | POA: Insufficient documentation

## 2014-11-26 DIAGNOSIS — Z8719 Personal history of other diseases of the digestive system: Secondary | ICD-10-CM | POA: Diagnosis not present

## 2014-11-26 DIAGNOSIS — G43909 Migraine, unspecified, not intractable, without status migrainosus: Secondary | ICD-10-CM | POA: Diagnosis not present

## 2014-11-26 DIAGNOSIS — Z7901 Long term (current) use of anticoagulants: Secondary | ICD-10-CM | POA: Diagnosis not present

## 2014-11-26 NOTE — ED Provider Notes (Signed)
CSN: [id number]     Arrival date & time 11/26/14  2119 History  This chart was scribed for Arby BarretteMarcy Duquan Gillooly, MD by Tonye RoyaltyJoshua Chen, ED Scribe. This patient was seen in room MHOTF/OTF and the patient's care was started at 10:39 PM.    Chief Complaint  Patient presents with  . Foot Pain    right    Patient is a 43 y.o. female presenting with lower extremity pain. The history is provided by the patient. No language interpreter was used.  Foot Pain This is a new problem. The current episode started 3 to 5 hours ago. The problem occurs constantly. The problem has not changed since onset.The symptoms are aggravated by walking, twisting and bending. Nothing relieves the symptoms. She has tried nothing for the symptoms.    HPI Comments: Alicia Cox is a 43 y.o. female who presents to the Emergency Department complaining of right foot pain with walking with onset this afternoon. She locates the pain to the dorsal aspect of her lateral arch. She states she has pain only with walking and certain movements of her foot. She denies recent injury or prior injury, but notes she had a prior DVT in her right leg. She states she wears shoes with inserts provided by her doctor that she has used for some time.  Past Medical History  Diagnosis Date  . Vitamin D deficiency   . Arthritis   . Blood in stool   . Chicken pox as a child  . Frequent headaches   . GERD (gastroesophageal reflux disease)   . Allergy     hay fever  . Heart murmur   . UTI (urinary tract infection)   . Hypothyroidism     Hashimoto's  . Migraine   . Chest pain 08/24/2013  . TOS (thoracic outlet syndrome) 08/25/2013  . Acute bronchitis 11/03/2013  . Knee pain, left 01/09/2014  . Obesity 11/15/2014   Past Surgical History  Procedure Laterality Date  . Tonsillectomy    . Cholecystectomy    . Appendectomy    . Endometrial ablation    . Tubal ligation  1999  . Cesarean section      twice   Family History  Problem Relation Age of  Onset  . Hyperlipidemia Mother   . Hypertension Mother   . Diabetes Father     type 2  . Hypertension Sister   . Diabetes Sister   . Asthma Daughter   . Heart disease Maternal Grandmother   . Heart attack Maternal Grandfather   . Diabetes Paternal Grandmother   . Diabetes Paternal Grandfather    History  Substance Use Topics  . Smoking status: Never Smoker   . Smokeless tobacco: Never Used  . Alcohol Use: No   OB History    No data available     Review of Systems 10 Systems reviewed and all are negative for acute change except as noted in the HPI.    Allergies  Review of patient's allergies indicates no known allergies.  Home Medications   Prior to Admission medications   Medication Sig Start Date End Date Taking? Authorizing Provider  ALPRAZolam (XANAX) 0.25 MG tablet Take 1 tablet (0.25 mg total) by mouth 2 (two) times daily as needed for anxiety. 05/12/14  Yes Bradd CanaryStacey A Blyth, MD  cholecalciferol (VITAMIN D) 1000 UNITS tablet Take 10,000 Units by mouth once a week.   Yes Historical Provider, MD  cyclobenzaprine (FLEXERIL) 10 MG tablet Take 1 tablet (10 mg total) by  mouth at bedtime as needed for muscle spasms. 10/21/13  Yes Bradd CanaryStacey A Blyth, MD  escitalopram (LEXAPRO) 10 MG tablet Take 1 tablet (10 mg total) by mouth daily. 08/14/14  Yes Bradd CanaryStacey A Blyth, MD  HYDROcodone-acetaminophen (NORCO) 5-325 MG per tablet Take 1 tablet by mouth 3 (three) times daily as needed for moderate pain. 11/09/14  Yes Bradd CanaryStacey A Blyth, MD  levothyroxine (SYNTHROID, LEVOTHROID) 100 MCG tablet 1 tab po daily except 1 1/2 tabs po on Thursdays and Sundays 08/14/14  Yes Bradd CanaryStacey A Blyth, MD  rivaroxaban (XARELTO) 20 MG TABS tablet Take 1 tablet (20 mg total) by mouth daily with supper. 11/05/14  Yes Edward M Saguier, PA-C   BP 127/57 mmHg  Pulse 64  Temp(Src) 98.3 F (36.8 C) (Oral)  Resp 18  Ht 5\' 4"  (1.626 m)  Wt 250 lb (113.399 kg)  BMI 42.89 kg/m2  SpO2 96% Physical Exam  Constitutional: She  appears well-developed and well-nourished. No distress.  Pulmonary/Chest: Effort normal.  Musculoskeletal: Normal range of motion. She exhibits no edema or tenderness.  area of pain is medial arch and forefoot. Examination normal without erythema or swelling. Normal DP. Feet symmetric. No calf swelling or tenderness.    ED Course  Procedures (including critical care time)  DIAGNOSTIC STUDIES: Oxygen Saturation is 100% on room air, normal by my interpretation.    COORDINATION OF CARE: 7:11 AM Discussed treatment plan with patient at beside, the patient agrees with the plan and has no further questions at this time.   Labs Review Labs Reviewed - No data to display  Imaging Review No results found.   EKG Interpretation None      MDM   Final diagnoses:  Foot pain, right    Patient's concern is for DVT. 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 No visible or palpable abnormality. Reccommended to follow up with her podiatrist.  Arby BarretteMarcy Izaac Reisig, MD 12/01/14 [phone]

## 2014-11-26 NOTE — ED Notes (Addendum)
43 yo female c/o right lateral sided foot pain. Pt reports moving around this afternoon stepped a certain way that resulted in pain. Reports Swelling and being on blood thinners for 3 weeks for previous DVT in left knee. NAD distress at this time unable to walk on foot.

## 2014-11-26 NOTE — Discharge Instructions (Signed)
Probable Foot Sprain The muscles and cord like structures which attach muscle to bone (tendons) that surround the feet are made up of units. A foot sprain can occur at the weakest spot in any of these units. This condition is most often caused by injury to or overuse of the foot, as from playing contact sports, or aggravating a previous injury, or from poor conditioning, or obesity. SYMPTOMS  Pain with movement of the foot.  Tenderness and swelling at the injury site.  Loss of strength is present in moderate or severe sprains. THE THREE GRADES OR SEVERITY OF FOOT SPRAIN ARE:  Mild (Grade I): Slightly pulled muscle without tearing of muscle or tendon fibers or loss of strength.  Moderate (Grade II): Tearing of fibers in a muscle, tendon, or at the attachment to bone, with small decrease in strength.  Severe (Grade III): Rupture of the muscle-tendon-bone attachment, with separation of fibers. Severe sprain requires surgical repair. Often repeating (chronic) sprains are caused by overuse. Sudden (acute) sprains are caused by direct injury or over-use. DIAGNOSIS  Diagnosis of this condition is usually by your own observation. If problems continue, a caregiver may be required for further evaluation and treatment. X-rays may be required to make sure there are not breaks in the bones (fractures) present. Continued problems may require physical therapy for treatment. PREVENTION  Use strength and conditioning exercises appropriate for your sport.  Warm up properly prior to working out.  Use athletic shoes that are made for the sport you are participating in.  Allow adequate time for healing. Early return to activities makes repeat injury more likely, and can lead to an unstable arthritic foot that can result in prolonged disability. Mild sprains generally heal in 3 to 10 days, with moderate and severe sprains taking 2 to 10 weeks. Your caregiver can help you determine the proper time required for  healing. HOME CARE INSTRUCTIONS   Apply ice to the injury for 15-20 minutes, 03-04 times per day. Put the ice in a plastic bag and place a towel between the bag of ice and your skin.  An elastic wrap (like an Ace bandage) may be used to keep swelling down.  Keep foot above the level of the heart, or at least raised on a footstool, when swelling and pain are present.  Try to avoid use other than gentle range of motion while the foot is painful. Do not resume use until instructed by your caregiver. Then begin use gradually, not increasing use to the point of pain. If pain does develop, decrease use and continue the above measures, gradually increasing activities that do not cause discomfort, until you gradually achieve normal use.  Use crutches if and as instructed, and for the length of time instructed.  Keep injured foot and ankle wrapped between treatments.  Massage foot and ankle for comfort and to keep swelling down. Massage from the toes up towards the knee.  Only take over-the-counter or prescription medicines for pain, discomfort, or fever as directed by your caregiver. SEEK IMMEDIATE MEDICAL CARE IF:   Your pain and swelling increase, or pain is not controlled with medications.  You have loss of feeling in your foot or your foot turns cold or blue.  You develop new, unexplained symptoms, or an increase of the symptoms that brought you to your caregiver. MAKE SURE YOU:   Understand these instructions.  Will watch your condition.  Will get help right away if you are not doing well or get worse. Document  Released: 05/26/2002 Document Revised: 02/26/2012 Document Reviewed: 07/23/2008 [facility] Patient Information 2015 [place], [employer]. This information is not intended to replace advice given to you by your health care provider. Make sure you discuss any questions you have with your health care provider.

## 2014-12-07 ENCOUNTER — Ambulatory Visit: Payer: BC Managed Care – PPO | Admitting: Family Medicine

## 2014-12-10 ENCOUNTER — Encounter: Payer: Self-pay | Admitting: Family Medicine

## 2014-12-10 ENCOUNTER — Ambulatory Visit (INDEPENDENT_AMBULATORY_CARE_PROVIDER_SITE_OTHER): Payer: BC Managed Care – PPO | Admitting: Family Medicine

## 2014-12-10 VITALS — BP 126/76 | HR 76 | Temp 97.9°F | Ht 64.0 in | Wt 252.8 lb

## 2014-12-10 DIAGNOSIS — E669 Obesity, unspecified: Secondary | ICD-10-CM

## 2014-12-10 DIAGNOSIS — R1013 Epigastric pain: Secondary | ICD-10-CM

## 2014-12-10 DIAGNOSIS — K219 Gastro-esophageal reflux disease without esophagitis: Secondary | ICD-10-CM

## 2014-12-10 DIAGNOSIS — I82402 Acute embolism and thrombosis of unspecified deep veins of left lower extremity: Secondary | ICD-10-CM

## 2014-12-10 DIAGNOSIS — G43809 Other migraine, not intractable, without status migrainosus: Secondary | ICD-10-CM

## 2014-12-10 DIAGNOSIS — D582 Other hemoglobinopathies: Secondary | ICD-10-CM

## 2014-12-10 DIAGNOSIS — R739 Hyperglycemia, unspecified: Secondary | ICD-10-CM

## 2014-12-10 DIAGNOSIS — R5382 Chronic fatigue, unspecified: Secondary | ICD-10-CM

## 2014-12-10 DIAGNOSIS — E782 Mixed hyperlipidemia: Secondary | ICD-10-CM

## 2014-12-10 DIAGNOSIS — E039 Hypothyroidism, unspecified: Secondary | ICD-10-CM

## 2014-12-10 DIAGNOSIS — E785 Hyperlipidemia, unspecified: Secondary | ICD-10-CM

## 2014-12-10 LAB — CBC
HCT: 45.4 % (ref 36.0–46.0)
Hemoglobin: 15.3 g/dL — ABNORMAL HIGH (ref 12.0–15.0)
MCHC: 33.6 g/dL (ref 30.0–36.0)
MCV: 97.3 fl (ref 78.0–100.0)
Platelets: 335 10*3/uL (ref 150.0–400.0)
RBC: 4.67 Mil/uL (ref 3.87–5.11)
RDW: 12.3 % (ref 11.5–15.5)
WBC: 8.3 10*3/uL (ref 4.0–10.5)

## 2014-12-10 LAB — RENAL FUNCTION PANEL
Albumin: 4 g/dL (ref 3.5–5.2)
BUN: 9 mg/dL (ref 6–23)
CHLORIDE: 105 meq/L (ref 96–112)
CO2: 25 meq/L (ref 19–32)
Calcium: 9.1 mg/dL (ref 8.4–10.5)
Creatinine, Ser: 0.7 mg/dL (ref 0.4–1.2)
GFR: 100.48 mL/min (ref >60.00)
Glucose, Bld: 113 mg/dL — ABNORMAL HIGH (ref 70–99)
Phosphorus: 2.5 mg/dL (ref 2.3–4.6)
Potassium: 4.2 mEq/L (ref 3.5–5.1)
SODIUM: 137 meq/L (ref 135–145)

## 2014-12-10 LAB — HEMOGLOBIN A1C: Hgb A1c MFr Bld: 6 % (ref 4.6–6.5)

## 2014-12-10 LAB — LIPID PANEL
Cholesterol: 180 mg/dL (ref 0–200)
HDL: 37.2 mg/dL — AB (ref >39.00)
LDL Cholesterol: 112 mg/dL — ABNORMAL HIGH (ref 0–99)
NonHDL: 142.8
TRIGLYCERIDES: 155 mg/dL — AB (ref 0.0–149.0)
Total CHOL/HDL Ratio: 5
VLDL: 31 mg/dL (ref 0.0–40.0)

## 2014-12-10 LAB — H. PYLORI ANTIBODY, IGG: H Pylori IgG: NEGATIVE

## 2014-12-10 LAB — TSH: TSH: 1.61 u[IU]/mL (ref 0.35–4.50)

## 2014-12-10 MED ORDER — RANITIDINE HCL 300 MG PO TABS: 300.0000 mg | ORAL_TABLET | Freq: Every day | ORAL | Status: DC

## 2014-12-10 MED ORDER — LEVOTHYROXINE SODIUM 100 MCG PO TABS: ORAL_TABLET | ORAL | Status: DC

## 2014-12-10 MED ORDER — BUTALBITAL-ACETAMINOPHEN 50-300 MG PO TABS: 1.0000 | ORAL_TABLET | Freq: Three times a day (TID) | ORAL | Status: DC | PRN

## 2014-12-10 NOTE — Progress Notes (Signed)
Pre visit review using our clinic review tool, if applicable. No additional management support is needed unless otherwise documented below in the visit note. 

## 2014-12-10 NOTE — Patient Instructions (Signed)
Probiotics daily such as Digestive Advantage or [employer] colon health   Migraine Headache A migraine headache is an intense, throbbing pain on one or both sides of your head. A migraine can last for 30 minutes to several hours. CAUSES  The exact cause of a migraine headache is not always known. However, a migraine may be caused when nerves in the brain become irritated and release chemicals that cause inflammation. This causes pain. Certain things may also trigger migraines, such as:  Alcohol.  Smoking.  Stress.  Menstruation.  Aged cheeses.  Foods or drinks that contain nitrates, glutamate, aspartame, or tyramine.  Lack of sleep.  Chocolate.  Caffeine.  Hunger.  Physical exertion.  Fatigue.  Medicines used to treat chest pain (nitroglycerine), birth control pills, estrogen, and some blood pressure medicines. SIGNS AND SYMPTOMS  Pain on one or both sides of your head.  Pulsating or throbbing pain.  Severe pain that prevents daily activities.  Pain that is aggravated by any physical activity.  Nausea, vomiting, or both.  Dizziness.  Pain with exposure to bright lights, loud noises, or activity.  General sensitivity to bright lights, loud noises, or smells. Before you get a migraine, you may get warning signs that a migraine is coming (aura). An aura may include:  Seeing flashing lights.  Seeing bright spots, halos, or zigzag lines.  Having tunnel vision or blurred vision.  Having feelings of numbness or tingling.  Having trouble talking.  Having muscle weakness. DIAGNOSIS  A migraine headache is often diagnosed based on:  Symptoms.  Physical exam.  A CT scan or MRI of your head. These imaging tests cannot diagnose migraines, but they can help rule out other causes of headaches. TREATMENT Medicines may be given for pain and nausea. Medicines can also be given to help prevent recurrent migraines.  HOME CARE INSTRUCTIONS  Only take  over-the-counter or prescription medicines for pain or discomfort as directed by your health care provider. The use of long-term narcotics is not recommended.  Lie down in a dark, quiet room when you have a migraine.  Keep a journal to find out what may trigger your migraine headaches. For example, write down:  What you eat and drink.  How much sleep you get.  Any change to your diet or medicines.  Limit alcohol consumption.  Quit smoking if you smoke.  Get 7-9 hours of sleep, or as recommended by your health care provider.  Limit stress.  Keep lights dim if bright lights bother you and make your migraines worse. SEEK IMMEDIATE MEDICAL CARE IF:   Your migraine becomes severe.  You have a fever.  You have a stiff neck.  You have vision loss.  You have muscular weakness or loss of muscle control.  You start losing your balance or have trouble walking.  You feel faint or pass out.  You have severe symptoms that are different from your first symptoms. MAKE SURE YOU:   Understand these instructions.  Will watch your condition.  Will get help right away if you are not doing well or get worse. Document Released: 12/04/2005 Document Revised: 04/20/2014 Document Reviewed: 08/11/2013 [facility] Patient Information 2015 [place]. This information is not intended to replace advice given to you by your health care provider. Make sure you discuss any questions you have with your health care provider.

## 2014-12-13 NOTE — Assessment & Plan Note (Signed)
Avoid offending foods, start probiotics. Do not eat large meals in late evening and consider raising head of bed.  

## 2014-12-13 NOTE — Progress Notes (Signed)
Alicia Cox  [id number] [birth year] 12/13/2014      Progress Note-Follow Up  Subjective  Chief Complaint  Chief Complaint  Patient presents with  . Follow-up    4 mos    HPI  Patient is a 43 y.o. female in today for routine medical care. Patient is in today for follow-up. Reports her DVT is improved. She has much less pain. The swelling is persistent but improving. No other recent illness. Denies CP/palp/SOB/HA/congestion/fevers/GI or GU c/o. Taking meds as prescribed  Past Medical History  Diagnosis Date  . Vitamin D deficiency   . Arthritis   . Blood in stool   . Chicken pox as a child  . Frequent headaches   . GERD (gastroesophageal reflux disease)   . Allergy     hay fever  . Heart murmur   . UTI (urinary tract infection)   . Hypothyroidism     Hashimoto's  . Migraine   . Chest pain 08/24/2013  . TOS (thoracic outlet syndrome) 08/25/2013  . Acute bronchitis 11/03/2013  . Knee pain, left 01/09/2014  . Obesity 11/15/2014    Past Surgical History  Procedure Laterality Date  . Tonsillectomy    . Cholecystectomy    . Appendectomy    . Endometrial ablation    . Tubal ligation  1999  . Cesarean section      twice    Family History  Problem Relation Age of Onset  . Hyperlipidemia Mother   . Hypertension Mother   . Diabetes Father     type 2  . Hypertension Sister   . Diabetes Sister   . Asthma Daughter   . Heart disease Maternal Grandmother   . Heart attack Maternal Grandfather   . Diabetes Paternal Grandmother   . Diabetes Paternal Grandfather     History   Social History  . Marital Status: Married    Spouse Name: N/A    Number of Children: N/A  . Years of Education: N/A   Occupational History  . Not on file.   Social History Main Topics  . Smoking status: Never Smoker   . Smokeless tobacco: Never Used  . Alcohol Use: No  . Drug Use: No  . Sexual Activity:    Partners: Male   Other Topics Concern  . Not on file   Social History  Narrative    Current Outpatient Prescriptions on File Prior to Visit  Medication Sig Dispense Refill  . ALPRAZolam (XANAX) 0.25 MG tablet Take 1 tablet (0.25 mg total) by mouth 2 (two) times daily as needed for anxiety. 20 tablet 0  . cholecalciferol (VITAMIN D) 1000 UNITS tablet Take 10,000 Units by mouth once a week.    . cyclobenzaprine (FLEXERIL) 10 MG tablet Take 1 tablet (10 mg total) by mouth at bedtime as needed for muscle spasms. 30 tablet 2  . escitalopram (LEXAPRO) 10 MG tablet Take 1 tablet (10 mg total) by mouth daily. 90 tablet 2  . HYDROcodone-acetaminophen (NORCO) 5-325 MG per tablet Take 1 tablet by mouth 3 (three) times daily as needed for moderate pain. 60 tablet 0  . rivaroxaban (XARELTO) 20 MG TABS tablet Take 1 tablet (20 mg total) by mouth daily with supper. 30 tablet 1   No current facility-administered medications on file prior to visit.    No Known Allergies  Review of Systems  Review of Systems  Constitutional: Negative for fever and malaise/fatigue.  HENT: Negative for congestion.   Eyes: Negative for discharge.  Respiratory:  Negative for shortness of breath.   Cardiovascular: Negative for chest pain, palpitations and leg swelling.  Gastrointestinal: Negative for nausea, abdominal pain and diarrhea.  Genitourinary: Negative for dysuria.  Musculoskeletal: Negative for falls.  Skin: Negative for rash.  Neurological: Negative for loss of consciousness and headaches.  Endo/Heme/Allergies: Negative for polydipsia.  Psychiatric/Behavioral: Negative for depression and suicidal ideas. The patient is not nervous/anxious and does not have insomnia.     Objective  BP 126/76 mmHg  Pulse 76  Temp(Src) 97.9 F (36.6 C) (Oral)  Ht 5\' 4"  (1.626 m)  Wt 252 lb 12.8 oz (114.669 kg)  BMI 43.37 kg/m2  SpO2 97%  LMP 12/04/2014  Physical Exam  Physical Exam  Constitutional: She is oriented to person, place, and time and well-developed, well-nourished, and in no  distress. No distress.  HENT:  Head: Normocephalic and atraumatic.  Eyes: Conjunctivae are normal.  Neck: Neck supple. No thyromegaly present.  Cardiovascular: Normal rate, regular rhythm and normal heart sounds.   No murmur heard. Pulmonary/Chest: Effort normal and breath sounds normal. She has no wheezes.  Abdominal: She exhibits no distension and no mass.  Musculoskeletal: She exhibits no edema.  Lymphadenopathy:    She has no cervical adenopathy.  Neurological: She is alert and oriented to person, place, and time.  Skin: Skin is warm and dry. No rash noted. She is not diaphoretic.  Psychiatric: Memory, affect and judgment normal.    Lab Results  Component Value Date   TSH 1.61 12/10/2014   Lab Results  Component Value Date   WBC 8.3 12/10/2014   HGB 15.3* 12/10/2014   HCT 45.4 12/10/2014   MCV 97.3 12/10/2014   PLT 335.0 12/10/2014   Lab Results  Component Value Date   CREATININE 0.7 12/10/2014   BUN 9 12/10/2014   NA 137 12/10/2014   K 4.2 12/10/2014   CL 105 12/10/2014   CO2 25 12/10/2014   Lab Results  Component Value Date   ALT 30 08/14/2014   AST 18 08/14/2014   ALKPHOS 60 08/14/2014   BILITOT 0.4 08/14/2014   Lab Results  Component Value Date   CHOL 180 12/10/2014   Lab Results  Component Value Date   HDL 37.20* 12/10/2014   Lab Results  Component Value Date   LDLCALC 112* 12/10/2014   Lab Results  Component Value Date   TRIG 155.0* 12/10/2014   Lab Results  Component Value Date   CHOLHDL 5 12/10/2014     Assessment & Plan  Hypothyroidism On Levothyroxine, continue to monitor  GERD (gastroesophageal reflux disease) Avoid offending foods, start probiotics. Do not eat large meals in late evening and consider raising head of bed.   Hyperlipidemia, mixed Encouraged heart healthy diet, increase exercise, avoid trans fats, consider a krill oil cap daily  Obesity Encouraged DASH diet, decrease po intake and increase exercise as  tolerated. Needs 7-8 hours of sleep nightly. Avoid trans fats, eat small, frequent meals every 4-5 hours with lean proteins, complex carbs and healthy fats. Minimize simple carbs.  DVT of lower limb, acute Has a sister with h/o DVT and she is very anxious about her condition, continue Xarelto and referred to hematology for further consideration

## 2014-12-13 NOTE — Assessment & Plan Note (Signed)
On Levothyroxine, continue to monitor 

## 2014-12-13 NOTE — Assessment & Plan Note (Signed)
Encouraged DASH diet, decrease po intake and increase exercise as tolerated. Needs 7-8 hours of sleep nightly. Avoid trans fats, eat small, frequent meals every 4-5 hours with lean proteins, complex carbs and healthy fats. Minimize simple carbs 

## 2014-12-13 NOTE — Assessment & Plan Note (Signed)
Encouraged heart healthy diet, increase exercise, avoid trans fats, consider a krill oil cap daily 

## 2014-12-13 NOTE — Assessment & Plan Note (Signed)
Has a sister with h/o DVT and she is very anxious about her condition, continue Xarelto and referred to hematology for further consideration

## 2014-12-29 ENCOUNTER — Telehealth: Payer: Self-pay | Admitting: Hematology & Oncology

## 2014-12-29 NOTE — Telephone Encounter (Signed)
I spoke w NEW PATIENT today to remind them of their appointment with Dr. Ennever. Also, advised them to bring all medication bottles and insurance card information. ° °

## 2014-12-30 ENCOUNTER — Ambulatory Visit (HOSPITAL_BASED_OUTPATIENT_CLINIC_OR_DEPARTMENT_OTHER): Payer: 59 | Admitting: Family

## 2014-12-30 ENCOUNTER — Other Ambulatory Visit (HOSPITAL_BASED_OUTPATIENT_CLINIC_OR_DEPARTMENT_OTHER): Payer: 59 | Admitting: Lab

## 2014-12-30 ENCOUNTER — Encounter: Payer: Self-pay | Admitting: Family

## 2014-12-30 ENCOUNTER — Ambulatory Visit: Payer: Self-pay

## 2014-12-30 DIAGNOSIS — I82432 Acute embolism and thrombosis of left popliteal vein: Secondary | ICD-10-CM

## 2014-12-30 DIAGNOSIS — I82402 Acute embolism and thrombosis of unspecified deep veins of left lower extremity: Secondary | ICD-10-CM

## 2014-12-30 NOTE — Progress Notes (Signed)
Hematology/Oncology Consultation   Name: DELANEE XIN      MRN: [id number]    Location: Room/bed info not found  Date: 12/30/2014 Time:2:58 PM   REFERRING PHYSICIAN: Stacy A. Blythe   REASON FOR CONSULT:  Unprovoked DVT of left lower extremity and sister with DVT/PE on OCP   DIAGNOSIS:  Left lower extremity DVT  HISTORY OF PRESENT ILLNESS:  Ms. Witte is a very pleasant 44 yo female with a DVT of the left lower extremity. This is her first blood clot. She noticed pain and swelling in the back of her left knee in November and doppler study confirmed DVT in the left popliteal vein and peripheral aspect of the superficial femoral vein. She has been on Xarelto since November.  Her sister has had both a DVT and PE while on birth control. The patient is not on oral contraceptives. Her aunt also had a blood clot but she cannot remember where. There is no cancer history in the family.  She has 2 health children and no miscarriages. She had 2 C-sections. She has hypothyroidism and is on synthroid. She denies fever, chills, n/v, cough, rash, headache, dizziness, SOB, chest pain, palpitations, abdominal pain, constipation, diarrhea, blood in urine or stool. No bleeding.  She has problems with her left knee and has had cortisone injection in the past. She has no swelling, tenderness, numbness or tingling in her extremities.  Her appetite is good and she is staying hydrated.  She has migraines every once in a while which she takes tylenol for. These were worse when she had cycles. She had very heavy cycles with lots of clots. She had an ablation and now only spots rarely.  She does not smoke or drink.  She states that she is up to date on her mammogram and pap smear and bother were negative. 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   ROS: All other 10 point review of systems is negative.   PAST MEDICAL HISTORY:   Past Medical History  Diagnosis Date  . Vitamin D deficiency   . Arthritis   .  Blood in stool   . Chicken pox as a child  . Frequent headaches   . GERD (gastroesophageal reflux disease)   . Allergy     hay fever  . Heart murmur   . UTI (urinary tract infection)   . Hypothyroidism     Hashimoto's  . Migraine   . Chest pain 08/24/2013  . TOS (thoracic outlet syndrome) 08/25/2013  . Acute bronchitis 11/03/2013  . Knee pain, left 01/09/2014  . Obesity 11/15/2014    ALLERGIES: No Known Allergies    MEDICATIONS:  Current Outpatient Prescriptions on File Prior to Visit  Medication Sig Dispense Refill  . ALPRAZolam (XANAX) 0.25 MG tablet Take 1 tablet (0.25 mg total) by mouth 2 (two) times daily as needed for anxiety. 20 tablet 0  . Butalbital-Acetaminophen 50-300 MG TABS Take 1 tablet by mouth 3 (three) times daily as needed. 60 tablet 0  . cholecalciferol (VITAMIN D) 1000 UNITS tablet Take 10,000 Units by mouth once a week.    . cyclobenzaprine (FLEXERIL) 10 MG tablet Take 1 tablet (10 mg total) by mouth at bedtime as needed for muscle spasms. 30 tablet 2  . escitalopram (LEXAPRO) 10 MG tablet Take 1 tablet (10 mg total) by mouth daily. 90 tablet 2  . HYDROcodone-acetaminophen (NORCO) 5-325 MG per tablet Take 1 tablet by mouth 3 (three) times daily as needed for moderate pain.  60 tablet 0  . levothyroxine (SYNTHROID, LEVOTHROID) 100 MCG tablet 1 tab po daily except 1 1/2 tabs po on Thursdays and Sundays 105 tablet 3  . ranitidine (ZANTAC) 300 MG tablet Take 1 tablet (300 mg total) by mouth at bedtime. 30 tablet 5  . rivaroxaban (XARELTO) 20 MG TABS tablet Take 1 tablet (20 mg total) by mouth daily with supper. 30 tablet 1   No current facility-administered medications on file prior to visit.     PAST SURGICAL HISTORY Past Surgical History  Procedure Laterality Date  . Tonsillectomy    . Cholecystectomy    . Appendectomy    . Endometrial ablation    . Tubal ligation  1999  . Cesarean section      twice    FAMILY HISTORY: Family History  Problem Relation  Age of Onset  . Hyperlipidemia Mother   . Hypertension Mother   . Diabetes Father     type 2  . Hypertension Sister   . Diabetes Sister   . Asthma Daughter   . Heart disease Maternal Grandmother   . Heart attack Maternal Grandfather   . Diabetes Paternal Grandmother   . Diabetes Paternal Grandfather     SOCIAL HISTORY:  reports that she has never smoked. She has never used smokeless tobacco. She reports that she does not drink alcohol or use illicit drugs.  PERFORMANCE STATUS: The patient's performance status is 0 - Asymptomatic  PHYSICAL EXAM: Most Recent Vital Signs: Last menstrual period 12/04/2014. BP 127/79 mmHg  Pulse 82  Temp(Src) 98.4 F (36.9 C) (Oral)  Resp 14  Ht  (1.626 m)  Wt 255 lb (115.667 kg)  BMI 43.75 kg/m2  LMP 12/04/2014  General Appearance:    Alert, cooperative, no distress, appears stated age  Head:    Normocephalic, without obvious abnormality, atraumatic  Eyes:    PERRL, conjunctiva/corneas clear, EOM's intact, fundi    benign, both eyes        Throat:   Lips, mucosa, and tongue normal; teeth and gums normal  Neck:   Supple, symmetrical, trachea midline, no adenopathy;    thyroid:  no enlargement/tenderness/nodules; no carotid   bruit or JVD  Back:     Symmetric, no curvature, ROM normal, no CVA tenderness  Lungs:     Clear to auscultation bilaterally, respirations unlabored  Chest Wall:    No tenderness or deformity   Heart:    Regular rate and rhythm, S1 and S2 normal, no murmur, rub   or gallop     Abdomen:     Soft, non-tender, bowel sounds active all four quadrants,    no masses, no organomegaly        Extremities:   Extremities normal, atraumatic, no cyanosis or edema  Pulses:   2+ and symmetric all extremities  Skin:   Skin color, texture, turgor normal, no rashes or lesions  Lymph nodes:   Cervical, supraclavicular, and axillary nodes normal  Neurologic:   CNII-XII intact, normal strength, sensation and reflexes     throughout   LABORATORY DATA:  No results found for this or any previous visit (from the past 48 hour(s)).    RADIOGRAPHY: No results found.     PATHOLOGY: None  ASSESSMENT/PLAN: Ms. Lottman is a very pleasant 44 yo female with a DVT of the left lower extremity. This is her first blood clot. She noticed pain and swelling in the back of her left knee in November and doppler study confirmed DVT in  the left popliteal vein and peripheral aspect of the superficial femoral vein. She has been on Xarelto since November. She is asymptomatic with her leg at this time.  She will continue on the Xarelto daily. She may only need this for a period of 6 months.  We will see what her hypercoagulable studies show.  We will see her back for a follow-up and labs in 2 months. We will also get a repeat ultrasound of her left lower extremity that same day.  All questions were answered. She knows to call the clinic with any problems, questions or concerns. We can certainly see her much sooner if necessary. The patient was discussed with Dr. Myna HidalgoEnnever and he is in agreement with the aforementioned.   [facility]SARAH M

## 2015-01-04 LAB — HYPERCOAGULABLE PANEL, COMPREHENSIVE
ANTICARDIOLIPIN IGG: 13 GPL U/mL (ref <23)
ANTITHROMB III FUNC: 89 % (ref 76–126)
Anticardiolipin IgA: 12 APL U/mL (ref <22)
Anticardiolipin IgM: 1 MPL U/mL (ref <11)
BETA 2 GLYCO I IGG: 15 G Units (ref <20)
Beta-2-Glycoprotein I IgA: 6 A Units (ref <20)
Beta-2-Glycoprotein I IgM: 8 M Units (ref <20)
DRVVT 1:1 Mix: 39.8 secs (ref <42.9)
DRVVT: 45.1 secs — ABNORMAL HIGH (ref <42.9)
Lupus Anticoagulant: NOT DETECTED
PROTEIN C ACTIVITY: 135 % — AB (ref 75–133)
PTT Lupus Anticoagulant: 32.6 secs (ref 28.0–43.0)
Protein C, Total: 88 % (ref 72–160)
Protein S Activity: 73 % (ref 69–129)
Protein S Total: 87 % (ref 60–150)

## 2015-01-12 ENCOUNTER — Telehealth: Payer: Self-pay | Admitting: *Deleted

## 2015-01-12 NOTE — Telephone Encounter (Addendum)
Patient aware.  ----- Message from Josph MachoPeter R Ennever, MD sent at 01/12/2015  7:40 AM EST ----- Call her and tell her that all of her clotting studies are normal. pete

## 2015-01-26 ENCOUNTER — Telehealth: Payer: Self-pay

## 2015-01-26 NOTE — Telephone Encounter (Deleted)
[facility] called and left a message stating a change in functions. She now needs more help standing and is fatigued after walking a few steps. Last night she woke in the middle of the night screaming about a white snake.

## 2015-01-26 NOTE — Telephone Encounter (Signed)
Marylene LandAngela, I don't think this is a patient of ours. Can you please confirm and clarify.

## 2015-01-26 NOTE — Telephone Encounter (Signed)
I opened and documented on chart by mistake. Note has been deleted and message in correct chart.

## 2015-02-17 ENCOUNTER — Other Ambulatory Visit: Payer: Self-pay | Admitting: Medical

## 2015-03-02 ENCOUNTER — Other Ambulatory Visit: Payer: Self-pay | Admitting: *Deleted

## 2015-03-02 DIAGNOSIS — M1990 Unspecified osteoarthritis, unspecified site: Secondary | ICD-10-CM

## 2015-03-02 DIAGNOSIS — I82402 Acute embolism and thrombosis of unspecified deep veins of left lower extremity: Secondary | ICD-10-CM

## 2015-03-03 ENCOUNTER — Ambulatory Visit (HOSPITAL_BASED_OUTPATIENT_CLINIC_OR_DEPARTMENT_OTHER): Payer: 59 | Admitting: Hematology & Oncology

## 2015-03-03 ENCOUNTER — Encounter: Payer: Self-pay | Admitting: Hematology & Oncology

## 2015-03-03 ENCOUNTER — Ambulatory Visit (HOSPITAL_BASED_OUTPATIENT_CLINIC_OR_DEPARTMENT_OTHER)
Admission: RE | Admit: 2015-03-03 | Discharge: 2015-03-03 | Disposition: A | Discharge status: Home / Self Care | Payer: 59 | Source: Ambulatory Visit | Attending: Family | Admitting: Family

## 2015-03-03 ENCOUNTER — Other Ambulatory Visit (HOSPITAL_BASED_OUTPATIENT_CLINIC_OR_DEPARTMENT_OTHER): Payer: 59 | Admitting: Lab

## 2015-03-03 VITALS — BP 132/64 | HR 80 | Temp 98.1°F | Resp 14 | Ht 64.0 in | Wt 258.0 lb

## 2015-03-03 DIAGNOSIS — I748 Embolism and thrombosis of other arteries: Secondary | ICD-10-CM

## 2015-03-03 DIAGNOSIS — I82532 Chronic embolism and thrombosis of left popliteal vein: Secondary | ICD-10-CM | POA: Insufficient documentation

## 2015-03-03 DIAGNOSIS — M1990 Unspecified osteoarthritis, unspecified site: Secondary | ICD-10-CM

## 2015-03-03 DIAGNOSIS — I82512 Chronic embolism and thrombosis of left femoral vein: Secondary | ICD-10-CM | POA: Insufficient documentation

## 2015-03-03 DIAGNOSIS — I82402 Acute embolism and thrombosis of unspecified deep veins of left lower extremity: Secondary | ICD-10-CM

## 2015-03-03 LAB — CBC WITH DIFFERENTIAL (CANCER CENTER ONLY)
BASO#: 0.1 10*3/uL (ref 0.0–0.2)
BASO%: 0.4 % (ref 0.0–2.0)
EOS ABS: 0.4 10*3/uL (ref 0.0–0.5)
EOS%: 2.9 % (ref 0.0–7.0)
HCT: 42.2 % (ref 34.8–46.6)
HEMOGLOBIN: 14.2 g/dL (ref 11.6–15.9)
LYMPH#: 2.6 10*3/uL (ref 0.9–3.3)
LYMPH%: 21.5 % (ref 14.0–48.0)
MCH: 32.9 pg (ref 26.0–34.0)
MCHC: 33.6 g/dL (ref 32.0–36.0)
MCV: 98 fL (ref 81–101)
MONO#: 0.8 10*3/uL (ref 0.1–0.9)
MONO%: 6.9 % (ref 0.0–13.0)
NEUT%: 68.3 % (ref 39.6–80.0)
NEUTROS ABS: 8.3 10*3/uL — AB (ref 1.5–6.5)
Platelets: 297 10*3/uL (ref 145–400)
RBC: 4.31 10*6/uL (ref 3.70–5.32)
RDW: 12 % (ref 11.1–15.7)
WBC: 12.2 10*3/uL — ABNORMAL HIGH (ref 3.9–10.0)

## 2015-03-03 NOTE — Progress Notes (Signed)
Hematology and Oncology Follow Up Visit  Alicia Cox [id number] 1971/04/17 44 y.o. 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 She comes back for her second office visit. We first saw her back in January.  We did repeat a Doppler of her leg today. The Doppler showed no acute DVT in the left leg area and there was a nonocclusive thrombus in a superficial femoral vein and within the popliteal vein.  Her leg is not swollen. There is no pain in the left leg. She's been walking without difficulties.  She's had no bleeding. She does not have any monthly cycles because of endometrial ablation about 10 years ago.  She's had no cough. She's had no chest wall pain. She's had no nausea or vomiting.  We did a hypercoagulable study on her. She has normal thrombophilic parameters.  Medications:  Current outpatient prescriptions:  .  ALPRAZolam (XANAX) 0.25 MG tablet, Take 1 tablet (0.25 mg total) by mouth 2 (two) times daily as needed for anxiety., Disp: 20 tablet, Rfl: 0 .  cholecalciferol (VITAMIN D) 1000 UNITS tablet, Take 10,000 Units by mouth once a week., Disp: , Rfl:  .  cyclobenzaprine (FLEXERIL) 10 MG tablet, Take 1 tablet (10 mg total) by mouth at bedtime as needed for muscle spasms., Disp: 30 tablet, Rfl: 2 .  escitalopram (LEXAPRO) 10 MG tablet, Take 1 tablet (10 mg total) by mouth daily., Disp: 90 tablet, Rfl: 2 .  HYDROcodone-acetaminophen (NORCO) 5-325 MG per tablet, Take 1 tablet by mouth 3 (three) times daily as needed for moderate pain., Disp: 60 tablet, Rfl: 0 .  levothyroxine (SYNTHROID, LEVOTHROID) 100 MCG tablet, 1 tab po daily except 1 1/2 tabs po on Thursdays and Sundays, Disp: 105 tablet, Rfl: 3 .  ranitidine (ZANTAC) 300 MG tablet, Take 1 tablet (300 mg total) by mouth at  bedtime., Disp: 30 tablet, Rfl: 5 .  XARELTO 20 MG TABS tablet, TAKE ONE TABLET BY MOUTH ONCE DAILY WITH  SUPPER, Disp: 30 tablet, Rfl: 0  Allergies: No Known Allergies  Past Medical History, Surgical history, Social history, and Family History were reviewed and updated.  Review of Systems: As above  Physical Exam:  height is 5\' 4"  (1.626 m) and weight is 258 lb (117.028 kg). Her oral temperature is 98.1 F (36.7 C). Her blood pressure is 132/64 and her pulse is 80. Her respiration is 14.   Wt Readings from Last 3 Encounters:  03/03/15 258 lb (117.028 kg)  12/30/14 255 lb (115.667 kg)  12/10/14 252 lb 12.8 oz (114.669 kg)     Obese white female in no obvious distress. Head and neck exam shows no ocular or oral lesions. Shows no palpable cervical or supraclavicular lymph nodes. Lungs are clear. Cardiac exam regular rate and rhythm with no murmurs, rubs or bruits. Abdomen is soft. She has good bowel sounds. There is no fluid wave. There is no palpable liver or spleen tip. Back exam shows no tenderness over the spine, ribs or hips. Extremities shows no clubbing, cyanosis or edema. There may be some slight nonpitting edema of the left lower leg. I cannot palpate any venous cord. She has a negative Homans sign with the left leg. She has good pulses in her distal extremities. Neurological exam shows no focal  neurological deficits. Skin exam shows no rashes, ecchymoses or petechia.  Lab Results  Component Value Date   WBC 12.2* 03/03/2015   HGB 14.2 03/03/2015   HCT 42.2 03/03/2015   MCV 98 03/03/2015   PLT 297 03/03/2015     Chemistry      Component Value Date/Time   NA 137 12/10/2014 0944   K 4.2 12/10/2014 0944   CL 105 12/10/2014 0944   CO2 25 12/10/2014 0944   BUN 9 12/10/2014 0944   CREATININE 0.7 12/10/2014 0944   CREATININE 0.66 08/14/2014 1029      Component Value Date/Time   CALCIUM 9.1 12/10/2014 0944   ALKPHOS 60 08/14/2014 1029   AST 18 08/14/2014 1029   ALT 30  08/14/2014 1029   BILITOT 0.4 08/14/2014 1029         Impression and Plan: Alicia Cox is 44 year old white female with an idiopathic thromboembolic event of the left leg.  The recent Doppler today shows improvement. 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  I still would plan to keep her on anticoagulation for 6 months.  I want to do another Doppler test was see her back in May. This will help [place] decide as to what she needs to be on for "maintenance" therapy. Premise for about 25-30 minutes with her today. I went over the Doppler report.   Josph Macho, MD 3/16/20165:17 PM

## 2015-03-11 ENCOUNTER — Ambulatory Visit (INDEPENDENT_AMBULATORY_CARE_PROVIDER_SITE_OTHER): Payer: 59 | Admitting: Family Medicine

## 2015-03-11 ENCOUNTER — Encounter: Payer: Self-pay | Admitting: General Practice

## 2015-03-11 ENCOUNTER — Encounter: Payer: Self-pay | Admitting: Family Medicine

## 2015-03-11 VITALS — BP 130/70 | HR 95 | Temp 98.0°F | Resp 16 | Wt 254.5 lb

## 2015-03-11 DIAGNOSIS — I82402 Acute embolism and thrombosis of unspecified deep veins of left lower extremity: Secondary | ICD-10-CM

## 2015-03-11 DIAGNOSIS — M25562 Pain in left knee: Secondary | ICD-10-CM

## 2015-03-11 DIAGNOSIS — K219 Gastro-esophageal reflux disease without esophagitis: Secondary | ICD-10-CM | POA: Diagnosis not present

## 2015-03-11 DIAGNOSIS — R52 Pain, unspecified: Secondary | ICD-10-CM

## 2015-03-11 DIAGNOSIS — R739 Hyperglycemia, unspecified: Secondary | ICD-10-CM

## 2015-03-11 DIAGNOSIS — E039 Hypothyroidism, unspecified: Secondary | ICD-10-CM

## 2015-03-11 DIAGNOSIS — M25511 Pain in right shoulder: Secondary | ICD-10-CM

## 2015-03-11 DIAGNOSIS — E782 Mixed hyperlipidemia: Secondary | ICD-10-CM

## 2015-03-11 MED ORDER — HYDROCODONE-ACETAMINOPHEN 5-325 MG PO TABS: 1.0000 | ORAL_TABLET | Freq: Three times a day (TID) | ORAL | Status: DC | PRN

## 2015-03-11 NOTE — Patient Instructions (Signed)
Annual exam at next if due   Basic Carbohydrate Counting for Diabetes Mellitus Carbohydrate counting is a method for keeping track of the amount of carbohydrates you eat. Eating carbohydrates naturally increases the level of sugar (glucose) in your blood, so it is important for you to know the amount that is okay for you to have in every meal. Carbohydrate counting helps keep the level of glucose in your blood within normal limits. The amount of carbohydrates allowed is different for every person. A dietitian can help you calculate the amount that is right for you. Once you know the amount of carbohydrates you can have, you can count the carbohydrates in the foods you want to eat. Carbohydrates are found in the following foods:  Grains, such as breads and cereals.  Dried beans and soy products.  Starchy vegetables, such as potatoes, peas, and corn.  Fruit and fruit juices.  Milk and yogurt.  Sweets and snack foods, such as cake, cookies, candy, chips, soft drinks, and fruit drinks. CARBOHYDRATE COUNTING There are two ways to count the carbohydrates in your food. You can use either of the methods or a combination of both. Reading the "Nutrition Facts" on Packaged Food The "Nutrition Facts" is an area that is included on the labels of almost all packaged food and beverages in the [place]. It includes the serving size of that food or beverage and information about the nutrients in each serving of the food, including the grams (g) of carbohydrate per serving.  Decide the number of servings of this food or beverage that you will be able to eat or drink. Multiply that number of servings by the number of grams of carbohydrate that is listed on the label for that serving. The total will be the amount of carbohydrates you will be having when you eat or drink this food or beverage. Learning Standard Serving Sizes of Food When you eat food that is not packaged or does not include "Nutrition Facts"  on the label, you need to measure the servings in order to count the amount of carbohydrates.A serving of most carbohydrate-rich foods contains about 15 g of carbohydrates. The following list includes serving sizes of carbohydrate-rich foods that provide 15 g ofcarbohydrate per serving:   1 slice of bread (1 oz) or 1 six-inch tortilla.    of a hamburger bun or English muffin.  4-6 crackers.   cup unsweetened dry cereal.    cup hot cereal.   cup rice or pasta.    cup mashed potatoes or  of a large baked potato.  1 cup fresh fruit or one small piece of fruit.    cup canned or frozen fruit or fruit juice.  1 cup milk.   cup plain fat-free yogurt or yogurt sweetened with artificial sweeteners.   cup cooked dried beans or starchy vegetable, such as peas, corn, or potatoes.  Decide the number of standard-size servings that you will eat. Multiply that number of servings by 15 (the grams of carbohydrates in that serving). For example, if you eat 2 cups of strawberries, you will have eaten 2 servings and 30 g of carbohydrates (2 servings x 15 g = 30 g). For foods such as soups and casseroles, in which more than one food is mixed in, you will need to count the carbohydrates in each food that is included. EXAMPLE OF CARBOHYDRATE COUNTING Sample Dinner  3 oz chicken breast.   cup of brown rice.   cup of corn.  1  1 cup strawberries with sugar-free whipped topping.  Carbohydrate Calculation Step 1: Identify the foods that contain carbohydrates:   Rice.   Corn.   Milk.   Strawberries. Step 2:Calculate the number of servings eaten of each:   2 servings of rice.   1 serving of corn.   1 serving of milk.   1 serving of strawberries. Step 3: Multiply each of those number of servings by 15 g:   2 servings of rice x 15 g = 30 g.   1 serving of corn x 15 g = 15 g.   1 serving of milk x 15 g = 15 g.   1 serving of strawberries x 15 g = 15  g. Step 4: Add together all of the amounts to find the total grams of carbohydrates eaten: 30 g + 15 g + 15 g + 15 g = 75 g. Document Released: 12/04/2005 Document Revised: 04/20/2014 Document Reviewed: 10/31/2013 [employer] Patient Information 2015 [employer]. This information is not intended to replace advice given to you by your health care provider. Make sure you discuss any questions you have with your health care provider.  

## 2015-03-11 NOTE — Progress Notes (Signed)
Pre visit review using our clinic review tool, if applicable. No additional management support is needed unless otherwise documented below in the visit note. 

## 2015-03-21 ENCOUNTER — Encounter: Payer: Self-pay | Admitting: Family Medicine

## 2015-03-21 NOTE — Assessment & Plan Note (Signed)
Tolerating Xarelto, is following with hematology, plan to complete 6 months of treatment. Hematology plans to repeat ultrasound in May and then likely d/c Xarelto if no new concerns emerge

## 2015-03-21 NOTE — Assessment & Plan Note (Signed)
Encouraged heart healthy diet, increase exercise, avoid trans fats, consider a krill oil cap daily 

## 2015-03-21 NOTE — Assessment & Plan Note (Signed)
Avoid offending foods, start probiotics. Do not eat large meals in late evening and consider raising head of bed. May continue Ranitidine prn

## 2015-03-21 NOTE — Assessment & Plan Note (Signed)
On Levothyroxine, continue to monitor 

## 2015-03-21 NOTE — Progress Notes (Signed)
Alicia KeyRebecca D Luba  [id number] 03-Sep-1971 03/21/2015      Progress Note-Follow Up  Subjective  Chief Complaint  Chief Complaint  Patient presents with  . Follow-up    3 month    HPI  Patient is a 44 y.o. female in today for routine medical care. Patient is in today for follow-up on numerous concerns. She reports fatigue but denies polyuria or polydipsia. Has ongoing stiffness and pain in her right shoulder and left knee but these are not worsening. Is been no swelling, warmth or redness. No recent trauma or injury. No falls. Heartburn is intermittent but tolerable on most days. Denies CP/palp/SOB/HA/congestion/fevers or GU c/o. Taking meds as prescribed  Past Medical History  Diagnosis Date  . Vitamin D deficiency   . Arthritis   . Blood in stool   . Chicken pox as a child  . Frequent headaches   . GERD (gastroesophageal reflux disease)   . Allergy     hay fever  . Heart murmur   . UTI (urinary tract infection)   . Hypothyroidism     Hashimoto's  . Migraine   . Chest pain 08/24/2013  . TOS (thoracic outlet syndrome) 08/25/2013  . Acute bronchitis 11/03/2013  . Knee pain, left 01/09/2014  . Obesity 11/15/2014    Past Surgical History  Procedure Laterality Date  . Tonsillectomy    . Cholecystectomy    . Appendectomy    . Endometrial ablation    . Tubal ligation  1999  . Cesarean section      twice    Family History  Problem Relation Age of Onset  . Hyperlipidemia Mother   . Hypertension Mother   . Diabetes Father     type 2  . Hypertension Sister   . Diabetes Sister   . Asthma Daughter   . Heart disease Maternal Grandmother   . Heart attack Maternal Grandfather   . Diabetes Paternal Grandmother   . Diabetes Paternal Grandfather     History   Social History  . Marital Status: Married    Spouse Name: N/A  . Number of Children: N/A  . Years of Education: N/A   Occupational History  . Not on file.   Social History Main Topics  . Smoking status:  Never Smoker   . Smokeless tobacco: Never Used     Comment: NEVER USED TOBACCO  . Alcohol Use: No  . Drug Use: No  . Sexual Activity:    Partners: Male   Other Topics Concern  . Not on file   Social History Narrative    Current Outpatient Prescriptions on File Prior to Visit  Medication Sig Dispense Refill  . ALPRAZolam (XANAX) 0.25 MG tablet Take 1 tablet (0.25 mg total) by mouth 2 (two) times daily as needed for anxiety. 20 tablet 0  . cholecalciferol (VITAMIN D) 1000 UNITS tablet Take 10,000 Units by mouth once a week.    . cyclobenzaprine (FLEXERIL) 10 MG tablet Take 1 tablet (10 mg total) by mouth at bedtime as needed for muscle spasms. 30 tablet 2  . escitalopram (LEXAPRO) 10 MG tablet Take 1 tablet (10 mg total) by mouth daily. 90 tablet 2  . levothyroxine (SYNTHROID, LEVOTHROID) 100 MCG tablet 1 tab po daily except 1 1/2 tabs po on Thursdays and Sundays 105 tablet 3  . ranitidine (ZANTAC) 300 MG tablet Take 1 tablet (300 mg total) by mouth at bedtime. 30 tablet 5  . XARELTO 20 MG TABS tablet TAKE ONE TABLET BY  MOUTH ONCE DAILY WITH  SUPPER 30 tablet 0   No current facility-administered medications on file prior to visit.    No Known Allergies  Review of Systems  Review of Systems  Constitutional: Negative for fever and malaise/fatigue.  HENT: Negative for congestion.   Eyes: Negative for discharge.  Respiratory: Negative for shortness of breath.   Cardiovascular: Negative for chest pain, palpitations and leg swelling.  Gastrointestinal: Negative for nausea, abdominal pain and diarrhea.  Genitourinary: Negative for dysuria.  Musculoskeletal: Positive for joint pain. Negative for falls.       Right shoulder pain, left knee pain, stable, manageable  Skin: Negative for rash.  Neurological: Negative for loss of consciousness and headaches.  Endo/Heme/Allergies: Negative for polydipsia.  Psychiatric/Behavioral: Negative for depression and suicidal ideas. The patient is  not nervous/anxious and does not have insomnia.     Objective  BP 130/70 mmHg  Pulse 95  Temp(Src) 98 F (36.7 C) (Oral)  Resp 16  Wt 254 lb 8 oz (115.44 kg)  SpO2 95%  Physical Exam  Physical Exam  Constitutional: She is oriented to person, place, and time and well-developed, well-nourished, and in no distress. No distress.  HENT:  Head: Normocephalic and atraumatic.  Eyes: Conjunctivae are normal.  Neck: Neck supple. No thyromegaly present.  Cardiovascular: Normal rate and regular rhythm.   Murmur heard. Pulmonary/Chest: Effort normal and breath sounds normal. She has no wheezes.  Abdominal: She exhibits no distension and no mass.  Musculoskeletal: She exhibits no edema.  Lymphadenopathy:    She has no cervical adenopathy.  Neurological: She is alert and oriented to person, place, and time.  Skin: Skin is warm and dry. No rash noted. She is not diaphoretic.  Psychiatric: Memory, affect and judgment normal.    Lab Results  Component Value Date   TSH 1.61 12/10/2014   Lab Results  Component Value Date   WBC 12.2* 03/03/2015   HGB 14.2 03/03/2015   HCT 42.2 03/03/2015   MCV 98 03/03/2015   PLT 297 03/03/2015   Lab Results  Component Value Date   CREATININE 0.7 12/10/2014   BUN 9 12/10/2014   NA 137 12/10/2014   K 4.2 12/10/2014   CL 105 12/10/2014   CO2 25 12/10/2014   Lab Results  Component Value Date   ALT 30 08/14/2014   AST 18 08/14/2014   ALKPHOS 60 08/14/2014   BILITOT 0.4 08/14/2014   Lab Results  Component Value Date   CHOL 180 12/10/2014   Lab Results  Component Value Date   HDL 37.20* 12/10/2014   Lab Results  Component Value Date   LDLCALC 112* 12/10/2014   Lab Results  Component Value Date   TRIG 155.0* 12/10/2014   Lab Results  Component Value Date   CHOLHDL 5 12/10/2014     Assessment & Plan  GERD (gastroesophageal reflux disease) Avoid offending foods, start probiotics. Do not eat large meals in late evening and  consider raising head of bed. May continue Ranitidine prn   Hypothyroidism On Levothyroxine, continue to monitor   DVT of lower limb, acute Tolerating Xarelto, is following with hematology, plan to complete 6 months of treatment. Hematology plans to repeat ultrasound in May and then likely d/c Xarelto if no new concerns emerge    Hyperglycemia hgba1c acceptable, minimize simple carbs. Increase exercise as tolerated.    Hyperlipidemia, mixed Encouraged heart healthy diet, increase exercise, avoid trans fats, consider a krill oil cap daily

## 2015-03-21 NOTE — Assessment & Plan Note (Signed)
hgba1c acceptable, minimize simple carbs. Increase exercise as tolerated.  

## 2015-04-17 ENCOUNTER — Ambulatory Visit (INDEPENDENT_AMBULATORY_CARE_PROVIDER_SITE_OTHER): Payer: 59 | Admitting: Internal Medicine

## 2015-04-17 ENCOUNTER — Encounter: Payer: Self-pay | Admitting: Internal Medicine

## 2015-04-17 VITALS — BP 100/76 | Temp 98.2°F | Wt 257.0 lb

## 2015-04-17 DIAGNOSIS — J0110 Acute frontal sinusitis, unspecified: Secondary | ICD-10-CM | POA: Diagnosis not present

## 2015-04-17 MED ORDER — AMOXICILLIN 500 MG PO TABS: 1000.0000 mg | ORAL_TABLET | Freq: Two times a day (BID) | ORAL | Status: DC

## 2015-04-17 NOTE — Progress Notes (Signed)
Subjective:    Patient ID: Alicia Cox, female    DOB: Sep 22, 1971, 44 y.o.   MRN: [id number]  HPI Here due to respiratory symptoms for over a week Sore throat started on field trip with school 5 days ago Ongoing cough-- productive of white phlegm Still with sore throat  Some congestion and headache Mild nausea No ear pain No fever--but took ibuprofen for the throat Some chills--no sweats No SOB  Still on zyrtec Tried advil cold and sinus--no help  Current Outpatient Prescriptions on File Prior to Visit  Medication Sig Dispense Refill  . ALPRAZolam (XANAX) 0.25 MG tablet Take 1 tablet (0.25 mg total) by mouth 2 (two) times daily as needed for anxiety. 20 tablet 0  . cholecalciferol (VITAMIN D) 1000 UNITS tablet Take 10,000 Units by mouth once a week.    . cyclobenzaprine (FLEXERIL) 10 MG tablet Take 1 tablet (10 mg total) by mouth at bedtime as needed for muscle spasms. 30 tablet 2  . escitalopram (LEXAPRO) 10 MG tablet Take 1 tablet (10 mg total) by mouth daily. 90 tablet 2  . HYDROcodone-acetaminophen (NORCO) 5-325 MG per tablet Take 1 tablet by mouth 3 (three) times daily as needed for moderate pain. 60 tablet 0  . levothyroxine (SYNTHROID, LEVOTHROID) 100 MCG tablet 1 tab po daily except 1 1/2 tabs po on Thursdays and Sundays 105 tablet 3  . ranitidine (ZANTAC) 300 MG tablet Take 1 tablet (300 mg total) by mouth at bedtime. 30 tablet 5  . XARELTO 20 MG TABS tablet TAKE ONE TABLET BY MOUTH ONCE DAILY WITH  SUPPER 30 tablet 0   No current facility-administered medications on file prior to visit.    No Known Allergies  Past Medical History  Diagnosis Date  . Vitamin D deficiency   . Arthritis   . Blood in stool   . Chicken pox as a child  . Frequent headaches   . GERD (gastroesophageal reflux disease)   . Allergy     hay fever  . Heart murmur   . UTI (urinary tract infection)   . Hypothyroidism     Hashimoto's  . Migraine   . Chest pain 08/24/2013  . TOS  (thoracic outlet syndrome) 08/25/2013  . Acute bronchitis 11/03/2013  . Knee pain, left 01/09/2014  . Obesity 11/15/2014    Past Surgical History  Procedure Laterality Date  . Tonsillectomy    . Cholecystectomy    . Appendectomy    . Endometrial ablation    . Tubal ligation  1999  . Cesarean section      twice    Family History  Problem Relation Age of Onset  . Hyperlipidemia Mother   . Hypertension Mother   . Diabetes Father     type 2  . Hypertension Sister   . Diabetes Sister   . Asthma Daughter   . Heart disease Maternal Grandmother   . Heart attack Maternal Grandfather   . Diabetes Paternal Grandmother   . Diabetes Paternal Grandfather     History   Social History  . Marital Status: Married    Spouse Name: N/A  . Number of Children: N/A  . Years of Education: N/A   Occupational History  . Not on file.   Social History Main Topics  . Smoking status: Never Smoker   . Smokeless tobacco: Never Used     Comment: NEVER USED TOBACCO  . Alcohol Use: No  . Drug Use: No  . Sexual Activity:  Partners: Male   Other Topics Concern  . Not on file   Social History Narrative   Review of Systems  No rash No vomiting or diarrhea Appetite is okay      Objective:   Physical Exam  Constitutional: She appears well-developed and well-nourished. No distress.  HENT:  Mild maxillary tenderness and moderate frontal tenderness TMs normal Moderate nasal inflammation  Neck: Normal range of motion. Neck supple. No thyromegaly present.  Pulmonary/Chest: Effort normal and breath sounds normal. No respiratory distress. She has no wheezes. She has no rales.  Lymphadenopathy:    She has no cervical adenopathy.          Assessment & Plan:

## 2015-04-17 NOTE — Patient Instructions (Signed)
If you are getting worse in the next few days, go ahead and start the amoxicillin antibiotic

## 2015-04-17 NOTE — Assessment & Plan Note (Signed)
May still just be viral Recommended continuing the ibuprofen If worsens in the next few days--start the amoxicillin

## 2015-04-17 NOTE — Progress Notes (Signed)
Pre visit review using our clinic review tool, if applicable. No additional management support is needed unless otherwise documented below in the visit note. 

## 2015-05-12 ENCOUNTER — Encounter: Payer: Self-pay | Admitting: Family

## 2015-05-12 ENCOUNTER — Ambulatory Visit (HOSPITAL_BASED_OUTPATIENT_CLINIC_OR_DEPARTMENT_OTHER): Payer: 59 | Admitting: Family

## 2015-05-12 ENCOUNTER — Other Ambulatory Visit (HOSPITAL_BASED_OUTPATIENT_CLINIC_OR_DEPARTMENT_OTHER): Payer: 59

## 2015-05-12 ENCOUNTER — Ambulatory Visit (HOSPITAL_BASED_OUTPATIENT_CLINIC_OR_DEPARTMENT_OTHER)
Admission: RE | Admit: 2015-05-12 | Discharge: 2015-05-12 | Disposition: A | Discharge status: Home / Self Care | Payer: 59 | Source: Ambulatory Visit | Attending: Hematology & Oncology | Admitting: Hematology & Oncology

## 2015-05-12 VITALS — BP 109/73 | HR 70 | Temp 98.1°F | Resp 18 | Ht 64.0 in | Wt 258.0 lb

## 2015-05-12 DIAGNOSIS — I82432 Acute embolism and thrombosis of left popliteal vein: Secondary | ICD-10-CM | POA: Diagnosis not present

## 2015-05-12 DIAGNOSIS — I82412 Acute embolism and thrombosis of left femoral vein: Secondary | ICD-10-CM | POA: Insufficient documentation

## 2015-05-12 DIAGNOSIS — I82402 Acute embolism and thrombosis of unspecified deep veins of left lower extremity: Secondary | ICD-10-CM

## 2015-05-12 LAB — CBC WITH DIFFERENTIAL (CANCER CENTER ONLY)
BASO#: 0.1 10*3/uL (ref 0.0–0.2)
BASO%: 0.8 % (ref 0.0–2.0)
EOS ABS: 0.4 10*3/uL (ref 0.0–0.5)
EOS%: 3.4 % (ref 0.0–7.0)
HCT: 42.8 % (ref 34.8–46.6)
HGB: 14.7 g/dL (ref 11.6–15.9)
LYMPH#: 2.6 10*3/uL (ref 0.9–3.3)
LYMPH%: 25.1 % (ref 14.0–48.0)
MCH: 33.2 pg (ref 26.0–34.0)
MCHC: 34.3 g/dL (ref 32.0–36.0)
MCV: 97 fL (ref 81–101)
MONO#: 0.9 10*3/uL (ref 0.1–0.9)
MONO%: 8.9 % (ref 0.0–13.0)
NEUT#: 6.4 10*3/uL (ref 1.5–6.5)
NEUT%: 61.8 % (ref 39.6–80.0)
Platelets: 307 10*3/uL (ref 145–400)
RBC: 4.43 10*6/uL (ref 3.70–5.32)
RDW: 12.2 % (ref 11.1–15.7)
WBC: 10.4 10*3/uL — ABNORMAL HIGH (ref 3.9–10.0)

## 2015-05-12 NOTE — Progress Notes (Signed)
Hematology and Oncology Follow Up Visit  Alicia Cox [id number] 03/16/1971 44 y.o. 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 Her doppler study of the left leg this morning showed a persistent partial stable thrombosis of the duplicate left lower superficial femoral vein and upper popliteal vein. 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 She does not have a compression stocking.  She did stop taking her Xarelto 2 weeks ago. She denies rash, cough, SOB, chest pain, palpitations, blood in urine or stool. No changes in her bowel or bladder habits.  No swelling, tenderness, numbness or tingling in her extremities at this time.  Her appetite is good but she states that she could be drinking more water. I emphasized the importance of staying well hydrated.  She is out for the summer and is enjoying staying at home with her daughter.   Medications:    Medication List       This list is accurate as of: 05/12/15 11:01 AM.  Always use your most recent med list.               ALPRAZolam 0.25 MG tablet  Commonly known as:  XANAX  Take 1 tablet (0.25 mg total) by mouth 2 (two) times daily as needed for anxiety.     amoxicillin 500 MG tablet  Commonly known as:  AMOXIL  Take 2 tablets (1,000 mg total) by mouth 2 (two) times daily.     cholecalciferol 1000 UNITS tablet  Commonly known as:  VITAMIN D  Take 10,000 Units by mouth once a week.     cyclobenzaprine 10 MG tablet  Commonly known as:  FLEXERIL  Take 1 tablet (10 mg total) by mouth at bedtime as needed for muscle spasms.     escitalopram 10 MG tablet  Commonly known as:  LEXAPRO  Take 1 tablet (10 mg total) by mouth daily.     HYDROcodone-acetaminophen 5-325 MG per  tablet  Commonly known as:  NORCO  Take 1 tablet by mouth 3 (three) times daily as needed for moderate pain.     levothyroxine 100 MCG tablet  Commonly known as:  SYNTHROID, LEVOTHROID  1 tab po daily except 1 1/2 tabs po on Thursdays and Sundays     ranitidine 300 MG tablet  Commonly known as:  ZANTAC  Take 1 tablet (300 mg total) by mouth at bedtime.     XARELTO 20 MG Tabs tablet  Generic drug:  rivaroxaban  TAKE ONE TABLET BY MOUTH ONCE DAILY WITH  SUPPER        Allergies: No Known Allergies  Past Medical History, Surgical history, Social history, and Family History were reviewed and updated.  Review of Systems: All other 10 point review of systems is negative.   Physical Exam:  height is  (1.626 m) and weight is 258 lb (117.028 kg). Her oral temperature is 98.1 F (36.7 C). Her blood pressure is 109/73 and her pulse is 70. Her respiration is 18.   Wt Readings from Last 3 Encounters:  05/12/15 258 lb (117.028 kg)  04/17/15 257 lb (116.574 kg)  03/11/15 254 lb 8 oz (115.44 kg)    Ocular: Sclerae unicteric, pupils equal, round  and reactive to light Ear-nose-throat: Oropharynx clear, dentition fair Lymphatic: No cervical or supraclavicular adenopathy Lungs no rales or rhonchi, good excursion bilaterally Heart regular rate and rhythm, no murmur appreciated Abd soft, nontender, positive bowel sounds MSK no focal spinal tenderness, no joint edema Neuro: non-focal, well-oriented, appropriate affect Breasts: Deferred  Lab Results  Component Value Date   WBC 10.4* 05/12/2015   HGB 14.7 05/12/2015   HCT 42.8 05/12/2015   MCV 97 05/12/2015   PLT 307 05/12/2015   No results found for: FERRITIN, IRON, TIBC, UIBC, IRONPCTSAT Lab Results  Component Value Date   RBC 4.43 05/12/2015   No results found for: KPAFRELGTCHN, LAMBDASER, KAPLAMBRATIO No results found for: IGGSERUM, IGA, IGMSERUM No results found for: Marda StalkerOTALPROTELP, ALBUMINELP, A1GS, A2GS, BETS, BETA2SER,  GAMS, MSPIKE, SPEI   Chemistry      Component Value Date/Time   NA 137 12/10/2014 0944   K 4.2 12/10/2014 0944   CL 105 12/10/2014 0944   CO2 25 12/10/2014 0944   BUN 9 12/10/2014 0944   CREATININE 0.7 12/10/2014 0944   CREATININE 0.66 08/14/2014 1029      Component Value Date/Time   CALCIUM 9.1 12/10/2014 0944   ALKPHOS 60 08/14/2014 1029   AST 18 08/14/2014 1029   ALT 30 08/14/2014 1029   BILITOT 0.4 08/14/2014 1029     Impression and Plan: Alicia Cox is 44 year old white female with an idiopathicDVT of the left leg. Her doppler study today showed she still has a persistens partial clot in the left superficial femoral and upped popliteal veins.  She has stopped the Xarelto. We will have her start taking 2 baby aspirin daily.  She will also start wearing a compression stocking her left leg.  We will plan to see her back in 3 months for labs and follow-up.  She knows to call here with any questions or concerns. We can certainly see her sooner if need be.   Verdie Mos[place],Manali Mcelmurry M, NP 5/25/201611:01 AM

## 2015-05-25 ENCOUNTER — Other Ambulatory Visit: Payer: Self-pay | Admitting: Family Medicine

## 2015-08-12 ENCOUNTER — Other Ambulatory Visit: Payer: 59

## 2015-08-12 ENCOUNTER — Ambulatory Visit: Payer: 59 | Admitting: Hematology & Oncology

## 2015-09-01 ENCOUNTER — Telehealth: Payer: Self-pay | Admitting: *Deleted

## 2015-09-01 NOTE — Telephone Encounter (Signed)
Unable to reach patient at time of Pre-Visit Call.  Left message for patient to return call when available.    

## 2015-09-01 NOTE — Telephone Encounter (Signed)
Pt is returning your call.   Please call back at: [phone]

## 2015-09-02 ENCOUNTER — Encounter: Payer: Self-pay | Admitting: Family Medicine

## 2015-09-02 ENCOUNTER — Ambulatory Visit (INDEPENDENT_AMBULATORY_CARE_PROVIDER_SITE_OTHER): Payer: 59 | Admitting: Family Medicine

## 2015-09-02 VITALS — BP 120/70 | HR 87 | Temp 98.0°F | Ht 64.0 in | Wt 262.8 lb

## 2015-09-02 DIAGNOSIS — Z1239 Encounter for other screening for malignant neoplasm of breast: Secondary | ICD-10-CM | POA: Diagnosis not present

## 2015-09-02 DIAGNOSIS — Z23 Encounter for immunization: Secondary | ICD-10-CM | POA: Diagnosis not present

## 2015-09-02 DIAGNOSIS — E782 Mixed hyperlipidemia: Secondary | ICD-10-CM | POA: Diagnosis not present

## 2015-09-02 DIAGNOSIS — M25562 Pain in left knee: Secondary | ICD-10-CM | POA: Diagnosis not present

## 2015-09-02 DIAGNOSIS — R739 Hyperglycemia, unspecified: Secondary | ICD-10-CM

## 2015-09-02 DIAGNOSIS — F411 Generalized anxiety disorder: Secondary | ICD-10-CM

## 2015-09-02 DIAGNOSIS — E039 Hypothyroidism, unspecified: Secondary | ICD-10-CM

## 2015-09-02 DIAGNOSIS — E669 Obesity, unspecified: Secondary | ICD-10-CM

## 2015-09-02 DIAGNOSIS — Z0000 Encounter for general adult medical examination without abnormal findings: Secondary | ICD-10-CM | POA: Diagnosis not present

## 2015-09-02 DIAGNOSIS — K219 Gastro-esophageal reflux disease without esophagitis: Secondary | ICD-10-CM | POA: Diagnosis not present

## 2015-09-02 DIAGNOSIS — I82402 Acute embolism and thrombosis of unspecified deep veins of left lower extremity: Secondary | ICD-10-CM

## 2015-09-02 MED ORDER — INFLUENZA VAC SPLIT QUAD 0.5 ML IM SUSY
0.5000 mL | PREFILLED_SYRINGE | INTRAMUSCULAR | Status: AC
  Administered 2015-09-02: 0.5 mL via INTRAMUSCULAR

## 2015-09-02 MED ORDER — ESCITALOPRAM OXALATE 10 MG PO TABS: 10.0000 mg | ORAL_TABLET | Freq: Every day | ORAL | Status: DC

## 2015-09-02 NOTE — Patient Instructions (Signed)
[employer] at [email or web address] 10 strain probiotic daily   Small, frequent meals with lean proteins, complex carbohydrates, eat roughly every 4 to 5 hours only one carb per meal 64 oz of clear   Preventive Care for Adults A healthy lifestyle and preventive care can promote health and wellness. Preventive health guidelines for women include the following key practices.  A routine yearly physical is a good way to check with your health care provider about your health and preventive screening. It is a chance to share any concerns and updates on your health and to receive a thorough exam.  Visit your dentist for a routine exam and preventive care every 6 months. Brush your teeth twice a day and floss once a day. Good oral hygiene prevents tooth decay and gum disease.  The frequency of eye exams is based on your age, health, family medical history, use of contact lenses, and other factors. Follow your health care provider's recommendations for frequency of eye exams.  Eat a healthy diet. Foods like vegetables, fruits, whole grains, low-fat dairy products, and lean protein foods contain the nutrients you need without too many calories. Decrease your intake of foods high in solid fats, added sugars, and salt. Eat the right amount of calories for you.Get information about a proper diet from your health care provider, if necessary.  Regular physical exercise is one of the most important things you can do for your health. Most adults should get at least 150 minutes of moderate-intensity exercise (any activity that increases your heart rate and causes you to sweat) each week. In addition, most adults need muscle-strengthening exercises on 2 or more days a week.  Maintain a healthy weight. The body mass index (BMI) is a screening tool to identify possible weight problems. It provides an estimate of body fat based on height and weight. Your health care provider can find  your BMI and can help you achieve or maintain a healthy weight.For adults 20 years and older:  A BMI below 18.5 is considered underweight.  A BMI of 18.5 to 24.9 is normal.  A BMI of 25 to 29.9 is considered overweight.  A BMI of 30 and above is considered obese.  Maintain normal blood lipids and cholesterol levels by exercising and minimizing your intake of saturated fat. Eat a balanced diet with plenty of fruit and vegetables. Blood tests for lipids and cholesterol should begin at age 88 and be repeated every 5 years. If your lipid or cholesterol levels are high, you are over 50, or you are at high risk for heart disease, you may need your cholesterol levels checked more frequently.Ongoing high lipid and cholesterol levels should be treated with medicines if diet and exercise are not working.  If you smoke, find out from your health care provider how to quit. If you do not use tobacco, do not start.  Lung cancer screening is recommended for adults aged 31-80 years who are at high risk for developing lung cancer because of a history of smoking. A yearly low-dose CT scan of the lungs is recommended for people who have at least a 30-pack-year history of smoking and are a current smoker or have quit within the past 15 years. A pack year of smoking is smoking an average of 1 pack of cigarettes a day for 1 year (for example: 1 pack a day for 30 years or 2 packs a day for 15 years). Yearly screening should continue until the smoker has stopped  smoking for at least 15 years. Yearly screening should be stopped for people who develop a health problem that would prevent them from having lung cancer treatment.  If you are pregnant, do not drink alcohol. If you are breastfeeding, be very cautious about drinking alcohol. If you are not pregnant and choose to drink alcohol, do not have more than 1 drink per day. One drink is considered to be 12 ounces (355 mL) of beer, 5 ounces (148 mL) of wine, or 1.5 ounces  (44 mL) of liquor.  Avoid use of street drugs. Do not share needles with anyone. Ask for help if you need support or instructions about stopping the use of drugs.  High blood pressure causes heart disease and increases the risk of stroke. Your blood pressure should be checked at least every 1 to 2 years. Ongoing high blood pressure should be treated with medicines if weight loss and exercise do not work.  If you are 25-10 years old, ask your health care provider if you should take aspirin to prevent strokes.  Diabetes screening involves taking a blood sample to check your fasting blood sugar level. This should be done once every 3 years, after age 23, if you are within normal weight and without risk factors for diabetes. Testing should be considered at a younger age or be carried out more frequently if you are overweight and have at least 1 risk factor for diabetes.  Breast cancer screening is essential preventive care for women. You should practice "breast self-awareness." This means understanding the normal appearance and feel of your breasts and may include breast self-examination. Any changes detected, no matter how small, should be reported to a health care provider. Women in their 18s and 30s should have a clinical breast exam (CBE) by a health care provider as part of a regular health exam every 1 to 3 years. After age 41, women should have a CBE every year. Starting at age 22, women should consider having a mammogram (breast X-ray test) every year. Women who have a family history of breast cancer should talk to their health care provider about genetic screening. Women at a high risk of breast cancer should talk to their health care providers about having an MRI and a mammogram every year.  Breast cancer gene (BRCA)-related cancer risk assessment is recommended for women who have family members with BRCA-related cancers. BRCA-related cancers include breast, ovarian, tubal, and peritoneal cancers.  Having family members with these cancers may be associated with an increased risk for harmful changes (mutations) in the breast cancer genes BRCA1 and BRCA2. Results of the assessment will determine the need for genetic counseling and BRCA1 and BRCA2 testing.  Routine pelvic exams to screen for cancer are no longer recommended for nonpregnant women who are considered low risk for cancer of the pelvic organs (ovaries, uterus, and vagina) and who do not have symptoms. Ask your health care provider if a screening pelvic exam is right for you.  If you have had past treatment for cervical cancer or a condition that could lead to cancer, you need Pap tests and screening for cancer for at least 20 years after your treatment. If Pap tests have been discontinued, your risk factors (such as having a new sexual partner) need to be reassessed to determine if screening should be resumed. Some women have medical problems that increase the chance of getting cervical cancer. In these cases, your health care provider may recommend more frequent screening and Pap tests.  The HPV test is an additional test that may be used for cervical cancer screening. The HPV test looks for the virus that can cause the cell changes on the cervix. The cells collected during the Pap test can be tested for HPV. The HPV test could be used to screen women aged 38 years and older, and should be used in women of any age who have unclear Pap test results. After the age of 16, women should have HPV testing at the same frequency as a Pap test.  Colorectal cancer can be detected and often prevented. Most routine colorectal cancer screening begins at the age of 83 years and continues through age 18 years. However, your health care provider may recommend screening at an earlier age if you have risk factors for colon cancer. On a yearly basis, your health care provider may provide home test kits to check for hidden blood in the stool. Use of a small  camera at the end of a tube, to directly examine the colon (sigmoidoscopy or colonoscopy), can detect the earliest forms of colorectal cancer. Talk to your health care provider about this at age 80, when routine screening begins. Direct exam of the colon should be repeated every 5-10 years through age 51 years, unless early forms of pre-cancerous polyps or small growths are found.  People who are at an increased risk for hepatitis B should be screened for this virus. You are considered at high risk for hepatitis B if:  You were born in a country where hepatitis B occurs often. Talk with your health care provider about which countries are considered high risk.  Your parents were born in a high-risk country and you have not received a shot to protect against hepatitis B (hepatitis B vaccine).  You have HIV or AIDS.  You use needles to inject street drugs.  You live with, or have sex with, someone who has hepatitis B.  You get hemodialysis treatment.  You take certain medicines for conditions like cancer, organ transplantation, and autoimmune conditions.  Hepatitis C blood testing is recommended for all people born from [birth year] through 1965 and any individual with known risks for hepatitis C.  Practice safe sex. Use condoms and avoid high-risk sexual practices to reduce the spread of sexually transmitted infections (STIs). STIs include gonorrhea, chlamydia, syphilis, trichomonas, herpes, HPV, and human immunodeficiency virus (HIV). Herpes, HIV, and HPV are viral illnesses that have no cure. They can result in disability, cancer, and death.  You should be screened for sexually transmitted illnesses (STIs) including gonorrhea and chlamydia if:  You are sexually active and are younger than 24 years.  You are older than 24 years and your health care provider tells you that you are at risk for this type of infection.  Your sexual activity has changed since you were last screened and you are at an  increased risk for chlamydia or gonorrhea. Ask your health care provider if you are at risk.  If you are at risk of being infected with HIV, it is recommended that you take a prescription medicine daily to prevent HIV infection. This is called preexposure prophylaxis (PrEP). You are considered at risk if:  You are a heterosexual woman, are sexually active, and are at increased risk for HIV infection.  You take drugs by injection.  You are sexually active with a partner who has HIV.  Talk with your health care provider about whether you are at high risk of being infected with HIV. If you  choose to begin PrEP, you should first be tested for HIV. You should then be tested every 3 months for as long as you are taking PrEP.  Osteoporosis is a disease in which the bones lose minerals and strength with aging. This can result in serious bone fractures or breaks. The risk of osteoporosis can be identified using a bone density scan. Women ages 66 years and over and women at risk for fractures or osteoporosis should discuss screening with their health care providers. Ask your health care provider whether you should take a calcium supplement or vitamin D to reduce the rate of osteoporosis.  Menopause can be associated with physical symptoms and risks. Hormone replacement therapy is available to decrease symptoms and risks. You should talk to your health care provider about whether hormone replacement therapy is right for you.  Use sunscreen. Apply sunscreen liberally and repeatedly throughout the day. You should seek shade when your shadow is shorter than you. Protect yourself by wearing long sleeves, pants, a wide-brimmed hat, and sunglasses year round, whenever you are outdoors.  Once a month, do a whole body skin exam, using a mirror to look at the skin on your back. Tell your health care provider of new moles, moles that have irregular borders, moles that are larger than a pencil eraser, or moles that have  changed in shape or color.  Stay current with required vaccines (immunizations).  Influenza vaccine. All adults should be immunized every year.  Tetanus, diphtheria, and acellular pertussis (Td, Tdap) vaccine. Pregnant women should receive 1 dose of Tdap vaccine during each pregnancy. The dose should be obtained regardless of the length of time since the last dose. Immunization is preferred during the 27th-36th week of gestation. An adult who has not previously received Tdap or who does not know her vaccine status should receive 1 dose of Tdap. This initial dose should be followed by tetanus and diphtheria toxoids (Td) booster doses every 10 years. Adults with an unknown or incomplete history of completing a 3-dose immunization series with Td-containing vaccines should begin or complete a primary immunization series including a Tdap dose. Adults should receive a Td booster every 10 years.  Varicella vaccine. An adult without evidence of immunity to varicella should receive 2 doses or a second dose if she has previously received 1 dose. Pregnant females who do not have evidence of immunity should receive the first dose after pregnancy. This first dose should be obtained before leaving the health care facility. The second dose should be obtained 4-8 weeks after the first dose.  Human papillomavirus (HPV) vaccine. Females aged 13-26 years who have not received the vaccine previously should obtain the 3-dose series. The vaccine is not recommended for use in pregnant females. However, pregnancy testing is not needed before receiving a dose. If a female is found to be pregnant after receiving a dose, no treatment is needed. In that case, the remaining doses should be delayed until after the pregnancy. Immunization is recommended for any person with an immunocompromised condition through the age of 34 years if she did not get any or all doses earlier. During the 3-dose series, the second dose should be obtained  4-8 weeks after the first dose. The third dose should be obtained 24 weeks after the first dose and 16 weeks after the second dose.  Zoster vaccine. One dose is recommended for adults aged 53 years or older unless certain conditions are present.  Measles, mumps, and rubella (MMR) vaccine. Adults born before  1956 generally are considered immune to measles and mumps. Adults born in [birth year] or later should have 1 or more doses of MMR vaccine unless there is a contraindication to the vaccine or there is laboratory evidence of immunity to each of the three diseases. A routine second dose of MMR vaccine should be obtained at least 28 days after the first dose for students attending postsecondary schools, health care workers, or international travelers. People who received inactivated measles vaccine or an unknown type of measles vaccine during 1963-1967 should receive 2 doses of MMR vaccine. People who received inactivated mumps vaccine or an unknown type of mumps vaccine before 1979 and are at high risk for mumps infection should consider immunization with 2 doses of MMR vaccine. For females of childbearing age, rubella immunity should be determined. If there is no evidence of immunity, females who are not pregnant should be vaccinated. If there is no evidence of immunity, females who are pregnant should delay immunization until after pregnancy. Unvaccinated health care workers born before [birth year] who lack laboratory evidence of measles, mumps, or rubella immunity or laboratory confirmation of disease should consider measles and mumps immunization with 2 doses of MMR vaccine or rubella immunization with 1 dose of MMR vaccine.  Pneumococcal 13-valent conjugate (PCV13) vaccine. When indicated, a person who is uncertain of her immunization history and has no record of immunization should receive the PCV13 vaccine. An adult aged 67 years or older who has certain medical conditions and has not been previously immunized should  receive 1 dose of PCV13 vaccine. This PCV13 should be followed with a dose of pneumococcal polysaccharide (PPSV23) vaccine. The PPSV23 vaccine dose should be obtained at least 8 weeks after the dose of PCV13 vaccine. An adult aged 21 years or older who has certain medical conditions and previously received 1 or more doses of PPSV23 vaccine should receive 1 dose of PCV13. The PCV13 vaccine dose should be obtained 1 or more years after the last PPSV23 vaccine dose.  Pneumococcal polysaccharide (PPSV23) vaccine. When PCV13 is also indicated, PCV13 should be obtained first. All adults aged 42 years and older should be immunized. An adult younger than age 90 years who has certain medical conditions should be immunized. Any person who resides in a nursing home or long-term care facility should be immunized. An adult smoker should be immunized. People with an immunocompromised condition and certain other conditions should receive both PCV13 and PPSV23 vaccines. People with human immunodeficiency virus (HIV) infection should be immunized as soon as possible after diagnosis. Immunization during chemotherapy or radiation therapy should be avoided. Routine use of PPSV23 vaccine is not recommended for American Indians, [place] Natives, or people younger than 65 years unless there are medical conditions that require PPSV23 vaccine. When indicated, people who have unknown immunization and have no record of immunization should receive PPSV23 vaccine. One-time revaccination 5 years after the first dose of PPSV23 is recommended for people aged 19-64 years who have chronic kidney failure, nephrotic syndrome, asplenia, or immunocompromised conditions. People who received 1-2 doses of PPSV23 before age 25 years should receive another dose of PPSV23 vaccine at age 44 years or later if at least 5 years have passed since the previous dose. Doses of PPSV23 are not needed for people immunized with PPSV23 at or after age 41  years.  Meningococcal vaccine. Adults with asplenia or persistent complement component deficiencies should receive 2 doses of quadrivalent meningococcal conjugate (MenACWY-D) vaccine. The doses should be obtained at least 2 months  apart. Microbiologists working with certain meningococcal bacteria, [place] recruits, people at risk during an outbreak, and people who travel to or live in countries with a high rate of meningitis should be immunized. A first-year college student up through age 69 years who is living in a residence hall should receive a dose if she did not receive a dose on or after her 16th birthday. Adults who have certain high-risk conditions should receive one or more doses of vaccine.  Hepatitis A vaccine. Adults who wish to be protected from this disease, have certain high-risk conditions, work with hepatitis A-infected animals, work in hepatitis A research labs, or travel to or work in countries with a high rate of hepatitis A should be immunized. Adults who were previously unvaccinated and who anticipate close contact with an international adoptee during the first 60 days after arrival in the [place] from a country with a high rate of hepatitis A should be immunized.  Hepatitis B vaccine. Adults who wish to be protected from this disease, have certain high-risk conditions, may be exposed to blood or other infectious body fluids, are household contacts or sex partners of hepatitis B positive people, are clients or workers in certain care facilities, or travel to or work in countries with a high rate of hepatitis B should be immunized.  Haemophilus influenzae type b (Hib) vaccine. A previously unvaccinated person with asplenia or sickle cell disease or having a scheduled splenectomy should receive 1 dose of Hib vaccine. Regardless of previous immunization, a recipient of a hematopoietic stem cell transplant should receive a 3-dose series 6-12 months after her successful transplant.  Hib vaccine is not recommended for adults with HIV infection. Preventive Services / Frequency Ages 67 to 31 years  Blood pressure check.** / Every 1 to 2 years.  Lipid and cholesterol check.** / Every 5 years beginning at age 75.  Clinical breast exam.** / Every 3 years for women in their 32s and 21s.  BRCA-related cancer risk assessment.** / For women who have family members with a BRCA-related cancer (breast, ovarian, tubal, or peritoneal cancers).  Pap test.** / Every 2 years from ages 31 through 67. Every 3 years starting at age 33 through age 62 or 16 with a history of 3 consecutive normal Pap tests.  HPV screening.** / Every 3 years from ages 38 through ages 45 to 33 with a history of 3 consecutive normal Pap tests.  Hepatitis C blood test.** / For any individual with known risks for hepatitis C.  Skin self-exam. / Monthly.  Influenza vaccine. / Every year.  Tetanus, diphtheria, and acellular pertussis (Tdap, Td) vaccine.** / Consult your health care provider. Pregnant women should receive 1 dose of Tdap vaccine during each pregnancy. 1 dose of Td every 10 years.  Varicella vaccine.** / Consult your health care provider. Pregnant females who do not have evidence of immunity should receive the first dose after pregnancy.  HPV vaccine. / 3 doses over 6 months, if 61 and younger. The vaccine is not recommended for use in pregnant females. However, pregnancy testing is not needed before receiving a dose.  Measles, mumps, rubella (MMR) vaccine.** / You need at least 1 dose of MMR if you were born in 1957 or later. You may also need a 2nd dose. For females of childbearing age, rubella immunity should be determined. If there is no evidence of immunity, females who are not pregnant should be vaccinated. If there is no evidence of immunity, females who are pregnant should  delay immunization until after pregnancy.  Pneumococcal 13-valent conjugate (PCV13) vaccine.** / Consult your health  care provider.  Pneumococcal polysaccharide (PPSV23) vaccine.** / 1 to 2 doses if you smoke cigarettes or if you have certain conditions.  Meningococcal vaccine.** / 1 dose if you are age 67 to 72 years and a Market researcher living in a residence hall, or have one of several medical conditions, you need to get vaccinated against meningococcal disease. You may also need additional booster doses.  Hepatitis A vaccine.** / Consult your health care provider.  Hepatitis B vaccine.** / Consult your health care provider.  Haemophilus influenzae type b (Hib) vaccine.** / Consult your health care provider. Ages 19 to 45 years  Blood pressure check.** / Every 1 to 2 years.  Lipid and cholesterol check.** / Every 5 years beginning at age 33 years.  Lung cancer screening. / Every year if you are aged 92-80 years and have a 30-pack-year history of smoking and currently smoke or have quit within the past 15 years. Yearly screening is stopped once you have quit smoking for at least 15 years or develop a health problem that would prevent you from having lung cancer treatment.  Clinical breast exam.** / Every year after age 42 years.  BRCA-related cancer risk assessment.** / For women who have family members with a BRCA-related cancer (breast, ovarian, tubal, or peritoneal cancers).  Mammogram.** / Every year beginning at age 74 years and continuing for as long as you are in good health. Consult with your health care provider.  Pap test.** / Every 3 years starting at age 27 years through age 58 or 42 years with a history of 3 consecutive normal Pap tests.  HPV screening.** / Every 3 years from ages 61 years through ages 10 to 67 years with a history of 3 consecutive normal Pap tests.  Fecal occult blood test (FOBT) of stool. / Every year beginning at age 69 years and continuing until age 13 years. You may not need to do this test if you get a colonoscopy every 10 years.  Flexible  sigmoidoscopy or colonoscopy.** / Every 5 years for a flexible sigmoidoscopy or every 10 years for a colonoscopy beginning at age 74 years and continuing until age 47 years.  Hepatitis C blood test.** / For all people born from [birth year] through 1964 and any individual with known risks for hepatitis C.  Skin self-exam. / Monthly.  Influenza vaccine. / Every year.  Tetanus, diphtheria, and acellular pertussis (Tdap/Td) vaccine.** / Consult your health care provider. Pregnant women should receive 1 dose of Tdap vaccine during each pregnancy. 1 dose of Td every 10 years.  Varicella vaccine.** / Consult your health care provider. Pregnant females who do not have evidence of immunity should receive the first dose after pregnancy.  Zoster vaccine.** / 1 dose for adults aged 25 years or older.  Measles, mumps, rubella (MMR) vaccine.** / You need at least 1 dose of MMR if you were born in 1957 or later. You may also need a 2nd dose. For females of childbearing age, rubella immunity should be determined. If there is no evidence of immunity, females who are not pregnant should be vaccinated. If there is no evidence of immunity, females who are pregnant should delay immunization until after pregnancy.  Pneumococcal 13-valent conjugate (PCV13) vaccine.** / Consult your health care provider.  Pneumococcal polysaccharide (PPSV23) vaccine.** / 1 to 2 doses if you smoke cigarettes or if you have certain conditions.  Meningococcal vaccine.** /  Consult your health care provider.  Hepatitis A vaccine.** / Consult your health care provider.  Hepatitis B vaccine.** / Consult your health care provider.  Haemophilus influenzae type b (Hib) vaccine.** / Consult your health care provider. Ages 54 years and over  Blood pressure check.** / Every 1 to 2 years.  Lipid and cholesterol check.** / Every 5 years beginning at age 2 years.  Lung cancer screening. / Every year if you are aged 79-80 years and have a  30-pack-year history of smoking and currently smoke or have quit within the past 15 years. Yearly screening is stopped once you have quit smoking for at least 15 years or develop a health problem that would prevent you from having lung cancer treatment.  Clinical breast exam.** / Every year after age 62 years.  BRCA-related cancer risk assessment.** / For women who have family members with a BRCA-related cancer (breast, ovarian, tubal, or peritoneal cancers).  Mammogram.** / Every year beginning at age 71 years and continuing for as long as you are in good health. Consult with your health care provider.  Pap test.** / Every 3 years starting at age 10 years through age 55 or 24 years with 3 consecutive normal Pap tests. Testing can be stopped between 65 and 70 years with 3 consecutive normal Pap tests and no abnormal Pap or HPV tests in the past 10 years.  HPV screening.** / Every 3 years from ages 25 years through ages 61 or 92 years with a history of 3 consecutive normal Pap tests. Testing can be stopped between 65 and 70 years with 3 consecutive normal Pap tests and no abnormal Pap or HPV tests in the past 10 years.  Fecal occult blood test (FOBT) of stool. / Every year beginning at age 67 years and continuing until age 48 years. You may not need to do this test if you get a colonoscopy every 10 years.  Flexible sigmoidoscopy or colonoscopy.** / Every 5 years for a flexible sigmoidoscopy or every 10 years for a colonoscopy beginning at age 49 years and continuing until age 15 years.  Hepatitis C blood test.** / For all people born from [birth year] through 1964 and any individual with known risks for hepatitis C.  Osteoporosis screening.** / A one-time screening for women ages 67 years and over and women at risk for fractures or osteoporosis.  Skin self-exam. / Monthly.  Influenza vaccine. / Every year.  Tetanus, diphtheria, and acellular pertussis (Tdap/Td) vaccine.** / 1 dose of Td every 10  years.  Varicella vaccine.** / Consult your health care provider.  Zoster vaccine.** / 1 dose for adults aged 31 years or older.  Pneumococcal 13-valent conjugate (PCV13) vaccine.** / Consult your health care provider.  Pneumococcal polysaccharide (PPSV23) vaccine.** / 1 dose for all adults aged 74 years and older.  Meningococcal vaccine.** / Consult your health care provider.  Hepatitis A vaccine.** / Consult your health care provider.  Hepatitis B vaccine.** / Consult your health care provider.  Haemophilus influenzae type b (Hib) vaccine.** / Consult your health care provider. ** Family history and personal history of risk and conditions may change your health care provider's recommendations. Document Released: 01/30/2002 Document Revised: 04/20/2014 Document Reviewed: 05/01/2011 [facility] Patient Information 2015 [place], [place]. This information is not intended to replace advice given to you by your health care provider. Make sure you discuss any questions you have with your health care provider.

## 2015-09-02 NOTE — Progress Notes (Signed)
Pre visit review using our clinic review tool, if applicable. No additional management support is needed unless otherwise documented below in the visit note. 

## 2015-09-03 ENCOUNTER — Encounter: Payer: Self-pay | Admitting: Family Medicine

## 2015-09-03 LAB — CBC
HCT: 42 % (ref 36.0–46.0)
Hemoglobin: 14.2 g/dL (ref 12.0–15.0)
MCHC: 33.7 g/dL (ref 30.0–36.0)
MCV: 96.6 fl (ref 78.0–100.0)
PLATELETS: 374 10*3/uL (ref 150.0–400.0)
RBC: 4.35 Mil/uL (ref 3.87–5.11)
RDW: 12.6 % (ref 11.5–15.5)
WBC: 9.8 10*3/uL (ref 4.0–10.5)

## 2015-09-03 LAB — LIPID PANEL
CHOLESTEROL: 161 mg/dL (ref 0–200)
HDL: 34.1 mg/dL — ABNORMAL LOW (ref >39.00)
LDL Cholesterol: 87 mg/dL (ref 0–99)
NonHDL: 127.01
Total CHOL/HDL Ratio: 5
Triglycerides: 198 mg/dL — ABNORMAL HIGH (ref 0.0–149.0)
VLDL: 39.6 mg/dL (ref 0.0–40.0)

## 2015-09-03 LAB — COMPREHENSIVE METABOLIC PANEL
ALBUMIN: 4 g/dL (ref 3.5–5.2)
ALT: 17 U/L (ref 0–35)
AST: 16 U/L (ref 0–37)
Alkaline Phosphatase: 58 U/L (ref 39–117)
BILIRUBIN TOTAL: 0.2 mg/dL (ref 0.2–1.2)
BUN: 10 mg/dL (ref 6–23)
CO2: 29 mEq/L (ref 19–32)
Calcium: 9 mg/dL (ref 8.4–10.5)
Chloride: 104 mEq/L (ref 96–112)
Creatinine, Ser: 0.75 mg/dL (ref 0.40–1.20)
GFR: 89.43 mL/min (ref >60.00)
Glucose, Bld: 84 mg/dL (ref 70–99)
Potassium: 4.3 mEq/L (ref 3.5–5.1)
SODIUM: 141 meq/L (ref 135–145)
TOTAL PROTEIN: 7.4 g/dL (ref 6.0–8.3)

## 2015-09-03 LAB — TSH: TSH: 2.17 u[IU]/mL (ref 0.35–4.50)

## 2015-09-03 LAB — HEMOGLOBIN A1C: HEMOGLOBIN A1C: 5.8 % (ref 4.6–6.5)

## 2015-09-12 ENCOUNTER — Encounter: Payer: Self-pay | Admitting: Family Medicine

## 2015-09-12 DIAGNOSIS — Z0000 Encounter for general adult medical examination without abnormal findings: Secondary | ICD-10-CM

## 2015-09-12 HISTORY — DX: Encounter for general adult medical examination without abnormal findings: Z00.00

## 2015-09-12 NOTE — Assessment & Plan Note (Signed)
Encouraged heart healthy diet, increase exercise, avoid trans fats, consider a krill oil cap daily 

## 2015-09-12 NOTE — Assessment & Plan Note (Signed)
Has finished course of Lesia Hausen and has had no concerning symptoms

## 2015-09-12 NOTE — Assessment & Plan Note (Signed)
Avoid offending foods, start probiotics. Do not eat large meals in late evening and consider raising head of bed.  

## 2015-09-12 NOTE — Assessment & Plan Note (Signed)
On Levothyroxine, continue to monitor 

## 2015-09-12 NOTE — Assessment & Plan Note (Signed)
Encouraged increased hydration, 64 ounces of clear fluids daily. Minimize alcohol and caffeine. Eat small frequent meals with lean proteins and complex carbs. Avoid high and low blood sugars. Get adequate sleep, 7-8 hours a night. Needs exercise daily preferably in the morning.  

## 2015-09-12 NOTE — Assessment & Plan Note (Signed)
hgba1c acceptable, minimize simple carbs. Increase exercise as tolerated.  

## 2015-09-12 NOTE — Assessment & Plan Note (Signed)
Doing well on Escitalopram continue the same

## 2015-09-12 NOTE — Assessment & Plan Note (Signed)
Patient encouraged to maintain heart healthy diet, regular exercise, adequate sleep. Consider daily probiotics. Take medications as prescribed. Follows with [facility]. Labs reviewed. Given and reviewed copy of ACP documents from [employer] and encouraged to complete and return

## 2015-09-12 NOTE — Assessment & Plan Note (Signed)
Encouraged DASH diet, decrease po intake and increase exercise as tolerated. Needs 7-8 hours of sleep nightly. Avoid trans fats, eat small, frequent meals every 4-5 hours with lean proteins, complex carbs and healthy fats. Minimize simple carbs, GMO foods. 

## 2015-09-12 NOTE — Progress Notes (Signed)
Subjective:    Patient ID: Alicia Cox, female    DOB: 1970-12-23, 44 y.o.   MRN: [id number]  Chief Complaint  Patient presents with  . Annual Exam    No PAP per patient    HPI Patient is in today for annual exam and follow-up on numerous conditions. She has recently stopped her Xarelto for treatment of her acute DVT and has had no recurrence of swelling or pain. Overall she feels well. No recent illness. She continues to follow which [facility] for her Pap smears. 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 Denies CP/palp/SOB/HA/congestion/fevers/GI or GU c/o. Taking meds as prescribed  Past Medical History  Diagnosis Date  . Vitamin D deficiency   . Arthritis   . Blood in stool   . Chicken pox as a child  . Frequent headaches   . GERD (gastroesophageal reflux disease)   . Allergy     hay fever  . Heart murmur   . UTI (urinary tract infection)   . Hypothyroidism     Hashimoto's  . Migraine   . Chest pain 08/24/2013  . TOS (thoracic outlet syndrome) 08/25/2013  . Acute bronchitis 11/03/2013  . Knee pain, left 01/09/2014  . Obesity 11/15/2014  . Preventative health care 09/12/2015    Past Surgical History  Procedure Laterality Date  . Tonsillectomy    . Cholecystectomy    . Appendectomy    . Endometrial ablation    . Tubal ligation  1999  . Cesarean section      twice    Family History  Problem Relation Age of Onset  . Hyperlipidemia Mother   . Hypertension Mother   . Diabetes Father     type 2  . Hypertension Sister   . Diabetes Sister   . Asthma Daughter   . Heart disease Maternal Grandmother   . Heart attack Maternal Grandfather   . Diabetes Paternal Grandmother   . Diabetes Paternal Grandfather     Social History   Social History  . Marital Status: Married    Spouse Name: N/A  . Number of Children: N/A  . Years of Education: N/A   Occupational History  . Not on file.   Social  History Main Topics  . Smoking status: Never Smoker   . Smokeless tobacco: Never Used     Comment: NEVER USED TOBACCO  . Alcohol Use: No  . Drug Use: No  . 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 Not on file   Social History Narrative    Outpatient Prescriptions Prior to Visit  Medication Sig Dispense Refill  . ALPRAZolam (XANAX) 0.25 MG tablet Take 1 tablet (0.25 mg total) by mouth 2 (two) times daily as needed for anxiety. 20 tablet 0  . cholecalciferol (VITAMIN D) 1000 UNITS tablet Take 10,000 Units by mouth once a week.    . cyclobenzaprine (FLEXERIL) 10 MG tablet Take 1 tablet (10 mg total) by mouth at bedtime as needed for muscle spasms. 30 tablet 2  . HYDROcodone-acetaminophen (NORCO) 5-325 MG per tablet Take 1 tablet by mouth 3 (three) times daily as needed for moderate pain. 60 tablet 0  . levothyroxine (SYNTHROID, LEVOTHROID) 100 MCG tablet 1 tab po daily except 1 1/2 tabs po on Thursdays and Sundays 105 tablet 3  .  ranitidine (ZANTAC) 300 MG tablet Take 1 tablet (300 mg total) by mouth at bedtime. 30 tablet 5  . escitalopram (LEXAPRO) 10 MG tablet TAKE ONE TABLET BY MOUTH ONCE DAILY 30 tablet 4  . amoxicillin (AMOXIL) 500 MG tablet Take 2 tablets (1,000 mg total) by mouth 2 (two) times daily. 40 tablet 0  . XARELTO 20 MG TABS tablet TAKE ONE TABLET BY MOUTH ONCE DAILY WITH  SUPPER 30 tablet 0   No facility-administered medications prior to visit.    No Known Allergies  Review of Systems  Constitutional: Positive for malaise/fatigue. Negative for fever and chills.  HENT: Negative for congestion and hearing loss.   Eyes: Negative for discharge.  Respiratory: Negative for cough, sputum production and shortness of breath.   Cardiovascular: Negative for chest pain, palpitations and leg swelling.  Gastrointestinal: Negative for heartburn, nausea, vomiting, abdominal  pain, diarrhea, constipation and blood in stool.  Genitourinary: Negative for dysuria, urgency, frequency and hematuria.  Musculoskeletal: Negative for myalgias, back pain and falls.  Skin: Negative for rash.  Neurological: Negative for dizziness, sensory change, loss of consciousness, weakness and headaches.  Endo/Heme/Allergies: Negative for environmental allergies. Does not bruise/bleed easily.  Psychiatric/Behavioral: Negative for depression and suicidal ideas. The patient is not nervous/anxious and does not have insomnia.        Objective:    Physical Exam  Constitutional: She is oriented to person, place, and time. She appears well-developed and well-nourished. No distress.  HENT:  Head: Normocephalic and atraumatic.  Eyes: Conjunctivae are normal.  Neck: Neck supple. No thyromegaly present.  Cardiovascular: Normal rate, regular rhythm and normal heart sounds.   No murmur heard. Pulmonary/Chest: Effort normal and breath sounds normal. No respiratory distress.  Abdominal: Soft. Bowel sounds are normal. She exhibits no distension and no mass. There is no tenderness.  Musculoskeletal: She exhibits no edema.  Lymphadenopathy:    She has no cervical adenopathy.  Neurological: She is alert and oriented to person, place, and time.  Skin: Skin is warm and dry.  Psychiatric: She has a normal mood and affect. Her behavior is normal.    BP 120/70 mmHg  Pulse 87  Temp(Src) 98 F (36.7 C) (Oral)  Ht  (1.626 m)  Wt 262 lb 12.8 oz (119.205 kg)  BMI 45.09 kg/m2  SpO2 99% Wt Readings from Last 3 Encounters:  09/02/15 262 lb 12.8 oz (119.205 kg)  05/12/15 258 lb (117.028 kg)  04/17/15 257 lb (116.574 kg)     Lab Results  Component Value Date   WBC 9.8 09/02/2015   HGB 14.2 09/02/2015   HCT 42.0 09/02/2015   PLT 374.0 09/02/2015   GLUCOSE 84 09/02/2015   CHOL 161 09/02/2015   TRIG 198.0* 09/02/2015   HDL 34.10* 09/02/2015   LDLCALC 87 09/02/2015   ALT 17 09/02/2015    AST 16 09/02/2015   NA 141 09/02/2015   K 4.3 09/02/2015   CL 104 09/02/2015   CREATININE 0.75 09/02/2015   BUN 10 09/02/2015   CO2 29 09/02/2015   TSH 2.17 09/02/2015   HGBA1C 5.8 09/02/2015    Lab Results  Component Value Date   TSH 2.17 09/02/2015   Lab Results  Component Value Date   WBC 9.8 09/02/2015   HGB 14.2 09/02/2015   HCT 42.0 09/02/2015   MCV 96.6 09/02/2015   PLT 374.0 09/02/2015   Lab Results  Component Value Date   NA 141 09/02/2015   K 4.3 09/02/2015   CO2 29 09/02/2015  GLUCOSE 84 09/02/2015   BUN 10 09/02/2015   CREATININE 0.75 09/02/2015   BILITOT 0.2 09/02/2015   ALKPHOS 58 09/02/2015   AST 16 09/02/2015   ALT 17 09/02/2015   PROT 7.4 09/02/2015   ALBUMIN 4.0 09/02/2015   CALCIUM 9.0 09/02/2015   GFR 89.43 09/02/2015   Lab Results  Component Value Date   CHOL 161 09/02/2015   Lab Results  Component Value Date   HDL 34.10* 09/02/2015   Lab Results  Component Value Date   LDLCALC 87 09/02/2015   Lab Results  Component Value Date   TRIG 198.0* 09/02/2015   Lab Results  Component Value Date   CHOLHDL 5 09/02/2015   Lab Results  Component Value Date   HGBA1C 5.8 09/02/2015       Assessment & Plan:   Problem List Items Addressed This Visit    Preventative health care    Patient encouraged to maintain heart healthy diet, regular exercise, adequate sleep. Consider daily probiotics. Take medications as prescribed. Follows with [facility]. Labs reviewed. Given and reviewed copy of ACP documents from [employer] and encouraged to complete and return      Obesity    Encouraged DASH diet, decrease po intake and increase exercise as tolerated. Needs 7-8 hours of sleep nightly. Avoid trans fats, eat small, frequent meals every 4-5 hours with lean proteins, complex carbs and healthy fats. Minimize simple carbs, GMO foods.      Hypothyroidism    On Levothyroxine, continue to monitor      Relevant Orders   CBC  (Completed)   TSH (Completed)   Hemoglobin A1c (Completed)   Lipid panel (Completed)   Comprehensive metabolic panel (Completed)   Hyperlipidemia, mixed    Encouraged heart healthy diet, increase exercise, avoid trans fats, consider a krill oil cap daily      Relevant Orders   CBC (Completed)   TSH (Completed)   Hemoglobin A1c (Completed)   Lipid panel (Completed)   Comprehensive metabolic panel (Completed)   Hyperglycemia    hgba1c acceptable, minimize simple carbs. Increase exercise as tolerated.       Relevant Orders   CBC (Completed)   TSH (Completed)   Hemoglobin A1c (Completed)   Lipid panel (Completed)   Comprehensive metabolic panel (Completed)   GERD (gastroesophageal reflux disease)    Avoid offending foods, start probiotics. Do not eat large meals in late evening and consider raising head of bed.       Relevant Orders   CBC (Completed)   TSH (Completed)   Hemoglobin A1c (Completed)   Lipid panel (Completed)   Comprehensive metabolic panel (Completed)   DVT of lower limb, acute    Has finished course of Xaralto and has had no concerning symptoms        Other Visit Diagnoses    Need for prophylactic vaccination and inoculation against influenza    -  Primary    Relevant Medications    Influenza vac split quadrivalent PF (FLUARIX) injection 0.5 mL (Completed)    Other Relevant Orders    CBC (Completed)    TSH (Completed)    Hemoglobin A1c (Completed)    Lipid panel (Completed)    Comprehensive metabolic panel (Completed)    Breast cancer screening        Relevant Orders    MM Digital Screening    CBC (Completed)    TSH (Completed)    Hemoglobin A1c (Completed)    Lipid panel (Completed)  Comprehensive metabolic panel (Completed)    Left knee pain        Relevant Orders    Ambulatory referral to Sports Medicine    CBC (Completed)    TSH (Completed)    Hemoglobin A1c (Completed)    Lipid panel (Completed)    Comprehensive metabolic panel  (Completed)      Anxiety d/o good response to Escitalopram continue the same. I have discontinued Ms. Ansley's XARELTO and amoxicillin. I have also changed her escitalopram. Additionally, I am having her maintain her cholecalciferol, cyclobenzaprine, ALPRAZolam, ranitidine, levothyroxine, and HYDROcodone-acetaminophen. We administered Influenza vac split quadrivalent PF.  Meds ordered this encounter  Medications  . Influenza vac split quadrivalent PF (FLUARIX) injection 0.5 mL    Sig:   . escitalopram (LEXAPRO) 10 MG tablet    Sig: Take 1 tablet (10 mg total) by mouth daily.    Dispense:  90 tablet    Refill:  1     [name], MD

## 2015-09-20 ENCOUNTER — Encounter: Payer: Self-pay | Admitting: Family Medicine

## 2015-09-20 ENCOUNTER — Ambulatory Visit (INDEPENDENT_AMBULATORY_CARE_PROVIDER_SITE_OTHER): Payer: 59 | Admitting: Family Medicine

## 2015-09-20 ENCOUNTER — Other Ambulatory Visit (INDEPENDENT_AMBULATORY_CARE_PROVIDER_SITE_OTHER): Payer: 59

## 2015-09-20 VITALS — BP 120/82 | HR 67 | Ht 64.0 in | Wt 260.0 lb

## 2015-09-20 DIAGNOSIS — M25562 Pain in left knee: Secondary | ICD-10-CM

## 2015-09-20 DIAGNOSIS — M23307 Other meniscus derangements, unspecified meniscus, left knee: Secondary | ICD-10-CM | POA: Insufficient documentation

## 2015-09-20 NOTE — Progress Notes (Signed)
Pre visit review using our clinic review tool, if applicable. No additional management support is needed unless otherwise documented below in the visit note. 

## 2015-09-20 NOTE — Assessment & Plan Note (Signed)
Patient was injected today and tolerated the procedure very well. We discussed icing regimen and home exercises. Patient work with Event organiser. Patient given an injection that think will be beneficial. Patient will get better shoes. Patient will come back and see me again in 4 weeks for further evaluation. If any worsening symptoms x-rays will be necessary.

## 2015-09-20 NOTE — Progress Notes (Signed)
CC: knee  Follow up.   HPI: Patient is a pleasant 44 year old female coming in followup of left knee pain Left knee pain.  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  Patient has not been seen in one year. Patient was doing very well with conservative therapy but started to stop doing the exercises on a regular basis as well as icing regimen. Patient states that unfortunately the pain is starting to increase again. Going up and downstairs seems to be the most difficult. States that it seems to be on the anterior lateral aspects of the knee. Denies giving out on her. Did respond very well to injection previously.   Past medical, surgical, family and social history reviewed. Medications reviewed all in the electronic medical record.   Review of Systems: No headache, visual changes, nausea, vomiting, diarrhea, constipation, dizziness, abdominal pain, skin rash, fevers, chills, night sweats, weight loss, swollen lymph nodes, body aches, joint swelling, muscle aches, chest pain, shortness of breath, mood changes.   Objective:    Blood pressure 120/82, pulse 67, height  (1.626 m), weight 260 lb (117.935 kg), SpO2 97 %.   General: No apparent distress alert and oriented x3 mood and affect normal, dressed appropriately. Overweight HEENT: Pupils equal, extraocular movements intact Respiratory: Patient's speak in full sentences and does not appear short of breath Cardiovascular: No lower extremity edema, non tender, no erythema Skin: Warm dry intact with no signs of infection or rash on extremities or on axial skeleton. Abdomen: Soft nontender Neuro: Cranial nerves II through XII are intact, neurovascularly intact in all extremities with 2+ DTRs and 2+ pulses. Lymph: No lymphadenopathy of posterior or anterior cervical chain or axillae bilaterally.  Gait normal with good balance and coordination.  MSK: Non tender with full range of motion and good stability and  symmetric strength and tone of elbows, wrist, hip,  and ankles bilaterally.   Knee: Left Normal to inspection with no erythema or effusion or obvious bony abnormalities. Palpation normal with no warmth, joint line tenderness, patellar tenderness, or condyle tenderness. ROM full in flexion and extension and lower leg rotation. Ligaments with solid consistent endpoints including ACL, PCL, LCL, MCL. Negative Mcmurray's, Apley's, and Thessalonian tests. Non painful patellar compression. Patellar glide without crepitus. Patellar and quadriceps tendons unremarkable. Hamstring and quadriceps strength is normal.    MSK US performed of: Left knee This study was ordered, performed, and interpreted by Terrilee Files D.O.  Knee: All structures visualized. Moderate narrowing of the medial and lateral joint lines. No significant displacement of the meniscus with some mild degenerative changes noted. Patellar Tendon unremarkable on long and transverse views without effusion. No abnormality of prepatellar bursa. LCL and MCL unremarkable on long and transverse views. Patient's medial aspect of the lateral gastroc head at its origin does have some mild hypoechoic changes it seems to be nonspecific. No true cyst appreciated.  IMPRESSION:  Chronic degenerative changes but no acute changes.    After informed written and verbal consent, patient was seated on exam table. Left knee was prepped with alcohol swab and utilizing anterolateral approach, patient's left knee space was injected with 4:1  marcaine 0.5%: Kenalog /dL. Patient tolerated the procedure well without immediate complications.  Procedure note 97110; 15 minutes spent for Therapeutic exercises as stated in above notes.  This included exercises focusing on stretching, strengthening, with significant focus on eccentric aspects.  Flexion and extension exercises were given. Focusing on vastus medialis oblique strengthening as  well as hamstring  stretching. Proper technique shown and discussed handout in great detail with ATC.  All questions were discussed and answered.    Impression and Recommendations:     This case required medical decision making of moderate complexity.

## 2015-09-20 NOTE — Patient Instructions (Signed)
Good to see you.  Ice 20 minutes 2 times daily. Usually after activity and before bed. Exercises 3 times a week.  pennsaid pinkie amount topically 2 times daily as needed.  Avoid deep squats and twisting motion Good shoes with rigid bottom.  Dierdre Harness, Merrell or New balance greater then 700 See me again in 4 weeks.

## 2015-10-20 ENCOUNTER — Ambulatory Visit: Payer: 59 | Admitting: Family Medicine

## 2015-11-22 ENCOUNTER — Other Ambulatory Visit: Payer: Self-pay | Admitting: Family Medicine

## 2015-11-22 DIAGNOSIS — E039 Hypothyroidism, unspecified: Secondary | ICD-10-CM

## 2015-11-22 MED ORDER — LEVOTHYROXINE SODIUM 100 MCG PO TABS: ORAL_TABLET | ORAL | Status: DC

## 2015-12-06 ENCOUNTER — Ambulatory Visit (INDEPENDENT_AMBULATORY_CARE_PROVIDER_SITE_OTHER): Payer: 59 | Admitting: Family

## 2015-12-06 ENCOUNTER — Encounter: Payer: Self-pay | Admitting: Family

## 2015-12-06 VITALS — BP 132/86 | HR 82 | Temp 98.2°F | Resp 18 | Ht 64.0 in | Wt 261.2 lb

## 2015-12-06 DIAGNOSIS — R21 Rash and other nonspecific skin eruption: Secondary | ICD-10-CM

## 2015-12-06 MED ORDER — PREDNISONE 20 MG PO TABS: ORAL_TABLET | ORAL | Status: DC

## 2015-12-06 NOTE — Progress Notes (Signed)
Pre visit review using our clinic review tool, if applicable. No additional management support is needed unless otherwise documented below in the visit note. 

## 2015-12-06 NOTE — Patient Instructions (Signed)
Start Prednisone. You may continue anti itch cream. Call if rash worsens or if rash is not improved in 3 days.

## 2015-12-06 NOTE — Progress Notes (Signed)
Subjective:    Patient ID: Alicia Cox, female    DOB: Jan 31, 1971, 44 y.o.   MRN: [id number]  HPI  Alicia Cox is a 44 yr old female who presents today with chief complaint of rash.  Rash is locatedon abdomen, beneath breasts and lower back.  Rash has been present x 1 week.  Using benadryl and OTC itch cream without relief. No affected family members. No change in laundry or skin care products.   Review of Systems See HPI  Past Medical History  Diagnosis Date  . Vitamin D deficiency   . Arthritis   . Blood in stool   . Chicken pox as a child  . Frequent headaches   . GERD (gastroesophageal reflux disease)   . Allergy     hay fever  . Heart murmur   . UTI (urinary tract infection)   . Hypothyroidism     Hashimoto's  . Migraine   . Chest pain 08/24/2013  . TOS (thoracic outlet syndrome) 08/25/2013  . Acute bronchitis 11/03/2013  . Knee pain, left 01/09/2014  . Obesity 11/15/2014  . Preventative health care 09/12/2015    Social History   Social History  . Marital Status: Married    Spouse Name: N/A  . Number of Children: N/A  . Years of Education: N/A   Occupational History  . Not on file.   Social History Main Topics  . Smoking status: Never Smoker   . Smokeless tobacco: Never Used     Comment: NEVER USED TOBACCO  . Alcohol Use: No  . Drug Use: No  . 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 Not on file   Social History Narrative    Past Surgical History  Procedure Laterality Date  . Tonsillectomy    . Cholecystectomy    . Appendectomy    . Endometrial ablation    . Tubal ligation  1999  . Cesarean section      twice    Family History  Problem Relation Age of Onset  . Hyperlipidemia Mother   . Hypertension Mother   . Diabetes Father     type 2  . Hypertension Sister   . Diabetes Sister   . Asthma Daughter   . Heart disease Maternal  Grandmother   . Heart attack Maternal Grandfather   . Diabetes Paternal Grandmother   . Diabetes Paternal Grandfather     No Known Allergies  Current Outpatient Prescriptions on File Prior to Visit  Medication Sig Dispense Refill  . ALPRAZolam (XANAX) 0.25 MG tablet Take 1 tablet (0.25 mg total) by mouth 2 (two) times daily as needed for anxiety. 20 tablet 0  . cholecalciferol (VITAMIN D) 1000 UNITS tablet Take 10,000 Units by mouth once a week.    . cyclobenzaprine (FLEXERIL) 10 MG tablet Take 1 tablet (10 mg total) by mouth at bedtime as needed for muscle spasms. 30 tablet 2  . escitalopram (LEXAPRO) 10 MG tablet Take 1 tablet (10 mg total) by mouth daily. 90 tablet 1  . HYDROcodone-acetaminophen (NORCO) 5-325 MG per tablet Take 1 tablet by mouth 3 (three) times daily as needed for moderate pain. 60 tablet 0  . levothyroxine (SYNTHROID, LEVOTHROID) 100 MCG tablet 1 tab po daily except 1 1/2 tabs po on Thursdays and Sundays 105 tablet 3  . ranitidine (ZANTAC) 300 MG tablet Take 1  tablet (300 mg total) by mouth at bedtime. 30 tablet 5   No current facility-administered medications on file prior to visit.    BP 132/86 mmHg  Pulse 82  Temp(Src) 98.2 F (36.8 C) (Oral)  Resp 18  Ht  (1.626 m)  Wt 261 lb 3.2 oz (118.48 kg)  BMI 44.81 kg/m2  SpO2 97%       Objective:   Physical Exam  Constitutional: She is oriented to person, place, and time. She appears well-developed and well-nourished. No distress.  Neurological: She is alert and oriented to person, place, and time.  Skin: Skin is warm and dry.  Diffuse erythematous macular rash noted on trunk, arms  Psychiatric: She has a normal mood and affect. Her behavior is normal. Judgment and thought content normal.          Assessment & Plan:  Rash-  Trial of prednisone. Advised pt to call if rash worsens or if rash does not improve. OK to continue otc anti-itch cream

## 2016-01-06 MED FILL — ESCITALOPRAM 10 MG TABLET: 10 | 30 days supply | Qty: 30 | Fill #0

## 2016-02-04 MED FILL — ESCITALOPRAM 10 MG TABLET: 10 | 30 days supply | Qty: 30 | Fill #1

## 2016-03-02 ENCOUNTER — Ambulatory Visit (INDEPENDENT_AMBULATORY_CARE_PROVIDER_SITE_OTHER): Payer: BLUE CROSS/BLUE SHIELD | Admitting: Family Medicine

## 2016-03-02 ENCOUNTER — Telehealth: Payer: Self-pay | Admitting: Family Medicine

## 2016-03-02 ENCOUNTER — Other Ambulatory Visit: Payer: Self-pay | Admitting: Family Medicine

## 2016-03-02 ENCOUNTER — Encounter: Payer: Self-pay | Admitting: Family Medicine

## 2016-03-02 VITALS — BP 126/80 | HR 77 | Temp 97.8°F | Ht 64.0 in | Wt 258.2 lb

## 2016-03-02 DIAGNOSIS — L309 Dermatitis, unspecified: Secondary | ICD-10-CM

## 2016-03-02 DIAGNOSIS — S86112A Strain of other muscle(s) and tendon(s) of posterior muscle group at lower leg level, left leg, initial encounter: Secondary | ICD-10-CM

## 2016-03-02 DIAGNOSIS — K219 Gastro-esophageal reflux disease without esophagitis: Secondary | ICD-10-CM | POA: Diagnosis not present

## 2016-03-02 DIAGNOSIS — R739 Hyperglycemia, unspecified: Secondary | ICD-10-CM | POA: Diagnosis not present

## 2016-03-02 DIAGNOSIS — R7989 Other specified abnormal findings of blood chemistry: Secondary | ICD-10-CM

## 2016-03-02 DIAGNOSIS — Z1239 Encounter for other screening for malignant neoplasm of breast: Secondary | ICD-10-CM

## 2016-03-02 DIAGNOSIS — R52 Pain, unspecified: Secondary | ICD-10-CM

## 2016-03-02 DIAGNOSIS — Z0000 Encounter for general adult medical examination without abnormal findings: Secondary | ICD-10-CM

## 2016-03-02 DIAGNOSIS — E039 Hypothyroidism, unspecified: Secondary | ICD-10-CM

## 2016-03-02 DIAGNOSIS — E782 Mixed hyperlipidemia: Secondary | ICD-10-CM

## 2016-03-02 HISTORY — DX: Dermatitis, unspecified: L30.9

## 2016-03-02 MED ORDER — RANITIDINE HCL 300 MG PO TABS: 300.0000 mg | ORAL_TABLET | Freq: Every day | ORAL | Status: DC

## 2016-03-02 MED ORDER — TRIAMCINOLONE ACETONIDE 0.1 % EX CREA: 1.0000 "application " | TOPICAL_CREAM | Freq: Two times a day (BID) | CUTANEOUS | Status: DC | PRN

## 2016-03-02 MED ORDER — HYDROCODONE-ACETAMINOPHEN 5-325 MG PO TABS: 1.0000 | ORAL_TABLET | Freq: Three times a day (TID) | ORAL | Status: DC | PRN

## 2016-03-02 MED ORDER — MONTELUKAST SODIUM 10 MG PO TABS: 10.0000 mg | ORAL_TABLET | Freq: Every day | ORAL | Status: DC

## 2016-03-02 MED FILL — HYDROCODON-APAP 5-325: 5-325 | 20 days supply | Qty: 60 | Fill #0

## 2016-03-02 MED FILL — ESCITALOPRAM 10 MG TABLET: 10 | 30 days supply | Qty: 30 | Fill #2

## 2016-03-02 MED FILL — raNITIdine HCL 300 MG TABS: 300 | 30 days supply | Qty: 30 | Fill #0

## 2016-03-02 MED FILL — MONTELUKAST SOD 10 MG TAB: 10 | 30 days supply | Qty: 30 | Fill #0

## 2016-03-02 MED FILL — TRIAMCINOLONE 0.1% CREAM: 0.1 | 15 days supply | Qty: 30 | Fill #0

## 2016-03-02 NOTE — Assessment & Plan Note (Signed)
hgba1c acceptable, minimize simple carbs. Increase exercise as tolerated. Continue current meds 

## 2016-03-02 NOTE — Patient Instructions (Addendum)
Elderberry, Aged garlic, Vitamin C, and Zinc Vitamins.  NOW Probiotic Vitamin. Available at [facility]  Contact Dermatitis Dermatitis is redness, soreness, and swelling (inflammation) of the skin. Contact dermatitis is a reaction to certain substances that touch the skin. There are two types of contact dermatitis:   Irritant contact dermatitis. This type is caused by something that irritates your skin, such as dry hands from washing them too much. This type does not require previous exposure to the substance for a reaction to occur. This type is more common.  Allergic contact dermatitis. This type is caused by a substance that you are allergic to, such as a nickel allergy or poison ivy. This type only occurs if you have been exposed to the substance (allergen) before. Upon a repeat exposure, your body reacts to the substance. This type is less common. CAUSES  Many different substances can cause contact dermatitis. Irritant contact dermatitis is most commonly caused by exposure to:   Makeup.   Soaps.   Detergents.   Bleaches.   Acids.   Metal salts, such as nickel.  Allergic contact dermatitis is most commonly caused by exposure to:   Poisonous plants.   Chemicals.   Jewelry.   Latex.   Medicines.   Preservatives in products, such as clothing.  RISK FACTORS This condition is more likely to develop in:   People who have jobs that expose them to irritants or allergens.  People who have certain medical conditions, such as asthma or eczema.  SYMPTOMS  Symptoms of this condition may occur anywhere on your body where the irritant has touched you or is touched by you. Symptoms include:  Dryness or flaking.   Redness.   Cracks.   Itching.   Pain or a burning feeling.   Blisters.  Drainage of small amounts of blood or clear fluid from skin cracks. With allergic contact dermatitis, there may also be swelling in areas such as the eyelids, mouth,  or genitals.  DIAGNOSIS  This condition is diagnosed with a medical history and physical exam. A patch skin test may be performed to help determine the cause. If the condition is related to your job, you may need to see an occupational medicine specialist. TREATMENT Treatment for this condition includes figuring out what caused the reaction and protecting your skin from further contact. Treatment may also include:   Steroid creams or ointments. Oral steroid medicines may be needed in more severe cases.  Antibiotics or antibacterial ointments, if a skin infection is present.  Antihistamine lotion or an antihistamine taken by mouth to ease itching.  A bandage (dressing). HOME CARE INSTRUCTIONS Skin Care  Moisturize your skin as needed.   Apply cool compresses to the affected areas.  Try taking a bath with:  Epsom salts. Follow the instructions on the packaging. You can get these at your local pharmacy or grocery store.  Baking soda. Pour a small amount into the bath as directed by your health care provider.  Colloidal oatmeal. Follow the instructions on the packaging. You can get this at your local pharmacy or grocery store.  Try applying baking soda paste to your skin. Stir water into baking soda until it reaches a paste-like consistency.  Do not scratch your skin.  Bathe less frequently, such as every other day.  Bathe in lukewarm water. Avoid using hot water. Medicines  Take or apply over-the-counter and prescription medicines only as told by your health care provider.   If you were prescribed an antibiotic medicine,  take or apply your antibiotic as told by your health care provider. Do not stop using the antibiotic even if your condition starts to improve. General Instructions  Keep all follow-up visits as told by your health care provider. This is important.  Avoid the substance that caused your reaction. If you do not know what caused it, keep a journal to try to  track what caused it. Write down:  What you eat.  What cosmetic products you use.  What you drink.  What you wear in the affected area. This includes jewelry.  If you were given a dressing, take care of it as told by your health care provider. This includes when to change and remove it. SEEK MEDICAL CARE IF:   Your condition does not improve with treatment.  Your condition gets worse.  You have signs of infection such as swelling, tenderness, redness, soreness, or warmth in the affected area.  You have a fever.  You have new symptoms. SEEK IMMEDIATE MEDICAL CARE IF:   You have a severe headache, neck pain, or neck stiffness.  You vomit.  You feel very sleepy.  You notice red streaks coming from the affected area.  Your bone or joint underneath the affected area becomes painful after the skin has healed.  The affected area turns darker.  You have difficulty breathing.   This information is not intended to replace advice given to you by your health care provider. Make sure you discuss any questions you have with your health care provider.   Document Released: 12/01/2000 Document Revised: 08/25/2015 Document Reviewed: 04/21/2015 Elsevier Interactive Patient Education [employer]2016 [employer] Inc.

## 2016-03-02 NOTE — Assessment & Plan Note (Signed)
Encouraged heart healthy diet, increase exercise, avoid trans fats, consider a krill oil cap daily 

## 2016-03-02 NOTE — Telephone Encounter (Signed)
Refill done.  

## 2016-03-02 NOTE — Assessment & Plan Note (Signed)
Encouraged DASH diet, decrease po intake and increase exercise as tolerated. Needs 7-8 hours of sleep nightly. Avoid trans fats, eat small, frequent meals every 4-5 hours with lean proteins, complex carbs and healthy fats. Minimize simple carbs 

## 2016-03-02 NOTE — Telephone Encounter (Signed)
[facility] requesting refill on   ranitidine (ZANTAC) 300 MG tablet [[id number]][[id number]]

## 2016-03-02 NOTE — Progress Notes (Signed)
Pre visit review using our clinic review tool, if applicable. No additional management support is needed unless otherwise documented below in the visit note. 

## 2016-03-02 NOTE — Assessment & Plan Note (Signed)
Avoid offending foods, start probiotics. Do not eat large meals in late evening and consider raising head of bed.  

## 2016-03-02 NOTE — Assessment & Plan Note (Signed)
On Levothyroxine, continue to monitor 

## 2016-03-02 NOTE — Assessment & Plan Note (Signed)
Taking Zyrtec twice daily as needed, Flonase. Add nasal saline. Add singulair as needed

## 2016-03-02 NOTE — Progress Notes (Signed)
Subjective:    Patient ID: Alicia Cox, female    DOB: [birth year], 45 y.o.   MRN: [id number]  Chief Complaint  Patient presents with  . Follow-up    HPI Patient is in today for follow up.  Patient has been having congestion and has been using sinus rinse and nasal spray and otc zyrtec.  Denies fevers/chills. Is noting persistent back pain. No injury or fall. Denies CP/palp/SOB/HA/congestion/fevers/GI or GU c/o. Taking meds as prescribed    Past Medical History  Diagnosis Date  . Vitamin D deficiency   . Arthritis   . Blood in stool   . Chicken pox as a child  . Frequent headaches   . GERD (gastroesophageal reflux disease)   . Allergy     hay fever  . Heart murmur   . UTI (urinary tract infection)   . Hypothyroidism     Hashimoto's  . Migraine   . Chest pain 08/24/2013  . TOS (thoracic outlet syndrome) 08/25/2013  . Acute bronchitis 11/03/2013  . Knee pain, left 01/09/2014  . Obesity 11/15/2014  . Preventative health care 09/12/2015  . Dermatitis 03/02/2016    Past Surgical History  Procedure Laterality Date  . Tonsillectomy    . Cholecystectomy    . Appendectomy    . Endometrial ablation    . Tubal ligation  1999  . Cesarean section      twice    Family History  Problem Relation Age of Onset  . Hyperlipidemia Mother   . Hypertension Mother   . Diabetes Father     type 2  . Hypertension Sister   . Diabetes Sister   . Asthma Daughter   . Heart disease Maternal Grandmother   . Heart attack Maternal Grandfather   . Diabetes Paternal Grandmother   . Diabetes Paternal Grandfather     Social History   Social History  . Marital Status: Married    Spouse Name: N/A  . Number of Children: N/A  . Years of Education: N/A   Occupational History  . Not on file.   Social History Main Topics  . Smoking status: Never Smoker   . Smokeless tobacco: Never Used     Comment: NEVER USED TOBACCO  . Alcohol Use: No  . Drug Use: No  . Sexual Activity:   Partners: Male     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, no dietary restrictions, 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   Other Topics Concern  . Not on file   Social History Narrative    Outpatient Prescriptions Prior to Visit  Medication Sig Dispense Refill  . ALPRAZolam (XANAX) 0.25 MG tablet Take 1 tablet (0.25 mg total) by mouth 2 (two) times daily as needed for anxiety. 20 tablet 0  . cholecalciferol (VITAMIN D) 1000 UNITS tablet Take 10,000 Units by mouth once a week.    . cyclobenzaprine (FLEXERIL) 10 MG tablet Take 1 tablet (10 mg total) by mouth at bedtime as needed for muscle spasms. 30 tablet 2  . escitalopram (LEXAPRO) 10 MG tablet Take 1 tablet (10 mg total) by mouth daily. 90 tablet 1  . levothyroxine (SYNTHROID, LEVOTHROID) 100 MCG tablet 1 tab po daily except 1 1/2 tabs po on Thursdays and Sundays 105 tablet 3  . HYDROcodone-acetaminophen (NORCO) 5-325 MG per tablet Take 1 tablet by mouth 3 (three) times daily as needed for moderate pain. 60 tablet 0  . ranitidine (ZANTAC) 300 MG tablet Take 1 tablet (300 mg total) by mouth at bedtime.  30 tablet 5  . predniSONE (DELTASONE) 20 MG tablet 2 tabs by mouth once daily for 4 days 10 tablet 0   No facility-administered medications prior to visit.    No Known Allergies  ROS     Objective:    Physical Exam  BP 126/80 mmHg  Pulse 77  Temp(Src) 97.8 F (36.6 C) (Oral)  Ht  (1.626 m)  Wt 258 lb 4 oz (117.141 kg)  BMI 44.31 kg/m2  SpO2 99% Wt Readings from Last 3 Encounters:  03/02/16 258 lb 4 oz (117.141 kg)  12/06/15 261 lb 3.2 oz (118.48 kg)  09/20/15 260 lb (117.935 kg)     Lab Results  Component Value Date   WBC 9.8 09/02/2015   HGB 14.2 09/02/2015   HCT 42.0 09/02/2015   PLT 374.0 09/02/2015   GLUCOSE 84 09/02/2015   CHOL 161 09/02/2015   TRIG 198.0* 09/02/2015   HDL 34.10* 09/02/2015   LDLCALC 87 09/02/2015   ALT 17 09/02/2015   AST 16 09/02/2015   NA 141 09/02/2015   K 4.3 09/02/2015   CL 104 09/02/2015    CREATININE 0.75 09/02/2015   BUN 10 09/02/2015   CO2 29 09/02/2015   TSH 2.17 09/02/2015   HGBA1C 5.8 09/02/2015    Lab Results  Component Value Date   TSH 2.17 09/02/2015   Lab Results  Component Value Date   WBC 9.8 09/02/2015   HGB 14.2 09/02/2015   HCT 42.0 09/02/2015   MCV 96.6 09/02/2015   PLT 374.0 09/02/2015   Lab Results  Component Value Date   NA 141 09/02/2015   K 4.3 09/02/2015   CO2 29 09/02/2015   GLUCOSE 84 09/02/2015   BUN 10 09/02/2015   CREATININE 0.75 09/02/2015   BILITOT 0.2 09/02/2015   ALKPHOS 58 09/02/2015   AST 16 09/02/2015   ALT 17 09/02/2015   PROT 7.4 09/02/2015   ALBUMIN 4.0 09/02/2015   CALCIUM 9.0 09/02/2015   GFR 89.43 09/02/2015   Lab Results  Component Value Date   CHOL 161 09/02/2015   Lab Results  Component Value Date   HDL 34.10* 09/02/2015   Lab Results  Component Value Date   LDLCALC 87 09/02/2015   Lab Results  Component Value Date   TRIG 198.0* 09/02/2015   Lab Results  Component Value Date   CHOLHDL 5 09/02/2015   Lab Results  Component Value Date   HGBA1C 5.8 09/02/2015       Assessment & Plan:   Problem List Items Addressed This Visit    Gastrocnemius strain, left   Relevant Medications   HYDROcodone-acetaminophen (NORCO) 5-325 MG tablet   Other Relevant Orders   CBC   TSH   Lipid panel   Hemoglobin A1c   Comprehensive metabolic panel   GERD (gastroesophageal reflux disease)    Avoid offending foods, start probiotics. Do not eat large meals in late evening and consider raising head of bed.       Relevant Medications   HYDROcodone-acetaminophen (NORCO) 5-325 MG tablet   Other Relevant Orders   CBC   TSH   Lipid panel   Hemoglobin A1c   Comprehensive metabolic panel   Hyperglycemia    hgba1c acceptable, minimize simple carbs. Increase exercise as tolerated. Continue current meds      Relevant Medications   HYDROcodone-acetaminophen (NORCO) 5-325 MG tablet   Other Relevant Orders    CBC   TSH   Lipid panel   Hemoglobin A1c   Comprehensive  metabolic panel   Hyperlipidemia, mixed    Encouraged heart healthy diet, increase exercise, avoid trans fats, consider a krill oil cap daily      Relevant Medications   HYDROcodone-acetaminophen (NORCO) 5-325 MG tablet   Other Relevant Orders   CBC   TSH   Lipid panel   Hemoglobin A1c   Comprehensive metabolic panel   Hypothyroidism    On Levothyroxine, continue to monitor      Relevant Medications   HYDROcodone-acetaminophen (NORCO) 5-325 MG tablet   Other Relevant Orders   CBC   TSH   Lipid panel   Hemoglobin A1c   Comprehensive metabolic panel   Preventative health care   Relevant Medications   HYDROcodone-acetaminophen (NORCO) 5-325 MG tablet   Other Relevant Orders   CBC   TSH   Lipid panel   Hemoglobin A1c   Comprehensive metabolic panel    Other Visit Diagnoses    Breast cancer screening    -  Primary    Relevant Orders    MM Digital Diagnostic Bilat    Pain        Relevant Medications    HYDROcodone-acetaminophen (NORCO) 5-325 MG tablet    Other Relevant Orders    CBC    TSH    Lipid panel    Hemoglobin A1c    Comprehensive metabolic panel       I have discontinued Ms. Kouba's predniSONE. I have also changed her HYDROcodone-acetaminophen. Additionally, I am having her start on montelukast and triamcinolone cream. Lastly, I am having her maintain her cholecalciferol, cyclobenzaprine, ALPRAZolam, escitalopram, and levothyroxine.  Meds ordered this encounter  Medications  . HYDROcodone-acetaminophen (NORCO) 5-325 MG tablet    Sig: Take 1 tablet by mouth 3 (three) times daily as needed for moderate pain.    Dispense:  60 tablet    Refill:  0  . montelukast (SINGULAIR) 10 MG tablet    Sig: Take 1 tablet (10 mg total) by mouth at bedtime.    Dispense:  30 tablet    Refill:  2  . triamcinolone cream (KENALOG) 0.1 %    Sig: Apply 1 application topically 2 (two) times daily as needed.     Dispense:  30 g    Refill:  0     [name], MD

## 2016-03-03 LAB — LIPID PANEL
Cholesterol: 161 mg/dL (ref 0–200)
HDL: 33.1 mg/dL — AB (ref >39.00)
NONHDL: 127.97
Total CHOL/HDL Ratio: 5
Triglycerides: 256 mg/dL — ABNORMAL HIGH (ref 0.0–149.0)
VLDL: 51.2 mg/dL — AB (ref 0.0–40.0)

## 2016-03-03 LAB — COMPREHENSIVE METABOLIC PANEL
ALT: 15 U/L (ref 0–35)
AST: 12 U/L (ref 0–37)
Albumin: 4 g/dL (ref 3.5–5.2)
Alkaline Phosphatase: 63 U/L (ref 39–117)
BUN: 8 mg/dL (ref 6–23)
CHLORIDE: 104 meq/L (ref 96–112)
CO2: 28 meq/L (ref 19–32)
Calcium: 9.2 mg/dL (ref 8.4–10.5)
Creatinine, Ser: 0.73 mg/dL (ref 0.40–1.20)
GFR: 92.05 mL/min (ref >60.00)
GLUCOSE: 104 mg/dL — AB (ref 70–99)
POTASSIUM: 4.2 meq/L (ref 3.5–5.1)
SODIUM: 139 meq/L (ref 135–145)
Total Bilirubin: 0.3 mg/dL (ref 0.2–1.2)
Total Protein: 7.4 g/dL (ref 6.0–8.3)

## 2016-03-03 LAB — TSH: TSH: 0.85 u[IU]/mL (ref 0.35–4.50)

## 2016-03-03 LAB — CBC
HEMATOCRIT: 42.9 % (ref 36.0–46.0)
HEMOGLOBIN: 14.4 g/dL (ref 12.0–15.0)
MCHC: 33.6 g/dL (ref 30.0–36.0)
MCV: 96 fl (ref 78.0–100.0)
Platelets: 366 10*3/uL (ref 150.0–400.0)
RBC: 4.47 Mil/uL (ref 3.87–5.11)
RDW: 12.6 % (ref 11.5–15.5)
WBC: 11.6 10*3/uL — AB (ref 4.0–10.5)

## 2016-03-03 LAB — LDL CHOLESTEROL, DIRECT: LDL DIRECT: 100 mg/dL

## 2016-03-03 LAB — HEMOGLOBIN A1C: HEMOGLOBIN A1C: 5.9 % (ref 4.6–6.5)

## 2016-04-03 ENCOUNTER — Other Ambulatory Visit: Payer: Self-pay | Admitting: Family Medicine

## 2016-04-03 MED FILL — ESCITALOPRAM 10 MG TABLET: 10 | 30 days supply | Qty: 30 | Fill #0

## 2016-04-03 MED FILL — MONTELUKAST SOD 10 MG TAB: 10 | 30 days supply | Qty: 30 | Fill #1

## 2016-05-08 MED FILL — MONTELUKAST SOD 10 MG TAB: 10 | 30 days supply | Qty: 30 | Fill #2

## 2016-05-08 MED FILL — ESCITALOPRAM 10 MG TABLET: 10 | 30 days supply | Qty: 30 | Fill #1

## 2016-06-07 MED FILL — ESCITALOPRAM 10 MG TABLET: 10 | 30 days supply | Qty: 30 | Fill #2

## 2016-07-07 MED FILL — ESCITALOPRAM 10 MG TABLET: 10 | 30 days supply | Qty: 30 | Fill #3

## 2016-08-04 ENCOUNTER — Other Ambulatory Visit: Payer: Self-pay | Admitting: Family Medicine

## 2016-08-04 MED FILL — ESCITALOPRAM 10 MG TABLET: 10 | 30 days supply | Qty: 30 | Fill #0

## 2016-09-06 MED FILL — ESCITALOPRAM 10 MG TABLET: 10 | 30 days supply | Qty: 30 | Fill #1

## 2016-09-14 ENCOUNTER — Ambulatory Visit (INDEPENDENT_AMBULATORY_CARE_PROVIDER_SITE_OTHER): Payer: BLUE CROSS/BLUE SHIELD | Admitting: Family Medicine

## 2016-09-14 ENCOUNTER — Encounter: Payer: Self-pay | Admitting: Family Medicine

## 2016-09-14 ENCOUNTER — Other Ambulatory Visit (HOSPITAL_COMMUNITY)
Admission: RE | Admit: 2016-09-14 | Discharge: 2016-09-14 | Disposition: A | Discharge status: Home / Self Care | Payer: BLUE CROSS/BLUE SHIELD | Source: Ambulatory Visit | Attending: Family Medicine | Admitting: Family Medicine

## 2016-09-14 VITALS — BP 112/82 | HR 88 | Temp 98.2°F | Wt 260.2 lb

## 2016-09-14 DIAGNOSIS — Z0000 Encounter for general adult medical examination without abnormal findings: Secondary | ICD-10-CM

## 2016-09-14 DIAGNOSIS — Z113 Encounter for screening for infections with a predominantly sexual mode of transmission: Secondary | ICD-10-CM | POA: Diagnosis present

## 2016-09-14 DIAGNOSIS — E782 Mixed hyperlipidemia: Secondary | ICD-10-CM

## 2016-09-14 DIAGNOSIS — Z1151 Encounter for screening for human papillomavirus (HPV): Secondary | ICD-10-CM | POA: Insufficient documentation

## 2016-09-14 DIAGNOSIS — Z23 Encounter for immunization: Secondary | ICD-10-CM | POA: Diagnosis not present

## 2016-09-14 DIAGNOSIS — T7840XS Allergy, unspecified, sequela: Secondary | ICD-10-CM

## 2016-09-14 DIAGNOSIS — Z124 Encounter for screening for malignant neoplasm of cervix: Secondary | ICD-10-CM | POA: Insufficient documentation

## 2016-09-14 DIAGNOSIS — N760 Acute vaginitis: Secondary | ICD-10-CM | POA: Diagnosis present

## 2016-09-14 DIAGNOSIS — E669 Obesity, unspecified: Secondary | ICD-10-CM

## 2016-09-14 DIAGNOSIS — E039 Hypothyroidism, unspecified: Secondary | ICD-10-CM

## 2016-09-14 DIAGNOSIS — Z01419 Encounter for gynecological examination (general) (routine) without abnormal findings: Secondary | ICD-10-CM | POA: Insufficient documentation

## 2016-09-14 DIAGNOSIS — R739 Hyperglycemia, unspecified: Secondary | ICD-10-CM | POA: Diagnosis not present

## 2016-09-14 DIAGNOSIS — F411 Generalized anxiety disorder: Secondary | ICD-10-CM

## 2016-09-14 DIAGNOSIS — Z1239 Encounter for other screening for malignant neoplasm of breast: Secondary | ICD-10-CM

## 2016-09-14 DIAGNOSIS — L309 Dermatitis, unspecified: Secondary | ICD-10-CM

## 2016-09-14 HISTORY — DX: Encounter for screening for malignant neoplasm of cervix: Z12.4

## 2016-09-14 MED ORDER — NYSTATIN 100000 UNIT/GM EX CREA: 1.0000 "application " | TOPICAL_CREAM | Freq: Two times a day (BID) | CUTANEOUS | 1 refills | Status: DC

## 2016-09-14 MED FILL — NYSTATIN 100,000 UNIT/GM CR: 100000 | 15 days supply | Qty: 30 | Fill #0

## 2016-09-14 NOTE — Progress Notes (Signed)
Pre visit review using our clinic review tool, if applicable. No additional management support is needed unless otherwise documented below in the visit note. 

## 2016-09-14 NOTE — Patient Instructions (Signed)
Preventive Care for Adults, Female A healthy lifestyle and preventive care can promote health and wellness. Preventive health guidelines for women include the following key practices.  A routine yearly physical is a good way to check with your health care provider about your health and preventive screening. It is a chance to share any concerns and updates on your health and to receive a thorough exam.  Visit your dentist for a routine exam and preventive care every 6 months. Brush your teeth twice a day and floss once a day. Good oral hygiene prevents tooth decay and gum disease.  The frequency of eye exams is based on your age, health, family medical history, use of contact lenses, and other factors. Follow your health care provider's recommendations for frequency of eye exams.  Eat a healthy diet. Foods like vegetables, fruits, whole grains, low-fat dairy products, and lean protein foods contain the nutrients you need without too many calories. Decrease your intake of foods high in solid fats, added sugars, and salt. Eat the right amount of calories for you.Get information about a proper diet from your health care provider, if necessary.  Regular physical exercise is one of the most important things you can do for your health. Most adults should get at least 150 minutes of moderate-intensity exercise (any activity that increases your heart rate and causes you to sweat) each week. In addition, most adults need muscle-strengthening exercises on 2 or more days a week.  Maintain a healthy weight. The body mass index (BMI) is a screening tool to identify possible weight problems. It provides an estimate of body fat based on height and weight. Your health care provider can find your BMI and can help you achieve or maintain a healthy weight.For adults 20 years and older:  A BMI below 18.5 is considered underweight.  A BMI of 18.5 to 24.9 is normal.  A BMI of 25 to 29.9 is considered overweight.  A  BMI of 30 and above is considered obese.  Maintain normal blood lipids and cholesterol levels by exercising and minimizing your intake of saturated fat. Eat a balanced diet with plenty of fruit and vegetables. Blood tests for lipids and cholesterol should begin at age 45 and be repeated every 5 years. If your lipid or cholesterol levels are high, you are over 50, or you are at high risk for heart disease, you may need your cholesterol levels checked more frequently.Ongoing high lipid and cholesterol levels should be treated with medicines if diet and exercise are not working.  If you smoke, find out from your health care provider how to quit. If you do not use tobacco, do not start.  Lung cancer screening is recommended for adults aged 45-80 years who are at high risk for developing lung cancer because of a history of smoking. A yearly low-dose CT scan of the lungs is recommended for people who have at least a 30-pack-year history of smoking and are a current smoker or have quit within the past 15 years. A pack year of smoking is smoking an average of 1 pack of cigarettes a day for 1 year (for example: 1 pack a day for 30 years or 2 packs a day for 15 years). Yearly screening should continue until the smoker has stopped smoking for at least 15 years. Yearly screening should be stopped for people who develop a health problem that would prevent them from having lung cancer treatment.  If you are pregnant, do not drink alcohol. If you are  breastfeeding, be very cautious about drinking alcohol. If you are not pregnant and choose to drink alcohol, do not have more than 1 drink per day. One drink is considered to be 12 ounces (355 mL) of beer, 5 ounces (148 mL) of wine, or 1.5 ounces (44 mL) of liquor.  Avoid use of street drugs. Do not share needles with anyone. Ask for help if you need support or instructions about stopping the use of drugs.  High blood pressure causes heart disease and increases the risk  of stroke. Your blood pressure should be checked at least every 1 to 2 years. Ongoing high blood pressure should be treated with medicines if weight loss and exercise do not work.  If you are 55-79 years old, ask your health care provider if you should take aspirin to prevent strokes.  Diabetes screening is done by taking a blood sample to check your blood glucose level after you have not eaten for a certain period of time (fasting). If you are not overweight and you do not have risk factors for diabetes, you should be screened once every 3 years starting at age 45. If you are overweight or obese and you are 40-70 years of age, you should be screened for diabetes every year as part of your cardiovascular risk assessment.  Breast cancer screening is essential preventive care for women. You should practice "breast self-awareness." This means understanding the normal appearance and feel of your breasts and may include breast self-examination. Any changes detected, no matter how small, should be reported to a health care provider. Women in their 20s and 30s should have a clinical breast exam (CBE) by a health care provider as part of a regular health exam every 1 to 3 years. After age 40, women should have a CBE every year. Starting at age 40, women should consider having a mammogram (breast X-ray test) every year. Women who have a family history of breast cancer should talk to their health care provider about genetic screening. Women at a high risk of breast cancer should talk to their health care providers about having an MRI and a mammogram every year.  Breast cancer gene (BRCA)-related cancer risk assessment is recommended for women who have family members with BRCA-related cancers. BRCA-related cancers include breast, ovarian, tubal, and peritoneal cancers. Having family members with these cancers may be associated with an increased risk for harmful changes (mutations) in the breast cancer genes BRCA1 and  BRCA2. Results of the assessment will determine the need for genetic counseling and BRCA1 and BRCA2 testing.  Your health care provider may recommend that you be screened regularly for cancer of the pelvic organs (ovaries, uterus, and vagina). This screening involves a pelvic examination, including checking for microscopic changes to the surface of your cervix (Pap test). You may be encouraged to have this screening done every 3 years, beginning at age 21.  For women ages 30-65, health care providers may recommend pelvic exams and Pap testing every 3 years, or they may recommend the Pap and pelvic exam, combined with testing for human papilloma virus (HPV), every 5 years. Some types of HPV increase your risk of cervical cancer. Testing for HPV may also be done on women of any age with unclear Pap test results.  Other health care providers may not recommend any screening for nonpregnant women who are considered low risk for pelvic cancer and who do not have symptoms. Ask your health care provider if a screening pelvic exam is right for   you.  If you have had past treatment for cervical cancer or a condition that could lead to cancer, you need Pap tests and screening for cancer for at least 20 years after your treatment. If Pap tests have been discontinued, your risk factors (such as having a new sexual partner) need to be reassessed to determine if screening should resume. Some women have medical problems that increase the chance of getting cervical cancer. In these cases, your health care provider may recommend more frequent screening and Pap tests.  Colorectal cancer can be detected and often prevented. Most routine colorectal cancer screening begins at the age of 50 years and continues through age 75 years. However, your health care provider may recommend screening at an earlier age if you have risk factors for colon cancer. On a yearly basis, your health care provider may provide home test kits to check  for hidden blood in the stool. Use of a small camera at the end of a tube, to directly examine the colon (sigmoidoscopy or colonoscopy), can detect the earliest forms of colorectal cancer. Talk to your health care provider about this at age 50, when routine screening begins. Direct exam of the colon should be repeated every 5-10 years through age 75 years, unless early forms of precancerous polyps or small growths are found.  People who are at an increased risk for hepatitis B should be screened for this virus. You are considered at high risk for hepatitis B if:  You were born in a country where hepatitis B occurs often. Talk with your health care provider about which countries are considered high risk.  Your parents were born in a high-risk country and you have not received a shot to protect against hepatitis B (hepatitis B vaccine).  You have HIV or AIDS.  You use needles to inject street drugs.  You live with, or have sex with, someone who has hepatitis B.  You get hemodialysis treatment.  You take certain medicines for conditions like cancer, organ transplantation, and autoimmune conditions.  Hepatitis C blood testing is recommended for all people born from 1945 through 1965 and any individual with known risks for hepatitis C.  Practice safe sex. Use condoms and avoid high-risk sexual practices to reduce the spread of sexually transmitted infections (STIs). STIs include gonorrhea, chlamydia, syphilis, trichomonas, herpes, HPV, and human immunodeficiency virus (HIV). Herpes, HIV, and HPV are viral illnesses that have no cure. They can result in disability, cancer, and death.  You should be screened for sexually transmitted illnesses (STIs) including gonorrhea and chlamydia if:  You are sexually active and are younger than 24 years.  You are older than 24 years and your health care provider tells you that you are at risk for this type of infection.  Your sexual activity has changed  since you were last screened and you are at an increased risk for chlamydia or gonorrhea. Ask your health care provider if you are at risk.  If you are at risk of being infected with HIV, it is recommended that you take a prescription medicine daily to prevent HIV infection. This is called preexposure prophylaxis (PrEP). You are considered at risk if:  You are sexually active and do not regularly use condoms or know the HIV status of your partner(s).  You take drugs by injection.  You are sexually active with a partner who has HIV.  Talk with your health care provider about whether you are at high risk of being infected with HIV. If   you choose to begin PrEP, you should first be tested for HIV. You should then be tested every 3 months for as long as you are taking PrEP.  Osteoporosis is a disease in which the bones lose minerals and strength with aging. This can result in serious bone fractures or breaks. The risk of osteoporosis can be identified using a bone density scan. Women ages 67 years and over and women at risk for fractures or osteoporosis should discuss screening with their health care providers. Ask your health care provider whether you should take a calcium supplement or vitamin D to reduce the rate of osteoporosis.  Menopause can be associated with physical symptoms and risks. Hormone replacement therapy is available to decrease symptoms and risks. You should talk to your health care provider about whether hormone replacement therapy is right for you.  Use sunscreen. Apply sunscreen liberally and repeatedly throughout the day. You should seek shade when your shadow is shorter than you. Protect yourself by wearing long sleeves, pants, a wide-brimmed hat, and sunglasses year round, whenever you are outdoors.  Once a month, do a whole body skin exam, using a mirror to look at the skin on your back. Tell your health care provider of new moles, moles that have irregular borders, moles that  are larger than a pencil eraser, or moles that have changed in shape or color.  Stay current with required vaccines (immunizations).  Influenza vaccine. All adults should be immunized every year.  Tetanus, diphtheria, and acellular pertussis (Td, Tdap) vaccine. Pregnant women should receive 1 dose of Tdap vaccine during each pregnancy. The dose should be obtained regardless of the length of time since the last dose. Immunization is preferred during the 27th-36th week of gestation. An adult who has not previously received Tdap or who does not know her vaccine status should receive 1 dose of Tdap. This initial dose should be followed by tetanus and diphtheria toxoids (Td) booster doses every 10 years. Adults with an unknown or incomplete history of completing a 3-dose immunization series with Td-containing vaccines should begin or complete a primary immunization series including a Tdap dose. Adults should receive a Td booster every 10 years.  Varicella vaccine. An adult without evidence of immunity to varicella should receive 2 doses or a second dose if she has previously received 1 dose. Pregnant females who do not have evidence of immunity should receive the first dose after pregnancy. This first dose should be obtained before leaving the health care facility. The second dose should be obtained 4-8 weeks after the first dose.  Human papillomavirus (HPV) vaccine. Females aged 13-26 years who have not received the vaccine previously should obtain the 3-dose series. The vaccine is not recommended for use in pregnant females. However, pregnancy testing is not needed before receiving a dose. If a female is found to be pregnant after receiving a dose, no treatment is needed. In that case, the remaining doses should be delayed until after the pregnancy. Immunization is recommended for any person with an immunocompromised condition through the age of 61 years if she did not get any or all doses earlier. During the  3-dose series, the second dose should be obtained 4-8 weeks after the first dose. The third dose should be obtained 24 weeks after the first dose and 16 weeks after the second dose.  Zoster vaccine. One dose is recommended for adults aged 30 years or older unless certain conditions are present.  Measles, mumps, and rubella (MMR) vaccine. Adults born  before 1957 generally are considered immune to measles and mumps. Adults born in 1957 or later should have 1 or more doses of MMR vaccine unless there is a contraindication to the vaccine or there is laboratory evidence of immunity to each of the three diseases. A routine second dose of MMR vaccine should be obtained at least 28 days after the first dose for students attending postsecondary schools, health care workers, or international travelers. People who received inactivated measles vaccine or an unknown type of measles vaccine during 1963-1967 should receive 2 doses of MMR vaccine. People who received inactivated mumps vaccine or an unknown type of mumps vaccine before 1979 and are at high risk for mumps infection should consider immunization with 2 doses of MMR vaccine. For females of childbearing age, rubella immunity should be determined. If there is no evidence of immunity, females who are not pregnant should be vaccinated. If there is no evidence of immunity, females who are pregnant should delay immunization until after pregnancy. Unvaccinated health care workers born before 1957 who lack laboratory evidence of measles, mumps, or rubella immunity or laboratory confirmation of disease should consider measles and mumps immunization with 2 doses of MMR vaccine or rubella immunization with 1 dose of MMR vaccine.  Pneumococcal 13-valent conjugate (PCV13) vaccine. When indicated, a person who is uncertain of his immunization history and has no record of immunization should receive the PCV13 vaccine. All adults 65 years of age and older should receive this  vaccine. An adult aged 19 years or older who has certain medical conditions and has not been previously immunized should receive 1 dose of PCV13 vaccine. This PCV13 should be followed with a dose of pneumococcal polysaccharide (PPSV23) vaccine. Adults who are at high risk for pneumococcal disease should obtain the PPSV23 vaccine at least 8 weeks after the dose of PCV13 vaccine. Adults older than 44 years of age who have normal immune system function should obtain the PPSV23 vaccine dose at least 1 year after the dose of PCV13 vaccine.  Pneumococcal polysaccharide (PPSV23) vaccine. When PCV13 is also indicated, PCV13 should be obtained first. All adults aged 65 years and older should be immunized. An adult younger than age 65 years who has certain medical conditions should be immunized. Any person who resides in a nursing home or long-term care facility should be immunized. An adult smoker should be immunized. People with an immunocompromised condition and certain other conditions should receive both PCV13 and PPSV23 vaccines. People with human immunodeficiency virus (HIV) infection should be immunized as soon as possible after diagnosis. Immunization during chemotherapy or radiation therapy should be avoided. Routine use of PPSV23 vaccine is not recommended for American Indians, [place] Natives, or people younger than 65 years unless there are medical conditions that require PPSV23 vaccine. When indicated, people who have unknown immunization and have no record of immunization should receive PPSV23 vaccine. One-time revaccination 5 years after the first dose of PPSV23 is recommended for people aged 19-64 years who have chronic kidney failure, nephrotic syndrome, asplenia, or immunocompromised conditions. People who received 1-2 doses of PPSV23 before age 65 years should receive another dose of PPSV23 vaccine at age 65 years or later if at least 5 years have passed since the previous dose. Doses of PPSV23 are not  needed for people immunized with PPSV23 at or after age 65 years.  Meningococcal vaccine. Adults with asplenia or persistent complement component deficiencies should receive 2 doses of quadrivalent meningococcal conjugate (MenACWY-D) vaccine. The doses should be obtained   at least 2 months apart. Microbiologists working with certain meningococcal bacteria, [place] recruits, people at risk during an outbreak, and people who travel to or live in countries with a high rate of meningitis should be immunized. A first-year college student up through age 34 years who is living in a residence hall should receive a dose if she did not receive a dose on or after her 16th birthday. Adults who have certain high-risk conditions should receive one or more doses of vaccine.  Hepatitis A vaccine. Adults who wish to be protected from this disease, have certain high-risk conditions, work with hepatitis A-infected animals, work in hepatitis A research labs, or travel to or work in countries with a high rate of hepatitis A should be immunized. Adults who were previously unvaccinated and who anticipate close contact with an international adoptee during the first 60 days after arrival in the [place] from a country with a high rate of hepatitis A should be immunized.  Hepatitis B vaccine. Adults who wish to be protected from this disease, have certain high-risk conditions, may be exposed to blood or other infectious body fluids, are household contacts or sex partners of hepatitis B positive people, are clients or workers in certain care facilities, or travel to or work in countries with a high rate of hepatitis B should be immunized.  Haemophilus influenzae type b (Hib) vaccine. A previously unvaccinated person with asplenia or sickle cell disease or having a scheduled splenectomy should receive 1 dose of Hib vaccine. Regardless of previous immunization, a recipient of a hematopoietic stem cell transplant should receive a  3-dose series 6-12 months after her successful transplant. Hib vaccine is not recommended for adults with HIV infection. Preventive Services / Frequency Ages 35 to 4 years  Blood pressure check.** / Every 3-5 years.  Lipid and cholesterol check.** / Every 5 years beginning at age 60.  Clinical breast exam.** / Every 3 years for women in their 71s and 10s.  BRCA-related cancer risk assessment.** / For women who have family members with a BRCA-related cancer (breast, ovarian, tubal, or peritoneal cancers).  Pap test.** / Every 2 years from ages 76 through 26. Every 3 years starting at age 61 through age 76 or 93 with a history of 3 consecutive normal Pap tests.  HPV screening.** / Every 3 years from ages 37 through ages 60 to 51 with a history of 3 consecutive normal Pap tests.  Hepatitis C blood test.** / For any individual with known risks for hepatitis C.  Skin self-exam. / Monthly.  Influenza vaccine. / Every year.  Tetanus, diphtheria, and acellular pertussis (Tdap, Td) vaccine.** / Consult your health care provider. Pregnant women should receive 1 dose of Tdap vaccine during each pregnancy. 1 dose of Td every 10 years.  Varicella vaccine.** / Consult your health care provider. Pregnant females who do not have evidence of immunity should receive the first dose after pregnancy.  HPV vaccine. / 3 doses over 6 months, if 93 and younger. The vaccine is not recommended for use in pregnant females. However, pregnancy testing is not needed before receiving a dose.  Measles, mumps, rubella (MMR) vaccine.** / You need at least 1 dose of MMR if you were born in 1957 or later. You may also need a 2nd dose. For females of childbearing age, rubella immunity should be determined. If there is no evidence of immunity, females who are not pregnant should be vaccinated. If there is no evidence of immunity, females who are  pregnant should delay immunization until after pregnancy.  Pneumococcal  13-valent conjugate (PCV13) vaccine.** / Consult your health care provider.  Pneumococcal polysaccharide (PPSV23) vaccine.** / 1 to 2 doses if you smoke cigarettes or if you have certain conditions.  Meningococcal vaccine.** / 1 dose if you are age 68 to 8 years and a Market researcher living in a residence hall, or have one of several medical conditions, you need to get vaccinated against meningococcal disease. You may also need additional booster doses.  Hepatitis A vaccine.** / Consult your health care provider.  Hepatitis B vaccine.** / Consult your health care provider.  Haemophilus influenzae type b (Hib) vaccine.** / Consult your health care provider. Ages 7 to 53 years  Blood pressure check.** / Every year.  Lipid and cholesterol check.** / Every 5 years beginning at age 25 years.  Lung cancer screening. / Every year if you are aged 11-80 years and have a 30-pack-year history of smoking and currently smoke or have quit within the past 15 years. Yearly screening is stopped once you have quit smoking for at least 15 years or develop a health problem that would prevent you from having lung cancer treatment.  Clinical breast exam.** / Every year after age 48 years.  BRCA-related cancer risk assessment.** / For women who have family members with a BRCA-related cancer (breast, ovarian, tubal, or peritoneal cancers).  Mammogram.** / Every year beginning at age 41 years and continuing for as long as you are in good health. Consult with your health care provider.  Pap test.** / Every 3 years starting at age 65 years through age 37 or 70 years with a history of 3 consecutive normal Pap tests.  HPV screening.** / Every 3 years from ages 72 years through ages 60 to 40 years with a history of 3 consecutive normal Pap tests.  Fecal occult blood test (FOBT) of stool. / Every year beginning at age 21 years and continuing until age 5 years. You may not need to do this test if you get  a colonoscopy every 10 years.  Flexible sigmoidoscopy or colonoscopy.** / Every 5 years for a flexible sigmoidoscopy or every 10 years for a colonoscopy beginning at age 35 years and continuing until age 48 years.  Hepatitis C blood test.** / For all people born from [birth year] through 1965 and any individual with known risks for hepatitis C.  Skin self-exam. / Monthly.  Influenza vaccine. / Every year.  Tetanus, diphtheria, and acellular pertussis (Tdap/Td) vaccine.** / Consult your health care provider. Pregnant women should receive 1 dose of Tdap vaccine during each pregnancy. 1 dose of Td every 10 years.  Varicella vaccine.** / Consult your health care provider. Pregnant females who do not have evidence of immunity should receive the first dose after pregnancy.  Zoster vaccine.** / 1 dose for adults aged 30 years or older.  Measles, mumps, rubella (MMR) vaccine.** / You need at least 1 dose of MMR if you were born in 1957 or later. You may also need a second dose. For females of childbearing age, rubella immunity should be determined. If there is no evidence of immunity, females who are not pregnant should be vaccinated. If there is no evidence of immunity, females who are pregnant should delay immunization until after pregnancy.  Pneumococcal 13-valent conjugate (PCV13) vaccine.** / Consult your health care provider.  Pneumococcal polysaccharide (PPSV23) vaccine.** / 1 to 2 doses if you smoke cigarettes or if you have certain conditions.  Meningococcal vaccine.** /  Consult your health care provider.  Hepatitis A vaccine.** / Consult your health care provider.  Hepatitis B vaccine.** / Consult your health care provider.  Haemophilus influenzae type b (Hib) vaccine.** / Consult your health care provider. Ages 64 years and over  Blood pressure check.** / Every year.  Lipid and cholesterol check.** / Every 5 years beginning at age 23 years.  Lung cancer screening. / Every year if you  are aged 16-80 years and have a 30-pack-year history of smoking and currently smoke or have quit within the past 15 years. Yearly screening is stopped once you have quit smoking for at least 15 years or develop a health problem that would prevent you from having lung cancer treatment.  Clinical breast exam.** / Every year after age 74 years.  BRCA-related cancer risk assessment.** / For women who have family members with a BRCA-related cancer (breast, ovarian, tubal, or peritoneal cancers).  Mammogram.** / Every year beginning at age 44 years and continuing for as long as you are in good health. Consult with your health care provider.  Pap test.** / Every 3 years starting at age 58 years through age 22 or 39 years with 3 consecutive normal Pap tests. Testing can be stopped between 65 and 70 years with 3 consecutive normal Pap tests and no abnormal Pap or HPV tests in the past 10 years.  HPV screening.** / Every 3 years from ages 64 years through ages 70 or 61 years with a history of 3 consecutive normal Pap tests. Testing can be stopped between 65 and 70 years with 3 consecutive normal Pap tests and no abnormal Pap or HPV tests in the past 10 years.  Fecal occult blood test (FOBT) of stool. / Every year beginning at age 40 years and continuing until age 27 years. You may not need to do this test if you get a colonoscopy every 10 years.  Flexible sigmoidoscopy or colonoscopy.** / Every 5 years for a flexible sigmoidoscopy or every 10 years for a colonoscopy beginning at age 7 years and continuing until age 32 years.  Hepatitis C blood test.** / For all people born from [birth year] through 1964 and any individual with known risks for hepatitis C.  Osteoporosis screening.** / A one-time screening for women ages 30 years and over and women at risk for fractures or osteoporosis.  Skin self-exam. / Monthly.  Influenza vaccine. / Every year.  Tetanus, diphtheria, and acellular pertussis (Tdap/Td)  vaccine.** / 1 dose of Td every 10 years.  Varicella vaccine.** / Consult your health care provider.  Zoster vaccine.** / 1 dose for adults aged 35 years or older.  Pneumococcal 13-valent conjugate (PCV13) vaccine.** / Consult your health care provider.  Pneumococcal polysaccharide (PPSV23) vaccine.** / 1 dose for all adults aged 46 years and older.  Meningococcal vaccine.** / Consult your health care provider.  Hepatitis A vaccine.** / Consult your health care provider.  Hepatitis B vaccine.** / Consult your health care provider.  Haemophilus influenzae type b (Hib) vaccine.** / Consult your health care provider. ** Family history and personal history of risk and conditions may change your health care provider's recommendations.   This information is not intended to replace advice given to you by your health care provider. Make sure you discuss any questions you have with your health care provider.   Document Released: 01/30/2002 Document Revised: 12/25/2014 Document Reviewed: 05/01/2011 Elsevier Interactive Patient Education [employer].

## 2016-09-14 NOTE — Assessment & Plan Note (Signed)
Pap today, no concerns on exam. [facility] [place] [facility] [place]

## 2016-09-14 NOTE — Progress Notes (Signed)
Patient ID: Alicia Cox, female   DOB: [birth year], 45 y.o.   MRN: [id number]   Subjective:    Patient ID: Alicia Cox, female    DOB: September 17, 1971, 45 y.o.   MRN: [id number]  Chief Complaint  Patient presents with  . Annual Exam    with pap    HPI Patient is in today for annual physical exam with pap and follow up on chronic medical concerns. No recent illness or hospitalizations. Continues to struggle with fatigue and stress. She works full time and has a family of 4 at home to care for. Denies polyuria or polydipsia. Denies CP/palp/SOB/HA/congestion/fevers/GI or GU c/o. Taking meds as prescribed  Past Medical History:  Diagnosis Date  . Acute bronchitis 11/03/2013  . Allergy    hay fever  . Arthritis   . Blood in stool   . Cervical cancer screening 09/14/2016   Menarche at 13 Regular and heavy flow no history of abnormal pap in past G2P2, s/p 2 svd No history of abnormal MGM No concerns today  gyn surgeries: tubal and endometrial ablation LMP 09/13/2016  . Chest pain 08/24/2013  . Chicken pox as a child  . Dermatitis 03/02/2016  . Frequent headaches   . GERD (gastroesophageal reflux disease)   . Heart murmur   . Hypothyroidism    Hashimoto's  . Knee pain, left 01/09/2014  . Migraine   . Obesity 11/15/2014  . Preventative health care 09/12/2015  . TOS (thoracic outlet syndrome) 08/25/2013  . UTI (urinary tract infection)   . Vitamin D deficiency     Past Surgical History:  Procedure Laterality Date  . APPENDECTOMY    . CESAREAN SECTION     twice  . CHOLECYSTECTOMY    . ENDOMETRIAL ABLATION    . TONSILLECTOMY    . TUBAL LIGATION  1999    Family History  Problem Relation Age of Onset  . Hyperlipidemia Mother   . Hypertension Mother   . Diabetes Father     type 2  . Hypertension Sister   . Diabetes Sister   . Asthma Daughter   . Heart disease Maternal Grandmother   . Heart attack Maternal Grandfather   . Diabetes Paternal Grandmother   . Diabetes Paternal  Grandfather     Social History   Social History  . Marital status: Married    Spouse name: N/A  . Number of children: N/A  . Years of education: N/A   Occupational History  . Not on file.   Social History Main Topics  . Smoking status: Never Smoker  . Smokeless tobacco: Never Used     Comment: NEVER USED TOBACCO  . Alcohol use No  . Drug use: No  . 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 Not on file   Social History Narrative  . No narrative on file    Outpatient Medications Prior to Visit  Medication Sig Dispense Refill  . ALPRAZolam (XANAX) 0.25 MG tablet Take 1 tablet (0.25 mg total) by mouth 2 (two) times daily as needed for anxiety. 20 tablet 0  . cholecalciferol (VITAMIN D) 1000 UNITS tablet Take 10,000 Units by mouth once a week.    . cyclobenzaprine (FLEXERIL) 10 MG tablet Take 1 tablet (10 mg total) by mouth at bedtime as needed for muscle spasms. 30 tablet 2  . escitalopram (LEXAPRO) 10  MG tablet TAKE 1 TABLET BY MOUTH DAILY 30 tablet 3  . HYDROcodone-acetaminophen (NORCO) 5-325 MG tablet Take 1 tablet by mouth 3 (three) times daily as needed for moderate pain. 60 tablet 0  . levothyroxine (SYNTHROID, LEVOTHROID) 100 MCG tablet 1 tab po daily except 1 1/2 tabs po on Thursdays and Sundays 105 tablet 3  . montelukast (SINGULAIR) 10 MG tablet Take 1 tablet (10 mg total) by mouth at bedtime. 30 tablet 2  . ranitidine (ZANTAC) 300 MG tablet Take 1 tablet (300 mg total) by mouth at bedtime. 30 tablet 6  . triamcinolone cream (KENALOG) 0.1 % Apply 1 application topically 2 (two) times daily as needed. 30 g 0   No facility-administered medications prior to visit.     No Known Allergies  Review of Systems  Constitutional: Positive for malaise/fatigue. Negative for chills and fever.  HENT: Negative for congestion and hearing loss.   Eyes: Negative for  discharge.  Respiratory: Negative for cough, sputum production and shortness of breath.   Cardiovascular: Negative for chest pain, palpitations and leg swelling.  Gastrointestinal: Negative for abdominal pain, blood in stool, constipation, diarrhea, heartburn, nausea and vomiting.  Genitourinary: Negative for dysuria, frequency, hematuria and urgency.  Musculoskeletal: Negative for back pain, falls and myalgias.  Skin: Negative for rash.  Neurological: Negative for dizziness, sensory change, loss of consciousness, weakness and headaches.  Endo/Heme/Allergies: Negative for environmental allergies. Does not bruise/bleed easily.  Psychiatric/Behavioral: Negative for depression and suicidal ideas. The patient is nervous/anxious. The patient does not have insomnia.        Objective:    Physical Exam  Constitutional: She is oriented to person, place, and time. She appears well-developed and well-nourished. No distress.  HENT:  Head: Normocephalic and atraumatic.  Eyes: Conjunctivae are normal.  Neck: Neck supple. No thyromegaly present.  Cardiovascular: Normal rate, regular rhythm and normal heart sounds.   No murmur heard. Pulmonary/Chest: Effort normal and breath sounds normal. No respiratory distress.  Abdominal: Soft. Bowel sounds are normal. She exhibits no distension and no mass. There is no tenderness.  Genitourinary: Uterus normal. No vaginal discharge found.  Genitourinary Comments: Breast exam unremarkable, no lesions, skin changes or discharge b/l  Musculoskeletal: She exhibits no edema.  Lymphadenopathy:    She has no cervical adenopathy.  Neurological: She is alert and oriented to person, place, and time.  Skin: Skin is warm and dry.  Psychiatric: She has a normal mood and affect. Her behavior is normal.    BP 112/82 (BP Location: Left Arm, Patient Position: Sitting, Cuff Size: Normal)   Pulse 88   Temp 98.2 F (36.8 C) (Oral)   Wt 260 lb 3.2 oz (118 kg)   SpO2 96%    BMI 44.66 kg/m  Wt Readings from Last 3 Encounters:  09/14/16 260 lb 3.2 oz (118 kg)  03/02/16 258 lb 4 oz (117.1 kg)  12/06/15 261 lb 3.2 oz (118.5 kg)     Lab Results  Component Value Date   WBC 11.6 (H) 03/02/2016   HGB 14.4 03/02/2016   HCT 42.9 03/02/2016   PLT 366.0 03/02/2016   GLUCOSE 104 (H) 03/02/2016   CHOL 161 03/02/2016   TRIG 256.0 (H) 03/02/2016   HDL 33.10 (L) 03/02/2016   LDLDIRECT 100.0 03/02/2016   LDLCALC 87 09/02/2015   ALT 15 03/02/2016   AST 12 03/02/2016   NA 139 03/02/2016   K 4.2 03/02/2016   CL 104 03/02/2016   CREATININE 0.73 03/02/2016   BUN 8  03/02/2016   CO2 28 03/02/2016   TSH 0.85 03/02/2016   HGBA1C 5.9 03/02/2016    Lab Results  Component Value Date   TSH 0.85 03/02/2016   Lab Results  Component Value Date   WBC 11.6 (H) 03/02/2016   HGB 14.4 03/02/2016   HCT 42.9 03/02/2016   MCV 96.0 03/02/2016   PLT 366.0 03/02/2016   Lab Results  Component Value Date   NA 139 03/02/2016   K 4.2 03/02/2016   CO2 28 03/02/2016   GLUCOSE 104 (H) 03/02/2016   BUN 8 03/02/2016   CREATININE 0.73 03/02/2016   BILITOT 0.3 03/02/2016   ALKPHOS 63 03/02/2016   AST 12 03/02/2016   ALT 15 03/02/2016   PROT 7.4 03/02/2016   ALBUMIN 4.0 03/02/2016   CALCIUM 9.2 03/02/2016   GFR 92.05 03/02/2016   Lab Results  Component Value Date   CHOL 161 03/02/2016   Lab Results  Component Value Date   HDL 33.10 (L) 03/02/2016   Lab Results  Component Value Date   LDLCALC 87 09/02/2015   Lab Results  Component Value Date   TRIG 256.0 (H) 03/02/2016   Lab Results  Component Value Date   CHOLHDL 5 03/02/2016   Lab Results  Component Value Date   HGBA1C 5.9 03/02/2016       Assessment & Plan:   Problem List Items Addressed This Visit    Allergy   Hypothyroidism - Primary    On Levothyroxine, continue to monitor      Hyperglycemia    hgba1c acceptable, minimize simple carbs. Increase exercise as tolerated.       Relevant Orders    Comprehensive metabolic panel   Hemoglobin A1c   Hyperlipidemia, mixed    Encouraged heart healthy diet, increase exercise, avoid trans fats, consider a krill oil cap daily      Relevant Orders   Lipid panel   Anxiety state    Continues to be excessively busy with work and home but is managing well most days. No changes      Obesity   Preventative health care    Patient encouraged to maintain heart healthy diet, regular exercise, adequate sleep. Consider daily probiotics. Take medications as prescribed. Labs ordered and reviewed. Given and reviewed copy of ACP documents from [employer] and encouraged to complete and return. MGM ordered. Flu shot given      Relevant Orders   TSH   CBC   Dermatitis    Given Nystatin cream to use bid for lesions below breasts. Report if no improvement      Cervical cancer screening    Pap today, no concerns on exam. Last pap roughly 4 years ago at free clinic in [place]      Relevant Orders   Cytology - PAP    Other Visit Diagnoses    Encounter for immunization       Relevant Orders   Flu Vaccine QUAD 36+ mos IM (Completed)   Breast cancer screening       Relevant Orders   MM Digital Screening      I am having Ms. Asher start on nystatin cream. I am also having her maintain her cholecalciferol, cyclobenzaprine, ALPRAZolam, levothyroxine, HYDROcodone-acetaminophen, montelukast, triamcinolone cream, ranitidine, and escitalopram.  Meds ordered this encounter  Medications  . nystatin cream (MYCOSTATIN)    Sig: Apply 1 application topically 2 (two) times daily.    Dispense:  30 g    Refill:  1  Penni Homans, MD

## 2016-09-14 NOTE — Assessment & Plan Note (Signed)
On Levothyroxine, continue to monitor 

## 2016-09-14 NOTE — Assessment & Plan Note (Signed)
Continues to be excessively busy with work and home but is managing well most days. No changes

## 2016-09-14 NOTE — Assessment & Plan Note (Signed)
hgba1c acceptable, minimize simple carbs. Increase exercise as tolerated.  

## 2016-09-14 NOTE — Assessment & Plan Note (Addendum)
Patient encouraged to maintain heart healthy diet, regular exercise, adequate sleep. Consider daily probiotics. Take medications as prescribed. Labs ordered and reviewed. Given and reviewed copy of ACP documents from [employer] and encouraged to complete and return. MGM ordered. Flu shot given

## 2016-09-14 NOTE — Assessment & Plan Note (Signed)
Encouraged heart healthy diet, increase exercise, avoid trans fats, consider a krill oil cap daily 

## 2016-09-14 NOTE — Assessment & Plan Note (Signed)
Given Nystatin cream to use bid for lesions below breasts. Report if no improvement

## 2016-09-15 LAB — CBC
HEMATOCRIT: 42.8 % (ref 36.0–46.0)
Hemoglobin: 14.8 g/dL (ref 12.0–15.0)
MCHC: 34.4 g/dL (ref 30.0–36.0)
MCV: 96.1 fl (ref 78.0–100.0)
Platelets: 387 10*3/uL (ref 150.0–400.0)
RBC: 4.46 Mil/uL (ref 3.87–5.11)
RDW: 12.5 % (ref 11.5–15.5)
WBC: 12 10*3/uL — AB (ref 4.0–10.5)

## 2016-09-15 LAB — COMPREHENSIVE METABOLIC PANEL
ALBUMIN: 3.9 g/dL (ref 3.5–5.2)
ALK PHOS: 67 U/L (ref 39–117)
ALT: 20 U/L (ref 0–35)
AST: 19 U/L (ref 0–37)
BUN: 9 mg/dL (ref 6–23)
CALCIUM: 9.1 mg/dL (ref 8.4–10.5)
CHLORIDE: 103 meq/L (ref 96–112)
CO2: 31 mEq/L (ref 19–32)
Creatinine, Ser: 0.76 mg/dL (ref 0.40–1.20)
GFR: 87.65 mL/min (ref >60.00)
Glucose, Bld: 82 mg/dL (ref 70–99)
POTASSIUM: 4.2 meq/L (ref 3.5–5.1)
SODIUM: 140 meq/L (ref 135–145)
Total Bilirubin: 0.2 mg/dL (ref 0.2–1.2)
Total Protein: 7.8 g/dL (ref 6.0–8.3)

## 2016-09-15 LAB — LIPID PANEL
CHOLESTEROL: 176 mg/dL (ref 0–200)
HDL: 35 mg/dL — ABNORMAL LOW (ref >39.00)
NonHDL: 141.34
Total CHOL/HDL Ratio: 5
Triglycerides: 285 mg/dL — ABNORMAL HIGH (ref 0.0–149.0)
VLDL: 57 mg/dL — AB (ref 0.0–40.0)

## 2016-09-15 LAB — LDL CHOLESTEROL, DIRECT: LDL DIRECT: 117 mg/dL

## 2016-09-15 LAB — TSH: TSH: 1.5 u[IU]/mL (ref 0.35–4.50)

## 2016-09-15 LAB — HEMOGLOBIN A1C: HEMOGLOBIN A1C: 5.9 % (ref 4.6–6.5)

## 2016-09-18 ENCOUNTER — Other Ambulatory Visit: Payer: Self-pay | Admitting: Family Medicine

## 2016-09-18 DIAGNOSIS — D72829 Elevated white blood cell count, unspecified: Secondary | ICD-10-CM

## 2016-09-19 LAB — CYTOLOGY - PAP

## 2016-09-20 LAB — CERVICOVAGINAL ANCILLARY ONLY
Bacterial vaginitis: NEGATIVE
Candida vaginitis: NEGATIVE
Herpes: NEGATIVE

## 2016-10-02 ENCOUNTER — Ambulatory Visit (HOSPITAL_BASED_OUTPATIENT_CLINIC_OR_DEPARTMENT_OTHER): Payer: BLUE CROSS/BLUE SHIELD

## 2016-10-02 ENCOUNTER — Ambulatory Visit (INDEPENDENT_AMBULATORY_CARE_PROVIDER_SITE_OTHER): Payer: BLUE CROSS/BLUE SHIELD | Admitting: Medical

## 2016-10-02 VITALS — HR 81 | Temp 98.8°F | Ht 64.0 in | Wt 258.6 lb

## 2016-10-02 DIAGNOSIS — R062 Wheezing: Secondary | ICD-10-CM

## 2016-10-02 DIAGNOSIS — J309 Allergic rhinitis, unspecified: Secondary | ICD-10-CM | POA: Diagnosis not present

## 2016-10-02 DIAGNOSIS — J209 Acute bronchitis, unspecified: Secondary | ICD-10-CM

## 2016-10-02 DIAGNOSIS — R05 Cough: Secondary | ICD-10-CM | POA: Diagnosis not present

## 2016-10-02 DIAGNOSIS — R059 Cough, unspecified: Secondary | ICD-10-CM

## 2016-10-02 MED ORDER — DOXYCYCLINE HYCLATE 100 MG PO TABS: 100.0000 mg | ORAL_TABLET | Freq: Two times a day (BID) | ORAL | 0 refills | Status: DC

## 2016-10-02 MED ORDER — HYDROCODONE-HOMATROPINE 5-1.5 MG/5ML PO SYRP: 5.0000 mL | ORAL_SOLUTION | Freq: Three times a day (TID) | ORAL | 0 refills | Status: DC | PRN

## 2016-10-02 MED ORDER — ALBUTEROL SULFATE HFA 108 (90 BASE) MCG/ACT IN AERS: 2.0000 | INHALATION_SPRAY | Freq: Four times a day (QID) | RESPIRATORY_TRACT | 0 refills | Status: DC | PRN

## 2016-10-02 MED ORDER — PREDNISONE 10 MG PO TABS: ORAL_TABLET | ORAL | 0 refills | Status: DC

## 2016-10-02 MED FILL — DOXYCYCLINE 100 MG TABLET: 100 | 10 days supply | Qty: 20 | Fill #0

## 2016-10-02 MED FILL — HYDROCODONE-HOMATROPINE SYR: 5-1.5 | 8 days supply | Qty: 120 | Fill #0

## 2016-10-02 MED FILL — VENTOLIN HFA 90 MCG INHALER: 108 (90 BAS | 30 days supply | Qty: 18 | Fill #0

## 2016-10-02 MED FILL — predniSONE 10 MG TABS: 10 | 6 days supply | Qty: 21 | Fill #0

## 2016-10-02 NOTE — Progress Notes (Signed)
Subjective:    Patient ID: Alicia Cox, female    DOB: August 28, 1971, 45 y.o.   MRN: [id number]  HPI  Pt in with some cough recently.   Pt states with cough for about 2 weeks. Cough worse during the day. Over past week pt feels like she is wheezing. Feels like can't take a full deep breath. When coughs bringing  up mucous. Some fevers past weekend but not this weekend.    Non smoker. Pt has used inhaler in past when got sick. Pt states maybe one time a year will get bronchitis.  LMP- history of ablation in past.  Has used advil cold and sinus.  Pt has also used her zyrtec, flonase and singulair.(uses these meds year round)    Review of Systems  Constitutional: Negative for chills, fatigue and fever.       A week ago but not recently.  HENT: Positive for congestion, postnasal drip and sneezing. Negative for sinus pressure and trouble swallowing.   Respiratory: Positive for cough and wheezing. Negative for chest tightness and shortness of breath.   Cardiovascular: Negative for chest pain and palpitations.  Gastrointestinal: Negative for abdominal pain and anal bleeding.  Musculoskeletal: Negative for back pain and myalgias.  Skin: Negative for rash.  Hematological: Negative for adenopathy. Does not bruise/bleed easily.    Past Medical History:  Diagnosis Date  . Acute bronchitis 11/03/2013  . Allergy    hay fever  . Arthritis   . Blood in stool   . Cervical cancer screening 09/14/2016   Menarche at 13 Regular and heavy flow no history of abnormal pap in past G2P2, s/p 2 svd No history of abnormal MGM No concerns today  gyn surgeries: tubal and endometrial ablation LMP 09/13/2016  . Chest pain 08/24/2013  . Chicken pox as a child  . Dermatitis 03/02/2016  . Frequent headaches   . GERD (gastroesophageal reflux disease)   . Heart murmur   . Hypothyroidism    Hashimoto's  . Knee pain, left 01/09/2014  . Migraine   . Obesity 11/15/2014  . Preventative health care 09/12/2015   . TOS (thoracic outlet syndrome) 08/25/2013  . UTI (urinary tract infection)   . Vitamin D deficiency      Social History   Social History  . Marital status: Married    Spouse name: N/A  . Number of children: N/A  . Years of education: N/A   Occupational History  . Not on file.   Social History Main Topics  . Smoking status: Never Smoker  . Smokeless tobacco: Never Used     Comment: NEVER USED TOBACCO  . Alcohol use No  . Drug use: No  . 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 Not on file   Social History Narrative  . No narrative on file    Past Surgical History:  Procedure Laterality Date  . APPENDECTOMY    . CESAREAN SECTION     twice  . CHOLECYSTECTOMY    . ENDOMETRIAL ABLATION    . TONSILLECTOMY    . TUBAL LIGATION  1999    Family History  Problem Relation Age of Onset  . Hyperlipidemia Mother   . Hypertension Mother   . Diabetes Father     type 2  . Hypertension Sister   . Diabetes Sister   . Asthma Daughter   .  Heart disease Maternal Grandmother   . Heart attack Maternal Grandfather   . Diabetes Paternal Grandmother   . Diabetes Paternal Grandfather     No Known Allergies  Current Outpatient Prescriptions on File Prior to Visit  Medication Sig Dispense Refill  . ALPRAZolam (XANAX) 0.25 MG tablet Take 1 tablet (0.25 mg total) by mouth 2 (two) times daily as needed for anxiety. 20 tablet 0  . cholecalciferol (VITAMIN D) 1000 UNITS tablet Take 10,000 Units by mouth once a week.    . cyclobenzaprine (FLEXERIL) 10 MG tablet Take 1 tablet (10 mg total) by mouth at bedtime as needed for muscle spasms. 30 tablet 2  . escitalopram (LEXAPRO) 10 MG tablet TAKE 1 TABLET BY MOUTH DAILY 30 tablet 3  . HYDROcodone-acetaminophen (NORCO) 5-325 MG tablet Take 1 tablet by mouth 3 (three) times daily as needed for moderate pain. 60 tablet 0  .  levothyroxine (SYNTHROID, LEVOTHROID) 100 MCG tablet 1 tab po daily except 1 1/2 tabs po on Thursdays and Sundays 105 tablet 3  . montelukast (SINGULAIR) 10 MG tablet Take 1 tablet (10 mg total) by mouth at bedtime. 30 tablet 2  . nystatin cream (MYCOSTATIN) Apply 1 application topically 2 (two) times daily. 30 g 1  . ranitidine (ZANTAC) 300 MG tablet Take 1 tablet (300 mg total) by mouth at bedtime. 30 tablet 6  . triamcinolone cream (KENALOG) 0.1 % Apply 1 application topically 2 (two) times daily as needed. 30 g 0   No current facility-administered medications on file prior to visit.     Pulse 81   Temp 98.8 F (37.1 C) (Oral)   Ht 5\' 4"  (1.626 m)   Wt 258 lb 9.6 oz (117.3 kg)   SpO2 98%   BMI 44.39 kg/m       Objective:   Physical Exam   General  Mental Status - Alert. General Appearance - Well groomed. Not in acute distress.  Skin Rashes- No Rashes.  HEENT Head- Normal. Ear Auditory Canal - Left- Normal. Right - Normal.Tympanic Membrane- Left- faint dull left tm. Right- Normal. Eye Sclera/Conjunctiva- Left- Normal. Right- Normal. Nose & Sinuses Nasal Mucosa- Left-  Boggy and Congested. Right-  Boggy and  Congested.Bilateral maxillary and frontal sinus pressure. Mouth & Throat Lips: Upper Lip- Normal: no dryness, cracking, pallor, cyanosis, or vesicular eruption. Lower Lip-Normal: no dryness, cracking, pallor, cyanosis or vesicular eruption. Buccal Mucosa- Bilateral- No Aphthous ulcers. Oropharynx- No Discharge or Erythema. Tonsils: Characteristics- Bilateral- No Erythema or Congestion. Size/Enlargement- Bilateral- No enlargement. Discharge- bilateral-None.  Neck Neck- Supple. No Masses.   Chest and Lung Exam Auscultation: Breath Sounds:-even and unlabored. Mild shallow.  Cardiovascular Auscultation:Rythm- Regular, rate and rhythm. Murmurs & Other Heart Sounds:Ausculatation of the heart reveal- No Murmurs.  Lymphatic Head & Neck General Head & Neck  Lymphatics: Bilateral: Description- No Localized lymphadenopathy.      Assessment & Plan:  You appear to have bronchitis with possible sinus infection. Rest hydrate and tylenol for fever. I am prescribing cough medicine ,  and  doxycycline antibiotic. For your nasal congestion use your flonase nasal steroid.   For wheezing will rx albuterol. But if sob or wheezing persisting then start prednisone.  You should gradually get better. If not then notify us and would recommend a chest xray.  Follow up in 7-10 days or as needed  Desarae Placide, Ramon Dredge, [employer]

## 2016-10-02 NOTE — Progress Notes (Signed)
Pre visit review using our clinic tool,if applicable. No additional management support is needed unless otherwise documented below in the visit note.  

## 2016-10-02 NOTE — Patient Instructions (Addendum)
You appear to have bronchitis with possible sinus infection. Rest hydrate and tylenol for fever. I am prescribing cough medicine hycodan(rx advisement), and  doxycycline antibiotic. For your nasal congestion use your flonase nasal steroid.   For wheezing will rx albuterol. But if sob or wheezing perstiing  then start prednisone.  You should gradually get better. If not then notify us and would recommend a chest xray.  Follow up in 7-10 days or as needed

## 2016-10-11 MED FILL — ESCITALOPRAM 10 MG TABLET: 10 | 30 days supply | Qty: 30 | Fill #2

## 2016-10-24 ENCOUNTER — Ambulatory Visit (HOSPITAL_BASED_OUTPATIENT_CLINIC_OR_DEPARTMENT_OTHER)
Admission: RE | Admit: 2016-10-24 | Discharge: 2016-10-24 | Disposition: A | Discharge status: Home / Self Care | Payer: BLUE CROSS/BLUE SHIELD | Source: Ambulatory Visit | Attending: Family Medicine | Admitting: Family Medicine

## 2016-10-24 ENCOUNTER — Encounter (HOSPITAL_BASED_OUTPATIENT_CLINIC_OR_DEPARTMENT_OTHER): Payer: Self-pay

## 2016-10-24 DIAGNOSIS — Z1239 Encounter for other screening for malignant neoplasm of breast: Secondary | ICD-10-CM

## 2016-10-24 DIAGNOSIS — Z1231 Encounter for screening mammogram for malignant neoplasm of breast: Secondary | ICD-10-CM | POA: Insufficient documentation

## 2016-10-24 NOTE — Progress Notes (Signed)
CC: left knee  Follow up.   HPI: Patient is a pleasant 45 year old female coming in followup of left knee pain Left knee pain.  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  Patient has not been seen in one year. Patient was doing very well with conservative therapy and after patient was given an injection in her knee at that time. Patient states pain is starting to come back. Starting have some increasing pain that is stopping her from activities. Patient is traveling out of the state and will be doing a lot of walking he was to make sure that knee does not give out on her. Patient states that there has been some mild swelling. Rates the severity of pain a 7 out of 10. Seems to be worsening.  Past Medical History:  Diagnosis Date  . Acute bronchitis 11/03/2013  . Allergy    hay fever  . Arthritis   . Blood in stool   . Cervical cancer screening 09/14/2016   Menarche at 13 Regular and heavy flow no history of abnormal pap in past G2P2, s/p 2 svd No history of abnormal MGM No concerns today  gyn surgeries: tubal and endometrial ablation LMP 09/13/2016  . Chest pain 08/24/2013  . Chicken pox as a child  . Dermatitis 03/02/2016  . Frequent headaches   . GERD (gastroesophageal reflux disease)   . Heart murmur   . Hypothyroidism    Hashimoto's  . Knee pain, left 01/09/2014  . Migraine   . Obesity 11/15/2014  . Preventative health care 09/12/2015  . TOS (thoracic outlet syndrome) 08/25/2013  . UTI (urinary tract infection)   . Vitamin D deficiency    Past Surgical History:  Procedure Laterality Date  . APPENDECTOMY    . CESAREAN SECTION     twice  . CHOLECYSTECTOMY    . ENDOMETRIAL ABLATION    . TONSILLECTOMY    . TUBAL LIGATION  1999   Social History  Substance Use Topics  . Smoking status: Never Smoker  . Smokeless tobacco: Never Used     Comment: NEVER USED TOBACCO  . Alcohol use No   No Known Allergies Family History  Problem Relation Age of  Onset  . Hyperlipidemia Mother   . Hypertension Mother   . Diabetes Father     type 2  . Hypertension Sister   . Diabetes Sister   . Asthma Daughter   . Heart disease Maternal Grandmother   . Heart attack Maternal Grandfather   . Diabetes Paternal Grandmother   . Diabetes Paternal Grandfather     Past medical, surgical, family and social history reviewed. Medications reviewed all in the electronic medical record.   Review of Systems: No headache, visual changes, nausea, vomiting, diarrhea, constipation, dizziness, abdominal pain, skin rash, fevers, chills, night sweats, weight loss, swollen lymph nodes, body aches, joint swelling, muscle aches, chest pain, shortness of breath, mood changes.   Objective:    Blood pressure 132/80, pulse 83, height 5\' 4"  (1.626 m), weight 262 lb (118.8 kg), SpO2 94 %.   Systems examined below as of 10/25/16 General: NAD A&O x3 mood, affect normal Obese HEENT: Pupils equal, extraocular movements intact no nystagmus Respiratory: not short of breath at rest or with speaking Cardiovascular: No lower extremity edema, non tender Skin: Warm dry intact with no signs of infection or rash on extremities or on axial skeleton. Abdomen: Soft nontender, no masses Neuro: Cranial nerves  intact, neurovascularly intact in all  extremities with 2+ DTRs and 2+ pulses. Lymph: No lymphadenopathy appreciated today  Gait normal with good balance and coordination.  MSK: Non tender with full range of motion and good stability and symmetric strength and tone of shoulders, elbows, wrist,  knee hips and ankles bilaterally.    Knee: Left Mild valgus deformity noted Increasing tenderness over the medial joint line. ROM full in flexion and extension and lower leg rotation. Ligaments with solid consistent endpoints including ACL, PCL, LCL, MCL. Negative Mcmurray's, Apley's, and Thessalonian tests. painful patellar compression. Patellar glide with moderate crepitus. Patellar  and quadriceps tendons unremarkable. Hamstring and quadriceps strength is normal.  Contralateral knee has some mild valgus deformity but very minimal  After informed written and verbal consent, patient was seated on exam table. Left knee was prepped with alcohol swab and utilizing anterolateral approach, patient's left knee space was injected with 4:1  marcaine 0.5%: Kenalog 40mg /dL. Patient tolerated the procedure well without immediate complications.   Impression and Recommendations:     This case required medical decision making of moderate complexity.

## 2016-10-25 ENCOUNTER — Ambulatory Visit (INDEPENDENT_AMBULATORY_CARE_PROVIDER_SITE_OTHER): Payer: BLUE CROSS/BLUE SHIELD | Admitting: Family Medicine

## 2016-10-25 ENCOUNTER — Encounter: Payer: Self-pay | Admitting: Family Medicine

## 2016-10-25 DIAGNOSIS — E669 Obesity, unspecified: Secondary | ICD-10-CM | POA: Diagnosis not present

## 2016-10-25 DIAGNOSIS — M179 Osteoarthritis of knee, unspecified: Secondary | ICD-10-CM | POA: Insufficient documentation

## 2016-10-25 DIAGNOSIS — M1712 Unilateral primary osteoarthritis, left knee: Secondary | ICD-10-CM | POA: Diagnosis not present

## 2016-10-25 DIAGNOSIS — IMO0001 Reserved for inherently not codable concepts without codable children: Secondary | ICD-10-CM

## 2016-10-25 MED ORDER — DICLOFENAC SODIUM 2 % TD SOLN: 2.0000 "application " | Freq: Two times a day (BID) | TRANSDERMAL | 3 refills | Status: DC

## 2016-10-25 NOTE — Patient Instructions (Addendum)
Good to see you  Ice is your friend  pennsaid pinkie amount topically 2 times daily as needed.  Sent to a special pharmacy  [place] in [place] See me again when you need me.

## 2016-10-25 NOTE — Assessment & Plan Note (Signed)
Patient encouraged to do the home exercises, topical anti-inflammatories given for worsening pain. Patient will do more of an icing protocol. Discuss proper shoes and the importance of weight loss. Follow-up with me again in 4-6 weeks. Could be a candidate for viscous supplementation.

## 2016-10-25 NOTE — Assessment & Plan Note (Signed)
Encouraged weight loss 

## 2016-11-15 MED FILL — ESCITALOPRAM 10 MG TABLET: 10 | 30 days supply | Qty: 30 | Fill #3

## 2016-12-04 ENCOUNTER — Other Ambulatory Visit: Payer: Self-pay | Admitting: Family Medicine

## 2016-12-04 ENCOUNTER — Other Ambulatory Visit (INDEPENDENT_AMBULATORY_CARE_PROVIDER_SITE_OTHER): Payer: BLUE CROSS/BLUE SHIELD

## 2016-12-04 DIAGNOSIS — D72829 Elevated white blood cell count, unspecified: Secondary | ICD-10-CM

## 2016-12-04 LAB — CBC WITH DIFFERENTIAL/PLATELET
BASOS PCT: 0.6 % (ref 0.0–3.0)
Basophils Absolute: 0.1 10*3/uL (ref 0.0–0.1)
EOS PCT: 2 % (ref 0.0–5.0)
Eosinophils Absolute: 0.2 10*3/uL (ref 0.0–0.7)
HCT: 44.2 % (ref 36.0–46.0)
Hemoglobin: 15.1 g/dL — ABNORMAL HIGH (ref 12.0–15.0)
LYMPHS ABS: 2.2 10*3/uL (ref 0.7–4.0)
Lymphocytes Relative: 19.7 % (ref 12.0–46.0)
MCHC: 34.3 g/dL (ref 30.0–36.0)
MCV: 96.8 fl (ref 78.0–100.0)
MONO ABS: 0.6 10*3/uL (ref 0.1–1.0)
Monocytes Relative: 5.5 % (ref 3.0–12.0)
NEUTROS ABS: 8.2 10*3/uL — AB (ref 1.4–7.7)
NEUTROS PCT: 72.2 % (ref 43.0–77.0)
PLATELETS: 376 10*3/uL (ref 150.0–400.0)
RBC: 4.57 Mil/uL (ref 3.87–5.11)
RDW: 13 % (ref 11.5–15.5)
WBC: 11.3 10*3/uL — ABNORMAL HIGH (ref 4.0–10.5)

## 2016-12-13 MED FILL — ESCITALOPRAM 10 MG TABLET: 10 | 30 days supply | Qty: 30 | Fill #0

## 2017-01-06 ENCOUNTER — Other Ambulatory Visit: Payer: Self-pay | Admitting: Family Medicine

## 2017-01-06 DIAGNOSIS — E039 Hypothyroidism, unspecified: Secondary | ICD-10-CM

## 2017-01-16 NOTE — Progress Notes (Signed)
[name] [address] [place], [place] [address] Phone: [phone] Subjective:    I'm seeing this patient by the request  of:  [name], MD   CC: left shoulder pain  [id number]  Alicia Cox is a 46 y.o. female coming in with complaint of left shoulder pain   Patient states that it seems to be getting worse over the course last 1-2 months. Does not rib number any true injury. Starting have some mild decrease in range of motion. Patient states that it is waking her up at night. Mild radiation going down the arm. States that it is also having some associated neck pain with it. Rates the severity pain is 8 out of 10. Has tried over-the-counter medication with mild improvement. Past pedicle history significant for a subacromial bursitis on the contralateral side long time ago.     Past Medical History:  Diagnosis Date  . Acute bronchitis 11/03/2013  . Allergy    hay fever  . Arthritis   . Blood in stool   . Cervical cancer screening 09/14/2016   Menarche at 13 Regular and heavy flow no history of abnormal pap in past G2P2, s/p 2 svd No history of abnormal MGM No concerns today  gyn surgeries: tubal and endometrial ablation LMP 09/13/2016  . Chest pain 08/24/2013  . Chicken pox as a child  . Dermatitis 03/02/2016  . Frequent headaches   . GERD (gastroesophageal reflux disease)   . Heart murmur   . Hypothyroidism    Hashimoto's  . Knee pain, left 01/09/2014  . Migraine   . Obesity 11/15/2014  . Preventative health care 09/12/2015  . TOS (thoracic outlet syndrome) 08/25/2013  . UTI (urinary tract infection)   . Vitamin D deficiency    Past Surgical History:  Procedure Laterality Date  . APPENDECTOMY    . CESAREAN SECTION     twice  . CHOLECYSTECTOMY    . ENDOMETRIAL ABLATION    . TONSILLECTOMY    . TUBAL LIGATION  1999   Social History   Social History  . Marital status: Married    Spouse name: N/A  . Number of children: N/A  .  Years of education: N/A   Social History Main Topics  . Smoking status: Never Smoker  . Smokeless tobacco: Never Used     Comment: NEVER USED TOBACCO  . Alcohol use No  . Drug use: No  . 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 None   Social History Narrative  . None   No Known Allergies Family History  Problem Relation Age of Onset  . Hyperlipidemia Mother   . Hypertension Mother   . Diabetes Father     type 2  . Hypertension Sister   . Diabetes Sister   . Asthma Daughter   . Heart disease Maternal Grandmother   . Heart attack Maternal Grandfather   . Diabetes Paternal Grandmother   . Diabetes Paternal Grandfather     Past medical history, social, surgical and family history all reviewed in electronic medical record.  No pertanent information unless stated regarding to the chief complaint.   Review of Systems: No headache, visual changes, nausea, vomiting, diarrhea, constipation, dizziness, abdominal pain, skin rash, fevers, chills, night sweats, weight loss, swollen lymph nodes, chest pain, shortness of breath, mood changes.  Objective  Blood pressure 132/80, pulse 79, height 5\' 4"  (1.626 m), weight 263 lb (119.3 kg), SpO2 98 %. Systems examined below as of 01/17/17   General: No apparent distress alert and oriented x3 mood and affect normal, dressed appropriately.  HEENT: Pupils equal, extraocular movements intact  Respiratory: Patient's speak in full sentences and does not appear short of breath  Cardiovascular: No lower extremity edema, non tender, no erythema  Skin: Warm dry intact with no signs of infection or rash on extremities or on axial skeleton.  Abdomen: Soft nontender  Neuro: Cranial nerves II through XII are intact, neurovascularly intact in all extremities with 2+ DTRs and 2+ pulses.  Lymph: No lymphadenopathy of posterior or anterior  cervical chain or axillae bilaterally.  Gait normal with good balance and coordination.  MSK:  Non tender with full range of motion and good stability and symmetric strength and tone of  elbows, wrist, hip, knee and ankles bilaterally.   Neck: Inspection unremarkable. No palpable stepoffs. Negative Spurling's maneuver. Full neck range of motion Grip strength and sensation normal in bilateral hands Strength good C4 to T1 distribution No sensory change to C4 to T1 Negative Hoffman sign bilaterally Reflexes normal  Shoulder: left Inspection reveals no abnormalities, atrophy or asymmetry. Palpation is normal with no tenderness over AC joint or bicipital groove. ROM is full in all planes passively. Rotator cuff strength normal throughout. signs of impingement with positive Neer and Hawkin's tests, but negative empty can sign. Speeds and Yergason's tests normal. No labral pathology noted with negative Obrien's, negative clunk and good stability. Normal scapular function observed. No painful arc and no drop arm sign. No apprehension sign  MSK US performed of: left This study was ordered, performed, and interpreted by Terrilee Files D.O.  Shoulder:   Supraspinatus:  Appears normal on long and transverse views, Bursal bulge seen with shoulder abduction on impingement view. Thickening of the superior capsule Infraspinatus:  Appears normal on long and transverse views. Significant increase in Doppler flow thickening of the posterior capsule Subscapularis:  Appears normal on long and transverse views. Positive bursa Teres Minor:  Appears normal on long and transverse views. AC joint:  Capsule undistended, no geyser sign. Glenohumeral Joint:  Appears normal without effusion. Glenoid Labrum:  Intact without visualized tears. Biceps Tendon:  Appears normal on long and transverse views, no fraying of tendon, tendon located in intertubercular groove, no subluxation with shoulder internal or external  rotation.  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  Procedure: Real-time Ultrasound Guided Injection of left glenohumeral joint Device: GE Logiq E  Ultrasound guided injection is preferred based studies that show increased duration, increased effect, greater accuracy, decreased procedural pain, increased response rate with ultrasound guided versus blind injection.  Verbal informed consent obtained.  Time-out conducted.  Noted no overlying erythema, induration, or other signs of local infection.  Skin prepped in a sterile fashion.  Local anesthesia: Topical Ethyl chloride.  With sterile technique and under real time ultrasound guidance:  Joint visualized.  23g 1  inch needle inserted posterior approach. Pictures taken for needle placement. Patient did have injection of 2 cc of 1% lidocaine, 2 cc of 0.5% Marcaine, and 1.0 cc of Kenalog 40 mg/dL. Completed without difficulty  Pain immediately resolved suggesting accurate placement of the medication.  Advised to call if fevers/chills, erythema, induration, drainage, or persistent bleeding.  Images permanently stored and available for review in the ultrasound unit.  Impression: Technically successful ultrasound guided  injection.  Procedure note 97110; 15 minutes spent for Therapeutic exercises as stated in above notes.  This included exercises focusing on stretching, strengthening, with significant focus on eccentric aspects. Shoulder Exercises that included:  Basic scapular stabilization to include adduction and depression of scapula Scaption, focusing on proper movement and good control Internal and External rotation utilizing a theraband, with elbow tucked at side entire time Rows with theraband    Proper technique shown and discussed handout in great detail with ATC.  All questions were discussed and answered.     Impression and Recommendations:     This case required medical decision making of  moderate complexity.      Note: This dictation was prepared with Dragon dictation along with smaller phrase technology. Any transcriptional errors that result from this process are unintentional.

## 2017-01-17 ENCOUNTER — Ambulatory Visit: Payer: Self-pay

## 2017-01-17 ENCOUNTER — Ambulatory Visit (INDEPENDENT_AMBULATORY_CARE_PROVIDER_SITE_OTHER)
Admission: RE | Admit: 2017-01-17 | Discharge: 2017-01-17 | Disposition: A | Discharge status: Home / Self Care | Payer: BLUE CROSS/BLUE SHIELD | Source: Ambulatory Visit | Attending: Family Medicine | Admitting: Family Medicine

## 2017-01-17 ENCOUNTER — Encounter: Payer: Self-pay | Admitting: Family Medicine

## 2017-01-17 ENCOUNTER — Ambulatory Visit (INDEPENDENT_AMBULATORY_CARE_PROVIDER_SITE_OTHER): Payer: BLUE CROSS/BLUE SHIELD | Admitting: Family Medicine

## 2017-01-17 VITALS — BP 132/80 | HR 79 | Ht 64.0 in | Wt 263.0 lb

## 2017-01-17 DIAGNOSIS — M7500 Adhesive capsulitis of unspecified shoulder: Secondary | ICD-10-CM | POA: Insufficient documentation

## 2017-01-17 DIAGNOSIS — M25512 Pain in left shoulder: Secondary | ICD-10-CM

## 2017-01-17 DIAGNOSIS — M7502 Adhesive capsulitis of left shoulder: Secondary | ICD-10-CM

## 2017-01-17 MED ORDER — VITAMIN D (ERGOCALCIFEROL) 1.25 MG (50000 UNIT) PO CAPS: 50000.0000 [IU] | ORAL_CAPSULE | ORAL | 0 refills | Status: DC

## 2017-01-17 MED ORDER — MELOXICAM 15 MG PO TABS: 15.0000 mg | ORAL_TABLET | Freq: Every day | ORAL | 0 refills | Status: DC

## 2017-01-17 MED FILL — MELOXICAM 15 MG TABLET: 15 | 30 days supply | Qty: 30 | Fill #0

## 2017-01-17 MED FILL — ESCITALOPRAM 10 MG TABLET: 10 | 30 days supply | Qty: 30 | Fill #1

## 2017-01-17 MED FILL — VIT D2 1.25 MG (50,000 UNIT: 1.25 MG | 90 days supply | Qty: 12 | Fill #0

## 2017-01-17 NOTE — Patient Instructions (Signed)
Good to see you  Ice 20 minutes 2 times daily. Usually after activity and before bed. Exercises 3 times a week.  pennsaid pinkie amount topically 2 times daily as needed.  Meloxicam daily for 10 days then as needed, watch your stomach Once weekly vitamin D for 12 weeks.  Xray downstairs See me again in 3 weeks.

## 2017-01-17 NOTE — Assessment & Plan Note (Signed)
Injection given today. We discussed icing regimen and home exercises. Patient try topical anti-inflammatories. Because the patient's low vitamin D has been associated with frozen shoulder previously with her once weekly vitamin D. Meloxicam for breakthrough pain. Follow-up again in 3-4 weeks.

## 2017-01-24 ENCOUNTER — Ambulatory Visit: Payer: BLUE CROSS/BLUE SHIELD | Admitting: Family Medicine

## 2017-02-07 ENCOUNTER — Ambulatory Visit: Payer: BLUE CROSS/BLUE SHIELD | Admitting: Family Medicine

## 2017-02-07 ENCOUNTER — Encounter: Payer: Self-pay | Admitting: Family Medicine

## 2017-02-07 ENCOUNTER — Ambulatory Visit (INDEPENDENT_AMBULATORY_CARE_PROVIDER_SITE_OTHER): Payer: BLUE CROSS/BLUE SHIELD | Admitting: Family Medicine

## 2017-02-07 DIAGNOSIS — M7502 Adhesive capsulitis of left shoulder: Secondary | ICD-10-CM

## 2017-02-07 MED ORDER — GABAPENTIN 100 MG PO CAPS: 200.0000 mg | ORAL_CAPSULE | Freq: Every day | ORAL | 3 refills | Status: DC

## 2017-02-07 MED FILL — GABAPENTIN 100 MG CAP: 100 | 30 days supply | Qty: 60 | Fill #0

## 2017-02-07 NOTE — Patient Instructions (Addendum)
Good to see you  Gabapentin 200mg  at night Ice is your friend Keep pushing it overall.  On wall with heels, butt shoulder and head touching for a goal of 5 minutes daily  See em again in 6-8 weeks to make sure you are doing well.

## 2017-02-07 NOTE — Assessment & Plan Note (Signed)
Doing better with conservative therapy. Discussed with patient at great length. We discussed icing regimen and home exercises. We discussed which activities doing which ones to avoid. Encourage patient to continue conservative activity. Follow-up with me again in 4 weeks. Worsening symptoms consider repeating injection as well as potentially formal physical therapy.

## 2017-02-07 NOTE — Progress Notes (Signed)
[facility] D[facility]. [facility] [address] [place], [place] [address] Phone: [phone] Subjective:    I'm seeing this patient by the request  of:  Danise EdgeStacey Blyth, MD   CC: left shoulder pain f/u  [id number]:Subjective  Alicia Cox is a 46 y.o. female coming in with complaint of left shoulder pain. Patient was found to have frozen shoulder. Was given an injection. Patient states that she is approximate 65% better with doing the home exercises. Patient states that still some mild discomfort in the neck for more of the shoulder still seems to be the irritation. Still some mild limitation of range of motion. States though that seems to be improving and is sleeping more comfortably at night.      Past Medical History:  Diagnosis Date  . Acute bronchitis 11/03/2013  . Allergy    hay fever  . Arthritis   . Blood in stool   . Cervical cancer screening 09/14/2016   Menarche at 13 Regular and heavy flow no history of abnormal pap in past G2P2, s/p 2 svd No history of abnormal MGM No concerns today  gyn surgeries: tubal and endometrial ablation LMP 09/13/2016  . Chest pain 08/24/2013  . Chicken pox as a child  . Dermatitis 03/02/2016  . Frequent headaches   . GERD (gastroesophageal reflux disease)   . Heart murmur   . Hypothyroidism    Hashimoto's  . Knee pain, left 01/09/2014  . Migraine   . Obesity 11/15/2014  . Preventative health care 09/12/2015  . TOS (thoracic outlet syndrome) 08/25/2013  . UTI (urinary tract infection)   . Vitamin D deficiency    Past Surgical History:  Procedure Laterality Date  . APPENDECTOMY    . CESAREAN SECTION     twice  . CHOLECYSTECTOMY    . ENDOMETRIAL ABLATION    . TONSILLECTOMY    . TUBAL LIGATION  1999   Social History   Social History  . Marital status: Married    Spouse name: N/A  . Number of children: N/A  . Years of education: N/A   Social History Main Topics  . Smoking status: Never Smoker  . Smokeless tobacco: Never Used       Comment: NEVER USED TOBACCO  . Alcohol use No  . Drug use: No  . 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 None   Social History Narrative  . None   No Known Allergies Family History  Problem Relation Age of Onset  . Hyperlipidemia Mother   . Hypertension Mother   . Diabetes Father     type 2  . Hypertension Sister   . Diabetes Sister   . Asthma Daughter   . Heart disease Maternal Grandmother   . Heart attack Maternal Grandfather   . Diabetes Paternal Grandmother   . Diabetes Paternal Grandfather     Past medical history, social, surgical and family history all reviewed in electronic medical record.  No pertanent information unless stated regarding to the chief complaint.   Review of Systems: No headache, visual changes, nausea, vomiting, diarrhea, constipation, dizziness, abdominal pain, skin rash, fevers, chills, night sweats, weight loss, swollen lymph nodes, body aches, joint swelling, muscle aches, chest pain, shortness of breath, mood changes.     Objective  Blood pressure (!) 132/100, pulse 78, height 5\' 4"  (1.626 m), weight  261 lb (118.4 kg).   Systems examined below as of 02/07/17 General: NAD A&O x3 mood, affect normal  HEENT: Pupils equal, extraocular movements intact no nystagmus Respiratory: not short of breath at rest or with speaking Cardiovascular: No lower extremity edema, non tender Skin: Warm dry intact with no signs of infection or rash on extremities or on axial skeleton. Abdomen: Soft nontender, no masses Neuro: Cranial nerves  intact, neurovascularly intact in all extremities with 2+ DTRs and 2+ pulses. Lymph: No lymphadenopathy appreciated today  Gait normal with good balance and coordination.  MSK: Non tender with full range of motion and good stability and symmetric strength and tone of , elbows, wrist,  knee hips and ankles  bilaterally.    Neck: Inspection unremarkable. No palpable stepoffs. Negative Spurling's maneuver. Full neck range of motion Grip strength and sensation normal in bilateral hands Strength good C4 to T1 distribution No sensory change to C4 to T1 Negative Hoffman sign bilaterally Reflexes normal  Shoulder: Inspection reveals no abnormalities, atrophy or asymmetry. Palpation is normal with no tenderness over AC joint or bicipital groove. Mild limitation lacking the last 10 of forward flexion. Patient also lacks last 5 of external rotation. Rotator cuff strength normal throughout. No signs of impingement with negative Neer and Hawkin's tests, empty can sign. Speeds and Yergason's tests normal. No labral pathology noted with negative Obrien's, negative clunk and good stability. Normal scapular function observed. No painful arc and no drop arm sign. No apprehension sign       Impression and Recommendations:     This case required medical decision making of moderate complexity.      Note: This dictation was prepared with Dragon dictation along with smaller phrase technology. Any transcriptional errors that result from this process are unintentional.

## 2017-02-12 MED FILL — ESCITALOPRAM 10 MG TABLET: 10 | 30 days supply | Qty: 30 | Fill #2

## 2017-03-07 ENCOUNTER — Ambulatory Visit: Payer: BLUE CROSS/BLUE SHIELD | Admitting: Family Medicine

## 2017-03-09 ENCOUNTER — Ambulatory Visit (INDEPENDENT_AMBULATORY_CARE_PROVIDER_SITE_OTHER): Payer: BLUE CROSS/BLUE SHIELD | Admitting: Family Medicine

## 2017-03-09 ENCOUNTER — Encounter: Payer: Self-pay | Admitting: Family Medicine

## 2017-03-09 DIAGNOSIS — E669 Obesity, unspecified: Secondary | ICD-10-CM | POA: Diagnosis not present

## 2017-03-09 DIAGNOSIS — R739 Hyperglycemia, unspecified: Secondary | ICD-10-CM

## 2017-03-09 DIAGNOSIS — F419 Anxiety disorder, unspecified: Secondary | ICD-10-CM

## 2017-03-09 DIAGNOSIS — E039 Hypothyroidism, unspecified: Secondary | ICD-10-CM

## 2017-03-09 DIAGNOSIS — E782 Mixed hyperlipidemia: Secondary | ICD-10-CM | POA: Diagnosis not present

## 2017-03-09 DIAGNOSIS — IMO0001 Reserved for inherently not codable concepts without codable children: Secondary | ICD-10-CM

## 2017-03-09 DIAGNOSIS — K219 Gastro-esophageal reflux disease without esophagitis: Secondary | ICD-10-CM | POA: Diagnosis not present

## 2017-03-09 LAB — COMPREHENSIVE METABOLIC PANEL
ALBUMIN: 3.9 g/dL (ref 3.5–5.2)
ALT: 20 U/L (ref 0–35)
AST: 17 U/L (ref 0–37)
Alkaline Phosphatase: 54 U/L (ref 39–117)
BUN: 9 mg/dL (ref 6–23)
CALCIUM: 9.2 mg/dL (ref 8.4–10.5)
CO2: 25 meq/L (ref 19–32)
Chloride: 105 mEq/L (ref 96–112)
Creatinine, Ser: 0.71 mg/dL (ref 0.40–1.20)
GFR: 94.61 mL/min (ref >60.00)
Glucose, Bld: 114 mg/dL — ABNORMAL HIGH (ref 70–99)
Potassium: 4 mEq/L (ref 3.5–5.1)
Sodium: 137 mEq/L (ref 135–145)
Total Bilirubin: 0.3 mg/dL (ref 0.2–1.2)
Total Protein: 7.4 g/dL (ref 6.0–8.3)

## 2017-03-09 LAB — LIPID PANEL
CHOL/HDL RATIO: 5
CHOLESTEROL: 188 mg/dL (ref 0–200)
HDL: 39.3 mg/dL (ref >39.00)
LDL Cholesterol: 117 mg/dL — ABNORMAL HIGH (ref 0–99)
NonHDL: 148.67
TRIGLYCERIDES: 159 mg/dL — AB (ref 0.0–149.0)
VLDL: 31.8 mg/dL (ref 0.0–40.0)

## 2017-03-09 LAB — HEMOGLOBIN A1C: HEMOGLOBIN A1C: 6.1 % (ref 4.6–6.5)

## 2017-03-09 LAB — CBC
HCT: 44.7 % (ref 36.0–46.0)
HEMOGLOBIN: 15.2 g/dL — AB (ref 12.0–15.0)
MCHC: 34.1 g/dL (ref 30.0–36.0)
MCV: 97.3 fl (ref 78.0–100.0)
PLATELETS: 357 10*3/uL (ref 150.0–400.0)
RBC: 4.59 Mil/uL (ref 3.87–5.11)
RDW: 12.5 % (ref 11.5–15.5)
WBC: 7.8 10*3/uL (ref 4.0–10.5)

## 2017-03-09 LAB — TSH: TSH: 4.1 u[IU]/mL (ref 0.35–4.50)

## 2017-03-09 MED ORDER — ALPRAZOLAM 0.25 MG PO TABS: 0.2500 mg | ORAL_TABLET | Freq: Two times a day (BID) | ORAL | 0 refills | Status: DC | PRN

## 2017-03-09 MED ORDER — MONTELUKAST SODIUM 10 MG PO TABS: 10.0000 mg | ORAL_TABLET | Freq: Every day | ORAL | 2 refills | Status: DC

## 2017-03-09 MED FILL — MONTELUKAST SOD 10 MG TAB: 10 | 30 days supply | Qty: 30 | Fill #0

## 2017-03-09 MED FILL — ALPRAZolam 0.25 MG TABS: 0.25 | 10 days supply | Qty: 20 | Fill #0

## 2017-03-09 MED FILL — ESCITALOPRAM 10 MG TABLET: 10 | 30 days supply | Qty: 30 | Fill #3

## 2017-03-09 NOTE — Assessment & Plan Note (Addendum)
hgba1c acceptable, minimize simple carbs. Increase exercise as tolerated.  

## 2017-03-09 NOTE — Progress Notes (Signed)
Patient ID: Alicia Cox, female   DOB: 21-Mar-1971, 46 y.o.   MRN: [id number]   Subjective:  I acted as a Neurosurgeon for Alicia Edge, MD. Alicia Cox, [place]   Patient ID: Alicia Cox, female    DOB: 1971-10-18, 46 y.o.   MRN: [id number]  Chief Complaint  Patient presents with  . Follow-up    54-month F/U from CPE.    Gastroesophageal Reflux  She reports no chest pain or no coughing.  Arthritis  Pertinent negatives include no fever or rash.    Patient is in today for a 55-month follow up. Patient has a Hx of hypothyroidism, arthritis, GERD. Patient has no acute concerns noted at this time. She struggles with fatigue as she cares for her family, her husband's poor health and her job but she feels it is appropriate to her level of activity. Denies CP/palp/SOB/HA/congestion/fevers/GI or GU c/o. Taking meds as prescribed  Patient Care Team: Bradd Canary, MD as PCP - General (Family Medicine)   Past Medical History:  Diagnosis Date  . Acute bronchitis 11/03/2013  . Allergy    hay fever  . Arthritis   . Blood in stool   . Cervical cancer screening 09/14/2016   Menarche at 13 Regular and heavy flow no history of abnormal pap in past G2P2, s/p 2 svd No history of abnormal MGM No concerns today  gyn surgeries: tubal and endometrial ablation LMP 09/13/2016  . Chest pain 08/24/2013  . Chicken pox as a child  . Dermatitis 03/02/2016  . Frequent headaches   . GERD (gastroesophageal reflux disease)   . Heart murmur   . Hypothyroidism    Hashimoto's  . Knee pain, left 01/09/2014  . Migraine   . Obesity 11/15/2014  . Preventative health care 09/12/2015  . TOS (thoracic outlet syndrome) 08/25/2013  . UTI (urinary tract infection)   . Vitamin D deficiency     Past Surgical History:  Procedure Laterality Date  . APPENDECTOMY    . CESAREAN SECTION     twice  . CHOLECYSTECTOMY    . ENDOMETRIAL ABLATION    . TONSILLECTOMY    . TUBAL LIGATION  1999    Family History  Problem Relation Age  of Onset  . Hyperlipidemia Mother   . Hypertension Mother   . Skin cancer Mother   . Diabetes Father     type 2  . Hypertension Sister   . Diabetes Sister   . Asthma Daughter   . Heart disease Maternal Grandmother   . Heart attack Maternal Grandfather   . Diabetes Paternal Grandmother   . Diabetes Paternal Grandfather     Social History   Social History  . Marital status: Married    Spouse name: N/A  . Number of children: N/A  . Years of education: N/A   Occupational History  . Not on file.   Social History Main Topics  . Smoking status: Never Smoker  . Smokeless tobacco: Never Used     Comment: NEVER USED TOBACCO  . Alcohol use No  . Drug use: No  . 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 Not on file   Social History Narrative  . No narrative on file    Outpatient Medications Prior to Visit  Medication Sig Dispense Refill  . albuterol (PROVENTIL HFA;VENTOLIN HFA) 108 (90 Base) MCG/ACT inhaler Inhale  2 puffs into the lungs every 6 (six) hours as needed for wheezing or shortness of breath. 1 Inhaler 0  . cholecalciferol (VITAMIN D) 1000 UNITS tablet Take 10,000 Units by mouth once a week.    . cyclobenzaprine (FLEXERIL) 10 MG tablet Take 1 tablet (10 mg total) by mouth at bedtime as needed for muscle spasms. 30 tablet 2  . Diclofenac Sodium (PENNSAID) 2 % SOLN Place 2 application onto the skin 2 (two) times daily. 112 g 3  . escitalopram (LEXAPRO) 10 MG tablet TAKE 1 TABLET BY MOUTH DAILY 30 tablet 3  . gabapentin (NEURONTIN) 100 MG capsule Take 2 capsules (200 mg total) by mouth at bedtime. 60 capsule 3  . levothyroxine (SYNTHROID, LEVOTHROID) 100 MCG tablet TAKE 1 TABLET BY MOUTH DAILY EXCEPT TAKE 1.5 TABLETS ON THURSDAYS AND SUNDAYS 105 tablet 3  . meloxicam (MOBIC) 15 MG tablet Take 1 tablet (15 mg total) by mouth daily. 30 tablet 0  . nystatin cream  (MYCOSTATIN) Apply 1 application topically 2 (two) times daily. 30 g 1  . ranitidine (ZANTAC) 300 MG tablet Take 1 tablet (300 mg total) by mouth at bedtime. 30 tablet 6  . triamcinolone cream (KENALOG) 0.1 % Apply 1 application topically 2 (two) times daily as needed. 30 g 0  . Vitamin D, Ergocalciferol, (DRISDOL) 50000 units CAPS capsule Take 1 capsule (50,000 Units total) by mouth every 7 (seven) days. 12 capsule 0  . ALPRAZolam (XANAX) 0.25 MG tablet Take 1 tablet (0.25 mg total) by mouth 2 (two) times daily as needed for anxiety. 20 tablet 0  . montelukast (SINGULAIR) 10 MG tablet Take 1 tablet (10 mg total) by mouth at bedtime. 30 tablet 2   No facility-administered medications prior to visit.     No Known Allergies  Review of Systems  Constitutional: Positive for malaise/fatigue. Negative for fever.  HENT: Negative for congestion.   Eyes: Negative for blurred vision.  Respiratory: Negative for cough and shortness of breath.   Cardiovascular: Negative for chest pain, palpitations and leg swelling.  Gastrointestinal: Negative for vomiting.  Musculoskeletal: Positive for arthritis. Negative for back pain.  Skin: Negative for rash.  Neurological: Negative for loss of consciousness and headaches.       Objective:    Physical Exam  Constitutional: She is oriented to person, place, and time. She appears well-developed and well-nourished. No distress.  HENT:  Head: Normocephalic and atraumatic.  Eyes: Conjunctivae are normal.  Neck: Normal range of motion. No thyromegaly present.  Cardiovascular: Normal rate and regular rhythm.   Pulmonary/Chest: Effort normal and breath sounds normal. She has no wheezes.  Abdominal: Soft. Bowel sounds are normal. There is no tenderness.  Musculoskeletal: She exhibits no edema or deformity.  Neurological: She is alert and oriented to person, place, and time.  Skin: Skin is warm and dry. She is not diaphoretic.  Psychiatric: She has a normal mood  and affect.    BP 116/73 (BP Location: Left Wrist, Patient Position: Sitting, Cuff Size: Large)   Pulse 73   Temp 97.7 F (36.5 C) (Oral)   Wt 263 lb (119.3 kg)   SpO2 96% Comment: RA  BMI 45.14 kg/m  Wt Readings from Last 3 Encounters:  03/09/17 263 lb (119.3 kg)  02/07/17 261 lb (118.4 kg)  01/17/17 263 lb (119.3 kg)   BP Readings from Last 3 Encounters:  03/09/17 116/73  02/07/17 (!) 132/100  01/17/17 132/80     Immunization History  Administered Date(s) Administered  .  Influenza,inj,Quad PF,36+ Mos 10/21/2013, 08/14/2014, 09/02/2015, 09/14/2016  . Tdap 10/21/2013    Health Maintenance  Topic Date Due  . HIV Screening  03/04/1987  . MAMMOGRAM  10/24/2017  . PAP SMEAR  09/15/2019  . TETANUS/TDAP  10/22/2023  . INFLUENZA VACCINE  Completed    Lab Results  Component Value Date   WBC 7.8 03/09/2017   HGB 15.2 (H) 03/09/2017   HCT 44.7 03/09/2017   PLT 357.0 03/09/2017   GLUCOSE 114 (H) 03/09/2017   CHOL 188 03/09/2017   TRIG 159.0 (H) 03/09/2017   HDL 39.30 03/09/2017   LDLDIRECT 117.0 09/14/2016   LDLCALC 117 (H) 03/09/2017   ALT 20 03/09/2017   AST 17 03/09/2017   NA 137 03/09/2017   K 4.0 03/09/2017   CL 105 03/09/2017   CREATININE 0.71 03/09/2017   BUN 9 03/09/2017   CO2 25 03/09/2017   TSH 4.10 03/09/2017   HGBA1C 6.1 03/09/2017    Lab Results  Component Value Date   TSH 4.10 03/09/2017   Lab Results  Component Value Date   WBC 7.8 03/09/2017   HGB 15.2 (H) 03/09/2017   HCT 44.7 03/09/2017   MCV 97.3 03/09/2017   PLT 357.0 03/09/2017   Lab Results  Component Value Date   NA 137 03/09/2017   K 4.0 03/09/2017   CO2 25 03/09/2017   GLUCOSE 114 (H) 03/09/2017   BUN 9 03/09/2017   CREATININE 0.71 03/09/2017   BILITOT 0.3 03/09/2017   ALKPHOS 54 03/09/2017   AST 17 03/09/2017   ALT 20 03/09/2017   PROT 7.4 03/09/2017   ALBUMIN 3.9 03/09/2017   CALCIUM 9.2 03/09/2017   GFR 94.61 03/09/2017   Lab Results  Component Value Date    CHOL 188 03/09/2017   Lab Results  Component Value Date   HDL 39.30 03/09/2017   Lab Results  Component Value Date   LDLCALC 117 (H) 03/09/2017   Lab Results  Component Value Date   TRIG 159.0 (H) 03/09/2017   Lab Results  Component Value Date   CHOLHDL 5 03/09/2017   Lab Results  Component Value Date   HGBA1C 6.1 03/09/2017         Assessment & Plan:   Problem List Items Addressed This Visit    GERD (gastroesophageal reflux disease)    Avoid offending foods, start probiotics. Do not eat large meals in late evening and consider raising head of bed.       Relevant Orders   CBC (Completed)   Hypothyroidism    On Levothyroxine, continue to monitor      Relevant Orders   Comprehensive metabolic panel (Completed)   TSH (Completed)   Hyperglycemia    hgba1c acceptable, minimize simple carbs. Increase exercise as tolerated.       Relevant Orders   Hemoglobin A1c (Completed)   Comprehensive metabolic panel (Completed)   Hyperlipidemia, mixed    Encouraged heart healthy diet, increase exercise, avoid trans fats, consider a krill oil cap daily      Obesity    Encouraged DASH diet, decrease po intake and increase exercise as tolerated. Needs 7-8 hours of sleep nightly. Avoid trans fats, eat small, frequent meals every 4-5 hours with lean proteins, complex carbs and healthy fats. Minimize simple carbs, referred bariatrics      Relevant Orders   Comprehensive metabolic panel (Completed)   Lipid panel (Completed)    Other Visit Diagnoses    Anxiety       Relevant Medications  ALPRAZolam (XANAX) 0.25 MG tablet      I am having Ms. Degregory maintain her cholecalciferol, cyclobenzaprine, triamcinolone cream, ranitidine, nystatin cream, albuterol, Diclofenac Sodium, escitalopram, levothyroxine, Vitamin D (Ergocalciferol), meloxicam, gabapentin, ALPRAZolam, and montelukast.  Meds ordered this encounter  Medications  . ALPRAZolam (XANAX) 0.25 MG tablet    Sig:  Take 1 tablet (0.25 mg total) by mouth 2 (two) times daily as needed for anxiety.    Dispense:  20 tablet    Refill:  0  . montelukast (SINGULAIR) 10 MG tablet    Sig: Take 1 tablet (10 mg total) by mouth at bedtime.    Dispense:  30 tablet    Refill:  2    CMA served as scribe during this visit. History, Physical and Plan performed by medical provider. Documentation and orders reviewed and attested to.  Alicia EdgeStacey Blyth, MD

## 2017-03-09 NOTE — Assessment & Plan Note (Signed)
Encouraged heart healthy diet, increase exercise, avoid trans fats, consider a krill oil cap daily 

## 2017-03-09 NOTE — Assessment & Plan Note (Signed)
On Levothyroxine, continue to monitor 

## 2017-03-09 NOTE — Progress Notes (Signed)
Pre visit review using our clinic review tool, if applicable. No additional management support is needed unless otherwise documented below in the visit note. 

## 2017-03-09 NOTE — Assessment & Plan Note (Signed)
Avoid offending foods, start probiotics. Do not eat large meals in late evening and consider raising head of bed.  

## 2017-03-09 NOTE — Patient Instructions (Signed)
Carbohydrate Counting , Adult Carbohydrate counting is a method for keeping track of how many carbohydrates you eat. Eating carbohydrates naturally increases the amount of sugar (glucose) in the blood. Counting how many carbohydrates you eat helps keep your blood glucose within normal limits, which helps you manage your diabetes (diabetes mellitus). It is important to know how many carbohydrates you can safely have in each meal. This is different for every person. A diet and nutrition specialist (registered dietitian) can help you make a meal plan and calculate how many carbohydrates you should have at each meal and snack. Carbohydrates are found in the following foods:  Grains, such as breads and cereals.  Dried beans and soy products.  Starchy vegetables, such as potatoes, peas, and corn.  Fruit and fruit juices.  Milk and yogurt.  Sweets and snack foods, such as cake, cookies, candy, chips, and soft drinks. How do I count carbohydrates? There are two ways to count carbohydrates in food. You can use either of the methods or a combination of both. Reading "Nutrition Facts" on packaged food  The "Nutrition Facts" list is included on the labels of almost all packaged foods and beverages in the [place] It includes:  The serving size.  Information about nutrients in each serving, including the grams (g) of carbohydrate per serving. To use the "Nutrition Facts":  Decide how many servings you will have.  Multiply the number of servings by the number of carbohydrates per serving.  The resulting number is the total amount of carbohydrates that you will be having. Learning standard serving sizes of other foods  When you eat foods containing carbohydrates that are not packaged or do not include "Nutrition Facts" on the label, you need to measure the servings in order to count the amount of carbohydrates:  Measure the foods that you will eat with a food scale or measuring cup, if  needed.  Decide how many standard-size servings you will eat.  Multiply the number of servings by 15. Most carbohydrate-rich foods have about 15 g of carbohydrates per serving.  For example, if you eat 8 oz (170 g) of strawberries, you will have eaten 2 servings and 30 g of carbohydrates (2 servings x 15 g = 30 g).  For foods that have more than one food mixed, such as soups and casseroles, you must count the carbohydrates in each food that is included. The following list contains standard serving sizes of common carbohydrate-rich foods. Each of these servings has about 15 g of carbohydrates:   hamburger bun or  English muffin.   oz (15 mL) syrup.   oz (14 g) jelly.  1 slice of bread.  1 six-inch tortilla.  3 oz (85 g) cooked rice or pasta.  4 oz (113 g) cooked dried beans.  4 oz (113 g) starchy vegetable, such as peas, corn, or potatoes.  4 oz (113 g) hot cereal.  4 oz (113 g) mashed potatoes or  of a large baked potato.  4 oz (113 g) canned or frozen fruit.  4 oz (120 mL) fruit juice.  4-6 crackers.  6 chicken nuggets.  6 oz (170 g) unsweetened dry cereal.  6 oz (170 g) plain fat-free yogurt or yogurt sweetened with artificial sweeteners.  8 oz (240 mL) milk.  8 oz (170 g) fresh fruit or one small piece of fruit.  24 oz (680 g) popped popcorn. Example of carbohydrate counting Sample meal   3 oz (85 g) chicken breast.  6 oz (170  g) brown rice.  4 oz (113 g) corn.  8 oz (240 mL) milk.  8 oz (170 g) strawberries with sugar-free whipped topping. Carbohydrate calculation  1. Identify the foods that contain carbohydrates:  Rice.  Corn.  Milk.  Strawberries. 2. Calculate how many servings you have of each food:  2 servings rice.  1 serving corn.  1 serving milk.  1 serving strawberries. 3. Multiply each number of servings by 15 g:  2 servings rice x 15 g = 30 g.  1 serving corn x 15 g = 15 g.  1 serving milk x 15 g = 15 g.  1  serving strawberries x 15 g = 15 g. 4. Add together all of the amounts to find the total grams of carbohydrates eaten:  30 g + 15 g + 15 g + 15 g = 75 g of carbohydrates total. This information is not intended to replace advice given to you by your health care provider. Make sure you discuss any questions you have with your health care provider. Document Released: 12/04/2005 Document Revised: 06/23/2016 Document Reviewed: 05/17/2016 Elsevier Interactive Patient Education  2017 [employer]

## 2017-03-09 NOTE — Assessment & Plan Note (Signed)
Encouraged DASH diet, decrease po intake and increase exercise as tolerated. Needs 7-8 hours of sleep nightly. Avoid trans fats, eat small, frequent meals every 4-5 hours with lean proteins, complex carbs and healthy fats. Minimize simple carbs, referred bariatrics

## 2017-03-28 ENCOUNTER — Ambulatory Visit (INDEPENDENT_AMBULATORY_CARE_PROVIDER_SITE_OTHER): Payer: BLUE CROSS/BLUE SHIELD | Admitting: Family Medicine

## 2017-03-28 ENCOUNTER — Encounter: Payer: Self-pay | Admitting: Family Medicine

## 2017-03-28 DIAGNOSIS — M7502 Adhesive capsulitis of left shoulder: Secondary | ICD-10-CM | POA: Diagnosis not present

## 2017-03-28 NOTE — Progress Notes (Signed)
[name] [address] [place], [place] [address] Phone: [phone] Subjective:    CC: Neck and shoulder pain.  [id number]  Alicia Cox is a 46 y.o. female coming in with complaint of neck and shoulder pain. 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 Patient was making progress with home exercises. Was to work on posture as well. Patient was taking gabapentin at night. Patient states she is doing much better. Very minimal pain from time to time. Do daily activities. Exercises have been helpful. Started titrating down off the gabapentin. Some increase stress but nothing severe.     Past Medical History:  Diagnosis Date  . Acute bronchitis 11/03/2013  . Allergy    hay fever  . Arthritis   . Blood in stool   . Cervical cancer screening 09/14/2016   Menarche at 13 Regular and heavy flow no history of abnormal pap in past G2P2, s/p 2 svd No history of abnormal MGM No concerns today  gyn surgeries: tubal and endometrial ablation LMP 09/13/2016  . Chest pain 08/24/2013  . Chicken pox as a child  . Dermatitis 03/02/2016  . Frequent headaches   . GERD (gastroesophageal reflux disease)   . Heart murmur   . Hypothyroidism    Hashimoto's  . Knee pain, left 01/09/2014  . Migraine   . Obesity 11/15/2014  . Preventative health care 09/12/2015  . TOS (thoracic outlet syndrome) 08/25/2013  . UTI (urinary tract infection)   . Vitamin D deficiency    Past Surgical History:  Procedure Laterality Date  . APPENDECTOMY    . CESAREAN SECTION     twice  . CHOLECYSTECTOMY    . ENDOMETRIAL ABLATION    . TONSILLECTOMY    . TUBAL LIGATION  1999   Social History   Social History  . Marital status: Married    Spouse name: N/A  . Number of children: N/A  . Years of education: N/A   Social History Main Topics  . Smoking status: Never Smoker  . Smokeless tobacco: Never Used     Comment: NEVER USED TOBACCO  .  Alcohol use No  . Drug use: No  . 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 None   Social History Narrative  . None   No Known Allergies Family History  Problem Relation Age of Onset  . Hyperlipidemia Mother   . Hypertension Mother   . Skin cancer Mother   . Diabetes Father     type 2  . Hypertension Sister   . Diabetes Sister   . Asthma Daughter   . Heart disease Maternal Grandmother   . Heart attack Maternal Grandfather   . Diabetes Paternal Grandmother   . Diabetes Paternal Grandfather     Past medical history, social, surgical and family history all reviewed in electronic medical record.  No pertanent information unless stated regarding to the chief complaint.   Review of Systems:Review of systems updated and as accurate as of 03/28/17  No headache, visual changes, nausea, vomiting, diarrhea, constipation, dizziness, abdominal pain, skin rash, fevers, chills, night sweats, weight loss, swollen lymph nodes, body aches, joint swelling, muscle aches, chest pain, shortness of breath, mood changes.   Objective  There were no vitals taken for this visit. Systems examined below  as of 03/28/17   General: No apparent distress alert and oriented x3 mood and affect normal, dressed appropriately.  HEENT: Pupils equal, extraocular movements intact  Respiratory: Patient's speak in full sentences and does not appear short of breath  Cardiovascular: No lower extremity edema, non tender, no erythema  Skin: Warm dry intact with no signs of infection or rash on extremities or on axial skeleton.  Abdomen: Soft nontender  Neuro: Cranial nerves II through XII are intact, neurovascularly intact in all extremities with 2+ DTRs and 2+ pulses.  Lymph: No lymphadenopathy of posterior or anterior cervical chain or axillae bilaterally.  Gait normal with good balance and coordination.    MSK:  Non tender with full range of motion and good stability and symmetric strength and tone of  elbows, wrist, hip, knee and ankles bilaterally.   Shoulder: Left Inspection reveals no abnormalities, atrophy or asymmetry. Palpation is normal with no tenderness over AC joint or bicipital groove. ROM is full in all planes. Rotator cuff strength normal throughout. No signs of impingement with negative Neer and Hawkin's tests, empty can sign. Speeds and Yergason's tests normal. No labral pathology noted with negative Obrien's, negative clunk and good stability. Normal scapular function observed. No painful arc and no drop arm sign. No apprehension sign Contralateral shoulder unremarkable.      Impression and Recommendations:     This case required medical decision making of moderate complexity.      Note: This dictation was prepared with Dragon dictation along with smaller phrase technology. Any transcriptional errors that result from this process are unintentional.

## 2017-03-28 NOTE — Patient Instructions (Addendum)
Good to see you  You have done great! Ice 20 minutes 2 times daily. Usually after activity and before bed. Can continue the gabapentin if you would like or start to decrease dose.

## 2017-03-28 NOTE — Assessment & Plan Note (Signed)
Doing much better at this time. Discussed icing regimen, continue conservative therapy intermittently. Follow-up as needed.

## 2017-03-28 NOTE — Progress Notes (Signed)
Pre-visit discussion using our clinic review tool. No additional management support is needed unless otherwise documented below in the visit note.  

## 2017-04-17 ENCOUNTER — Other Ambulatory Visit: Payer: Self-pay | Admitting: Medical

## 2017-04-17 MED FILL — MONTELUKAST SOD 10 MG TAB: 10 | 30 days supply | Qty: 30 | Fill #1

## 2017-04-17 MED FILL — GABAPENTIN 100 MG CAPSULE: 100 | 30 days supply | Qty: 60 | Fill #1

## 2017-04-25 ENCOUNTER — Other Ambulatory Visit: Payer: Self-pay | Admitting: Family Medicine

## 2017-04-26 MED FILL — ESCITALOPRAM 10 MG TABLET: 10 | 30 days supply | Qty: 30 | Fill #0

## 2017-05-17 MED FILL — MONTELUKAST SOD 10 MG TAB: 10 | 30 days supply | Qty: 30 | Fill #2

## 2017-05-30 MED FILL — ESCITALOPRAM 10 MG TABLET: 10 | 30 days supply | Qty: 30 | Fill #1

## 2017-07-03 MED FILL — ESCITALOPRAM 10 MG TABLET: 10 | 30 days supply | Qty: 30 | Fill #2

## 2017-08-02 MED FILL — GABAPENTIN 100 MG CAPSULE: 100 | 30 days supply | Qty: 60 | Fill #2

## 2017-08-02 MED FILL — ESCITALOPRAM 10 MG TABLET: 10 | 30 days supply | Qty: 30 | Fill #3

## 2017-08-23 ENCOUNTER — Ambulatory Visit (HOSPITAL_BASED_OUTPATIENT_CLINIC_OR_DEPARTMENT_OTHER)
Admission: RE | Admit: 2017-08-23 | Discharge: 2017-08-23 | Disposition: A | Discharge status: Home / Self Care | Payer: BLUE CROSS/BLUE SHIELD | Source: Ambulatory Visit | Attending: Family Medicine | Admitting: Family Medicine

## 2017-08-23 ENCOUNTER — Encounter: Payer: Self-pay | Admitting: Family Medicine

## 2017-08-23 ENCOUNTER — Ambulatory Visit (INDEPENDENT_AMBULATORY_CARE_PROVIDER_SITE_OTHER): Payer: BLUE CROSS/BLUE SHIELD | Admitting: Family Medicine

## 2017-08-23 VITALS — BP 122/78 | HR 81 | Temp 98.1°F | Ht 64.0 in | Wt 245.4 lb

## 2017-08-23 DIAGNOSIS — M545 Low back pain, unspecified: Secondary | ICD-10-CM

## 2017-08-23 DIAGNOSIS — E039 Hypothyroidism, unspecified: Secondary | ICD-10-CM

## 2017-08-23 DIAGNOSIS — E669 Obesity, unspecified: Secondary | ICD-10-CM | POA: Diagnosis not present

## 2017-08-23 DIAGNOSIS — M25551 Pain in right hip: Secondary | ICD-10-CM | POA: Diagnosis not present

## 2017-08-23 DIAGNOSIS — N390 Urinary tract infection, site not specified: Secondary | ICD-10-CM

## 2017-08-23 DIAGNOSIS — IMO0001 Reserved for inherently not codable concepts without codable children: Secondary | ICD-10-CM

## 2017-08-23 DIAGNOSIS — M25552 Pain in left hip: Secondary | ICD-10-CM | POA: Diagnosis not present

## 2017-08-23 LAB — POCT URINALYSIS DIPSTICK
Bilirubin, UA: NEGATIVE
Blood, UA: NEGATIVE
Glucose, UA: NEGATIVE
KETONES UA: NEGATIVE
Leukocytes, UA: NEGATIVE
Nitrite, UA: NEGATIVE
PH UA: 6 (ref 5.0–8.0)
PROTEIN UA: NEGATIVE
UROBILINOGEN UA: 0.2 U/dL

## 2017-08-23 MED ORDER — TIZANIDINE HCL 2 MG PO TABS: 2.0000 mg | ORAL_TABLET | Freq: Every evening | ORAL | 2 refills | Status: DC | PRN

## 2017-08-23 MED ORDER — MONTELUKAST SODIUM 10 MG PO TABS: 10.0000 mg | ORAL_TABLET | Freq: Every day | ORAL | 1 refills | Status: DC

## 2017-08-23 MED ORDER — ESCITALOPRAM OXALATE 10 MG PO TABS: 10.0000 mg | ORAL_TABLET | Freq: Every day | ORAL | 1 refills | Status: DC

## 2017-08-23 MED FILL — MONTELUKAST SOD 10 MG TAB: 10 | 90 days supply | Qty: 90 | Fill #0

## 2017-08-23 MED FILL — tiZANidine HCL 2 MG TABS: 2 | 30 days supply | Qty: 30 | Fill #0

## 2017-08-23 NOTE — Patient Instructions (Addendum)
Tylenol Es 500 mg tabs 1-2 twice daily for next 2 weeks  Back Pain, Adult Back pain is very common. The pain often gets better over time. The cause of back pain is usually not dangerous. Most people can learn to manage their back pain on their own. Follow these instructions at home: Watch your back pain for any changes. The following actions may help to lessen any pain you are feeling:  Stay active. Start with short walks on flat ground if you can. Try to walk farther each day.  Exercise regularly as told by your doctor. Exercise helps your back heal faster. It also helps avoid future injury by keeping your muscles strong and flexible.  Do not sit, drive, or stand in one place for more than 30 minutes.  Do not stay in bed. Resting more than 1-2 days can slow down your recovery.  Be careful when you bend or lift an object. Use good form when lifting: ? Bend at your knees. ? Keep the object close to your body. ? Do not twist.  Sleep on a firm mattress. Lie on your side, and bend your knees. If you lie on your back, put a pillow under your knees.  Take medicines only as told by your doctor.  Put ice on the injured area. ? Put ice in a plastic bag. ? Place a towel between your skin and the bag. ? Leave the ice on for 20 minutes, 2-3 times a day for the first 2-3 days. After that, you can switch between ice and heat packs.  Avoid feeling anxious or stressed. Find good ways to deal with stress, such as exercise.  Maintain a healthy weight. Extra weight puts stress on your back.  Contact a doctor if:  You have pain that does not go away with rest or medicine.  You have worsening pain that goes down into your legs or buttocks.  You have pain that does not get better in one week.  You have pain at night.  You lose weight.  You have a fever or chills. Get help right away if:  You cannot control when you poop (bowel movement) or pee (urinate).  Your arms or legs feel  weak.  Your arms or legs lose feeling (numbness).  You feel sick to your stomach (nauseous) or throw up (vomit).  You have belly (abdominal) pain.  You feel like you may pass out (faint). This information is not intended to replace advice given to you by your health care provider. Make sure you discuss any questions you have with your health care provider. Document Released: 05/22/2008 Document Revised: 05/11/2016 Document Reviewed: 04/07/2014 Elsevier Interactive Patient Education  [employer]2018 Elsevier Inc.

## 2017-08-24 LAB — URINE CULTURE
MICRO NUMBER:: 80979477
SPECIMEN QUALITY: ADEQUATE

## 2017-08-26 ENCOUNTER — Encounter: Payer: Self-pay | Admitting: Family Medicine

## 2017-08-26 DIAGNOSIS — M545 Low back pain, unspecified: Secondary | ICD-10-CM

## 2017-08-26 HISTORY — DX: Low back pain, unspecified: M54.50

## 2017-08-26 NOTE — Assessment & Plan Note (Signed)
Encouraged moist heat and gentle stretching as tolerated. May try NSAIDs and prescription meds as directed and report if symptoms worsen or seek immediate care. Cyclobenzaprine prn. Xray unremarkable. Notify if pain does not improve

## 2017-08-26 NOTE — Assessment & Plan Note (Signed)
On Levothyroxine, continue to monitor 

## 2017-08-26 NOTE — Assessment & Plan Note (Signed)
Encouraged DASH diet, decrease po intake and increase exercise as tolerated. Needs 7-8 hours of sleep nightly. Avoid trans fats, eat small, frequent meals every 4-5 hours with lean proteins, complex carbs and healthy fats. Minimize simple carbs 

## 2017-08-26 NOTE — Progress Notes (Signed)
Patient ID: Alicia Cox, female   DOB: August 07, 1971, 46 y.o.   MRN: [id number]   Subjective:    Patient ID: Alicia Cox, female    DOB: 11/17/1971, 46 y.o.   MRN: [id number]  Chief Complaint  Patient presents with  . Back Pain    Patient is here today C/O lower back pain with lower abd pain and is worried that she may have a UTI.  Sx started 1 week ago.    HPI Patient is in today for evaluation of low back pain and lower pelvic pain.  No falls or trauma. No incontinence but some urinary frequency and urgency is noted. No dysuria or hematuria. No fevers or chills. Has trouble getting comfortable due to her level of pain. Denies CP/palp/SOB/HA/congestion/fevers/GI or GU c/o. Taking meds as prescribed  Past Medical History:  Diagnosis Date  . Acute bronchitis 11/03/2013  . Allergy    hay fever  . Arthritis   . Blood in stool   . Cervical cancer screening 09/14/2016   Menarche at 13 Regular and heavy flow no history of abnormal pap in past G2P2, s/p 2 svd No history of abnormal MGM No concerns today  gyn surgeries: tubal and endometrial ablation LMP 09/13/2016  . Chest pain 08/24/2013  . Chicken pox as a child  . Dermatitis 03/02/2016  . Frequent headaches   . GERD (gastroesophageal reflux disease)   . Heart murmur   . Hypothyroidism    Hashimoto's  . Knee pain, left 01/09/2014  . Low back pain 08/26/2017  . Migraine   . Obesity 11/15/2014  . Preventative health care 09/12/2015  . TOS (thoracic outlet syndrome) 08/25/2013  . UTI (urinary tract infection)   . Vitamin D deficiency     Past Surgical History:  Procedure Laterality Date  . APPENDECTOMY    . CESAREAN SECTION     twice  . CHOLECYSTECTOMY    . ENDOMETRIAL ABLATION    . TONSILLECTOMY    . TUBAL LIGATION  1999    Family History  Problem Relation Age of Onset  . Hyperlipidemia Mother   . Hypertension Mother   . Skin cancer Mother   . Diabetes Father        type 2  . Hypertension Sister   . Diabetes Sister     . Asthma Daughter   . Heart disease Maternal Grandmother   . Heart attack Maternal Grandfather   . Diabetes Paternal Grandmother   . Diabetes Paternal Grandfather     Social History   Social History  . Marital status: Married    Spouse name: N/A  . Number of children: N/A  . Years of education: N/A   Occupational History  . Not on file.   Social History Main Topics  . Smoking status: Never Smoker  . Smokeless tobacco: Never Used     Comment: NEVER USED TOBACCO  . Alcohol use No  . Drug use: No  . 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 Not on file   Social History Narrative  . No narrative on file    Outpatient Medications Prior to Visit  Medication Sig Dispense Refill  . ALPRAZolam (XANAX) 0.25 MG tablet Take 1 tablet (0.25 mg total) by mouth 2 (two) times daily as needed for anxiety. 20 tablet 0  . cholecalciferol (VITAMIN D) 1000 UNITS tablet  Take 10,000 Units by mouth once a week.    . cyclobenzaprine (FLEXERIL) 10 MG tablet Take 1 tablet (10 mg total) by mouth at bedtime as needed for muscle spasms. 30 tablet 2  . Diclofenac Sodium (PENNSAID) 2 % SOLN Place 2 application onto the skin 2 (two) times daily. 112 g 3  . gabapentin (NEURONTIN) 100 MG capsule Take 2 capsules (200 mg total) by mouth at bedtime. 60 capsule 3  . levothyroxine (SYNTHROID, LEVOTHROID) 100 MCG tablet TAKE 1 TABLET BY MOUTH DAILY EXCEPT TAKE 1.5 TABLETS ON THURSDAYS AND SUNDAYS 105 tablet 3  . meloxicam (MOBIC) 15 MG tablet Take 1 tablet (15 mg total) by mouth daily. 30 tablet 0  . nystatin cream (MYCOSTATIN) Apply 1 application topically 2 (two) times daily. 30 g 1  . ranitidine (ZANTAC) 300 MG tablet Take 1 tablet (300 mg total) by mouth at bedtime. 30 tablet 6  . triamcinolone cream (KENALOG) 0.1 % Apply 1 application topically 2 (two) times daily as needed. 30 g 0  . VENTOLIN  HFA 108 (90 Base) MCG/ACT inhaler INHALE 2 PUFFS BY MOUTH INTO THE LUNGS EVERY 6 (SIX) HOURS AS NEEDED FOR WHEEZING OR SHORTNESS OF BREATH. 18 g 0  . Vitamin D, Ergocalciferol, (DRISDOL) 50000 units CAPS capsule Take 1 capsule (50,000 Units total) by mouth every 7 (seven) days. 12 capsule 0  . escitalopram (LEXAPRO) 10 MG tablet TAKE 1 TABLET BY MOUTH DAILY 30 tablet 3  . montelukast (SINGULAIR) 10 MG tablet Take 1 tablet (10 mg total) by mouth at bedtime. 30 tablet 2   No facility-administered medications prior to visit.     No Known Allergies  Review of Systems  Constitutional: Negative for fever and malaise/fatigue.  HENT: Negative for congestion.   Eyes: Negative for blurred vision.  Respiratory: Negative for shortness of breath.   Cardiovascular: Negative for chest pain, palpitations and leg swelling.  Gastrointestinal: Negative for abdominal pain, blood in stool and nausea.  Genitourinary: Positive for frequency. Negative for dysuria, hematuria and urgency.  Musculoskeletal: Positive for back pain. Negative for falls.  Skin: Negative for rash.  Neurological: Negative for dizziness, loss of consciousness and headaches.  Endo/Heme/Allergies: Negative for environmental allergies.  Psychiatric/Behavioral: Negative for depression. The patient is not nervous/anxious.        Objective:    Physical Exam  Constitutional: She is oriented to person, place, and time. She appears well-developed and well-nourished. No distress.  HENT:  Head: Normocephalic and atraumatic.  Nose: Nose normal.  Eyes: Right eye exhibits no discharge. Left eye exhibits no discharge.  Neck: Normal range of motion. Neck supple.  Cardiovascular: Normal rate and regular rhythm.   No murmur heard. Pulmonary/Chest: Effort normal and breath sounds normal.  Abdominal: Soft. Bowel sounds are normal. There is no tenderness.  Musculoskeletal: She exhibits no edema.  Neurological: She is alert and oriented to person,  place, and time.  Skin: Skin is warm and dry.  Psychiatric: She has a normal mood and affect.  Nursing note and vitals reviewed.   BP 122/78 (BP Location: Left Arm, Patient Position: Sitting, Cuff Size: Large)   Pulse 81   Temp 98.1 F (36.7 C) (Oral)   Ht  (1.626 m)   Wt 245 lb 6.4 oz (111.3 kg)   SpO2 96%   BMI 42.12 kg/m  Wt Readings from Last 3 Encounters:  08/23/17 245 lb 6.4 oz (111.3 kg)  03/28/17 265 lb (120.2 kg)  03/09/17 263 lb (119.3 kg)  Lab Results  Component Value Date   WBC 7.8 03/09/2017   HGB 15.2 (H) 03/09/2017   HCT 44.7 03/09/2017   PLT 357.0 03/09/2017   GLUCOSE 114 (H) 03/09/2017   CHOL 188 03/09/2017   TRIG 159.0 (H) 03/09/2017   HDL 39.30 03/09/2017   LDLDIRECT 117.0 09/14/2016   LDLCALC 117 (H) 03/09/2017   ALT 20 03/09/2017   AST 17 03/09/2017   NA 137 03/09/2017   K 4.0 03/09/2017   CL 105 03/09/2017   CREATININE 0.71 03/09/2017   BUN 9 03/09/2017   CO2 25 03/09/2017   TSH 4.10 03/09/2017   HGBA1C 6.1 03/09/2017    Lab Results  Component Value Date   TSH 4.10 03/09/2017   Lab Results  Component Value Date   WBC 7.8 03/09/2017   HGB 15.2 (H) 03/09/2017   HCT 44.7 03/09/2017   MCV 97.3 03/09/2017   PLT 357.0 03/09/2017   Lab Results  Component Value Date   NA 137 03/09/2017   K 4.0 03/09/2017   CO2 25 03/09/2017   GLUCOSE 114 (H) 03/09/2017   BUN 9 03/09/2017   CREATININE 0.71 03/09/2017   BILITOT 0.3 03/09/2017   ALKPHOS 54 03/09/2017   AST 17 03/09/2017   ALT 20 03/09/2017   PROT 7.4 03/09/2017   ALBUMIN 3.9 03/09/2017   CALCIUM 9.2 03/09/2017   GFR 94.61 03/09/2017   Lab Results  Component Value Date   CHOL 188 03/09/2017   Lab Results  Component Value Date   HDL 39.30 03/09/2017   Lab Results  Component Value Date   LDLCALC 117 (H) 03/09/2017   Lab Results  Component Value Date   TRIG 159.0 (H) 03/09/2017   Lab Results  Component Value Date   CHOLHDL 5 03/09/2017   Lab Results    Component Value Date   HGBA1C 6.1 03/09/2017       Assessment & Plan:   Problem List Items Addressed This Visit    UTI (urinary tract infection)    Urinalysis is unremarkable today      Hypothyroidism    On Levothyroxine, continue to monitor      Obesity    Encouraged DASH diet, decrease po intake and increase exercise as tolerated. Needs 7-8 hours of sleep nightly. Avoid trans fats, eat small, frequent meals every 4-5 hours with lean proteins, complex carbs and healthy fats. Minimize simple carbs      Low back pain - Primary    Encouraged moist heat and gentle stretching as tolerated. May try NSAIDs and prescription meds as directed and report if symptoms worsen or seek immediate care. Cyclobenzaprine prn. Xray unremarkable. Notify if pain does not improve      Relevant Medications   tiZANidine (ZANAFLEX) 2 MG tablet   Other Relevant Orders   POCT urinalysis dipstick (Completed)   DG Pelvis 1-2 Views (Completed)   Urine Culture (Completed)      I have changed Ms. Shampine's escitalopram. I am also having her start on tiZANidine. Additionally, I am having her maintain her cholecalciferol, cyclobenzaprine, triamcinolone cream, ranitidine, nystatin cream, Diclofenac Sodium, levothyroxine, Vitamin D (Ergocalciferol), meloxicam, gabapentin, ALPRAZolam, VENTOLIN HFA, and montelukast.  Meds ordered this encounter  Medications  . tiZANidine (ZANAFLEX) 2 MG tablet    Sig: Take 1 tablet (2 mg total) by mouth at bedtime as needed for muscle spasms.    Dispense:  30 tablet    Refill:  2  . escitalopram (LEXAPRO) 10 MG tablet    Sig: Take  1 tablet (10 mg total) by mouth daily.    Dispense:  90 tablet    Refill:  1  . montelukast (SINGULAIR) 10 MG tablet    Sig: Take 1 tablet (10 mg total) by mouth at bedtime.    Dispense:  90 tablet    Refill:  1    [name], MD

## 2017-08-26 NOTE — Assessment & Plan Note (Signed)
Urinalysis is unremarkable today

## 2017-09-06 MED FILL — ESCITALOPRAM 10 MG TABLET: 10 | 90 days supply | Qty: 90 | Fill #0

## 2017-09-17 ENCOUNTER — Encounter: Payer: Self-pay | Admitting: Family Medicine

## 2017-09-17 ENCOUNTER — Ambulatory Visit (INDEPENDENT_AMBULATORY_CARE_PROVIDER_SITE_OTHER): Payer: BLUE CROSS/BLUE SHIELD | Admitting: Family Medicine

## 2017-09-17 VITALS — Wt 242.6 lb

## 2017-09-17 DIAGNOSIS — R739 Hyperglycemia, unspecified: Secondary | ICD-10-CM | POA: Diagnosis not present

## 2017-09-17 DIAGNOSIS — Z79899 Other long term (current) drug therapy: Secondary | ICD-10-CM | POA: Diagnosis not present

## 2017-09-17 DIAGNOSIS — Z23 Encounter for immunization: Secondary | ICD-10-CM

## 2017-09-17 DIAGNOSIS — F419 Anxiety disorder, unspecified: Secondary | ICD-10-CM

## 2017-09-17 DIAGNOSIS — E039 Hypothyroidism, unspecified: Secondary | ICD-10-CM | POA: Diagnosis not present

## 2017-09-17 DIAGNOSIS — G43809 Other migraine, not intractable, without status migrainosus: Secondary | ICD-10-CM | POA: Diagnosis not present

## 2017-09-17 DIAGNOSIS — E782 Mixed hyperlipidemia: Secondary | ICD-10-CM | POA: Diagnosis not present

## 2017-09-17 DIAGNOSIS — Z0000 Encounter for general adult medical examination without abnormal findings: Secondary | ICD-10-CM | POA: Diagnosis not present

## 2017-09-17 MED ORDER — ALPRAZOLAM 0.25 MG PO TABS: 0.2500 mg | ORAL_TABLET | Freq: Two times a day (BID) | ORAL | 0 refills | Status: DC | PRN

## 2017-09-17 NOTE — Assessment & Plan Note (Signed)
Patient encouraged to maintain heart healthy diet, regular exercise, adequate sleep. Consider daily probiotics. Take medications as prescribed. Labs ordered  

## 2017-09-17 NOTE — Assessment & Plan Note (Signed)
Encouraged increased hydration, 64 ounces of clear fluids daily. Minimize alcohol and caffeine. Eat small frequent meals with lean proteins and complex carbs. Avoid high and low blood sugars. Get adequate sleep, 7-8 hours a night. Needs exercise daily preferably in the morning.  

## 2017-09-17 NOTE — Assessment & Plan Note (Signed)
encouraged heart healthy diet, avoid trans fats, minimize simple carbs and saturated fats. Increase exercise as tolerated 

## 2017-09-17 NOTE — Assessment & Plan Note (Signed)
On Levothyroxine, continue to monitor 

## 2017-09-17 NOTE — Assessment & Plan Note (Signed)
hgba1c acceptable, minimize simple carbs. Increase exercise as tolerated.  

## 2017-09-17 NOTE — Progress Notes (Signed)
Subjective:  I acted as a Neurosurgeon for Dr. Abner Greenspan. [name]  Patient ID: Alicia Cox, female    DOB: 09/23/1971, 46 y.o.   MRN: [id number]  No chief complaint on file.   HPI  Patient is in today for an annual exam and follow up on chronic medical conditions including hyperlipidemia, hypothyroidism and obesity. She feels well today. No recent febrile illness or hospitalizations. Is struggling with ongoing stress due to her husband and daughter's anxiety but her health is good. Is trying to eat well on a good day. Denies CP/palp/SOB/HA/congestion/fevers/GI or GU c/o. Taking meds as prescribed  Patient Care Team: Bradd Canary, MD as PCP - General (Family Medicine)   Past Medical History:  Diagnosis Date  . Acute bronchitis 11/03/2013  . Allergy    hay fever  . Arthritis   . Blood in stool   . Cervical cancer screening 09/14/2016   Menarche at 13 Regular and heavy flow no history of abnormal pap in past G2P2, s/p 2 svd No history of abnormal MGM No concerns today  gyn surgeries: tubal and endometrial ablation LMP 09/13/2016  . Chest pain 08/24/2013  . Chicken pox as a child  . Dermatitis 03/02/2016  . Frequent headaches   . GERD (gastroesophageal reflux disease)   . Heart murmur   . Hypothyroidism    Hashimoto's  . Knee pain, left 01/09/2014  . Low back pain 08/26/2017  . Migraine   . Obesity 11/15/2014  . Preventative health care 09/12/2015  . TOS (thoracic outlet syndrome) 08/25/2013  . UTI (urinary tract infection)   . Vitamin D deficiency     Past Surgical History:  Procedure Laterality Date  . APPENDECTOMY    . CESAREAN SECTION     twice  . CHOLECYSTECTOMY    . ENDOMETRIAL ABLATION    . TONSILLECTOMY    . TUBAL LIGATION  1999    Family History  Problem Relation Age of Onset  . Hyperlipidemia Mother   . Hypertension Mother   . Skin cancer Mother   . Diabetes Father        type 2  . Hypertension Sister   . Diabetes Sister   . Asthma Daughter   . Heart  disease Maternal Grandmother   . Heart attack Maternal Grandfather   . Diabetes Paternal Grandmother   . Diabetes Paternal Grandfather     Social History   Social History  . Marital status: Married    Spouse name: N/A  . Number of children: N/A  . Years of education: N/A   Occupational History  . Not on file.   Social History Main Topics  . Smoking status: Never Smoker  . Smokeless tobacco: Never Used     Comment: NEVER USED TOBACCO  . Alcohol use No  . Drug use: No  . 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 Not on file   Social History Narrative  . No narrative on file    Outpatient Medications Prior to Visit  Medication Sig Dispense Refill  . cholecalciferol (VITAMIN D) 1000 UNITS tablet Take 10,000 Units by mouth once a week.    . cyclobenzaprine (FLEXERIL) 10 MG tablet Take 1 tablet (10 mg total) by mouth at bedtime as needed for muscle spasms. 30 tablet 2  . Diclofenac Sodium (PENNSAID) 2 % SOLN Place 2 application  onto the skin 2 (two) times daily. 112 g 3  . escitalopram (LEXAPRO) 10 MG tablet Take 1 tablet (10 mg total) by mouth daily. 90 tablet 1  . gabapentin (NEURONTIN) 100 MG capsule Take 2 capsules (200 mg total) by mouth at bedtime. 60 capsule 3  . levothyroxine (SYNTHROID, LEVOTHROID) 100 MCG tablet TAKE 1 TABLET BY MOUTH DAILY EXCEPT TAKE 1.5 TABLETS ON THURSDAYS AND SUNDAYS 105 tablet 3  . meloxicam (MOBIC) 15 MG tablet Take 1 tablet (15 mg total) by mouth daily. 30 tablet 0  . montelukast (SINGULAIR) 10 MG tablet Take 1 tablet (10 mg total) by mouth at bedtime. 90 tablet 1  . nystatin cream (MYCOSTATIN) Apply 1 application topically 2 (two) times daily. 30 g 1  . ranitidine (ZANTAC) 300 MG tablet Take 1 tablet (300 mg total) by mouth at bedtime. 30 tablet 6  . tiZANidine (ZANAFLEX) 2 MG tablet Take 1 tablet (2 mg total) by mouth at bedtime as  needed for muscle spasms. 30 tablet 2  . triamcinolone cream (KENALOG) 0.1 % Apply 1 application topically 2 (two) times daily as needed. 30 g 0  . VENTOLIN HFA 108 (90 Base) MCG/ACT inhaler INHALE 2 PUFFS BY MOUTH INTO THE LUNGS EVERY 6 (SIX) HOURS AS NEEDED FOR WHEEZING OR SHORTNESS OF BREATH. 18 g 0  . Vitamin D, Ergocalciferol, (DRISDOL) 50000 units CAPS capsule Take 1 capsule (50,000 Units total) by mouth every 7 (seven) days. 12 capsule 0  . ALPRAZolam (XANAX) 0.25 MG tablet Take 1 tablet (0.25 mg total) by mouth 2 (two) times daily as needed for anxiety. 20 tablet 0   No facility-administered medications prior to visit.     No Known Allergies  Review of Systems  Constitutional: Positive for malaise/fatigue. Negative for fever.  HENT: Negative for congestion.   Eyes: Negative for blurred vision.  Respiratory: Negative for cough and shortness of breath.   Cardiovascular: Negative for chest pain, palpitations and leg swelling.  Gastrointestinal: Negative for vomiting.  Musculoskeletal: Negative for back pain.  Skin: Negative for rash.  Neurological: Negative for loss of consciousness and headaches.       Objective:    Physical Exam  Constitutional: She is oriented to person, place, and time. She appears well-developed and well-nourished. No distress.  HENT:  Head: Normocephalic and atraumatic.  Eyes: Conjunctivae are normal.  Neck: Normal range of motion. No thyromegaly present.  Cardiovascular: Normal rate and regular rhythm.   Pulmonary/Chest: Effort normal and breath sounds normal. She has no wheezes.  Abdominal: Soft. Bowel sounds are normal. There is no tenderness.  Musculoskeletal: Normal range of motion. She exhibits no edema or deformity.  Neurological: She is alert and oriented to person, place, and time.  Skin: Skin is warm and dry. She is not diaphoretic.  Psychiatric: She has a normal mood and affect.    Wt 242 lb 9.6 oz (110 kg)   BMI 41.64 kg/m  Wt  Readings from Last 3 Encounters:  09/17/17 242 lb 9.6 oz (110 kg)  08/23/17 245 lb 6.4 oz (111.3 kg)  03/28/17 265 lb (120.2 kg)   BP Readings from Last 3 Encounters:  08/23/17 122/78  03/28/17 112/86  03/09/17 116/73     Immunization History  Administered Date(s) Administered  . Influenza,inj,Quad PF,6+ Mos 10/21/2013, 08/14/2014, 09/02/2015, 09/14/2016, 09/17/2017  . Tdap 10/21/2013    Health Maintenance  Topic Date Due  . HIV Screening  03/04/1987  . MAMMOGRAM  10/24/2017  . PAP SMEAR  09/15/2019  .  TETANUS/TDAP  10/22/2023  . INFLUENZA VACCINE  Completed    Lab Results  Component Value Date   WBC 11.4 (H) 09/17/2017   HGB 14.7 09/17/2017   HCT 44.5 09/17/2017   PLT 380.0 09/17/2017   GLUCOSE 81 09/17/2017   CHOL 178 09/17/2017   TRIG 258.0 (H) 09/17/2017   HDL 38.10 (L) 09/17/2017   LDLDIRECT 104.0 09/17/2017   LDLCALC 117 (H) 03/09/2017   ALT 22 09/17/2017   AST 16 09/17/2017   NA 137 09/17/2017   K 4.1 09/17/2017   CL 102 09/17/2017   CREATININE 0.70 09/17/2017   BUN 13 09/17/2017   CO2 28 09/17/2017   TSH 1.65 09/17/2017   HGBA1C 5.6 09/17/2017    Lab Results  Component Value Date   TSH 1.65 09/17/2017   Lab Results  Component Value Date   WBC 11.4 (H) 09/17/2017   HGB 14.7 09/17/2017   HCT 44.5 09/17/2017   MCV 99.7 09/17/2017   PLT 380.0 09/17/2017   Lab Results  Component Value Date   NA 137 09/17/2017   K 4.1 09/17/2017   CO2 28 09/17/2017   GLUCOSE 81 09/17/2017   BUN 13 09/17/2017   CREATININE 0.70 09/17/2017   BILITOT 0.3 09/17/2017   ALKPHOS 52 09/17/2017   AST 16 09/17/2017   ALT 22 09/17/2017   PROT 7.7 09/17/2017   ALBUMIN 4.2 09/17/2017   CALCIUM 9.5 09/17/2017   GFR 95.94 09/17/2017   Lab Results  Component Value Date   CHOL 178 09/17/2017   Lab Results  Component Value Date   HDL 38.10 (L) 09/17/2017   Lab Results  Component Value Date   LDLCALC 117 (H) 03/09/2017   Lab Results  Component Value Date    TRIG 258.0 (H) 09/17/2017   Lab Results  Component Value Date   CHOLHDL 5 09/17/2017   Lab Results  Component Value Date   HGBA1C 5.6 09/17/2017         Assessment & Plan:   Problem List Items Addressed This Visit    Hypothyroidism    On Levothyroxine, continue to monitor      Relevant Orders   TSH (Completed)   Migraine    Encouraged increased hydration, 64 ounces of clear fluids daily. Minimize alcohol and caffeine. Eat small frequent meals with lean proteins and complex carbs. Avoid high and low blood sugars. Get adequate sleep, 7-8 hours a night. Needs exercise daily preferably in the morning.      Hyperglycemia    hgba1c acceptable, minimize simple carbs. Increase exercise as tolerated.      Relevant Orders   Hemoglobin A1c (Completed)   Comprehensive metabolic panel (Completed)   Hyperlipidemia, mixed    encouraged heart healthy diet, avoid trans fats, minimize simple carbs and saturated fats. Increase exercise as tolerated      Relevant Orders   Lipid panel (Completed)   Preventative health care    Patient encouraged to maintain heart healthy diet, regular exercise, adequate sleep. Consider daily probiotics. Take medications as prescribed. Labs ordered      Relevant Orders   CBC (Completed)    Other Visit Diagnoses    Needs flu shot    -  Primary   Relevant Orders   Flu Vaccine QUAD 6+ mos PF IM (Fluarix Quad PF) (Completed)   Anxiety       Relevant Medications   ALPRAZolam (XANAX) 0.25 MG tablet   High risk medication use       Relevant Orders  Pain Mgmt, Profile 8 w/Conf, U (Completed)      I am having Ms. Sherrill maintain her cholecalciferol, cyclobenzaprine, triamcinolone cream, ranitidine, nystatin cream, Diclofenac Sodium, levothyroxine, Vitamin D (Ergocalciferol), meloxicam, gabapentin, VENTOLIN HFA, tiZANidine, escitalopram, montelukast, and ALPRAZolam.  Meds ordered this encounter  Medications  . ALPRAZolam (XANAX) 0.25 MG tablet     Sig: Take 1 tablet (0.25 mg total) by mouth 2 (two) times daily as needed for anxiety.    Dispense:  20 tablet    Refill:  0    CMA served as scribe during this visit. History, Physical and Plan performed by medical provider. Documentation and orders reviewed and attested to.  [name], MD

## 2017-09-17 NOTE — Patient Instructions (Signed)
Preventive Care 18-39 Years, Female Preventive care refers to lifestyle choices and visits with your health care provider that can promote health and wellness. What does preventive care include?  A yearly physical exam. This is also called an annual well check.  Dental exams once or twice a year.  Routine eye exams. Ask your health care provider how often you should have your eyes checked.  Personal lifestyle choices, including: ? Daily care of your teeth and gums. ? Regular physical activity. ? Eating a healthy diet. ? Avoiding tobacco and drug use. ? Limiting alcohol use. ? Practicing safe sex. ? Taking vitamin and mineral supplements as recommended by your health care provider. What happens during an annual well check? The services and screenings done by your health care provider during your annual well check will depend on your age, overall health, lifestyle risk factors, and family history of disease. Counseling Your health care provider may ask you questions about your:  Alcohol use.  Tobacco use.  Drug use.  Emotional well-being.  Home and relationship well-being.  Sexual activity.  Eating habits.  Work and work Statistician.  Method of birth control.  Menstrual cycle.  Pregnancy history.  Screening You may have the following tests or measurements:  Height, weight, and BMI.  Diabetes screening. This is done by checking your blood sugar (glucose) after you have not eaten for a while (fasting).  Blood pressure.  Lipid and cholesterol levels. These may be checked every 5 years starting at age 66.  Skin check.  Hepatitis C blood test.  Hepatitis B blood test.  Sexually transmitted disease (STD) testing.  BRCA-related cancer screening. This may be done if you have a family history of breast, ovarian, tubal, or peritoneal cancers.  Pelvic exam and Pap test. This may be done every 3 years starting at age 40. Starting at age 59, this may be done every 5  years if you have a Pap test in combination with an HPV test.  Discuss your test results, treatment options, and if necessary, the need for more tests with your health care provider. Vaccines Your health care provider may recommend certain vaccines, such as:  Influenza vaccine. This is recommended every year.  Tetanus, diphtheria, and acellular pertussis (Tdap, Td) vaccine. You may need a Td booster every 10 years.  Varicella vaccine. You may need this if you have not been vaccinated.  HPV vaccine. If you are 69 or younger, you may need three doses over 6 months.  Measles, mumps, and rubella (MMR) vaccine. You may need at least one dose of MMR. You may also need a second dose.  Pneumococcal 13-valent conjugate (PCV13) vaccine. You may need this if you have certain conditions and were not previously vaccinated.  Pneumococcal polysaccharide (PPSV23) vaccine. You may need one or two doses if you smoke cigarettes or if you have certain conditions.  Meningococcal vaccine. One dose is recommended if you are age 27-21 years and a first-year college student living in a residence hall, or if you have one of several medical conditions. You may also need additional booster doses.  Hepatitis A vaccine. You may need this if you have certain conditions or if you travel or work in places where you may be exposed to hepatitis A.  Hepatitis B vaccine. You may need this if you have certain conditions or if you travel or work in places where you may be exposed to hepatitis B.  Haemophilus influenzae type b (Hib) vaccine. You may need this if  you have certain risk factors.  Talk to your health care provider about which screenings and vaccines you need and how often you need them. This information is not intended to replace advice given to you by your health care provider. Make sure you discuss any questions you have with your health care provider. Document Released: 01/30/2002 Document Revised: 08/23/2016  Document Reviewed: 10/05/2015 Elsevier Interactive Patient Education  2017 [employer].

## 2017-09-18 LAB — CBC
HEMATOCRIT: 44.5 % (ref 36.0–46.0)
HEMOGLOBIN: 14.7 g/dL (ref 12.0–15.0)
MCHC: 33.1 g/dL (ref 30.0–36.0)
MCV: 99.7 fl (ref 78.0–100.0)
Platelets: 380 10*3/uL (ref 150.0–400.0)
RBC: 4.46 Mil/uL (ref 3.87–5.11)
RDW: 12.5 % (ref 11.5–15.5)
WBC: 11.4 10*3/uL — ABNORMAL HIGH (ref 4.0–10.5)

## 2017-09-18 LAB — LIPID PANEL
CHOL/HDL RATIO: 5
CHOLESTEROL: 178 mg/dL (ref 0–200)
HDL: 38.1 mg/dL — AB (ref >39.00)
NonHDL: 139.57
Triglycerides: 258 mg/dL — ABNORMAL HIGH (ref 0.0–149.0)
VLDL: 51.6 mg/dL — AB (ref 0.0–40.0)

## 2017-09-18 LAB — COMPREHENSIVE METABOLIC PANEL
ALT: 22 U/L (ref 0–35)
AST: 16 U/L (ref 0–37)
Albumin: 4.2 g/dL (ref 3.5–5.2)
Alkaline Phosphatase: 52 U/L (ref 39–117)
BUN: 13 mg/dL (ref 6–23)
CO2: 28 meq/L (ref 19–32)
Calcium: 9.5 mg/dL (ref 8.4–10.5)
Chloride: 102 mEq/L (ref 96–112)
Creatinine, Ser: 0.7 mg/dL (ref 0.40–1.20)
GFR: 95.94 mL/min (ref >60.00)
GLUCOSE: 81 mg/dL (ref 70–99)
POTASSIUM: 4.1 meq/L (ref 3.5–5.1)
SODIUM: 137 meq/L (ref 135–145)
Total Bilirubin: 0.3 mg/dL (ref 0.2–1.2)
Total Protein: 7.7 g/dL (ref 6.0–8.3)

## 2017-09-18 LAB — TSH: TSH: 1.65 u[IU]/mL (ref 0.35–4.50)

## 2017-09-18 LAB — HEMOGLOBIN A1C: HEMOGLOBIN A1C: 5.6 % (ref 4.6–6.5)

## 2017-09-18 LAB — LDL CHOLESTEROL, DIRECT: Direct LDL: 104 mg/dL

## 2017-09-19 ENCOUNTER — Encounter: Payer: Self-pay | Admitting: Family Medicine

## 2017-09-19 LAB — PAIN MGMT, PROFILE 8 W/CONF, U
6 ACETYLMORPHINE: NEGATIVE ng/mL (ref <10)
ALCOHOL METABOLITES: NEGATIVE ng/mL (ref <500)
Amphetamines: NEGATIVE ng/mL (ref <500)
BUPRENORPHINE, URINE: NEGATIVE ng/mL (ref <5)
Benzodiazepines: NEGATIVE ng/mL (ref <100)
CREATININE: 79.8 mg/dL
Cocaine Metabolite: NEGATIVE ng/mL (ref <150)
MDMA: NEGATIVE ng/mL (ref <500)
Marijuana Metabolite: NEGATIVE ng/mL (ref <20)
OPIATES: NEGATIVE ng/mL (ref <100)
Oxidant: NEGATIVE ug/mL (ref <200)
Oxycodone: NEGATIVE ng/mL (ref <100)
PH: 5.08 (ref 4.5–9.0)

## 2017-10-24 ENCOUNTER — Ambulatory Visit: Payer: BLUE CROSS/BLUE SHIELD | Admitting: Family Medicine

## 2017-10-24 ENCOUNTER — Encounter: Payer: Self-pay | Admitting: Family Medicine

## 2017-10-24 DIAGNOSIS — M1712 Unilateral primary osteoarthritis, left knee: Secondary | ICD-10-CM | POA: Diagnosis not present

## 2017-10-24 NOTE — Progress Notes (Signed)
Tawana ScaleZach Smith D.O. [facility] [address] [place], [place] [address] Phone: [phone] Subjective:    I'm seeing this patient by the request  of:    CC:   QMV:[id number]:Subjective  Alicia Cox is a 46 y.o. female coming in for left knee pain. She has been seen by us before and states that her knee is acting. Her pain is worse with cold weather but has not determined a pattern to her pain. Pain character is achy.  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  Wants to continue with conservative therapy.  Patient will be going to [place] and will.  Concerned that her knee will not be able to do it.    Past Medical History:  Diagnosis Date  . Acute bronchitis 11/03/2013  . Allergy    hay fever  . Arthritis   . Blood in stool   . Cervical cancer screening 09/14/2016   Menarche at 13 Regular and heavy flow no history of abnormal pap in past G2P2, s/p 2 svd No history of abnormal MGM No concerns today  gyn surgeries: tubal and endometrial ablation LMP 09/13/2016  . Chest pain 08/24/2013  . Chicken pox as a child  . Dermatitis 03/02/2016  . Frequent headaches   . GERD (gastroesophageal reflux disease)   . Heart murmur   . Hypothyroidism    Hashimoto's  . Knee pain, left 01/09/2014  . Low back pain 08/26/2017  . Migraine   . Obesity 11/15/2014  . Preventative health care 09/12/2015  . TOS (thoracic outlet syndrome) 08/25/2013  . UTI (urinary tract infection)   . Vitamin D deficiency    Past Surgical History:  Procedure Laterality Date  . APPENDECTOMY    . CESAREAN SECTION     twice  . CHOLECYSTECTOMY    . ENDOMETRIAL ABLATION    . TONSILLECTOMY    . TUBAL LIGATION  1999   Social History   Socioeconomic History  . Marital status: Married    Spouse name: None  . Number of children: None  . Years of education: None  . Highest education level: None  Social Needs  . Financial resource strain: None  . Food insecurity - worry: None  . Food insecurity - inability: None  .  Transportation needs - medical: None  . Transportation needs - non-medical: None  Occupational History  . None  Tobacco Use  . Smoking status: Never Smoker  . Smokeless tobacco: Never Used  . Tobacco comment: NEVER USED TOBACCO  Substance and Sexual Activity  . Alcohol use: No    Alcohol/week: 0.0 oz  . Drug use: No  . 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 None  Social History Narrative  . None   No Known Allergies Family History  Problem Relation Age of Onset  . Hyperlipidemia Mother   . Hypertension Mother   . Skin cancer Mother   . Diabetes Father        type 2  . Hypertension Sister   . Diabetes Sister   . Asthma Daughter   . Heart disease Maternal Grandmother   . Heart attack Maternal Grandfather   . Diabetes Paternal Grandmother   . Diabetes Paternal Grandfather      Past medical history, social, surgical and family history all reviewed in electronic medical record.  No pertanent information unless stated regarding to  the chief complaint.   Review of Systems:Review of systems updated and as accurate as of 10/24/17  No headache, visual changes, nausea, vomiting, diarrhea, constipation, dizziness, abdominal pain, skin rash, fevers, chills, night sweats, weight loss, swollen lymph nodes, body aches, joint swelling, chest pain, shortness of breath, mood changes.  Positive muscle aches   Objective  Blood pressure 128/90, pulse 84, height 5\' 4"  (1.626 m), weight 242 lb (109.8 kg), SpO2 95 %. Systems examined below as of 10/24/17   General: No apparent distress alert and oriented x3 mood and affect normal, dressed appropriately.  HEENT: Pupils equal, extraocular movements intact  Respiratory: Patient's speak in full sentences and does not appear short of breath  Cardiovascular: No lower extremity edema, non tender, no erythema  Skin: Warm dry intact with no  signs of infection or rash on extremities or on axial skeleton.  Abdomen: Soft nontender  Neuro: Cranial nerves II through XII are intact, neurovascularly intact in all extremities with 2+ DTRs and 2+ pulses.  Lymph: No lymphadenopathy of posterior or anterior cervical chain or axillae bilaterally.  Gait normal with good balance and coordination.  MSK:  Non tender with full range of motion and good stability and symmetric strength and tone of shoulders, elbows, wrist, hip, and ankles bilaterally.  Knee: Left valgus deformity noted.  Abnormal thigh to calf ratio.  Tender to palpation over medial and PF joint line.  ROM full in flexion and extension and lower leg rotation. instability with valgus force.  painful patellar compression.  Mild Patellar glide with mild crepitus. Patellar and quadriceps tendons unremarkable. Hamstring and quadriceps strength is normal. Contralateral knee shows minimal arthritic changes  After informed written and verbal consent, patient was seated on exam table. Left knee was prepped with alcohol swab and utilizing anterolateral approach, patient's left knee space was injected with 4:1  marcaine 0.5%: Kenalog 40mg /dL. Patient tolerated the procedure well without immediate complications.   Impression and Recommendations:     This case required medical decision making of moderate complexity.      Note: This dictation was prepared with Dragon dictation along with smaller phrase technology. Any transcriptional errors that result from this process are unintentional.

## 2017-10-24 NOTE — Assessment & Plan Note (Signed)
Worsening symptoms.  Injection given today.  Declined physical therapy.  Encourage more home exercise.  Declined custom bracing, discussed which activities are doing which wants to avoid.  Topical anti-inflammatories given.  Follow-up again in 4 weeks

## 2017-10-24 NOTE — Patient Instructions (Signed)
Good to see you  Alicia Cox is your friend.  You know the drill  Watch the shoulder  Make an appointment in 1-2 months just in case but if doing well then see me when  You need me

## 2017-11-26 MED FILL — ESCITALOPRAM 10 MG TABLET: 10 | 90 days supply | Qty: 90 | Fill #1

## 2017-12-17 MED FILL — MONTELUKAST SOD 10 MG TAB: 10 | 90 days supply | Qty: 90 | Fill #1

## 2018-01-02 NOTE — Progress Notes (Signed)
[name] [address] [place], [place] [address] Phone: [phone] Subjective:    I'm seeing this patient by the request  of:    CC: Bilateral shoulder pain  [id number]  Alicia Cox is a 47 y.o. female coming in for bilateral shoulder pain. The left is worse than the right and she has had pain for about 1 month. Denies any radiating symptoms. Overhead motions bother her the most. She has had to use a muscle relaxer before bed. She also did restart the gabapentin.  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  Has had workup in the neck previously including x-rays that were independently visualized by me showing multilevel mild osteoarthritic changes Patient states that the pain is waking her up at night, worse at night, no numbness radiating down.  More just a dull aching sensation in the shoulders bilaterally    Past Medical History:  Diagnosis Date  . Acute bronchitis 11/03/2013  . Allergy    hay fever  . Arthritis   . Blood in stool   . Cervical cancer screening 09/14/2016   Menarche at 13 Regular and heavy flow no history of abnormal pap in past G2P2, s/p 2 svd No history of abnormal MGM No concerns today  gyn surgeries: tubal and endometrial ablation LMP 09/13/2016  . Chest pain 08/24/2013  . Chicken pox as a child  . Dermatitis 03/02/2016  . Frequent headaches   . GERD (gastroesophageal reflux disease)   . Heart murmur   . Hypothyroidism    Hashimoto's  . Knee pain, left 01/09/2014  . Low back pain 08/26/2017  . Migraine   . Obesity 11/15/2014  . Preventative health care 09/12/2015  . TOS (thoracic outlet syndrome) 08/25/2013  . UTI (urinary tract infection)   . Vitamin D deficiency    Past Surgical History:  Procedure Laterality Date  . APPENDECTOMY    . CESAREAN SECTION     twice  . CHOLECYSTECTOMY    . ENDOMETRIAL ABLATION    . TONSILLECTOMY    . TUBAL  LIGATION  1999   Social History   Socioeconomic History  . Marital status: Married    Spouse name: Not on file  . Number of children: Not on file  . Years of education: Not on file  . Highest education level: Not on file  Social Needs  . Financial resource strain: Not on file  . Food insecurity - worry: Not on file  . Food insecurity - inability: Not on file  . Transportation needs - medical: Not on file  . Transportation needs - non-medical: Not on file  Occupational History  . Not on file  Tobacco Use  . Smoking status: Never Smoker  . Smokeless tobacco: Never Used  . Tobacco comment: NEVER USED TOBACCO  Substance and Sexual Activity  . Alcohol use: No    Alcohol/week: 0.0 oz  . Drug use: No  . 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 Not on file  Social History Narrative  . Not on file   No Known Allergies Family History  Problem Relation Age of Onset  . Hyperlipidemia Mother   . Hypertension Mother   . Skin cancer Mother   . Diabetes Father        type  2  . Hypertension Sister   . Diabetes Sister   . Asthma Daughter   . Heart disease Maternal Grandmother   . Heart attack Maternal Grandfather   . Diabetes Paternal Grandmother   . Diabetes Paternal Grandfather      Past medical history, social, surgical and family history all reviewed in electronic medical record.  No pertanent information unless stated regarding to the chief complaint.   Review of Systems:Review of systems updated and as accurate as of 01/02/18  No headache, visual changes, nausea, vomiting, diarrhea, constipation, dizziness, abdominal pain, skin rash, fevers, chills, night sweats, weight loss, swollen lymph nodes, body aches, joint swelling, muscle aches, chest pain, shortness of breath, mood changes.   Objective  There were no vitals taken for this visit. Systems examined below  as of 01/02/18   General: No apparent distress alert and oriented x3 mood and affect normal, dressed appropriately.  HEENT: Pupils equal, extraocular movements intact  Respiratory: Patient's speak in full sentences and does not appear short of breath  Cardiovascular: No lower extremity edema, non tender, no erythema  Skin: Warm dry intact with no signs of infection or rash on extremities or on axial skeleton.  Abdomen: Soft nontender  Neuro: Cranial nerves II through XII are intact, neurovascularly intact in all extremities with 2+ DTRs and 2+ pulses.  Lymph: No lymphadenopathy of posterior or anterior cervical chain or axillae bilaterally.  Gait normal with good balance and coordination.  MSK:  Non tender with full range of motion and good stability and symmetric strength and tone of  elbows, wrist, hip, knee and ankles bilaterally.  Shoulder: Bilateral Inspection reveals no abnormalities, atrophy or asymmetry. Palpation is normal with no tenderness over AC joint or bicipital groove. ROM is full in all planes. Rotator cuff strength normal throughout. Severe impingement with Neer and Hawkins Speeds and Yergason's tests positive on right only. No labral pathology noted with negative Obrien's, negative clunk and good stability. Mild weakness of the scapula bilaterally No painful arc and no drop arm sign. No apprehension sign  Procedure: Real-time Ultrasound Guided Injection of right glenohumeral joint Device: GE Logiq Q7  Ultrasound guided injection is preferred based studies that show increased duration, increased effect, greater accuracy, decreased procedural pain, increased response rate with ultrasound guided versus blind injection.  Verbal informed consent obtained.  Time-out conducted.  Noted no overlying erythema, induration, or other signs of local infection.  Skin prepped in a sterile fashion.  Local anesthesia: Topical Ethyl chloride.  With sterile technique and under real  time ultrasound guidance:  Joint visualized.  23g 1  inch needle inserted posterior approach. Pictures taken for needle placement. Patient did have injection of 2 cc of 1% lidocaine, 2 cc of 0.5% Marcaine, and 1.0 cc of Kenalog 40 mg/dL. Completed without difficulty  Pain immediately resolved suggesting accurate placement of the medication.  Advised to call if fevers/chills, erythema, induration, drainage, or persistent bleeding.  Images permanently stored and available for review in the ultrasound unit.  Impression: Technically successful ultrasound guided injection.  Procedure: Real-time Ultrasound Guided Injection of left glenohumeral joint Device: GE Logiq E  Ultrasound guided injection is preferred based studies that show increased duration, increased effect, greater accuracy, decreased procedural pain, increased response rate with ultrasound guided versus blind injection.  Verbal informed consent obtained.  Time-out conducted.  Noted no overlying erythema, induration, or other signs of local infection.  Skin prepped in a sterile fashion.  Local anesthesia: Topical Ethyl chloride.  With sterile  technique and under real time ultrasound guidance:  Joint visualized.  21g 2 inch needle inserted posterior approach. Pictures taken for needle placement. Patient did have injection of 2 cc of 0.5% Marcaine, and 1cc of Kenalog 40 mg/dL. Completed without difficulty  Pain immediately resolved suggesting accurate placement of the medication.  Advised to call if fevers/chills, erythema, induration, drainage, or persistent bleeding.  Images permanently stored and available for review in the ultrasound unit.  Impression: Technically successful ultrasound guided injection.    Impression and Recommendations:     This case required medical decision making of moderate complexity.      Note: This dictation was prepared with Dragon dictation along with smaller phrase technology. Any transcriptional  errors that result from this process are unintentional.

## 2018-01-03 ENCOUNTER — Encounter: Payer: Self-pay | Admitting: Family Medicine

## 2018-01-03 ENCOUNTER — Ambulatory Visit: Payer: Self-pay

## 2018-01-03 ENCOUNTER — Ambulatory Visit: Payer: BLUE CROSS/BLUE SHIELD | Admitting: Family Medicine

## 2018-01-03 VITALS — BP 128/82 | HR 76 | Ht 64.0 in | Wt 242.0 lb

## 2018-01-03 DIAGNOSIS — M7550 Bursitis of unspecified shoulder: Secondary | ICD-10-CM

## 2018-01-03 DIAGNOSIS — G8929 Other chronic pain: Secondary | ICD-10-CM

## 2018-01-03 DIAGNOSIS — M25512 Pain in left shoulder: Secondary | ICD-10-CM

## 2018-01-03 DIAGNOSIS — M7551 Bursitis of right shoulder: Secondary | ICD-10-CM

## 2018-01-03 DIAGNOSIS — M7552 Bursitis of left shoulder: Secondary | ICD-10-CM

## 2018-01-03 NOTE — Assessment & Plan Note (Signed)
Patient has had difficulty previously.  Thoracic outlet syndrome has been within the differential.  Has responded though to shoulder injections in the past for shoulder subacromial bursitis.  Bilateral injections given today.  Encouraged home exercises regularly.  Gabapentin at night instead of cervical radiculopathy from possible thoracic outlet syndrome.  Follow-up again in 4 weeks

## 2018-01-03 NOTE — Patient Instructions (Signed)
Good to see you  Alicia Cox is your friend.  Stay active Exercises 3 times a week.  Gabapentin 100-200mg  at night Keep hands within peripheral vision.  See me again in 4 weeks.

## 2018-01-14 ENCOUNTER — Telehealth: Payer: Self-pay | Admitting: Family Medicine

## 2018-01-14 ENCOUNTER — Encounter: Payer: Self-pay | Admitting: Medical

## 2018-01-14 ENCOUNTER — Ambulatory Visit: Payer: BLUE CROSS/BLUE SHIELD | Admitting: Medical

## 2018-01-14 ENCOUNTER — Telehealth: Payer: Self-pay

## 2018-01-14 ENCOUNTER — Ambulatory Visit (HOSPITAL_BASED_OUTPATIENT_CLINIC_OR_DEPARTMENT_OTHER)
Admission: RE | Admit: 2018-01-14 | Discharge: 2018-01-14 | Disposition: A | Discharge status: Home / Self Care | Payer: BLUE CROSS/BLUE SHIELD | Source: Ambulatory Visit | Attending: Medical | Admitting: Medical

## 2018-01-14 VITALS — Temp 98.0°F | Resp 16 | Ht 64.0 in | Wt 234.4 lb

## 2018-01-14 DIAGNOSIS — I82441 Acute embolism and thrombosis of right tibial vein: Secondary | ICD-10-CM | POA: Diagnosis not present

## 2018-01-14 DIAGNOSIS — M79661 Pain in right lower leg: Secondary | ICD-10-CM

## 2018-01-14 DIAGNOSIS — M79604 Pain in right leg: Secondary | ICD-10-CM | POA: Diagnosis present

## 2018-01-14 MED ORDER — RIVAROXABAN 20 MG PO TABS: 20.0000 mg | ORAL_TABLET | Freq: Every day | ORAL | 3 refills | Status: DC

## 2018-01-14 MED ORDER — RIVAROXABAN 15 MG PO TABS: 15.0000 mg | ORAL_TABLET | Freq: Two times a day (BID) | ORAL | 0 refills | Status: DC

## 2018-01-14 MED ORDER — VITAMIN D (ERGOCALCIFEROL) 1.25 MG (50000 UNIT) PO CAPS: 50000.0000 [IU] | ORAL_CAPSULE | ORAL | 0 refills | Status: DC

## 2018-01-14 MED ORDER — DICLOFENAC SODIUM 75 MG PO TBEC: 75.0000 mg | DELAYED_RELEASE_TABLET | Freq: Two times a day (BID) | ORAL | 0 refills | Status: DC

## 2018-01-14 MED ORDER — RIVAROXABAN (XARELTO) VTE STARTER PACK (15 & 20 MG): ORAL_TABLET | ORAL | 0 refills | Status: DC

## 2018-01-14 MED FILL — DICLOFENAC SODIUM 75 MG TAB: 75 | 10 days supply | Qty: 20 | Fill #0

## 2018-01-14 NOTE — Telephone Encounter (Signed)
I sent a message to Dr. Abner GreenspanBlyth and she has reviewed the ultrasound.  She agrees with patient using Xarelto.   [name] will call and advise pt that Dr. Abner GreenspanBlyth agrees and reviewed US.

## 2018-01-14 NOTE — Telephone Encounter (Signed)
Spoke w/ Pam in [facility]- unable to get script to accept- we will try to change to the Xarelto 15/20mg  starter pack to see if it makes a difference.

## 2018-01-14 NOTE — Addendum Note (Signed)
Addended byConrad Homer: Tyarra Nolton D on: 01/14/2018 04:33 PM   Modules accepted: Orders

## 2018-01-14 NOTE — Telephone Encounter (Signed)
Elmarie [facility]ileyiffany [facility] and states neither Xarelto 20mg  or 15mg  needs prior authorization.  CB# [phone]

## 2018-01-14 NOTE — Patient Instructions (Addendum)
Your pain in the right lower calf seems to be in the region where her Achilles tendon connects to muscles in the calf.  Some pain on range of motion in this area as well.  I am prescribing diclofenac for pain and inflammation.  Can use ice to the area twice daily over the next 24-48 hours then you might get more benefit from warm compresses.  If the pain is lingering/not improving over the next 7-10 days then would consider referral to physical therapy or sports medicine.  Note I am currently favoring tendinitis or muscle strain diagnosis.  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  I did call downstairs and that has been arranged.  Can get the test done and we will call you later with the results.  If by chance it is positive then we will need to start you on Xarelto.  Follow-up in 7-10 days or as needed.  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 In the absence of prior examinations, an acute on chronic process is not excluded. There is no extension of this distal calf DVT to the more proximal venous system of the right lower extremity.  So I am going to put her on Xarelto sample tablets and place  samples up front to use 15 mg twice daily #21 tabs(Will give another 21 tab rx of 15 mg and send in 20 daily rx as well.Also gave xarelto discount card.  I am going to pass a message to Dr. Abner GreenspanBlyth and Dr. Myna HidalgoEnnever.  Since it appears with a second DVT and unknown precipitating event along with negative blood workup in the past that she might need to be on this preventatively in the future.  Asked Staff to explain stop diclofenac since dvt + and on xarelto.

## 2018-01-14 NOTE — Telephone Encounter (Signed)
PA initiated via Covermymeds; KEY: [id number]. Awaiting determination.

## 2018-01-14 NOTE — Telephone Encounter (Signed)
Patient' husband called to make an appointment today with Dr. Abner GreenspanBlyth about a second opinion for the patient having a blood clot in her leg, he hung the phone up before the [employer] agent could transfer to the [place], verified the patient was seen today by Esperanza RichtersEdward Saguier, PA-C and there was a US Venous was ordered and completed.

## 2018-01-14 NOTE — Progress Notes (Addendum)
Subjective:    Patient ID: Alicia Cox, female    DOB: April 15, 1971, 47 y.o.   MRN: [id number]  HPI  Pt in with some rt calf pain. Started on Friday evening. Pt just noticed pain come on slowly.  Pt has iced the area and put heat on area.  No fall or trauma. No recent exercise. No long trips or travel recently. Pt does not smoke. No ocp.  Pt had left side dvt in 2015. Pt was treated and sent to specialist to determine cause but none found.  Pt taking some low dose ibuprofen but not helping much.    Review of Systems  Constitutional: Negative for chills and fever.  Respiratory: Negative for cough, chest tightness, shortness of breath and wheezing.   Cardiovascular: Negative for chest pain and palpitations.  Gastrointestinal: Negative for abdominal pain, constipation, nausea and vomiting.  Musculoskeletal: Negative for back pain, neck pain and neck stiffness.       Rt calf pain. See hpi.  Skin: Negative for rash.  Hematological: Negative for adenopathy. Does not bruise/bleed easily.  Psychiatric/Behavioral: Negative for behavioral problems, decreased concentration and dysphoric mood.   Past Medical History:  Diagnosis Date  . Acute bronchitis 11/03/2013  . Allergy    hay fever  . Arthritis   . Blood in stool   . Cervical cancer screening 09/14/2016   Menarche at 13 Regular and heavy flow no history of abnormal pap in past G2P2, s/p 2 svd No history of abnormal MGM No concerns today  gyn surgeries: tubal and endometrial ablation LMP 09/13/2016  . Chest pain 08/24/2013  . Chicken pox as a child  . Dermatitis 03/02/2016  . Frequent headaches   . GERD (gastroesophageal reflux disease)   . Heart murmur   . Hypothyroidism    Hashimoto's  . Knee pain, left 01/09/2014  . Low back pain 08/26/2017  . Migraine   . Obesity 11/15/2014  . Preventative health care 09/12/2015  . TOS (thoracic outlet syndrome) 08/25/2013  . UTI (urinary tract infection)   . Vitamin D deficiency        Social History   Socioeconomic History  . Marital status: Married    Spouse name: Not on file  . Number of children: Not on file  . Years of education: Not on file  . Highest education level: Not on file  Social Needs  . Financial resource strain: Not on file  . Food insecurity - worry: Not on file  . Food insecurity - inability: Not on file  . Transportation needs - medical: Not on file  . Transportation needs - non-medical: Not on file  Occupational History  . Not on file  Tobacco Use  . Smoking status: Never Smoker  . Smokeless tobacco: Never Used  . Tobacco comment: NEVER USED TOBACCO  Substance and Sexual Activity  . Alcohol use: No    Alcohol/week: 0.0 oz  . Drug use: No  . 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 Not on file  Social History Narrative  . Not on file    Past Surgical History:  Procedure Laterality Date  . APPENDECTOMY    . CESAREAN SECTION     twice  . CHOLECYSTECTOMY    . ENDOMETRIAL ABLATION    . TONSILLECTOMY    . TUBAL LIGATION  1999    Family History  Problem Relation Age of Onset  . Hyperlipidemia Mother   . Hypertension Mother   . Skin cancer Mother   . Diabetes Father        type 2  . Hypertension Sister   . Diabetes Sister   . Asthma Daughter   . Heart disease Maternal Grandmother   . Heart attack Maternal Grandfather   . Diabetes Paternal Grandmother   . Diabetes Paternal Grandfather     No Known Allergies  Current Outpatient Medications on File Prior to Visit  Medication Sig Dispense Refill  . ALPRAZolam (XANAX) 0.25 MG tablet Take 1 tablet (0.25 mg total) by mouth 2 (two) times daily as needed for anxiety. 20 tablet 0  . cholecalciferol (VITAMIN D) 1000 UNITS tablet Take 10,000 Units by mouth once a week.    . cyclobenzaprine (FLEXERIL) 10 MG tablet Take 1 tablet (10 mg total) by mouth at bedtime as needed  for muscle spasms. 30 tablet 2  . Diclofenac Sodium (PENNSAID) 2 % SOLN Place 2 application onto the skin 2 (two) times daily. 112 g 3  . escitalopram (LEXAPRO) 10 MG tablet Take 1 tablet (10 mg total) by mouth daily. 90 tablet 1  . gabapentin (NEURONTIN) 100 MG capsule Take 2 capsules (200 mg total) by mouth at bedtime. 60 capsule 3  . levothyroxine (SYNTHROID, LEVOTHROID) 100 MCG tablet TAKE 1 TABLET BY MOUTH DAILY EXCEPT TAKE 1.5 TABLETS ON THURSDAYS AND SUNDAYS 105 tablet 3  . meloxicam (MOBIC) 15 MG tablet Take 1 tablet (15 mg total) by mouth daily. 30 tablet 0  . montelukast (SINGULAIR) 10 MG tablet Take 1 tablet (10 mg total) by mouth at bedtime. 90 tablet 1  . nystatin cream (MYCOSTATIN) Apply 1 application topically 2 (two) times daily. 30 g 1  . ranitidine (ZANTAC) 300 MG tablet Take 1 tablet (300 mg total) by mouth at bedtime. 30 tablet 6  . tiZANidine (ZANAFLEX) 2 MG tablet Take 1 tablet (2 mg total) by mouth at bedtime as needed for muscle spasms. 30 tablet 2  . triamcinolone cream (KENALOG) 0.1 % Apply 1 application topically 2 (two) times daily as needed. 30 g 0  . VENTOLIN HFA 108 (90 Base) MCG/ACT inhaler INHALE 2 PUFFS BY MOUTH INTO THE LUNGS EVERY 6 (SIX) HOURS AS NEEDED FOR WHEEZING OR SHORTNESS OF BREATH. 18 g 0  . Vitamin D, Ergocalciferol, (DRISDOL) 50000 units CAPS capsule Take 1 capsule (50,000 Units total) by mouth every 7 (seven) days. 12 capsule 0   No current facility-administered medications on file prior to visit.     Temp 98 F (36.7 C) (Oral)   Resp 16   Ht 5\' 4"  (1.626 m)   Wt 234 lb 6.4 oz (106.3 kg)   SpO2 100%   BMI 40.23 kg/m       Objective:   Physical Exam  General- No acute distress. Pleasant patient. Neck- Full range of motion, no jvd Lungs- Clear, even and unlabored. Heart- regular rate and rhythm. Neurologic- CNII- XII grossly intact.  Rt lower ext- rt calf not swollen. No warmth, negative homans sign.  Pain more in the region where  Achilles tendon To calf muscles.  Some pain in this area on plantar flexion and dorsiflexion of the foot/ankle.  Lt leg- no obvious swelling on exam. Negative homans sign.       Assessment & Plan:  Your pain in the right lower calf seems to be in the region where her Achilles tendon connects to muscles in the  calf.  Some pain on range of motion in this area as well.  I am prescribing diclofenac for pain and inflammation.  Can use ice to the area twice daily over the next 24-48 hours then you might get more benefit from warm compresses.  If the pain is lingering/not improving over the next 7-10 days then would consider referral to physical therapy or sports medicine.  Note I am currently favoring tendinitis or muscle strain diagnosis.  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  I did call downstairs and that has been arranged.  Can get the test done and we will call you later with the results.  If by chance it is positive then we will need to start you on Xarelto.  Follow-up in 7-10 days or as needed.  So I am going to put her on Xarelto sample tablets and place  samples up front to use 15 mg twice daily #21 tabs(Will give another 21 tab rx of 15 mg and send in 20 daily rx as well.Also gave xarelto discount card.  I am going to pass a message to Dr. Abner Greenspan and Dr. Myna Hidalgo.  Since it appears with a second DVT and unknown precipitating event along with negative blood workup in the past that she might need to be on this preventatively in the future.

## 2018-01-14 NOTE — Addendum Note (Signed)
Addended by: Gwenevere AbbotSAGUIER, Peniel Hass M on: 01/14/2018 09:38 PM   Modules accepted: Orders

## 2018-01-15 ENCOUNTER — Other Ambulatory Visit: Payer: Self-pay | Admitting: Family Medicine

## 2018-01-15 DIAGNOSIS — Z86718 Personal history of other venous thrombosis and embolism: Secondary | ICD-10-CM

## 2018-01-15 NOTE — Telephone Encounter (Signed)
Spoke with patient she will go ahead with the medication.  And would like the ref to hematology

## 2018-01-23 MED FILL — XARELTO STARTER PACK: 15 & 20 | 30 days supply | Qty: 51 | Fill #0

## 2018-01-25 ENCOUNTER — Encounter: Payer: Self-pay | Admitting: Medical

## 2018-01-25 ENCOUNTER — Ambulatory Visit: Payer: BLUE CROSS/BLUE SHIELD | Admitting: Medical

## 2018-01-25 ENCOUNTER — Ambulatory Visit: Payer: Self-pay | Admitting: *Deleted

## 2018-01-25 ENCOUNTER — Ambulatory Visit (HOSPITAL_BASED_OUTPATIENT_CLINIC_OR_DEPARTMENT_OTHER)
Admission: RE | Admit: 2018-01-25 | Discharge: 2018-01-25 | Disposition: A | Discharge status: Home / Self Care | Payer: BLUE CROSS/BLUE SHIELD | Source: Ambulatory Visit | Attending: Medical | Admitting: Medical

## 2018-01-25 VITALS — BP 118/67 | HR 67 | Temp 98.4°F | Resp 16 | Ht 64.0 in | Wt 235.6 lb

## 2018-01-25 DIAGNOSIS — I82491 Acute embolism and thrombosis of other specified deep vein of right lower extremity: Secondary | ICD-10-CM

## 2018-01-25 DIAGNOSIS — M79651 Pain in right thigh: Secondary | ICD-10-CM | POA: Diagnosis not present

## 2018-01-25 DIAGNOSIS — I824Z1 Acute embolism and thrombosis of unspecified deep veins of right distal lower extremity: Secondary | ICD-10-CM | POA: Insufficient documentation

## 2018-01-25 NOTE — Telephone Encounter (Signed)
Pt's husband, Chrissie NoaWilliam, called because the pt was diagnosed with blood clots in her right calf and now the upper leg is swollen and pain; he also says that the pt has been on xarelto; nurse triage initiated and per protocol recommendation made for pt to see physician within 4 hours; pt's husband request appointment in office; he is offered and accepted [facility] at 09[facility]; he verbalizes understanding.  Reason for Disposition . [1] Thigh or calf pain AND [2] only 1 side AND [3] present > 1 hour  Answer Assessment - Initial Assessment Questions 1. ONSET: "When did the swelling start?" (e.g., minutes, hours, days)     01/24/18 2. LOCATION: "What part of the leg is swollen?"  "Are both legs swollen or just one leg?"     Right leg above the knew 3. SEVERITY: "How bad is the swelling?" (e.g., localized; mild, moderate, severe)  - Localized - small area of swelling localized to one leg  - MILD pedal edema - swelling limited to foot and ankle, pitting edema < 1/4 inch (6 mm) deep, rest and elevation eliminate most or all swelling  - MODERATE edema - swelling of lower leg to knee, pitting edema > 1/4 inch (6 mm) deep, rest and elevation only partially reduce swelling  - SEVERE edema - swelling extends above knee, facial or hand swelling present     mild 4. REDNESS: "Does the swelling look red or infected?"     no 5. PAIN: "Is the swelling painful to touch?" If so, ask: "How painful is it?"   (Scale 1-10; mild, moderate or severe)     mild 6. FEVER: "Do you have a fever?" If so, ask: "What is it, how was it measured, and when did it start?"      no 7. CAUSE: "What do you think is causing the leg swelling?"     ? Blood clot moved 8. MEDICAL HISTORY: "Do you have a history of heart failure, kidney disease, liver failure, or cancer?"     Blood clot present in right lower leg; takes xarelto 9. RECURRENT SYMPTOM: "Have you had leg swelling before?" If so, ask: "When was the  last time?" "What happened that time?"     unsure 10. OTHER SYMPTOMS: "Do you have any other symptoms?" (e.g., chest pain, difficulty breathing)       no 11. PREGNANCY: "Is there any chance you are pregnant?" "When was your last menstrual period?"       No last week  Protocols used: LEG SWELLING AND EDEMA-A-AH

## 2018-01-25 NOTE — Patient Instructions (Addendum)
  For rt upper thigh region pain and history of dvt  So will get repeat US of lower ext. Please continue xarelto. Point out to US tech area of new pain.  We will let you know results of study later today.  Follow up with us to be determined after US and as scheduled with Dr. Myna HidalgoEnnever  Discussed with Radiology and earliest slot 1:30. So scheduled you then

## 2018-01-25 NOTE — Progress Notes (Signed)
Subjective:    Patient ID: Alicia Cox, female    DOB: 01/13/1971, 47 y.o.   MRN: [id number]  HPI  Pt in for follow up. She has been taking medication xarelto since she was diagnosed as instructed. Pt husband had some hesitancy for her to start but  Dr. Abner Greenspan agreed for her to be on th medication.  Pt specifies she was never reluctant but her husband was. No adverse side effects. But she had some mild anterior thigh pain yesterday.  Pt states her rt calf pain mid aspect hurt up until Monday and Tuesday of this week. Pain has gradually tapered off.  Pt reports no chest pain or shortness of breath.  Pt has appointment with Dr. Myna Hidalgo on March 6,2019.   Review of Systems  Constitutional: Negative for chills, fatigue and fever.  Respiratory: Negative for cough, chest tightness, shortness of breath and wheezing.   Cardiovascular: Negative for chest pain and palpitations.  Gastrointestinal: Negative for abdominal pain, blood in stool, constipation, nausea and vomiting.  Musculoskeletal: Negative for back pain, myalgias and neck stiffness.       Rt thigh- faint tenderness medial mid aspect.  Skin: Negative for rash and wound.  Neurological: Negative for dizziness, tremors, seizures, weakness and headaches.  Hematological: Negative for adenopathy. Does not bruise/bleed easily.  Psychiatric/Behavioral: Negative for behavioral problems and confusion. The patient is not nervous/anxious.     Past Medical History:  Diagnosis Date  . Acute bronchitis 11/03/2013  . Allergy    hay fever  . Arthritis   . Blood in stool   . Cervical cancer screening 09/14/2016   Menarche at 13 Regular and heavy flow no history of abnormal pap in past G2P2, s/p 2 svd No history of abnormal MGM No concerns today  gyn surgeries: tubal and endometrial ablation LMP 09/13/2016  . Chest pain 08/24/2013  . Chicken pox as a child  . Dermatitis 03/02/2016  . Frequent headaches   . GERD (gastroesophageal reflux  disease)   . Heart murmur   . Hypothyroidism    Hashimoto's  . Knee pain, left 01/09/2014  . Low back pain 08/26/2017  . Migraine   . Obesity 11/15/2014  . Preventative health care 09/12/2015  . TOS (thoracic outlet syndrome) 08/25/2013  . UTI (urinary tract infection)   . Vitamin D deficiency      Social History   Socioeconomic History  . Marital status: Married    Spouse name: Not on file  . Number of children: Not on file  . Years of education: Not on file  . Highest education level: Not on file  Social Needs  . Financial resource strain: Not on file  . Food insecurity - worry: Not on file  . Food insecurity - inability: Not on file  . Transportation needs - medical: Not on file  . Transportation needs - non-medical: Not on file  Occupational History  . Not on file  Tobacco Use  . Smoking status: Never Smoker  . Smokeless tobacco: Never Used  . Tobacco comment: NEVER USED TOBACCO  Substance and Sexual Activity  . Alcohol use: No    Alcohol/week: 0.0 oz  . Drug use: No  . 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 Not on file  Social History Narrative  . Not on file    Past Surgical History:  Procedure Laterality  Date  . APPENDECTOMY    . CESAREAN SECTION     twice  . CHOLECYSTECTOMY    . ENDOMETRIAL ABLATION    . TONSILLECTOMY    . TUBAL LIGATION  1999    Family History  Problem Relation Age of Onset  . Hyperlipidemia Mother   . Hypertension Mother   . Skin cancer Mother   . Diabetes Father        type 2  . Hypertension Sister   . Diabetes Sister   . Asthma Daughter   . Heart disease Maternal Grandmother   . Heart attack Maternal Grandfather   . Diabetes Paternal Grandmother   . Diabetes Paternal Grandfather     No Known Allergies  Current Outpatient Medications on File Prior to Visit  Medication Sig Dispense Refill  . ALPRAZolam (XANAX)  0.25 MG tablet Take 1 tablet (0.25 mg total) by mouth 2 (two) times daily as needed for anxiety. 20 tablet 0  . cholecalciferol (VITAMIN D) 1000 UNITS tablet Take 10,000 Units by mouth once a week.    . cyclobenzaprine (FLEXERIL) 10 MG tablet Take 1 tablet (10 mg total) by mouth at bedtime as needed for muscle spasms. 30 tablet 2  . Diclofenac Sodium (PENNSAID) 2 % SOLN Place 2 application onto the skin 2 (two) times daily. 112 g 3  . escitalopram (LEXAPRO) 10 MG tablet Take 1 tablet (10 mg total) by mouth daily. 90 tablet 1  . gabapentin (NEURONTIN) 100 MG capsule Take 2 capsules (200 mg total) by mouth at bedtime. 60 capsule 3  . levothyroxine (SYNTHROID, LEVOTHROID) 100 MCG tablet TAKE 1 TABLET BY MOUTH DAILY EXCEPT TAKE 1.5 TABLETS ON THURSDAYS AND SUNDAYS 105 tablet 3  . montelukast (SINGULAIR) 10 MG tablet Take 1 tablet (10 mg total) by mouth at bedtime. 90 tablet 1  . nystatin cream (MYCOSTATIN) Apply 1 application topically 2 (two) times daily. 30 g 1  . ranitidine (ZANTAC) 300 MG tablet Take 1 tablet (300 mg total) by mouth at bedtime. 30 tablet 6  . Rivaroxaban (XARELTO STARTER PACK) 15 & 20 MG TBPK Take as directed on package: Start with one 15mg  tablet by mouth twice a day with food. On Day 22, switch to one 20mg  tablet once a day with food. 51 each 0  . tiZANidine (ZANAFLEX) 2 MG tablet Take 1 tablet (2 mg total) by mouth at bedtime as needed for muscle spasms. 30 tablet 2  . triamcinolone cream (KENALOG) 0.1 % Apply 1 application topically 2 (two) times daily as needed. 30 g 0  . VENTOLIN HFA 108 (90 Base) MCG/ACT inhaler INHALE 2 PUFFS BY MOUTH INTO THE LUNGS EVERY 6 (SIX) HOURS AS NEEDED FOR WHEEZING OR SHORTNESS OF BREATH. 18 g 0  . Vitamin D, Ergocalciferol, (DRISDOL) 50000 units CAPS capsule Take 1 capsule (50,000 Units total) by mouth every 7 (seven) days. 12 capsule 0   No current facility-administered medications on file prior to visit.     BP 118/67   Pulse 67   Temp 98.4  F (36.9 C) (Oral)   Resp 16   Ht 5\' 4"  (1.626 m)   Wt 235 lb 9.6 oz (106.9 kg)   SpO2 97%   BMI 40.44 kg/m       Objective:   Physical Exam  General- No acute distress. Pleasant patient. Neck- Full range of motion, no jvd Lungs- Clear, even and unlabored. Heart- regular rate and rhythm. Neurologic- CNII- XII grossly intact.  Rt lower ext-  calf area no swollen or tender presently. Negative homans signs.  Rt upper thigh- faint tenderness medial aspect. No redness or warmth. No dilated vein.      Assessment & Plan:  For rt upper thigh region pain and history of dvt  So will get repeat US of lower ext. Please continue xarelto. Point out to US tech area of new pain.  We will let you know results of study later today.  Follow up with us to be determined after US and as scheduled with Dr. Odette FractionEnnever  Arshdeep Bolger, PA-C

## 2018-02-05 NOTE — Progress Notes (Signed)
Alicia Cox D.O. [facility] [address] [place], [place] [address] Phone: [phone] Subjective:     CC: Left knee pain  NFA:[id number]:Subjective  Alicia Cox is a 47 y.o. female coming in with complaint of left knee pain. Has had a blood clot in right leg.  Patient continues on to have left knee pain.  Patient states that it is severe.  Patient is now on a blood thinner.  Feels like she has been compensating for the pain in the right knee for so long.  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  Patient is going to be following up with hematology in the near future.  Onset- 1 week after blood clot Location- Middle of the knee  Character- Sharp Aggravating factors- Stairs and hills Reliving factors-  Therapies tried- [facility] Severity-6 out of 10     Past Medical History:  Diagnosis Date  . Acute bronchitis 11/03/2013  . Allergy    hay fever  . Arthritis   . Blood in stool   . Cervical cancer screening 09/14/2016   Menarche at 13 Regular and heavy flow no history of abnormal pap in past G2P2, s/p 2 svd No history of abnormal MGM No concerns today  gyn surgeries: tubal and endometrial ablation LMP 09/13/2016  . Chest pain 08/24/2013  . Chicken pox as a child  . Dermatitis 03/02/2016  . Frequent headaches   . GERD (gastroesophageal reflux disease)   . Heart murmur   . Hypothyroidism    Hashimoto's  . Knee pain, left 01/09/2014  . Low back pain 08/26/2017  . Migraine   . Obesity 11/15/2014  . Preventative health care 09/12/2015  . TOS (thoracic outlet syndrome) 08/25/2013  . UTI (urinary tract infection)   . Vitamin D deficiency    Past Surgical History:  Procedure Laterality Date  . APPENDECTOMY    . CESAREAN SECTION     twice  . CHOLECYSTECTOMY    . ENDOMETRIAL ABLATION    . TONSILLECTOMY    . TUBAL LIGATION  1999   Social History   Socioeconomic History  . Marital status: Married    Spouse name: None  . Number of children: None  . Years of education: None   . Highest education level: None  Social Needs  . Financial resource strain: None  . Food insecurity - worry: None  . Food insecurity - inability: None  . Transportation needs - medical: None  . Transportation needs - non-medical: None  Occupational History  . None  Tobacco Use  . Smoking status: Never Smoker  . Smokeless tobacco: Never Used  . Tobacco comment: NEVER USED TOBACCO  Substance and Sexual Activity  . Alcohol use: No    Alcohol/week: 0.0 oz  . Drug use: No  . 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 None  Social History Narrative  . None   No Known Allergies Family History  Problem Relation Age of Onset  . Hyperlipidemia Mother   . Hypertension Mother   . Skin cancer Mother   . Diabetes Father        type 2  . Hypertension Sister   . Diabetes Sister   . Asthma Daughter   . Heart disease Maternal Grandmother   . Heart attack Maternal Grandfather   . Diabetes Paternal Grandmother   . Diabetes Paternal  Grandfather      Past medical history, social, surgical and family history all reviewed in electronic medical record.  No pertanent information unless stated regarding to the chief complaint.   Review of Systems:Review of systems updated and as accurate as of 02/06/18  No headache, visual changes, nausea, vomiting, diarrhea, constipation, dizziness, abdominal pain, skin rash, fevers, chills, night sweats, weight loss, swollen lymph nodes, body aches, joint swelling,  chest pain, shortness of breath, mood changes.  Positive muscle aches  Objective  Blood pressure 122/80, pulse 73, height 5\' 4"  (1.626 m), weight 236 lb (107 kg), SpO2 97 %. Systems examined below as of 02/06/18   General: No apparent distress alert and oriented x3 mood and affect normal, dressed appropriately.  HEENT: Pupils equal, extraocular movements intact  Respiratory: Patient's  speak in full sentences and does not appear short of breath  Cardiovascular: No lower extremity edema, non tender, no erythema  Skin: Warm dry intact with no signs of infection or rash on extremities or on axial skeleton.  Abdomen: Soft nontender  Neuro: Cranial nerves II through XII are intact, neurovascularly intact in all extremities with 2+ DTRs and 2+ pulses.  Lymph: No lymphadenopathy of posterior or anterior cervical chain or axillae bilaterally.  Gait antalgic gait MSK: Mild tender with full range of motion and good stability and symmetric strength and tone of shoulders, elbows, wrist, hip, and ankles bilaterally.   Knee: Left valgus deformity noted.  Abnormal thigh to calf ratio.  Tender to palpation over medial and PF joint line.  ROM full in flexion and extension and lower leg rotation. instability with valgus force.  painful patellar compression. Patellar glide with moderate crepitus. Patellar and quadriceps tendons unremarkable. Hamstring and quadriceps strength is normal. Contralateral knee shows right mild arthritic changes as well.  Still significant pain over the calf.  After informed written and verbal consent, patient was seated on exam table. Left knee was prepped with alcohol swab and utilizing anterolateral approach, patient's left knee space was injected with 4:1  marcaine 0.5%: Kenalog 40mg /dL. Patient tolerated the procedure well without immediate complications.     Impression and Recommendations:     This case required medical decision making of moderate complexity.      Note: This dictation was prepared with Dragon dictation along with smaller phrase technology. Any transcriptional errors that result from this process are unintentional.

## 2018-02-06 ENCOUNTER — Ambulatory Visit: Payer: BLUE CROSS/BLUE SHIELD | Admitting: Family Medicine

## 2018-02-06 ENCOUNTER — Encounter: Payer: Self-pay | Admitting: Family Medicine

## 2018-02-06 ENCOUNTER — Other Ambulatory Visit (INDEPENDENT_AMBULATORY_CARE_PROVIDER_SITE_OTHER): Payer: BLUE CROSS/BLUE SHIELD

## 2018-02-06 VITALS — BP 122/80 | HR 73 | Ht 64.0 in | Wt 236.0 lb

## 2018-02-06 DIAGNOSIS — M2550 Pain in unspecified joint: Secondary | ICD-10-CM | POA: Diagnosis not present

## 2018-02-06 DIAGNOSIS — M1712 Unilateral primary osteoarthritis, left knee: Secondary | ICD-10-CM | POA: Diagnosis not present

## 2018-02-06 LAB — IBC PANEL
Iron: 147 ug/dL — ABNORMAL HIGH (ref 42–145)
SATURATION RATIOS: 44.1 % (ref 20.0–50.0)
Transferrin: 238 mg/dL (ref 212.0–360.0)

## 2018-02-06 LAB — SEDIMENTATION RATE: Sed Rate: 5 mm/hr (ref 0–20)

## 2018-02-06 LAB — URIC ACID: Uric Acid, Serum: 5 mg/dL (ref 2.4–7.0)

## 2018-02-06 LAB — C-REACTIVE PROTEIN: CRP: 0.6 mg/dL (ref 0.5–20.0)

## 2018-02-06 NOTE — Assessment & Plan Note (Signed)
Patient given another injection today.  As for her age.  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  Rule out autoimmune disease.  Follow-up again 4 weeks.  Could be a candidate for Visco supplementation if needed.

## 2018-02-06 NOTE — Patient Instructions (Signed)
Good to see you  Injected the knee to calm it down  Ice is your friend.  Stay active.  We will get labs today as well  See me again in 4-6 weeks

## 2018-02-08 ENCOUNTER — Other Ambulatory Visit: Payer: Self-pay | Admitting: Family Medicine

## 2018-02-08 DIAGNOSIS — E039 Hypothyroidism, unspecified: Secondary | ICD-10-CM

## 2018-02-08 MED ORDER — LEVOTHYROXINE SODIUM 100 MCG PO TABS: ORAL_TABLET | ORAL | 3 refills | Status: DC

## 2018-02-08 NOTE — Telephone Encounter (Signed)
Copied from CRM [id number]. Topic: Quick Communication - Rx Refill/Question >> Feb 08, 2018  1:30 PM Alicia Cox, Alicia Cox, [place] wrote: Medication: synthroid   Has the patient contacted their pharmacy?yes   (Agent: If no, request that the patient contact the pharmacy for the refill.)   Preferred Pharmacy (with phone number or street name): [facility] [address] - [place], [place] - [address] [place] [address] Phone: [phone] Fax: [phone]     Agent: Please be advised that RX refills may take up to 3 business days. We ask that you follow-up with your pharmacy.

## 2018-02-10 LAB — VITAMIN D 1,25 DIHYDROXY
VITAMIN D 1, 25 (OH) TOTAL: 25 pg/mL (ref 18–72)
Vitamin D2 1, 25 (OH)2: <8 pg/mL
Vitamin D3 1, 25 (OH)2: 25 pg/mL

## 2018-02-10 LAB — PTH, INTACT AND CALCIUM
CALCIUM: 9.2 mg/dL (ref 8.6–10.2)
PTH: 40 pg/mL (ref 14–64)

## 2018-02-10 LAB — FACTOR 5 LEIDEN

## 2018-02-10 LAB — CALCIUM, IONIZED: Calcium, Ion: 5.2 mg/dL (ref 4.8–5.6)

## 2018-02-10 LAB — ANA: ANA: NEGATIVE

## 2018-02-10 LAB — CYCLIC CITRUL PEPTIDE ANTIBODY, IGG: Cyclic Citrullin Peptide Ab: <16 UNITS

## 2018-02-10 LAB — RHEUMATOID FACTOR: Rhuematoid fact SerPl-aCnc: <14 IU/mL (ref <14)

## 2018-02-11 ENCOUNTER — Encounter: Payer: Self-pay | Admitting: Family Medicine

## 2018-02-12 ENCOUNTER — Ambulatory Visit (HOSPITAL_BASED_OUTPATIENT_CLINIC_OR_DEPARTMENT_OTHER)
Admission: RE | Admit: 2018-02-12 | Discharge: 2018-02-12 | Disposition: A | Discharge status: Home / Self Care | Payer: BLUE CROSS/BLUE SHIELD | Source: Ambulatory Visit | Attending: Family Medicine | Admitting: Family Medicine

## 2018-02-12 ENCOUNTER — Ambulatory Visit: Payer: BLUE CROSS/BLUE SHIELD | Admitting: Family Medicine

## 2018-02-12 ENCOUNTER — Encounter: Payer: Self-pay | Admitting: Family Medicine

## 2018-02-12 VITALS — BP 128/78 | HR 68 | Temp 97.8°F | Resp 18 | Ht 64.0 in | Wt 237.0 lb

## 2018-02-12 DIAGNOSIS — M25571 Pain in right ankle and joints of right foot: Secondary | ICD-10-CM | POA: Insufficient documentation

## 2018-02-12 DIAGNOSIS — R739 Hyperglycemia, unspecified: Secondary | ICD-10-CM

## 2018-02-12 NOTE — Patient Instructions (Signed)
Ankle Sprain  An ankle sprain is a stretch or tear in one of the tough tissues (ligaments) in your ankle.  Follow these instructions at home:   Rest your ankle.   Take over-the-counter and prescription medicines only as told by your doctor.   For 2-3 days, keep your ankle higher than the level of your heart (elevated) as much as possible.   If directed, put ice on the area:  ? Put ice in a plastic bag.  ? Place a towel between your skin and the bag.  ? Leave the ice on for 20 minutes, 2-3 times a day.   If you were given a brace:  ? Wear it as told.  ? Take it off to shower or bathe.  ? Try not to move your ankle much, but wiggle your toes from time to time. This helps to prevent swelling.   If you were given an elastic bandage (dressing):  ? Take it off when you shower or bathe.  ? Try not to move your ankle much, but wiggle your toes from time to time. This helps to prevent swelling.  ? Adjust the bandage to make it more comfortable if it feels too tight.  ? Loosen the bandage if you lose feeling in your foot, your foot tingles, or your foot gets cold and blue.   If you have crutches, use them as told by your doctor. Continue to use them until you can walk without feeling pain in your ankle.  Contact a doctor if:   Your bruises or swelling are quickly getting worse.   Your pain does not get better after you take medicine.  Get help right away if:   You cannot feel your toes or foot.   Your toes or your foot looks blue.   You have very bad pain that gets worse.  This information is not intended to replace advice given to you by your health care provider. Make sure you discuss any questions you have with your health care provider.  Document Released: 05/22/2008 Document Revised: 05/11/2016 Document Reviewed: 07/06/2015  Elsevier Interactive Patient Education  2018 [employer]

## 2018-02-12 NOTE — Progress Notes (Signed)
Subjective:  I acted as a Neurosurgeonscribe for Textron IncDr.Pancho Rushing. Alicia Cox, RMA   Patient ID: Alicia Cox, female    DOB: [birth year], 47 y.o.   MRN: [id number]  Chief Complaint  Patient presents with  . Foot Pain    right foot pain since yesterday    HPI  Patient is in today for right foot pain. She does not recall any trauma or twisting her ankle but it has been hurting for the past day. It does not hurt significantly when she is resting but with twisting or weight bearing the pain is severe. No warmth or redness. No other acute complaints. Denies CP/palp/SOB/HA/congestion/fevers/GI or GU c/o. Taking meds as prescribed  Patient Care Team: Bradd CanaryBlyth, Alece Koppel A, MD as PCP - General (Family Medicine)   Past Medical History:  Diagnosis Date  . Acute bronchitis 11/03/2013  . Allergy    hay fever  . Arthritis   . Blood in stool   . Cervical cancer screening 09/14/2016   Menarche at 13 Regular and heavy flow no history of abnormal pap in past G2P2, s/p 2 svd No history of abnormal MGM No concerns today  gyn surgeries: tubal and endometrial ablation LMP 09/13/2016  . Chest pain 08/24/2013  . Chicken pox as a child  . Dermatitis 03/02/2016  . Frequent headaches   . GERD (gastroesophageal reflux disease)   . Heart murmur   . Hypothyroidism    Hashimoto's  . Knee pain, left 01/09/2014  . Low back pain 08/26/2017  . Migraine   . Obesity 11/15/2014  . Preventative health care 09/12/2015  . TOS (thoracic outlet syndrome) 08/25/2013  . UTI (urinary tract infection)   . Vitamin D deficiency     Past Surgical History:  Procedure Laterality Date  . APPENDECTOMY    . CESAREAN SECTION     twice  . CHOLECYSTECTOMY    . ENDOMETRIAL ABLATION    . TONSILLECTOMY    . TUBAL LIGATION  1999    Family History  Problem Relation Age of Onset  . Hyperlipidemia Mother   . Hypertension Mother   . Skin cancer Mother   . Diabetes Father        type 2  . Hypertension Sister   . Diabetes Sister   . Asthma Daughter     . Heart disease Maternal Grandmother   . Heart attack Maternal Grandfather   . Diabetes Paternal Grandmother   . Diabetes Paternal Grandfather     Social History   Socioeconomic History  . Marital status: Married    Spouse name: Not on file  . Number of children: Not on file  . Years of education: Not on file  . Highest education level: Not on file  Social Needs  . Financial resource strain: Not on file  . Food insecurity - worry: Not on file  . Food insecurity - inability: Not on file  . Transportation needs - medical: Not on file  . Transportation needs - non-medical: Not on file  Occupational History  . Not on file  Tobacco Use  . Smoking status: Never Smoker  . Smokeless tobacco: Never Used  . Tobacco comment: NEVER USED TOBACCO  Substance and Sexual Activity  . Alcohol use: No    Alcohol/week: 0.0 oz  . Drug use: No  . 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 Not on file  Social History Narrative  . Not on file    Outpatient Medications Prior to Visit  Medication Sig Dispense Refill  . ALPRAZolam (XANAX) 0.25 MG tablet Take 1 tablet (0.25 mg total) by mouth 2 (two) times daily as needed for anxiety. 20 tablet 0  . cholecalciferol (VITAMIN D) 1000 UNITS tablet Take 10,000 Units by mouth once a week.    . cyclobenzaprine (FLEXERIL) 10 MG tablet Take 1 tablet (10 mg total) by mouth at bedtime as needed for muscle spasms. 30 tablet 2  . Diclofenac Sodium (PENNSAID) 2 % SOLN Place 2 application onto the skin 2 (two) times daily. 112 g 3  . escitalopram (LEXAPRO) 10 MG tablet Take 1 tablet (10 mg total) by mouth daily. 90 tablet 1  . gabapentin (NEURONTIN) 100 MG capsule Take 2 capsules (200 mg total) by mouth at bedtime. 60 capsule 3  . levothyroxine (SYNTHROID, LEVOTHROID) 100 MCG tablet TAKE ONE TABLET BY MOUTH ONCE DAILY EXCEPT  TAKE  ONE  AND  ONE-HALF   TABLETS  ON  THURSDAYS  AND  SUNDAYS 105 tablet 3  . montelukast (SINGULAIR) 10 MG tablet Take 1 tablet (10 mg total) by mouth at bedtime. 90 tablet 1  . nystatin cream (MYCOSTATIN) Apply 1 application topically 2 (two) times daily. 30 g 1  . ranitidine (ZANTAC) 300 MG tablet Take 1 tablet (300 mg total) by mouth at bedtime. 30 tablet 6  . Rivaroxaban (XARELTO STARTER PACK) 15 & 20 MG TBPK Take as directed on package: Start with one 15mg  tablet by mouth twice a day with food. On Day 22, switch to one 20mg  tablet once a day with food. 51 each 0  . tiZANidine (ZANAFLEX) 2 MG tablet Take 1 tablet (2 mg total) by mouth at bedtime as needed for muscle spasms. 30 tablet 2  . triamcinolone cream (KENALOG) 0.1 % Apply 1 application topically 2 (two) times daily as needed. 30 g 0  . VENTOLIN HFA 108 (90 Base) MCG/ACT inhaler INHALE 2 PUFFS BY MOUTH INTO THE LUNGS EVERY 6 (SIX) HOURS AS NEEDED FOR WHEEZING OR SHORTNESS OF BREATH. 18 g 0  . Vitamin D, Ergocalciferol, (DRISDOL) 50000 units CAPS capsule Take 1 capsule (50,000 Units total) by mouth every 7 (seven) days. 12 capsule 0   No facility-administered medications prior to visit.     No Known Allergies  Review of Systems  Constitutional: Negative for fever and malaise/fatigue.  HENT: Negative for congestion.   Eyes: Negative for blurred vision.  Respiratory: Negative for shortness of breath.   Cardiovascular: Negative for chest pain, palpitations and leg swelling.  Gastrointestinal: Negative for abdominal pain, blood in stool and nausea.  Genitourinary: Negative for dysuria and frequency.  Musculoskeletal: Positive for joint pain. Negative for falls.  Skin: Negative for rash.  Neurological: Negative for dizziness, loss of consciousness and headaches.  Endo/Heme/Allergies: Negative for environmental allergies.  Psychiatric/Behavioral: Negative for depression. The patient is not nervous/anxious.        Objective:    Physical Exam    Constitutional: She is oriented to person, place, and time. She appears well-developed and well-nourished. No distress.  HENT:  Head: Normocephalic and atraumatic.  Nose: Nose normal.  Eyes: Right eye exhibits no discharge. Left eye exhibits no discharge.  Neck: Normal range of motion. Neck supple.  Cardiovascular: Normal rate and regular rhythm.  No murmur heard. Pulmonary/Chest: Effort normal and breath sounds normal.  Abdominal: Soft. Bowel sounds are normal. There is no tenderness.  Musculoskeletal: She exhibits  no edema.  Neurological: She is alert and oriented to person, place, and time.  Skin: Skin is warm and dry.  Psychiatric: She has a normal mood and affect.  Nursing note and vitals reviewed.   BP 128/78 (BP Location: Left Arm, Patient Position: Sitting, Cuff Size: Large)   Pulse 68   Temp 97.8 F (36.6 C) (Oral)   Resp 18   Ht 5\' 4"  (1.626 m)   Wt 237 lb (107.5 kg)   SpO2 98%   BMI 40.68 kg/m  Wt Readings from Last 3 Encounters:  02/12/18 237 lb (107.5 kg)  02/06/18 236 lb (107 kg)  01/25/18 235 lb 9.6 oz (106.9 kg)   BP Readings from Last 3 Encounters:  02/12/18 128/78  02/06/18 122/80  01/25/18 118/67     Immunization History  Administered Date(s) Administered  . Influenza,inj,Quad PF,6+ Mos 10/21/2013, 08/14/2014, 09/02/2015, 09/14/2016, 09/17/2017  . Tdap 10/21/2013    Health Maintenance  Topic Date Due  . HIV Screening  03/04/1987  . MAMMOGRAM  10/24/2017  . PAP SMEAR  09/15/2019  . TETANUS/TDAP  10/22/2023  . INFLUENZA VACCINE  Completed    Lab Results  Component Value Date   WBC 11.4 (H) 09/17/2017   HGB 14.7 09/17/2017   HCT 44.5 09/17/2017   PLT 380.0 09/17/2017   GLUCOSE 81 09/17/2017   CHOL 178 09/17/2017   TRIG 258.0 (H) 09/17/2017   HDL 38.10 (L) 09/17/2017   LDLDIRECT 104.0 09/17/2017   LDLCALC 117 (H) 03/09/2017   ALT 22 09/17/2017   AST 16 09/17/2017   NA 137 09/17/2017   K 4.1 09/17/2017   CL 102 09/17/2017    CREATININE 0.70 09/17/2017   BUN 13 09/17/2017   CO2 28 09/17/2017   TSH 1.65 09/17/2017   HGBA1C 5.6 09/17/2017    Lab Results  Component Value Date   TSH 1.65 09/17/2017   Lab Results  Component Value Date   WBC 11.4 (H) 09/17/2017   HGB 14.7 09/17/2017   HCT 44.5 09/17/2017   MCV 99.7 09/17/2017   PLT 380.0 09/17/2017   Lab Results  Component Value Date   NA 137 09/17/2017   K 4.1 09/17/2017   CO2 28 09/17/2017   GLUCOSE 81 09/17/2017   BUN 13 09/17/2017   CREATININE 0.70 09/17/2017   BILITOT 0.3 09/17/2017   ALKPHOS 52 09/17/2017   AST 16 09/17/2017   ALT 22 09/17/2017   PROT 7.7 09/17/2017   ALBUMIN 4.2 09/17/2017   CALCIUM 9.2 02/06/2018   GFR 95.94 09/17/2017   Lab Results  Component Value Date   CHOL 178 09/17/2017   Lab Results  Component Value Date   HDL 38.10 (L) 09/17/2017   Lab Results  Component Value Date   LDLCALC 117 (H) 03/09/2017   Lab Results  Component Value Date   TRIG 258.0 (H) 09/17/2017   Lab Results  Component Value Date   CHOLHDL 5 09/17/2017   Lab Results  Component Value Date   HGBA1C 5.6 09/17/2017         Assessment & Plan:   Problem List Items Addressed This Visit    Hyperglycemia      minimize simple carbs. Increase exercise as able      Obesity    Weight loss will typically help foot pain      Acute right ankle pain - Primary    She does recall any trauma but she has had severe pain with weight bearing since yesterday just above lateral malleolus  of right foot. No redness or warmth. Xray is unremarkable. Encouraged to apply ice, elevate, wrap and apply topical treatments report if worsens.       Relevant Orders   DG Ankle Complete Right (Completed)      I am having Miquel Lamson. Delahunty maintain her cholecalciferol, cyclobenzaprine, triamcinolone cream, ranitidine, nystatin cream, Diclofenac Sodium, gabapentin, VENTOLIN HFA, tiZANidine, escitalopram, montelukast, ALPRAZolam, Rivaroxaban, Vitamin D  (Ergocalciferol), and levothyroxine.  No orders of the defined types were placed in this encounter.    [name], MD

## 2018-02-13 DIAGNOSIS — M25571 Pain in right ankle and joints of right foot: Secondary | ICD-10-CM | POA: Insufficient documentation

## 2018-02-13 NOTE — Assessment & Plan Note (Signed)
Weight loss will typically help foot pain

## 2018-02-13 NOTE — Assessment & Plan Note (Signed)
She does recall any trauma but she has had severe pain with weight bearing since yesterday just above lateral malleolus of right foot. No redness or warmth. Xray is unremarkable. Encouraged to apply ice, elevate, wrap and apply topical treatments report if worsens.

## 2018-02-13 NOTE — Assessment & Plan Note (Signed)
   minimize simple carbs. Increase exercise as able

## 2018-02-18 ENCOUNTER — Encounter: Payer: Self-pay | Admitting: Family Medicine

## 2018-02-19 MED ORDER — GABAPENTIN 300 MG PO CAPS: ORAL_CAPSULE | ORAL | 3 refills | Status: DC

## 2018-02-20 ENCOUNTER — Inpatient Hospital Stay: Payer: BLUE CROSS/BLUE SHIELD | Attending: Hematology & Oncology | Admitting: Hematology & Oncology

## 2018-02-20 ENCOUNTER — Inpatient Hospital Stay: Payer: BLUE CROSS/BLUE SHIELD

## 2018-02-20 VITALS — BP 127/74 | HR 80 | Temp 97.7°F | Resp 18 | Wt 238.5 lb

## 2018-02-20 DIAGNOSIS — R011 Cardiac murmur, unspecified: Secondary | ICD-10-CM | POA: Diagnosis not present

## 2018-02-20 DIAGNOSIS — Z79899 Other long term (current) drug therapy: Secondary | ICD-10-CM | POA: Diagnosis not present

## 2018-02-20 DIAGNOSIS — Z7901 Long term (current) use of anticoagulants: Secondary | ICD-10-CM | POA: Diagnosis not present

## 2018-02-20 DIAGNOSIS — Z85828 Personal history of other malignant neoplasm of skin: Secondary | ICD-10-CM | POA: Insufficient documentation

## 2018-02-20 DIAGNOSIS — E559 Vitamin D deficiency, unspecified: Secondary | ICD-10-CM | POA: Insufficient documentation

## 2018-02-20 DIAGNOSIS — E063 Autoimmune thyroiditis: Secondary | ICD-10-CM | POA: Diagnosis not present

## 2018-02-20 DIAGNOSIS — Z7982 Long term (current) use of aspirin: Secondary | ICD-10-CM | POA: Insufficient documentation

## 2018-02-20 DIAGNOSIS — I82401 Acute embolism and thrombosis of unspecified deep veins of right lower extremity: Secondary | ICD-10-CM

## 2018-02-20 DIAGNOSIS — K219 Gastro-esophageal reflux disease without esophagitis: Secondary | ICD-10-CM | POA: Diagnosis not present

## 2018-02-20 LAB — CBC WITH DIFFERENTIAL (CANCER CENTER ONLY)
BASOS ABS: 0.1 10*3/uL (ref 0.0–0.1)
BASOS PCT: 1 %
Eosinophils Absolute: 0.2 10*3/uL (ref 0.0–0.5)
Eosinophils Relative: 2 %
HEMATOCRIT: 42.3 % (ref 34.8–46.6)
HEMOGLOBIN: 14.4 g/dL (ref 11.6–15.9)
Lymphocytes Relative: 20 %
Lymphs Abs: 2.7 10*3/uL (ref 0.9–3.3)
MCH: 33.9 pg (ref 26.0–34.0)
MCHC: 34 g/dL (ref 32.0–36.0)
MCV: 99.5 fL (ref 81.0–101.0)
MONO ABS: 0.8 10*3/uL (ref 0.1–0.9)
Monocytes Relative: 6 %
NEUTROS ABS: 9.6 10*3/uL — AB (ref 1.5–6.5)
Neutrophils Relative %: 71 %
Platelet Count: 311 10*3/uL (ref 145–400)
RBC: 4.25 MIL/uL (ref 3.70–5.32)
RDW: 12.4 % (ref 11.1–15.7)
WBC: 13.3 10*3/uL — AB (ref 3.9–10.0)

## 2018-02-20 LAB — D-DIMER, QUANTITATIVE: D-Dimer, Quant: 0.38 ug/mL-FEU (ref 0.00–0.50)

## 2018-02-20 LAB — CMP (CANCER CENTER ONLY)
ALK PHOS: 59 U/L (ref 26–84)
ALT: 38 U/L (ref 10–47)
ANION GAP: 9 (ref 5–15)
AST: 26 U/L (ref 11–38)
Albumin: 3.4 g/dL — ABNORMAL LOW (ref 3.5–5.0)
BILIRUBIN TOTAL: 0.5 mg/dL (ref 0.2–1.6)
BUN: 9 mg/dL (ref 7–22)
CALCIUM: 8.5 mg/dL (ref 8.0–10.3)
CO2: 29 mmol/L (ref 18–33)
Chloride: 103 mmol/L (ref 98–108)
Creatinine: 0.8 mg/dL (ref 0.60–1.20)
Glucose, Bld: 115 mg/dL (ref 73–118)
Potassium: 3.3 mmol/L (ref 3.3–4.7)
Sodium: 141 mmol/L (ref 128–145)
TOTAL PROTEIN: 6.8 g/dL (ref 6.4–8.1)

## 2018-02-20 NOTE — Progress Notes (Signed)
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 New Patient (Initial Visit)  : I have another blood clot.  HPI: Ms. Alicia Cox is well-known to [place].  She is a very charming 47 year old white female.  We have seen her in the past.  We last saw her back in May 2016.  At that time, she had a thrombus in the left leg.  This was in the superficial femoral vein and the popliteal vein.  A Doppler done on the leg that day showed a stable but nonocclusive thrombus in the left leg.  She had been on Xarelto.  We moved her over to baby aspirin.  She has been doing well with baby aspirin.  She is active.  She flew up to [facility] for the holidays.  She had no problems.  Recently, she complained of some pain behind the right lower leg.  This is in the calf.  She had not had any trauma.  She had no period of inactivity.  She does not smoke.  She does not take any estrogens.  She ultimately had a Doppler done.  Shocking enough, this showed a new thrombus.  This was in the right posterior tibial vein.  Also noted was a thrombus in the soleal vein.  She was placed onto Xarelto.  Of note, when we had seen her before, we did a thrombophilic workup.  This was all negative.  She is lost some weight.  This is been a conscientious weight loss.  She has had no rashes.  There is been no fever.  She has had no change in bowel or bladder habits.  If she does get mammograms every other year.  We are asked to see her again to help with anticoagulation management.  She does look quite good.  Her left leg has been swollen.  This is chronic.  He does wear a compression stocking on both legs.  Her appetite is good.  She is not diabetic.  She has no issues with cholesterol.  She is not a vegetarian.  Overall, her performance status is ECOG 1.   Past Medical History:  Diagnosis Date  . Acute bronchitis 11/03/2013  . Allergy    hay fever  .  Arthritis   . Blood in stool   . Cervical cancer screening 09/14/2016   Menarche at 13 Regular and heavy flow no history of abnormal pap in past G2P2, s/p 2 svd No history of abnormal MGM No concerns today  gyn surgeries: tubal and endometrial ablation LMP 09/13/2016  . Chest pain 08/24/2013  . Chicken pox as a child  . Dermatitis 03/02/2016  . Frequent headaches   . GERD (gastroesophageal reflux disease)   . Heart murmur   . Hypothyroidism    Hashimoto's  . Knee pain, left 01/09/2014  . Low back pain 08/26/2017  . Migraine   . Obesity 11/15/2014  . Preventative health care 09/12/2015  . TOS (thoracic outlet syndrome) 08/25/2013  . UTI (urinary tract infection)   . Vitamin D deficiency   :  Past Surgical History:  Procedure Laterality Date  . APPENDECTOMY    . CESAREAN SECTION     twice  . CHOLECYSTECTOMY    . ENDOMETRIAL ABLATION    . TONSILLECTOMY    . TUBAL LIGATION  1999  :   Current Outpatient Medications:  .  ALPRAZolam (XANAX) 0.25 MG tablet, Take 1 tablet (0.25 mg  total) by mouth 2 (two) times daily as needed for anxiety., Disp: 20 tablet, Rfl: 0 .  cholecalciferol (VITAMIN D) 1000 UNITS tablet, Take 10,000 Units by mouth once a week., Disp: , Rfl:  .  cyclobenzaprine (FLEXERIL) 10 MG tablet, Take 1 tablet (10 mg total) by mouth at bedtime as needed for muscle spasms., Disp: 30 tablet, Rfl: 2 .  Diclofenac Sodium (PENNSAID) 2 % SOLN, Place 2 application onto the skin 2 (two) times daily., Disp: 112 g, Rfl: 3 .  escitalopram (LEXAPRO) 10 MG tablet, Take 1 tablet (10 mg total) by mouth daily., Disp: 90 tablet, Rfl: 1 .  gabapentin (NEURONTIN) 300 MG capsule, nightly, Disp: 90 capsule, Rfl: 3 .  levothyroxine (SYNTHROID, LEVOTHROID) 100 MCG tablet, TAKE ONE TABLET BY MOUTH ONCE DAILY EXCEPT  TAKE  ONE  AND  ONE-HALF  TABLETS  ON  THURSDAYS  AND  SUNDAYS, Disp: 105 tablet, Rfl: 3 .  montelukast (SINGULAIR) 10 MG tablet, Take 1 tablet (10 mg total) by mouth at bedtime., Disp: 90  tablet, Rfl: 1 .  nystatin cream (MYCOSTATIN), Apply 1 application topically 2 (two) times daily., Disp: 30 g, Rfl: 1 .  ranitidine (ZANTAC) 300 MG tablet, Take 1 tablet (300 mg total) by mouth at bedtime., Disp: 30 tablet, Rfl: 6 .  Rivaroxaban (XARELTO STARTER PACK) 15 & 20 MG TBPK, Take as directed on package: Start with one 15mg  tablet by mouth twice a day with food. On Day 22, switch to one 20mg  tablet once a day with food., Disp: 51 each, Rfl: 0 .  tiZANidine (ZANAFLEX) 2 MG tablet, Take 1 tablet (2 mg total) by mouth at bedtime as needed for muscle spasms., Disp: 30 tablet, Rfl: 2 .  triamcinolone cream (KENALOG) 0.1 %, Apply 1 application topically 2 (two) times daily as needed., Disp: 30 g, Rfl: 0 .  VENTOLIN HFA 108 (90 Base) MCG/ACT inhaler, INHALE 2 PUFFS BY MOUTH INTO THE LUNGS EVERY 6 (SIX) HOURS AS NEEDED FOR WHEEZING OR SHORTNESS OF BREATH., Disp: 18 g, Rfl: 0 .  Vitamin D, Ergocalciferol, (DRISDOL) 50000 units CAPS capsule, Take 1 capsule (50,000 Units total) by mouth every 7 (seven) days., Disp: 12 capsule, Rfl: 0:  :  No Known Allergies:  Family History  Problem Relation Age of Onset  . Hyperlipidemia Mother   . Hypertension Mother   . Skin cancer Mother   . Diabetes Father        type 2  . Hypertension Sister   . Diabetes Sister   . Asthma Daughter   . Heart disease Maternal Grandmother   . Heart attack Maternal Grandfather   . Diabetes Paternal Grandmother   . Diabetes Paternal Grandfather   :  Social History   Socioeconomic History  . Marital status: Married    Spouse name: Not on file  . Number of children: Not on file  . Years of education: Not on file  . Highest education level: Not on file  Social Needs  . Financial resource strain: Not on file  . Food insecurity - worry: Not on file  . Food insecurity - inability: Not on file  . Transportation needs - medical: Not on file  . Transportation needs - non-medical: Not on file  Occupational History  .  Not on file  Tobacco Use  . Smoking status: Never Smoker  . Smokeless tobacco: Never Used  . Tobacco comment: NEVER USED TOBACCO  Substance and Sexual Activity  . Alcohol use: No  Alcohol/week: 0.0 oz  . Drug use: No  . 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 Not on file  Social History Narrative  . Not on file  :  Review of Systems  Constitutional: Negative.   HENT: Negative.   Eyes: Negative.   Respiratory: Negative.   Cardiovascular: Negative.   Gastrointestinal: Negative.   Genitourinary: Negative.   Musculoskeletal: Negative.   Skin: Negative.   Neurological: Negative.   Endo/Heme/Allergies: Negative.   Psychiatric/Behavioral: Negative.      Exam: Well-developed and well-nourished white female in no obvious distress.  She is slightly overweight.  Her vital signs show temperature of 97.7.  Pulse 80.  Blood pressure 127/74.  Weight is 239 pounds.  Head exam shows no ocular or oral lesions.  There are no palpable cervical or supraclavicular lymph nodes.  Lungs are clear bilaterally.  Cardiac exam regular rate and rhythm with a normal S1 and S2.  There are no murmurs, rubs or bruits.  Abdomen is soft.  She is mildly obese.  She has good bowel sounds.  There is no fluid wave.  There is no palpable liver or spleen tip.  Back exam shows no tenderness over the spine, ribs or hips.  Extremities shows compression stockings on both legs.  She has a negative Homans sign bilaterally.  No palpable venous cord is noted in her lower legs.  She has good range of motion of her joints.  Skin exam shows no rashes, ecchymoses or petechia.  Neurological exam shows no focal neurological deficits. @IPVITALS @   Recent Labs    02/20/18 1402  WBC 13.3*  HCT 42.3  PLT 311   Recent Labs    02/20/18 1402  NA 141  K 3.3  CL 103  CO2 29  GLUCOSE 115  BUN 9  CREATININE 0.80   CALCIUM 8.5    Blood smear review: None  Pathology: None    Assessment and Plan: Ms. Alicia Cox is a charming 47 year-old white female.  She has another thromboembolic event.  Her last one was about 4 years ago.  I really would hate to commit her to lifelong anticoagulation.  However, we might be looking at this.  For right now, we will have her on Xarelto.  I would keep her on full dose Xarelto for at least 6 months.  Then I would have her on maintenance dose of 10 mg p.o. daily for 1 year.  I would like to repeat a Doppler of her legs in about 2 months.  I would like to see how her thrombi are responding to therapy.  I think that the compression stockings are a great idea.  She is quite active.  I would hate to see her develop a post thrombotic state.  I spent about 40 minutes with her.  Over 50% of the time was spent face-to-face with her.  We will plan to get her back in 2 months.  We will get the Doppler we see her back.  I do not see that we had to do any CT scans.  I do not see that we have to do any type of malignancy workup.

## 2018-02-27 ENCOUNTER — Other Ambulatory Visit: Payer: Self-pay | Admitting: Family Medicine

## 2018-02-27 ENCOUNTER — Other Ambulatory Visit: Payer: Self-pay | Admitting: Medical

## 2018-02-28 MED FILL — ESCITALOPRAM 10 MG TABLET: 10 | 90 days supply | Qty: 90 | Fill #0

## 2018-03-01 MED FILL — XARELTO 20 MG TABLET: 20 | 30 days supply | Qty: 30 | Fill #0

## 2018-03-05 NOTE — Progress Notes (Signed)
[name] [address] [place], [place] [address] Phone: [phone] Subjective:    CC: Left shoulder pain, bilateral knee pain  [id number]  Alicia Cox is a 47 y.o. female coming in with complaint of bilateral shoulder pain with left greater than right.  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  Patient does have x-rays of the neck showing mild osteoarthritic changes but states that there is not a significant amount of pain.  Patient is at a loss at this moment.  Patient is looking for anything else.  Reviewing patient's notes patient had WBC elevated. 13.3 and was increasing.  Seen hematology and stated they would like retest but she does not know when.      Past Medical History:  Diagnosis Date  . Acute bronchitis 11/03/2013  . Allergy    hay fever  . Arthritis   . Blood in stool   . Cervical cancer screening 09/14/2016   Menarche at 13 Regular and heavy flow no history of abnormal pap in past G2P2, s/p 2 svd No history of abnormal MGM No concerns today  gyn surgeries: tubal and endometrial ablation LMP 09/13/2016  . Chest pain 08/24/2013  . Chicken pox as a child  . Dermatitis 03/02/2016  . Frequent headaches   . GERD (gastroesophageal reflux disease)   . Heart murmur   . Hypothyroidism    Hashimoto's  . Knee pain, left 01/09/2014  . Low back pain 08/26/2017  . Migraine   . Obesity 11/15/2014  . Preventative health care 09/12/2015  . TOS (thoracic outlet syndrome) 08/25/2013  . UTI (urinary tract infection)   . Vitamin D deficiency    Past Surgical History:  Procedure Laterality Date  . APPENDECTOMY    . CESAREAN SECTION     twice  . CHOLECYSTECTOMY    . ENDOMETRIAL ABLATION    . TONSILLECTOMY    . TUBAL LIGATION  1999   Social History   Socioeconomic History  . Marital status: Married    Spouse name: None  . Number of children: None  . Years of education:  None  . Highest education level: None  Social Needs  . Financial resource strain: None  . Food insecurity - worry: None  . Food insecurity - inability: None  . Transportation needs - medical: None  . Transportation needs - non-medical: None  Occupational History  . None  Tobacco Use  . Smoking status: Never Smoker  . Smokeless tobacco: Never Used  . Tobacco comment: NEVER USED TOBACCO  Substance and Sexual Activity  . Alcohol use: No    Alcohol/week: 0.0 oz  . Drug use: No  . 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 None  Social History Narrative  . None   No Known Allergies Family History  Problem Relation Age of Onset  . Hyperlipidemia Mother   . Hypertension Mother   . Skin cancer Mother   . Diabetes Father        type 2  . Hypertension Sister   . Diabetes Sister   . Asthma Daughter   . Heart disease Maternal Grandmother   . Heart attack Maternal Grandfather   . Diabetes Paternal Grandmother   . Diabetes Paternal Grandfather      Past medical history,  social, surgical and family history all reviewed in electronic medical record.  No pertanent information unless stated regarding to the chief complaint.   Review of Systems:Review of systems updated and as accurate as of 03/06/18  No  visual changes, nausea, vomiting, diarrhea, constipation, dizziness, abdominal pain, skin rash, fevers, chills, night sweats, weight loss, swollen lymph nodes, body aches, joint swelling, chest pain, shortness of breath, mood changes.  Positive muscle aches, headache  Objective  Blood pressure 124/90, pulse 83, height 5\' 4"  (1.626 m), weight 238 lb (108 kg), SpO2 97 %. Systems examined below as of 03/06/18   General: No apparent distress alert and oriented x3 mood and affect normal, dressed appropriately.  HEENT: Pupils equal, extraocular movements intact  Respiratory:  Patient's speak in full sentences and does not appear short of breath  Cardiovascular: No lower extremity edema, non tender, no erythema  Skin: Warm dry intact with no signs of infection or rash on extremities or on axial skeleton.  Abdomen: Soft nontender  Neuro: Cranial nerves II through XII are intact, neurovascularly intact in all extremities with 2+ DTRs and 2+ pulses.  Lymph: No lymphadenopathy of posterior or anterior cervical chain or axillae bilaterally.  Gait normal with good balance and coordination.  MSK:  Non tender with full range of motion and good stability and symmetric strength and tone of  elbows, wrist, hip and ankles bilaterally.  Shoulder exam bilaterally does have near full range of motion.  Positive impingement bilaterally.  4+ out of 5 strength.  Neurovascularly intact distally.  Neck does have some tightness noted.  Some mild limitation in rotation bilaterally.  Negative Spurling's bilaterally. Knee exam shows some moderate to severe arthritic changes with instability.  Mild swelling of the left knee.  Osteopathic findings C2 flexed rotated and side bent right C6 flexed rotated and side bent left T2 extended rotated and side bent right  T5 extended rotated and side bent left L5 flexed rotated and side bent left  Sacrum right on right     Impression and Recommendations:     This case required medical decision making of moderate complexity.      Note: This dictation was prepared with Dragon dictation along with smaller phrase technology. Any transcriptional errors that result from this process are unintentional.

## 2018-03-06 ENCOUNTER — Encounter: Payer: Self-pay | Admitting: Family Medicine

## 2018-03-06 ENCOUNTER — Other Ambulatory Visit (INDEPENDENT_AMBULATORY_CARE_PROVIDER_SITE_OTHER): Payer: BLUE CROSS/BLUE SHIELD

## 2018-03-06 ENCOUNTER — Ambulatory Visit: Payer: BLUE CROSS/BLUE SHIELD | Admitting: Family Medicine

## 2018-03-06 VITALS — BP 124/90 | HR 83 | Ht 64.0 in | Wt 238.0 lb

## 2018-03-06 DIAGNOSIS — M545 Low back pain: Secondary | ICD-10-CM | POA: Diagnosis not present

## 2018-03-06 DIAGNOSIS — M2550 Pain in unspecified joint: Secondary | ICD-10-CM | POA: Diagnosis not present

## 2018-03-06 DIAGNOSIS — M999 Biomechanical lesion, unspecified: Secondary | ICD-10-CM | POA: Diagnosis not present

## 2018-03-06 LAB — CBC WITH DIFFERENTIAL/PLATELET
Basophils Absolute: 0.1 10*3/uL (ref 0.0–0.1)
Basophils Relative: 0.9 % (ref 0.0–3.0)
EOS PCT: 1.7 % (ref 0.0–5.0)
Eosinophils Absolute: 0.2 10*3/uL (ref 0.0–0.7)
HCT: 46.8 % — ABNORMAL HIGH (ref 36.0–46.0)
Hemoglobin: 15.9 g/dL — ABNORMAL HIGH (ref 12.0–15.0)
LYMPHS ABS: 2.2 10*3/uL (ref 0.7–4.0)
Lymphocytes Relative: 20.6 % (ref 12.0–46.0)
MCHC: 34.1 g/dL (ref 30.0–36.0)
MCV: 97.4 fl (ref 78.0–100.0)
MONO ABS: 0.8 10*3/uL (ref 0.1–1.0)
Monocytes Relative: 7 % (ref 3.0–12.0)
NEUTROS PCT: 69.8 % (ref 43.0–77.0)
Neutro Abs: 7.6 10*3/uL (ref 1.4–7.7)
Platelets: 402 10*3/uL — ABNORMAL HIGH (ref 150.0–400.0)
RBC: 4.8 Mil/uL (ref 3.87–5.11)
RDW: 12.7 % (ref 11.5–15.5)
WBC: 10.9 10*3/uL — ABNORMAL HIGH (ref 4.0–10.5)

## 2018-03-06 NOTE — Assessment & Plan Note (Signed)
Decision today to treat with OMT was based on Physical Exam  After verbal consent patient was treated with HVLA, ME, FPR techniques in cervical, thoracic, lumbar and sacral areas  Patient tolerated the procedure well with improvement in symptoms  Patient given exercises, stretches and lifestyle modifications  See medications in patient instructions if given  Patient will follow up in 4-6 weeks 

## 2018-03-06 NOTE — Patient Instructions (Signed)
Good to see you  Alicia Cox is your friend.  On wall with heels, butt shoulder and head touching for a goal of 5 minutes daily  Lab downstairs and I will write you  Tried manipulation  See me again in 3-4 weeks

## 2018-03-06 NOTE — Assessment & Plan Note (Signed)
Patient continues to have significant amount of discomfort and pain.  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  Patient has had an elevated white blood cell count.  Recheck at this point to make sure it is coming down.  Discussed icing regimen and home exercises.  Discussed which activities to do discussed icing regimen and home exercises.  Patient will follow up with me again 4 weeks.

## 2018-03-18 ENCOUNTER — Ambulatory Visit: Payer: BLUE CROSS/BLUE SHIELD | Admitting: Family Medicine

## 2018-03-18 ENCOUNTER — Ambulatory Visit (HOSPITAL_BASED_OUTPATIENT_CLINIC_OR_DEPARTMENT_OTHER)
Admission: RE | Admit: 2018-03-18 | Discharge: 2018-03-18 | Disposition: A | Discharge status: Home / Self Care | Payer: BLUE CROSS/BLUE SHIELD | Source: Ambulatory Visit | Attending: Family Medicine | Admitting: Family Medicine

## 2018-03-18 DIAGNOSIS — M47812 Spondylosis without myelopathy or radiculopathy, cervical region: Secondary | ICD-10-CM | POA: Insufficient documentation

## 2018-03-18 DIAGNOSIS — M542 Cervicalgia: Secondary | ICD-10-CM

## 2018-03-18 DIAGNOSIS — R739 Hyperglycemia, unspecified: Secondary | ICD-10-CM | POA: Diagnosis not present

## 2018-03-18 DIAGNOSIS — E782 Mixed hyperlipidemia: Secondary | ICD-10-CM

## 2018-03-18 DIAGNOSIS — Z79899 Other long term (current) drug therapy: Secondary | ICD-10-CM

## 2018-03-18 DIAGNOSIS — I82409 Acute embolism and thrombosis of unspecified deep veins of unspecified lower extremity: Secondary | ICD-10-CM

## 2018-03-18 DIAGNOSIS — M999 Biomechanical lesion, unspecified: Secondary | ICD-10-CM | POA: Diagnosis not present

## 2018-03-18 DIAGNOSIS — M79622 Pain in left upper arm: Secondary | ICD-10-CM

## 2018-03-18 MED ORDER — TIZANIDINE HCL 4 MG PO TABS: 2.0000 mg | ORAL_TABLET | Freq: Three times a day (TID) | ORAL | 2 refills | Status: DC | PRN

## 2018-03-18 MED FILL — tiZANidine HCL 4 MG TABS: 4 | 13 days supply | Qty: 40 | Fill #0

## 2018-03-18 NOTE — Progress Notes (Signed)
Subjective:  I acted as a Neurosurgeonscribe for Dr. Abner GreenspanBlyth. [name], [name]  Patient ID: Alicia Cox, female    DOB: 1971/06/13, 47 y.o.   MRN: [id number]  No chief complaint on file.   HPI  Patient is in today for 6 month follow up and overall she is doing well but has had a flare in her neck pain with radicular symptoms down left arm with pain and weakness. Is following with sports med but pain continues. No recent fall or injury. Denies CP/palp/SOB/HA/congestion/fevers/GI or GU c/o. Taking meds as prescribed  Patient Care Team: Bradd CanaryBlyth, Stacey A, MD as PCP - General (Family Medicine)   Past Medical History:  Diagnosis Date  . Acute bronchitis 11/03/2013  . Allergy    hay fever  . Arthritis   . Blood in stool   . Cervical cancer screening 09/14/2016   Menarche at 13 Regular and heavy flow no history of abnormal pap in past G2P2, s/p 2 svd No history of abnormal MGM No concerns today  gyn surgeries: tubal and endometrial ablation LMP 09/13/2016  . Chest pain 08/24/2013  . Chicken pox as a child  . Dermatitis 03/02/2016  . Frequent headaches   . GERD (gastroesophageal reflux disease)   . Heart murmur   . Hypothyroidism    Hashimoto's  . Knee pain, left 01/09/2014  . Low back pain 08/26/2017  . Migraine   . Obesity 11/15/2014  . Preventative health care 09/12/2015  . TOS (thoracic outlet syndrome) 08/25/2013  . UTI (urinary tract infection)   . Vitamin D deficiency     Past Surgical History:  Procedure Laterality Date  . APPENDECTOMY    . CESAREAN SECTION     twice  . CHOLECYSTECTOMY    . ENDOMETRIAL ABLATION    . TONSILLECTOMY    . TUBAL LIGATION  1999    Family History  Problem Relation Age of Onset  . Hyperlipidemia Mother   . Hypertension Mother   . Skin cancer Mother   . Diabetes Father        type 2  . Hypertension Sister   . Diabetes Sister   . Asthma Daughter   . Heart disease Maternal Grandmother   . Heart attack Maternal Grandfather   . Diabetes Paternal  Grandmother   . Diabetes Paternal Grandfather     Social History   Socioeconomic History  . Marital status: Married    Spouse name: Not on file  . Number of children: Not on file  . Years of education: Not on file  . Highest education level: Not on file  Occupational History  . Not on file  Social Needs  . Financial resource strain: Not on file  . Food insecurity:    Worry: Not on file    Inability: Not on file  . Transportation needs:    Medical: Not on file    Non-medical: Not on file  Tobacco Use  . Smoking status: Never Smoker  . Smokeless tobacco: Never Used  . Tobacco comment: NEVER USED TOBACCO  Substance and Sexual Activity  . Alcohol use: No    Alcohol/week: 0.0 oz  . Drug use: No  . 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 Physical activity:    Days per week: Not on file    Minutes per session: Not on file  . Stress: Not on file  Relationships  . Social connections:    Talks on phone: Not on file    Gets together: Not on file    Attends religious service: Not on file    Active member of club or organization: Not on file    Attends meetings of clubs or organizations: Not on file    Relationship status: Not on file  . Intimate partner violence:    Fear of current or ex partner: Not on file    Emotionally abused: Not on file    Physically abused: Not on file    Forced sexual activity: Not on file  Other Topics Concern  . Not on file  Social History Narrative  . Not on file    Outpatient Medications Prior to Visit  Medication Sig Dispense Refill  . ALPRAZolam (XANAX) 0.25 MG tablet Take 1 tablet (0.25 mg total) by mouth 2 (two) times daily as needed for anxiety. 20 tablet 0  . cholecalciferol (VITAMIN D) 1000 UNITS tablet Take 10,000 Units by mouth once a week.    . Diclofenac Sodium (PENNSAID) 2 % SOLN Place 2 application onto the skin 2 (two) times  daily. 112 g 3  . escitalopram (LEXAPRO) 10 MG tablet TAKE 1 TABLET (10 MG TOTAL) BY MOUTH DAILY. 90 tablet 1  . gabapentin (NEURONTIN) 300 MG capsule nightly 90 capsule 3  . levothyroxine (SYNTHROID, LEVOTHROID) 100 MCG tablet TAKE ONE TABLET BY MOUTH ONCE DAILY EXCEPT  TAKE  ONE  AND  ONE-HALF  TABLETS  ON  THURSDAYS  AND  SUNDAYS 105 tablet 3  . montelukast (SINGULAIR) 10 MG tablet Take 1 tablet (10 mg total) by mouth at bedtime. 90 tablet 1  . nystatin cream (MYCOSTATIN) Apply 1 application topically 2 (two) times daily. 30 g 1  . ranitidine (ZANTAC) 300 MG tablet Take 1 tablet (300 mg total) by mouth at bedtime. 30 tablet 6  . triamcinolone cream (KENALOG) 0.1 % Apply 1 application topically 2 (two) times daily as needed. 30 g 0  . VENTOLIN HFA 108 (90 Base) MCG/ACT inhaler INHALE 2 PUFFS BY MOUTH INTO THE LUNGS EVERY 6 (SIX) HOURS AS NEEDED FOR WHEEZING OR SHORTNESS OF BREATH. 18 g 0  . Vitamin D, Ergocalciferol, (DRISDOL) 50000 units CAPS capsule Take 1 capsule (50,000 Units total) by mouth every 7 (seven) days. 12 capsule 0  . cyclobenzaprine (FLEXERIL) 10 MG tablet Take 1 tablet (10 mg total) by mouth at bedtime as needed for muscle spasms. 30 tablet 2  . Rivaroxaban (XARELTO STARTER PACK) 15 & 20 MG TBPK Take as directed on package: Start with one 15mg  tablet by mouth twice a day with food. On Day 22, switch to one 20mg  tablet once a day with food. 51 each 0  . tiZANidine (ZANAFLEX) 2 MG tablet Take 1 tablet (2 mg total) by mouth at bedtime as needed for muscle spasms. 30 tablet 2   No facility-administered medications prior to visit.     No Known Allergies  Review of Systems  Constitutional: Negative for fever and malaise/fatigue.  HENT: Negative for congestion.   Eyes: Negative for blurred vision.  Respiratory: Negative for shortness of breath.   Cardiovascular: Negative for chest pain, palpitations and leg swelling.  Gastrointestinal: Negative for abdominal pain, blood in stool  and nausea.  Genitourinary: Negative for dysuria and frequency.  Musculoskeletal: Positive for joint pain and neck pain. Negative for falls.  Skin: Negative for rash.  Neurological: Negative for dizziness, loss of consciousness and headaches.  Endo/Heme/Allergies:  Negative for environmental allergies.  Psychiatric/Behavioral: Negative for depression. The patient is not nervous/anxious.        Objective:    Physical Exam  Constitutional: She is oriented to person, place, and time. She appears well-developed and well-nourished. No distress.  HENT:  Head: Normocephalic and atraumatic.  Nose: Nose normal.  Eyes: Right eye exhibits no discharge. Left eye exhibits no discharge.  Neck: Normal range of motion. Neck supple.  Cardiovascular: Normal rate and regular rhythm.  No murmur heard. Pulmonary/Chest: Effort normal and breath sounds normal.  Abdominal: Soft. Bowel sounds are normal. There is no tenderness.  Musculoskeletal: She exhibits no edema.  Neurological: She is alert and oriented to person, place, and time.  Skin: Skin is warm and dry.  Psychiatric: She has a normal mood and affect.  Nursing note and vitals reviewed.   There were no vitals taken for this visit. Wt Readings from Last 3 Encounters:  03/06/18 238 lb (108 kg)  02/20/18 238 lb 8 oz (108.2 kg)  02/12/18 237 lb (107.5 kg)   BP Readings from Last 3 Encounters:  03/06/18 124/90  02/20/18 127/74  02/12/18 128/78     Immunization History  Administered Date(s) Administered  . Influenza,inj,Quad PF,6+ Mos 10/21/2013, 08/14/2014, 09/02/2015, 09/14/2016, 09/17/2017  . Tdap 10/21/2013    Health Maintenance  Topic Date Due  . HIV Screening  03/04/1987  . MAMMOGRAM  10/24/2017  . INFLUENZA VACCINE  07/18/2018  . PAP SMEAR  09/15/2019  . TETANUS/TDAP  10/22/2023    Lab Results  Component Value Date   WBC 10.9 (H) 03/06/2018   HGB 15.9 (H) 03/06/2018   HCT 46.8 (H) 03/06/2018   PLT 402.0 (H) 03/06/2018    GLUCOSE 115 02/20/2018   CHOL 178 09/17/2017   TRIG 258.0 (H) 09/17/2017   HDL 38.10 (L) 09/17/2017   LDLDIRECT 104.0 09/17/2017   LDLCALC 117 (H) 03/09/2017   ALT 38 02/20/2018   AST 26 02/20/2018   NA 141 02/20/2018   K 3.3 02/20/2018   CL 103 02/20/2018   CREATININE 0.80 02/20/2018   BUN 9 02/20/2018   CO2 29 02/20/2018   TSH 1.65 09/17/2017   HGBA1C 5.6 09/17/2017    Lab Results  Component Value Date   TSH 1.65 09/17/2017   Lab Results  Component Value Date   WBC 10.9 (H) 03/06/2018   HGB 15.9 (H) 03/06/2018   HCT 46.8 (H) 03/06/2018   MCV 97.4 03/06/2018   PLT 402.0 (H) 03/06/2018   Lab Results  Component Value Date   NA 141 02/20/2018   K 3.3 02/20/2018   CO2 29 02/20/2018   GLUCOSE 115 02/20/2018   BUN 9 02/20/2018   CREATININE 0.80 02/20/2018   BILITOT 0.5 02/20/2018   ALKPHOS 59 02/20/2018   AST 26 02/20/2018   ALT 38 02/20/2018   PROT 6.8 02/20/2018   ALBUMIN 3.4 (L) 02/20/2018   CALCIUM 8.5 02/20/2018   ANIONGAP 9 02/20/2018   GFR 95.94 09/17/2017   Lab Results  Component Value Date   CHOL 178 09/17/2017   Lab Results  Component Value Date   HDL 38.10 (L) 09/17/2017   Lab Results  Component Value Date   LDLCALC 117 (H) 03/09/2017   Lab Results  Component Value Date   TRIG 258.0 (H) 09/17/2017   Lab Results  Component Value Date   CHOLHDL 5 09/17/2017   Lab Results  Component Value Date   HGBA1C 5.6 09/17/2017         Assessment &  Plan:   Problem List Items Addressed This Visit    Hyperglycemia    minimize simple carbs. Increase exercise as tolerated.      Relevant Orders   TSH   Hyperlipidemia, mixed    Encouraged heart healthy diet, increase exercise, avoid trans fats, consider a krill oil cap daily      Relevant Orders   TSH   DVT of lower limb, acute (HCC)    Leg feeling better and tolerating xarelto. Due to recurrence will need to stay onmedicaiton.      Nonallopathic lesion of cervical region    Worsening  neck pain with lue radicular pain and weakness. Will proceed with xray and consider MRI. Will try increasing Tizanidine to 4 mg doses and continue Tylenol, lidocaine      Neck pain   Relevant Medications   tiZANidine (ZANAFLEX) 4 MG tablet   Other Relevant Orders   DG Cervical Spine Complete    Other Visit Diagnoses    High risk medication use    -  Primary   Relevant Orders   Pain Mgmt, Profile 8 w/Conf, U   Left upper arm pain       Relevant Medications   tiZANidine (ZANAFLEX) 4 MG tablet   Other Relevant Orders   DG Cervical Spine Complete      I have discontinued Aida Raider. Brick's cyclobenzaprine, tiZANidine, and Rivaroxaban. I am also having her start on tiZANidine. Additionally, I am having her maintain her cholecalciferol, triamcinolone cream, ranitidine, nystatin cream, Diclofenac Sodium, VENTOLIN HFA, montelukast, ALPRAZolam, Vitamin D (Ergocalciferol), levothyroxine, gabapentin, and escitalopram.  Meds ordered this encounter  Medications  . tiZANidine (ZANAFLEX) 4 MG tablet    Sig: Take 0.5-1 tablets (2-4 mg total) by mouth every 8 (eight) hours as needed for muscle spasms.    Dispense:  40 tablet    Refill:  2    CMA served as scribe during this visit. History, Physical and Plan performed by medical provider. Documentation and orders reviewed and attested to.  [name], MD

## 2018-03-18 NOTE — Assessment & Plan Note (Signed)
Leg feeling better and tolerating xarelto. Due to recurrence will need to stay onmedicaiton.

## 2018-03-18 NOTE — Assessment & Plan Note (Signed)
minimize simple carbs. Increase exercise as tolerated.  

## 2018-03-18 NOTE — Patient Instructions (Signed)

## 2018-03-18 NOTE — Assessment & Plan Note (Signed)
Worsening neck pain with lue radicular pain and weakness. Will proceed with xray and consider MRI. Will try increasing Tizanidine to 4 mg doses and continue Tylenol, lidocaine

## 2018-03-18 NOTE — Assessment & Plan Note (Signed)
Encouraged heart healthy diet, increase exercise, avoid trans fats, consider a krill oil cap daily 

## 2018-03-19 LAB — TSH: TSH: 2.58 u[IU]/mL (ref 0.35–4.50)

## 2018-03-23 LAB — PAIN MGMT, PROFILE 8 W/CONF, U
6 Acetylmorphine: NEGATIVE ng/mL (ref <10)
ALCOHOL METABOLITES: NEGATIVE ng/mL (ref <500)
Amphetamines: NEGATIVE ng/mL (ref <500)
BENZODIAZEPINES: NEGATIVE ng/mL (ref <100)
Buprenorphine, Urine: NEGATIVE ng/mL (ref <5)
COCAINE METABOLITE: NEGATIVE ng/mL (ref <150)
CREATININE: 37.5 mg/dL
MDMA: NEGATIVE ng/mL (ref <500)
Marijuana Metabolite: NEGATIVE ng/mL (ref <20)
Opiates: NEGATIVE ng/mL (ref <100)
Oxidant: NEGATIVE ug/mL (ref <200)
Oxycodone: NEGATIVE ng/mL (ref <100)
pH: 6.2 (ref 4.5–9.0)

## 2018-03-29 MED FILL — XARELTO 20 MG TABLET: 20 | 30 days supply | Qty: 30 | Fill #1

## 2018-04-03 ENCOUNTER — Encounter: Payer: Self-pay | Admitting: Family Medicine

## 2018-04-03 ENCOUNTER — Ambulatory Visit: Payer: BLUE CROSS/BLUE SHIELD | Admitting: Family Medicine

## 2018-04-03 VITALS — BP 124/82 | HR 78 | Ht 64.0 in | Wt 242.0 lb

## 2018-04-03 DIAGNOSIS — M545 Low back pain: Secondary | ICD-10-CM

## 2018-04-03 DIAGNOSIS — M7550 Bursitis of unspecified shoulder: Secondary | ICD-10-CM

## 2018-04-03 DIAGNOSIS — M999 Biomechanical lesion, unspecified: Secondary | ICD-10-CM

## 2018-04-03 NOTE — Patient Instructions (Signed)
Good to see you  Alicia Cox is your friend.  Stay active.  Injected the shoulder today  See me again in 4-6 weeks

## 2018-04-03 NOTE — Assessment & Plan Note (Signed)
Injected again today.  Tolerated the procedure well.  Discussed icing regimen and home exercises.  Discussed which activities of doing which wants to avoid.  Patient is to increase activity slowly over the course the next several days.  Follow-up again 4-8 weeks

## 2018-04-03 NOTE — Assessment & Plan Note (Signed)
Decision today to treat with OMT was based on Physical Exam  After verbal consent patient was treated with HVLA, ME, FPR techniques in cervical, thoracic, rib,  lumbar and sacral areas  Patient tolerated the procedure well with improvement in symptoms  Patient given exercises, stretches and lifestyle modifications  See medications in patient instructions if given  Patient will follow up in 4-8 weeks 

## 2018-04-03 NOTE — Progress Notes (Signed)
Alicia ScaleZach Travontae Cox D.O. [facility] [address] [place], [place] [address] Phone: [phone] Subjective:     CC: Left shoulder pain, back pain follow-up  BJY:[id number]:Subjective  Alicia KeyRebecca D Cox is a 47 y.o. female coming in with complaint of left shoulder pain.  Last year patient did have unfortunately frozen shoulder.  Patient was given home exercises and icing regimen.  Patient went did not do very well after injection.  Started having increasing pain.  Waking up at night.  States that it is affecting daily activities such as even dressing.  Rates the severity of pain is 7 out of 10  Back pain and neck pain.  Patient does have mild osteoarthritic changes.  Has done fairly well though with osteopathic manipulation.  Patient is having worsening discomfort and pain.  Feels like she has been compensating for her shoulder recently.  No radiation or any numbness.    Past Medical History:  Diagnosis Date  . Acute bronchitis 11/03/2013  . Allergy    hay fever  . Arthritis   . Blood in stool   . Cervical cancer screening 09/14/2016   Menarche at 13 Regular and heavy flow no history of abnormal pap in past G2P2, s/p 2 svd No history of abnormal MGM No concerns today  gyn surgeries: tubal and endometrial ablation LMP 09/13/2016  . Chest pain 08/24/2013  . Chicken pox as a child  . Dermatitis 03/02/2016  . Frequent headaches   . GERD (gastroesophageal reflux disease)   . Heart murmur   . Hypothyroidism    Hashimoto's  . Knee pain, left 01/09/2014  . Low back pain 08/26/2017  . Migraine   . Obesity 11/15/2014  . Preventative health care 09/12/2015  . TOS (thoracic outlet syndrome) 08/25/2013  . UTI (urinary tract infection)   . Vitamin D deficiency    Past Surgical History:  Procedure Laterality Date  . APPENDECTOMY    . CESAREAN SECTION     twice  . CHOLECYSTECTOMY    . ENDOMETRIAL ABLATION    . TONSILLECTOMY    . TUBAL LIGATION  1999   Social History   Socioeconomic History  .  Marital status: Married    Spouse name: Not on file  . Number of children: Not on file  . Years of education: Not on file  . Highest education level: Not on file  Occupational History  . Not on file  Social Needs  . Financial resource strain: Not on file  . Food insecurity:    Worry: Not on file    Inability: Not on file  . Transportation needs:    Medical: Not on file    Non-medical: Not on file  Tobacco Use  . Smoking status: Never Smoker  . Smokeless tobacco: Never Used  . Tobacco comment: NEVER USED TOBACCO  Substance and Sexual Activity  . Alcohol use: No    Alcohol/week: 0.0 oz  . Drug use: No  . 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 Physical activity:    Days per week: Not on file    Minutes per session: Not on file  . Stress: Not on file  Relationships  . Social connections:    Talks on phone: Not on file    Gets together: Not on file    Attends religious service: Not on file    Active member of club  or organization: Not on file    Attends meetings of clubs or organizations: Not on file    Relationship status: Not on file  Other Topics Concern  . Not on file  Social History Narrative  . Not on file   No Known Allergies Family History  Problem Relation Age of Onset  . Hyperlipidemia Mother   . Hypertension Mother   . Skin cancer Mother   . Diabetes Father        type 2  . Hypertension Sister   . Diabetes Sister   . Asthma Daughter   . Heart disease Maternal Grandmother   . Heart attack Maternal Grandfather   . Diabetes Paternal Grandmother   . Diabetes Paternal Grandfather      Past medical history, social, surgical and family history all reviewed in electronic medical record.  No pertanent information unless stated regarding to the chief complaint.   Review of Systems:Review of systems updated and as accurate as of 04/03/18  No headache, visual  changes, nausea, vomiting, diarrhea, constipation, dizziness, abdominal pain, skin rash, fevers, chills, night sweats, weight loss, swollen lymph nodes, body aches, joint swelling, muscle aches, chest pain, shortness of breath, mood changes.   Objective  There were no vitals taken for this visit. Systems examined below as of 04/03/18   General: No apparent distress alert and oriented x3 mood and affect normal, dressed appropriately.  HEENT: Pupils equal, extraocular movements intact  Respiratory: Patient's speak in full sentences and does not appear short of breath  Cardiovascular: No lower extremity edema, non tender, no erythema  Skin: Warm dry intact with no signs of infection or rash on extremities or on axial skeleton.  Abdomen: Soft nontender  Neuro: Cranial nerves II through XII are intact, neurovascularly intact in all extremities with 2+ DTRs and 2+ pulses.  Lymph: No lymphadenopathy of posterior or anterior cervical chain or axillae bilaterally.  Gait normal with good balance and coordination.  MSK:  Non tender with full range of motion and good stability and symmetric strength and tone of  elbows, wrist, hip, knee and ankles bilaterally.  Shoulder: left Inspection reveals no abnormalities, atrophy or asymmetry. Palpation is normal with no tenderness over AC joint or bicipital groove. ROM is full in all planes passively. Rotator cuff strength normal throughout. signs of impingement with positive Neer and Hawkin's tests, but negative empty can sign. Speeds and Yergason's tests normal. No labral pathology noted with negative Obrien's, negative clunk and good stability. Normal scapular function observed. No painful arc and no drop arm sign. No apprehension sign Contralateral shoulder unremarkable  After informed written and verbal consent, patient was seated on exam table. Left shoulder was prepped with alcohol swab and utilizing posterior approach, patient's right glenohumeral  space was injected with 4:1  marcaine 0.5%: Kenalog 40mg /dL. Patient tolerated the procedure well without immediate complications.  Osteopathic findings C6 flexed rotated and side bent left T4 extended rotated and side bent left inhaled rib T9 extended rotated and side bent left L3 flexed rotated and side bent right Sacrum right on right    Impression and Recommendations:     This case required medical decision making of moderate complexity.      Note: This dictation was prepared with Dragon dictation along with smaller phrase technology. Any transcriptional errors that result from this process are unintentional.

## 2018-04-03 NOTE — Assessment & Plan Note (Signed)
Stable.  Discussed icing regimen and home exercises.  Discussed which activities of doing which wants to avoid.  Patient is to increase activity slowly over the course the next several days.  Follow-up again 4-8 weeks

## 2018-05-01 ENCOUNTER — Telehealth: Payer: Self-pay | Admitting: Hematology & Oncology

## 2018-05-01 ENCOUNTER — Encounter (HOSPITAL_BASED_OUTPATIENT_CLINIC_OR_DEPARTMENT_OTHER): Payer: Self-pay

## 2018-05-01 ENCOUNTER — Ambulatory Visit (HOSPITAL_BASED_OUTPATIENT_CLINIC_OR_DEPARTMENT_OTHER): Payer: Self-pay

## 2018-05-01 ENCOUNTER — Inpatient Hospital Stay: Payer: BLUE CROSS/BLUE SHIELD

## 2018-05-01 ENCOUNTER — Ambulatory Visit: Payer: BLUE CROSS/BLUE SHIELD | Admitting: Hematology & Oncology

## 2018-05-01 NOTE — Telephone Encounter (Signed)
Pt called to r/s 5/15 appts due to insurance issues. Pt r/s appt to 05/24/18 at 145 pm

## 2018-05-06 MED FILL — XARELTO 20 MG TABLET: 20 | 30 days supply | Qty: 30 | Fill #2

## 2018-05-08 ENCOUNTER — Ambulatory Visit: Payer: Self-pay | Admitting: Family Medicine

## 2018-05-08 NOTE — Progress Notes (Deleted)
[name] [address] [place], [place] [address] Phone: [phone] Subjective:    I'm seeing this patient by the request  of:    CC:   [id number]  Alicia Cox is a 47 y.o. female coming in with complaint of ***  Onset-  Location Duration-  Character- Aggravating factors- Reliving factors-  Therapies tried-  Severity-     Past Medical History:  Diagnosis Date  . Acute bronchitis 11/03/2013  . Allergy    hay fever  . Arthritis   . Blood in stool   . Cervical cancer screening 09/14/2016   Menarche at 13 Regular and heavy flow no history of abnormal pap in past G2P2, s/p 2 svd No history of abnormal MGM No concerns today  gyn surgeries: tubal and endometrial ablation LMP 09/13/2016  . Chest pain 08/24/2013  . Chicken pox as a child  . Dermatitis 03/02/2016  . Frequent headaches   . GERD (gastroesophageal reflux disease)   . Heart murmur   . Hypothyroidism    Hashimoto's  . Knee pain, left 01/09/2014  . Low back pain 08/26/2017  . Migraine   . Obesity 11/15/2014  . Preventative health care 09/12/2015  . TOS (thoracic outlet syndrome) 08/25/2013  . UTI (urinary tract infection)   . Vitamin D deficiency    Past Surgical History:  Procedure Laterality Date  . APPENDECTOMY    . CESAREAN SECTION     twice  . CHOLECYSTECTOMY    . ENDOMETRIAL ABLATION    . TONSILLECTOMY    . TUBAL LIGATION  1999   Social History   Socioeconomic History  . Marital status: Married    Spouse name: Not on file  . Number of children: Not on file  . Years of education: Not on file  . Highest education level: Not on file  Occupational History  . Not on file  Social Needs  . Financial resource strain: Not on file  . Food insecurity:    Worry: Not on file    Inability: Not on file  . Transportation needs:    Medical: Not on file    Non-medical: Not on file  Tobacco Use  . Smoking status: Never Smoker  . Smokeless tobacco: Never Used  .  Tobacco comment: NEVER USED TOBACCO  Substance and Sexual Activity  . Alcohol use: No    Alcohol/week: 0.0 oz  . Drug use: No  . 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 Physical activity:    Days per week: Not on file    Minutes per session: Not on file  . Stress: Not on file  Relationships  . Social connections:    Talks on phone: Not on file    Gets together: Not on file    Attends religious service: Not on file    Active member of club or organization: Not on file    Attends meetings of clubs or organizations: Not on file    Relationship status: Not on file  Other Topics Concern  . Not on file  Social History Narrative  . Not on file   No Known Allergies Family History  Problem Relation Age of Onset  . Hyperlipidemia Mother   . Hypertension Mother   . Skin cancer Mother   . Diabetes Father        type 2  .  Hypertension Sister   . Diabetes Sister   . Asthma Daughter   . Heart disease Maternal Grandmother   . Heart attack Maternal Grandfather   . Diabetes Paternal Grandmother   . Diabetes Paternal Grandfather      Past medical history, social, surgical and family history all reviewed in electronic medical record.  No pertanent information unless stated regarding to the chief complaint.   Review of Systems:Review of systems updated and as accurate as of 05/08/18  No headache, visual changes, nausea, vomiting, diarrhea, constipation, dizziness, abdominal pain, skin rash, fevers, chills, night sweats, weight loss, swollen lymph nodes, body aches, joint swelling, muscle aches, chest pain, shortness of breath, mood changes.   Objective  There were no vitals taken for this visit. Systems examined below as of 05/08/18   General: No apparent distress alert and oriented x3 mood and affect normal, dressed appropriately.  HEENT: Pupils equal, extraocular movements intact    Respiratory: Patient's speak in full sentences and does not appear short of breath  Cardiovascular: No lower extremity edema, non tender, no erythema  Skin: Warm dry intact with no signs of infection or rash on extremities or on axial skeleton.  Abdomen: Soft nontender  Neuro: Cranial nerves II through XII are intact, neurovascularly intact in all extremities with 2+ DTRs and 2+ pulses.  Lymph: No lymphadenopathy of posterior or anterior cervical chain or axillae bilaterally.  Gait normal with good balance and coordination.  MSK:  Non tender with full range of motion and good stability and symmetric strength and tone of shoulders, elbows, wrist, hip, knee and ankles bilaterally.     Impression and Recommendations:     This case required medical decision making of moderate complexity.      Note: This dictation was prepared with Dragon dictation along with smaller phrase technology. Any transcriptional errors that result from this process are unintentional.

## 2018-05-24 ENCOUNTER — Inpatient Hospital Stay: Payer: BLUE CROSS/BLUE SHIELD | Attending: Hematology & Oncology

## 2018-05-24 ENCOUNTER — Inpatient Hospital Stay (HOSPITAL_BASED_OUTPATIENT_CLINIC_OR_DEPARTMENT_OTHER): Payer: BLUE CROSS/BLUE SHIELD | Admitting: Hematology & Oncology

## 2018-05-24 ENCOUNTER — Ambulatory Visit (HOSPITAL_BASED_OUTPATIENT_CLINIC_OR_DEPARTMENT_OTHER)
Admission: RE | Admit: 2018-05-24 | Discharge: 2018-05-24 | Disposition: A | Discharge status: Home / Self Care | Payer: BLUE CROSS/BLUE SHIELD | Source: Ambulatory Visit | Attending: Hematology & Oncology | Admitting: Hematology & Oncology

## 2018-05-24 ENCOUNTER — Other Ambulatory Visit: Payer: Self-pay

## 2018-05-24 ENCOUNTER — Encounter: Payer: Self-pay | Admitting: Hematology & Oncology

## 2018-05-24 VITALS — BP 113/55 | HR 94 | Temp 98.6°F | Resp 18 | Wt 248.0 lb

## 2018-05-24 DIAGNOSIS — Z86718 Personal history of other venous thrombosis and embolism: Secondary | ICD-10-CM | POA: Insufficient documentation

## 2018-05-24 DIAGNOSIS — I82401 Acute embolism and thrombosis of unspecified deep veins of right lower extremity: Secondary | ICD-10-CM

## 2018-05-24 DIAGNOSIS — M7989 Other specified soft tissue disorders: Secondary | ICD-10-CM | POA: Diagnosis not present

## 2018-05-24 DIAGNOSIS — Z7901 Long term (current) use of anticoagulants: Secondary | ICD-10-CM | POA: Diagnosis not present

## 2018-05-24 DIAGNOSIS — Z79899 Other long term (current) drug therapy: Secondary | ICD-10-CM

## 2018-05-24 DIAGNOSIS — Z09 Encounter for follow-up examination after completed treatment for conditions other than malignant neoplasm: Secondary | ICD-10-CM | POA: Diagnosis not present

## 2018-05-24 LAB — CBC WITH DIFFERENTIAL (CANCER CENTER ONLY)
Basophils Absolute: 0.1 10*3/uL (ref 0.0–0.1)
Basophils Relative: 1 %
EOS ABS: 0.3 10*3/uL (ref 0.0–0.5)
Eosinophils Relative: 3 %
HEMATOCRIT: 46.1 % (ref 34.8–46.6)
Hemoglobin: 15.6 g/dL (ref 11.6–15.9)
LYMPHS ABS: 2.2 10*3/uL (ref 0.9–3.3)
LYMPHS PCT: 22 %
MCH: 33.3 pg (ref 26.0–34.0)
MCHC: 33.8 g/dL (ref 32.0–36.0)
MCV: 98.5 fL (ref 81.0–101.0)
MONOS PCT: 5 %
Monocytes Absolute: 0.6 10*3/uL (ref 0.1–0.9)
NEUTROS PCT: 69 %
Neutro Abs: 7 10*3/uL — ABNORMAL HIGH (ref 1.5–6.5)
Platelet Count: 321 10*3/uL (ref 145–400)
RBC: 4.68 MIL/uL (ref 3.70–5.32)
RDW: 12.3 % (ref 11.1–15.7)
WBC: 10.1 10*3/uL — AB (ref 3.9–10.0)

## 2018-05-24 LAB — CMP (CANCER CENTER ONLY)
ALT: 27 U/L (ref 0–55)
AST: 19 U/L (ref 5–34)
Albumin: 4 g/dL (ref 3.5–5.0)
Alkaline Phosphatase: 65 U/L (ref 40–150)
Anion gap: 8 (ref 3–11)
BILIRUBIN TOTAL: 0.3 mg/dL (ref 0.2–1.2)
BUN: 13 mg/dL (ref 7–26)
CALCIUM: 9.2 mg/dL (ref 8.4–10.4)
CO2: 24 mmol/L (ref 22–29)
CREATININE: 0.83 mg/dL (ref 0.60–1.10)
Chloride: 105 mmol/L (ref 98–109)
GFR, Estimated: >60 mL/min (ref >60)
GLUCOSE: 161 mg/dL — AB (ref 70–140)
Potassium: 3.9 mmol/L (ref 3.5–5.1)
SODIUM: 137 mmol/L (ref 136–145)
TOTAL PROTEIN: 7.9 g/dL (ref 6.4–8.3)

## 2018-05-24 MED ORDER — RIVAROXABAN 10 MG PO TABS: 10.0000 mg | ORAL_TABLET | Freq: Every day | ORAL | 12 refills | Status: DC

## 2018-05-24 MED FILL — ESCITALOPRAM 10 MG TABLET: 10 | 90 days supply | Qty: 90 | Fill #1

## 2018-05-24 MED FILL — tiZANidine HCL 4 MG TABS: 4 | 13 days supply | Qty: 40 | Fill #1

## 2018-05-24 MED FILL — GABAPENTIN 300 MG CAPSULE: 300 | 90 days supply | Qty: 90 | Fill #0

## 2018-05-24 NOTE — Progress Notes (Signed)
Hematology and Oncology Follow Up Visit  Alicia Cox [id number] 1971-01-31 47 y.o. 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 daily-to complete 6 months of therapy on 05/24/2018  Xarelto 10 mg p.o. daily-start on 05/24/2018     Interim History:  Alicia Cox is back for follow-up.  This is her second office visit.  We first saw her in March.  At that time, she had presented with a new thrombus in the right posterior tibial vein.  She previously had had a thrombus in the left leg.  This was back in May 2016.  We did go ahead and get a Doppler of her legs.  This was done on 05/24/2018.  Thankfully, the Doppler did not show any residual thrombus in either leg.  We did do a hypercoagulable panel on her.  Everything was negative for thrombophilia.  She and her husband just got back from a cruise.  They had a wonderful time.  She is has some swelling in the left leg.  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  However, this does not really bother her.  She is had no bleeding.  She does have occasional monthly cycles.  She has had uterine ablation in the past.  She has had no cough or chest wall pain.  Medications:  Current Outpatient Medications:  .  ALPRAZolam (XANAX) 0.25 MG tablet, Take 1 tablet (0.25 mg total) by mouth 2 (two) times daily as needed for anxiety., Disp: 20 tablet, Rfl: 0 .  cholecalciferol (VITAMIN D) 1000 UNITS tablet, Take 10,000 Units by mouth once a week., Disp: , Rfl:  .  Diclofenac Sodium (PENNSAID) 2 % SOLN, Place 2 application onto the skin 2 (two) times daily., Disp: 112 g, Rfl: 3 .  escitalopram (LEXAPRO) 10 MG tablet, TAKE 1 TABLET (10 MG TOTAL) BY MOUTH DAILY., Disp: 90 tablet, Rfl: 1 .  gabapentin (NEURONTIN) 300 MG capsule,  nightly, Disp: 90 capsule, Rfl: 3 .  levothyroxine (SYNTHROID, LEVOTHROID) 100 MCG tablet, TAKE ONE TABLET BY MOUTH ONCE DAILY EXCEPT  TAKE  ONE  AND  ONE-HALF  TABLETS  ON  THURSDAYS  AND  SUNDAYS, Disp: 105 tablet, Rfl: 3 .  montelukast (SINGULAIR) 10 MG tablet, Take 1 tablet (10 mg total) by mouth at bedtime., Disp: 90 tablet, Rfl: 1 .  nystatin cream (MYCOSTATIN), Apply 1 application topically 2 (two) times daily., Disp: 30 g, Rfl: 1 .  ranitidine (ZANTAC) 300 MG tablet, Take 1 tablet (300 mg total) by mouth at bedtime., Disp: 30 tablet, Rfl: 6 .  rivaroxaban (XARELTO) 10 MG TABS tablet, Take 1 tablet (10 mg total) by mouth daily., Disp: 30 tablet, Rfl: 12 .  tiZANidine (ZANAFLEX) 4 MG tablet, Take 0.5-1 tablets (2-4 mg total) by mouth every 8 (eight) hours as needed for muscle spasms., Disp: 40 tablet, Rfl: 2 .  triamcinolone cream (KENALOG) 0.1 %, Apply 1 application topically 2 (two) times daily as needed., Disp: 30 g, Rfl: 0 .  VENTOLIN HFA 108 (90 Base) MCG/ACT inhaler, INHALE 2 PUFFS BY MOUTH INTO THE LUNGS EVERY 6 (SIX) HOURS AS NEEDED FOR WHEEZING OR SHORTNESS OF BREATH., Disp: 18 g, Rfl: 0 .  Vitamin D, Ergocalciferol, (DRISDOL) 50000 units CAPS capsule, Take 1 capsule (50,000 Units total) by mouth every 7 (  seven) days., Disp: 12 capsule, Rfl: 0  Allergies: No Known Allergies  Past Medical History, Surgical history, Social history, and Family History were reviewed and updated.  Review of Systems: Review of Systems  Constitutional: Negative.   HENT:  Negative.   Eyes: Negative.   Respiratory: Negative.   Cardiovascular: Negative.   Gastrointestinal: Negative.   Endocrine: Negative.   Genitourinary: Negative.    Musculoskeletal: Negative.   Skin: Negative.   Neurological: Negative.   Hematological: Negative.   Psychiatric/Behavioral: Negative.     Physical Exam:  weight is 248 lb (112.5 kg). Her oral temperature is 98.6 F (37 C). Her blood pressure is 113/55 (abnormal)  and her pulse is 94. Her respiration is 18 and oxygen saturation is 97%.   Wt Readings from Last 3 Encounters:  05/24/18 248 lb (112.5 kg)  04/03/18 242 lb (109.8 kg)  03/06/18 238 lb (108 kg)    Physical Exam  Constitutional: She is oriented to person, place, and time.  HENT:  Head: Normocephalic and atraumatic.  Mouth/Throat: Oropharynx is clear and moist.  Eyes: Pupils are equal, round, and reactive to light. EOM are normal.  Neck: Normal range of motion.  Cardiovascular: Normal rate, regular rhythm and normal heart sounds.  Pulmonary/Chest: Effort normal and breath sounds normal.  Abdominal: Soft. Bowel sounds are normal.  Musculoskeletal: Normal range of motion. She exhibits no edema, tenderness or deformity.  Lymphadenopathy:    She has no cervical adenopathy.  Neurological: She is alert and oriented to person, place, and time.  Skin: Skin is warm and dry. No rash noted. No erythema.  Psychiatric: She has a normal mood and affect. Her behavior is normal. Judgment and thought content normal.  Vitals reviewed.    Lab Results  Component Value Date   WBC 10.1 (H) 05/24/2018   HGB 15.6 05/24/2018   HCT 46.1 05/24/2018   MCV 98.5 05/24/2018   PLT 321 05/24/2018     Chemistry      Component Value Date/Time   NA 141 02/20/2018 1402   K 3.3 02/20/2018 1402   CL 103 02/20/2018 1402   CO2 29 02/20/2018 1402   BUN 9 02/20/2018 1402   CREATININE 0.80 02/20/2018 1402   CREATININE 0.66 08/14/2014 1029      Component Value Date/Time   CALCIUM 8.5 02/20/2018 1402   ALKPHOS 59 02/20/2018 1402   AST 26 02/20/2018 1402   ALT 38 02/20/2018 1402   BILITOT 0.5 02/20/2018 1402      Impression and Plan: Alicia Cox is a 47 year old white female.  She had a thrombus in the left leg back in 2016.  She now has a new thrombus in the right leg.  Thankfully, the Doppler shows that she does not have any active thrombus in either leg.  Her situation is little bit complex.   Unfortunately, we might have to consider her for lifelong anticoagulation.  Given that she has had 2 separate thrombotic events within a 5-year period, I would be worried that she does have some thrombophilic condition that we just have not found.  I would like to see her back in 6 months.  We will put her on the 10 mg daily dose.  We may have to keep her on low-dose Xarelto as a "maintenance" for life.  I really need to make sure that she is not Factor V Leiden mutated or Prothrombin II gene mutation positive.  As always, she is fine to talk to.   Josph Macho,  MD 6/7/20193:18 PM

## 2018-05-25 NOTE — Progress Notes (Signed)
Alicia ScaleZach Yoselyn Cox D.O. [facility] [address] [place], [place] [address] Phone: [phone] Subjective:     CC: Back pain and left shoulder pain follow-up  QMV:[id number]:Subjective  Alicia KeyRebecca D Cox is a 47 y.o. female coming in with complaint of left shoulder pain.  Seen multiple times.  Possible frozen shoulder.  Worsening symptoms again.  Rates the severity pain is 7 out of 10.  Patient states waking up at night.  Last injection was only 4 months ago.  Patient is also having some back and neck pain.  Has been seen multiple times.  Has responded very well to osteopathic manipulation.  Having some increasing tightness.  Been sitting in writing more.  Was taking an art class and thinks that the ergonomics cause some discomfort and pain.     Past Medical History:  Diagnosis Date  . Acute bronchitis 11/03/2013  . Allergy    hay fever  . Arthritis   . Blood in stool   . Cervical cancer screening 09/14/2016   Menarche at 13 Regular and heavy flow no history of abnormal pap in past G2P2, s/p 2 svd No history of abnormal MGM No concerns today  gyn surgeries: tubal and endometrial ablation LMP 09/13/2016  . Chest pain 08/24/2013  . Chicken pox as a child  . Dermatitis 03/02/2016  . Frequent headaches   . GERD (gastroesophageal reflux disease)   . Heart murmur   . Hypothyroidism    Hashimoto's  . Knee pain, left 01/09/2014  . Low back pain 08/26/2017  . Migraine   . Obesity 11/15/2014  . Preventative health care 09/12/2015  . TOS (thoracic outlet syndrome) 08/25/2013  . UTI (urinary tract infection)   . Vitamin D deficiency    Past Surgical History:  Procedure Laterality Date  . APPENDECTOMY    . CESAREAN SECTION     twice  . CHOLECYSTECTOMY    . ENDOMETRIAL ABLATION    . TONSILLECTOMY    . TUBAL LIGATION  1999   Social History   Socioeconomic History  . Marital status: Married    Spouse name: Not on file  . Number of children: Not on file  . Years of education: Not on file  .  Highest education level: Not on file  Occupational History  . Not on file  Social Needs  . Financial resource strain: Not on file  . Food insecurity:    Worry: Not on file    Inability: Not on file  . Transportation needs:    Medical: Not on file    Non-medical: Not on file  Tobacco Use  . Smoking status: Never Smoker  . Smokeless tobacco: Never Used  . Tobacco comment: NEVER USED TOBACCO  Substance and Sexual Activity  . Alcohol use: No    Alcohol/week: 0.0 oz  . Drug use: No  . 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 Physical activity:    Days per week: Not on file    Minutes per session: Not on file  . Stress: Not on file  Relationships  . Social connections:    Talks on phone: Not on file    Gets together: Not on file    Attends religious service: Not on file    Active member of club or organization: Not on file    Attends meetings of clubs or organizations: Not on file  Relationship status: Not on file  Other Topics Concern  . Not on file  Social History Narrative  . Not on file   No Known Allergies Family History  Problem Relation Age of Onset  . Hyperlipidemia Mother   . Hypertension Mother   . Skin cancer Mother   . Diabetes Father        type 2  . Hypertension Sister   . Diabetes Sister   . Asthma Daughter   . Heart disease Maternal Grandmother   . Heart attack Maternal Grandfather   . Diabetes Paternal Grandmother   . Diabetes Paternal Grandfather      Past medical history, social, surgical and family history all reviewed in electronic medical record.  No pertanent information unless stated regarding to the chief complaint.   Review of Systems:Review of systems updated and as accurate as of 05/25/18  No headache, visual changes, nausea, vomiting, diarrhea, constipation, dizziness, abdominal pain, skin rash, fevers, chills, night sweats, weight  loss, swollen lymph nodes, body aches, joint swelling, chest pain, shortness of breath, mood changes.  Positive muscle aches  Objective  There were no vitals taken for this visit. Systems examined below as of 05/25/18   General: No apparent distress alert and oriented x3 mood and affect normal, dressed appropriately.  HEENT: Pupils equal, extraocular movements intact  Respiratory: Patient's speak in full sentences and does not appear short of breath  Cardiovascular: No lower extremity edema, non tender, no erythema  Skin: Warm dry intact with no signs of infection or rash on extremities or on axial skeleton.  Abdomen: Soft nontender  Neuro: Cranial nerves II through XII are intact, neurovascularly intact in all extremities with 2+ DTRs and 2+ pulses.  Lymph: No lymphadenopathy of posterior or anterior cervical chain or axillae bilaterally.  Gait normal with good balance and coordination.  MSK:  Non tender with full range of motion and good stability and symmetric strength and tone of  elbows, wrist, hip, knee and ankles bilaterally.  Patient left shoulder shows positive impingement.  Positive O'Brien's.  Positive crossover test.  Strength of the rotator cuff though is 5 out of 5 and symmetric compared to contralateral side  Back exam shows loss of lordosis.  Poor core strength.  Patient does have a positive Pearlean Brownie on the right side.  Negative straight leg test.  Tightness in the cervical thoracic lumbar region as well.  No radicular symptoms with Spurling's.  Osteopathic findings C2 flexed rotated and side bent right C4 flexed rotated and side bent left C6 flexed rotated and side bent left T3 extended rotated and side bent right inhaled third rib T9 extended rotated and side bent left L2 flexed rotated and side bent right Sacrum right on right    Impression and Recommendations:     This case required medical decision making of moderate complexity.      Note: This dictation was  prepared with Dragon dictation along with smaller phrase technology. Any transcriptional errors that result from this process are unintentional.

## 2018-05-27 ENCOUNTER — Encounter: Payer: Self-pay | Admitting: Family Medicine

## 2018-05-27 ENCOUNTER — Ambulatory Visit: Payer: BLUE CROSS/BLUE SHIELD | Admitting: Family Medicine

## 2018-05-27 VITALS — BP 138/90 | HR 69 | Ht 64.0 in | Wt 252.0 lb

## 2018-05-27 DIAGNOSIS — G8929 Other chronic pain: Secondary | ICD-10-CM | POA: Diagnosis not present

## 2018-05-27 DIAGNOSIS — M7502 Adhesive capsulitis of left shoulder: Secondary | ICD-10-CM

## 2018-05-27 DIAGNOSIS — M999 Biomechanical lesion, unspecified: Secondary | ICD-10-CM

## 2018-05-27 DIAGNOSIS — M25512 Pain in left shoulder: Secondary | ICD-10-CM

## 2018-05-27 NOTE — Assessment & Plan Note (Signed)
Failed all conservative therapy, been going on more then 18 months, MRI ordered. MR-arthrogram r/o labral.  RTC after imaging.

## 2018-05-27 NOTE — Patient Instructions (Signed)
Good to see you  Alicia Cox is your friend.  We will get MRi of the left shoulder  I will write you when I get results The back still has some work left See me again in 4-6 weeks

## 2018-05-27 NOTE — Assessment & Plan Note (Signed)
Decision today to treat with OMT was based on Physical Exam  After verbal consent patient was treated with HVLA, ME, FPR techniques in cervical, thoracic, rib, lumbar and sacral areas  Patient tolerated the procedure well with improvement in symptoms  Patient given exercises, stretches and lifestyle modifications  See medications in patient instructions if given  Patient will follow up in 4-6 weeks 

## 2018-06-05 ENCOUNTER — Encounter: Payer: Self-pay | Admitting: Hematology & Oncology

## 2018-06-05 ENCOUNTER — Other Ambulatory Visit: Payer: Self-pay | Admitting: *Deleted

## 2018-06-05 MED ORDER — RIVAROXABAN 10 MG PO TABS: 10.0000 mg | ORAL_TABLET | Freq: Every day | ORAL | 12 refills | Status: DC

## 2018-06-05 MED FILL — XARELTO 10 MG TABLET: 10 | 30 days supply | Qty: 30 | Fill #0

## 2018-06-10 ENCOUNTER — Ambulatory Visit
Admission: RE | Admit: 2018-06-10 | Discharge: 2018-06-10 | Disposition: A | Discharge status: Home / Self Care | Payer: BLUE CROSS/BLUE SHIELD | Source: Ambulatory Visit | Attending: Family Medicine | Admitting: Family Medicine

## 2018-06-10 DIAGNOSIS — M25512 Pain in left shoulder: Principal | ICD-10-CM

## 2018-06-10 DIAGNOSIS — G8929 Other chronic pain: Secondary | ICD-10-CM

## 2018-06-10 MED ORDER — IOPAMIDOL (ISOVUE-M 200) INJECTION 41%
18.0000 mL | Freq: Once | INTRAMUSCULAR | Status: AC
  Administered 2018-06-10: 18 mL via INTRA_ARTICULAR

## 2018-06-30 NOTE — Progress Notes (Signed)
[name] [name] [place], [place] [address] Phone: [phone] Subjective:    I'm seeing this patient by the request  of:    CC: Left arm pain follow-up  BJY:[id number]  Alicia Cox is a 47 y.o. female coming in with complaint of left shoulder and left knee pain. Continues to have pain in her arm down into the elbow.  Patient was seen previously and did have more of a Tatar cuff tear that was concerning for labral tear.  Sent for an MRI.  Independently visualized by me showing only mild intrasubstance tearing noted with no significant retraction.  Continues to have pain and has had 3 injections in the last 10 months.  Wants to avoid any more steroids if possible.  Has done formal physical therapy and have attempted manipulation with minimal improvement      Past Medical History:  Diagnosis Date  . Acute bronchitis 11/03/2013  . Allergy    hay fever  . Arthritis   . Blood in stool   . Cervical cancer screening 09/14/2016   Menarche at 13 Regular and heavy flow no history of abnormal pap in past G2P2, s/p 2 svd No history of abnormal MGM No concerns today  gyn surgeries: tubal and endometrial ablation LMP 09/13/2016  . Chest pain 08/24/2013  . Chicken pox as a child  . Dermatitis 03/02/2016  . Frequent headaches   . GERD (gastroesophageal reflux disease)   . Heart murmur   . Hypothyroidism    Hashimoto's  . Knee pain, left 01/09/2014  . Low back pain 08/26/2017  . Migraine   . Obesity 11/15/2014  . Preventative health care 09/12/2015  . TOS (thoracic outlet syndrome) 08/25/2013  . UTI (urinary tract infection)   . Vitamin D deficiency    Past Surgical History:  Procedure Laterality Date  . APPENDECTOMY    . CESAREAN SECTION     twice  . CHOLECYSTECTOMY    . ENDOMETRIAL ABLATION    . TONSILLECTOMY    . TUBAL LIGATION  1999   Social History   Socioeconomic History  . Marital status: Married    Spouse name: Not on file  . Number  of children: Not on file  . Years of education: Not on file  . Highest education level: Not on file  Occupational History  . Not on file  Social Needs  . Financial resource strain: Not on file  . Food insecurity:    Worry: Not on file    Inability: Not on file  . Transportation needs:    Medical: Not on file    Non-medical: Not on file  Tobacco Use  . Smoking status: Never Smoker  . Smokeless tobacco: Never Used  . Tobacco comment: NEVER USED TOBACCO  Substance and Sexual Activity  . Alcohol use: No    Alcohol/week: 0.0 oz  . Drug use: No  . 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 Physical activity:    Days per week: Not on file    Minutes per session: Not on file  . Stress: Not on file  Relationships  . Social connections:    Talks on phone: Not on file    Gets together: Not on file    Attends religious service: Not on file    Active member of club or organization: Not on file  Attends meetings of clubs or organizations: Not on file    Relationship status: Not on file  Other Topics Concern  . Not on file  Social History Narrative  . Not on file   No Known Allergies Family History  Problem Relation Age of Onset  . Hyperlipidemia Mother   . Hypertension Mother   . Skin cancer Mother   . Diabetes Father        type 2  . Hypertension Sister   . Diabetes Sister   . Asthma Daughter   . Heart disease Maternal Grandmother   . Heart attack Maternal Grandfather   . Diabetes Paternal Grandmother   . Diabetes Paternal Grandfather      Past medical history, social, surgical and family history all reviewed in electronic medical record.  No pertanent information unless stated regarding to the chief complaint.   Review of Systems:Review of systems updated and as accurate as of 06/30/18  No headache, visual changes, nausea, vomiting, diarrhea, constipation, dizziness,  abdominal pain, skin rash, fevers, chills, night sweats, weight loss, swollen lymph nodes, body aches, joint swelling, muscle aches, chest pain, shortness of breath, mood changes.   Objective  There were no vitals taken for this visit. Systems examined below as of 06/30/18   General: No apparent distress alert and oriented x3 mood and affect normal, dressed appropriately.  HEENT: Pupils equal, extraocular movements intact  Respiratory: Patient's speak in full sentences and does not appear short of breath  Cardiovascular: No lower extremity edema, non tender, no erythema  Skin: Warm dry intact with no signs of infection or rash on extremities or on axial skeleton.  Abdomen: Soft nontender  Neuro: Cranial nerves II through XII are intact, neurovascularly intact in all extremities with 2+ DTRs and 2+ pulses.  Lymph: No lymphadenopathy of posterior or anterior cervical chain or axillae bilaterally.  Gait normal with good balance and coordination.  MSK:  Non tender with full range of motion and good stability and symmetric strength and tone of  elbows, wrist, hip, knee and ankles bilaterally.  Shoulder: left Inspection reveals no abnormalities, atrophy or asymmetry. Palpation is normal with no tenderness over AC joint or bicipital groove. ROM is full in all planes passively. Rotator cuff strength 4+ out of 5 strength signs of impingement with positive Neer and Hawkin's tests, but negative empty can sign. Speeds and Yergason's tests normal. No labral pathology noted with negative Obrien's, negative clunk and good stability. Normal scapular function observed. No painful arc and no drop arm sign. No apprehension sign Contralateral shoulder unremarkable  Impression and Recommendations:     This case required medical decision making of moderate complexity.      Note: This dictation was prepared with Dragon dictation along with smaller phrase technology. Any transcriptional errors that  result from this process are unintentional.

## 2018-07-01 ENCOUNTER — Encounter: Payer: Self-pay | Admitting: Family Medicine

## 2018-07-01 ENCOUNTER — Ambulatory Visit: Payer: BLUE CROSS/BLUE SHIELD | Admitting: Family Medicine

## 2018-07-01 DIAGNOSIS — M75112 Incomplete rotator cuff tear or rupture of left shoulder, not specified as traumatic: Secondary | ICD-10-CM | POA: Diagnosis not present

## 2018-07-01 DIAGNOSIS — M75102 Unspecified rotator cuff tear or rupture of left shoulder, not specified as traumatic: Secondary | ICD-10-CM | POA: Insufficient documentation

## 2018-07-01 MED ORDER — NITROGLYCERIN 0.2 MG/HR TD PT24: MEDICATED_PATCH | TRANSDERMAL | 1 refills | Status: DC

## 2018-07-01 NOTE — Assessment & Plan Note (Signed)
Patient has intrasubstance tearing noted.  Discussed with patient in great length.  Has not responded well to the injections.  Started on nitroglycerin low-dose and warned of potential side effects.  Patient wanted to try this instead of the PRP.  Handout given for this as well.  Has done formal physical therapy.  Discussed proper lifting mechanics.  Encourage patient to do the home exercise.  Follow-up again in 4 to 6 weeks

## 2018-07-01 NOTE — Patient Instructions (Addendum)
Good to see you  Ice 20 minutes 2 times daily. Usually after activity and before bed. Nitroglycerin Protocol   Apply 1/4 nitroglycerin patch to affected area daily.  Change position of patch within the affected area every 24 hours.  You may experience a headache during the first 1-2 weeks of using the patch, these should subside.  If you experience headaches after beginning nitroglycerin patch treatment, you may take your preferred over the counter pain reliever.  Another side effect of the nitroglycerin patch is skin irritation or rash related to patch adhesive.  Please notify our office if you develop more severe headaches or rash, and stop the patch.  Tendon healing with nitroglycerin patch may require 12 to 24 weeks depending on the extent of injury.  Men should not use if taking Viagra, Cialis, or Levitra.   Do not use if you have migraines or rosacea.  See me again in 6 weeks consider PRP

## 2018-07-05 MED FILL — XARELTO 10 MG TABLET: 10 | 30 days supply | Qty: 30 | Fill #1

## 2018-08-13 NOTE — Progress Notes (Signed)
[name] [address] [place], [place] [address] Phone: [phone] Subjective:     CC: Neck and left shoulder pain  BJY:NWGNFAOZHY  Alicia Cox is a 47 y.o. female coming in with complaint of left shoulder pain.  Patient has had a small intrasubstance tear of the rotator cuff.  Has failed all conservative therapy including formal physical therapy, home exercises, multiple injections.  Patient states that the pain is unrelenting but now is having radiation down the arm as well as some neck pain.  Has had neck pain associated with this before but now seems to be worsening.  Patient did have neck x-rays that showed mild osteoarthritic changes.  This was also independently visualized by me.  Patient states that it is causing more pain at night and affecting daily activities.     Past Medical History:  Diagnosis Date  . Acute bronchitis 11/03/2013  . Allergy    hay fever  . Arthritis   . Blood in stool   . Cervical cancer screening 09/14/2016   Menarche at 13 Regular and heavy flow no history of abnormal pap in past G2P2, s/p 2 svd No history of abnormal MGM No concerns today  gyn surgeries: tubal and endometrial ablation LMP 09/13/2016  . Chest pain 08/24/2013  . Chicken pox as a child  . Dermatitis 03/02/2016  . Frequent headaches   . GERD (gastroesophageal reflux disease)   . Heart murmur   . Hypothyroidism    Hashimoto's  . Knee pain, left 01/09/2014  . Low back pain 08/26/2017  . Migraine   . Obesity 11/15/2014  . Preventative health care 09/12/2015  . TOS (thoracic outlet syndrome) 08/25/2013  . UTI (urinary tract infection)   . Vitamin D deficiency    Past Surgical History:  Procedure Laterality Date  . APPENDECTOMY    . CESAREAN SECTION     twice  . CHOLECYSTECTOMY    . ENDOMETRIAL ABLATION    . TONSILLECTOMY    . TUBAL LIGATION  1999   Social History   Socioeconomic History  . Marital status: Married    Spouse name: Not on file    . Number of children: Not on file  . Years of education: Not on file  . Highest education level: Not on file  Occupational History  . Not on file  Social Needs  . Financial resource strain: Not on file  . Food insecurity:    Worry: Not on file    Inability: Not on file  . Transportation needs:    Medical: Not on file    Non-medical: Not on file  Tobacco Use  . Smoking status: Never Smoker  . Smokeless tobacco: Never Used  . Tobacco comment: NEVER USED TOBACCO  Substance and Sexual Activity  . Alcohol use: No    Alcohol/week: 0.0 standard drinks  . Drug use: No  . 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 Physical activity:    Days per week: Not on file    Minutes per session: Not on file  . Stress: Not on file  Relationships  . Social connections:    Talks on phone: Not on file    Gets together: Not on file    Attends religious service: Not on file    Active member of club or organization: Not on file    Attends  meetings of clubs or organizations: Not on file    Relationship status: Not on file  Other Topics Concern  . Not on file  Social History Narrative  . Not on file   No Known Allergies Family History  Problem Relation Age of Onset  . Hyperlipidemia Mother   . Hypertension Mother   . Skin cancer Mother   . Diabetes Father        type 2  . Hypertension Sister   . Diabetes Sister   . Asthma Daughter   . Heart disease Maternal Grandmother   . Heart attack Maternal Grandfather   . Diabetes Paternal Grandmother   . Diabetes Paternal Grandfather     Current Outpatient Medications (Endocrine & Metabolic):  .  levothyroxine (SYNTHROID, LEVOTHROID) 100 MCG tablet, TAKE ONE TABLET BY MOUTH ONCE DAILY EXCEPT  TAKE  ONE  AND  ONE-HALF  TABLETS  ON  THURSDAYS  AND  SUNDAYS  Current Outpatient Medications (Cardiovascular):  .  nitroGLYCERIN (NITRODUR - DOSED IN  MG/24 HR) 0.2 mg/hr patch, 1/4 patch daily  Current Outpatient Medications (Respiratory):  .  montelukast (SINGULAIR) 10 MG tablet, Take 1 tablet (10 mg total) by mouth at bedtime. .  VENTOLIN HFA 108 (90 Base) MCG/ACT inhaler, INHALE 2 PUFFS BY MOUTH INTO THE LUNGS EVERY 6 (SIX) HOURS AS NEEDED FOR WHEEZING OR SHORTNESS OF BREATH.   Current Outpatient Medications (Hematological):  .  rivaroxaban (XARELTO) 10 MG TABS tablet, Take 1 tablet (10 mg total) by mouth daily.  Current Outpatient Medications (Other):  Marland Kitchen.  ALPRAZolam (XANAX) 0.25 MG tablet, Take 1 tablet (0.25 mg total) by mouth 2 (two) times daily as needed for anxiety. .  Diclofenac Sodium (PENNSAID) 2 % SOLN, Place 2 application onto the skin 2 (two) times daily. Marland Kitchen.  escitalopram (LEXAPRO) 10 MG tablet, TAKE 1 TABLET (10 MG TOTAL) BY MOUTH DAILY. Marland Kitchen.  gabapentin (NEURONTIN) 300 MG capsule, nightly .  tiZANidine (ZANAFLEX) 4 MG tablet, Take 0.5-1 tablets (2-4 mg total) by mouth every 8 (eight) hours as needed for muscle spasms.    Past medical history, social, surgical and family history all reviewed in electronic medical record.  No pertanent information unless stated regarding to the chief complaint.   Review of Systems:  No headache, visual changes, nausea, vomiting, diarrhea, constipation, dizziness, abdominal pain, skin rash, fevers, chills, night sweats, weight loss, swollen lymph nodes, body aches, joint swelling,  chest pain, shortness of breath, mood changes.  Positive muscle aches  Objective  Blood pressure 118/84, pulse 94, height 5\' 4"  (1.626 m), weight 253 lb (114.8 kg), SpO2 97 %. Systems examined below as of    General: No apparent distress alert and oriented x3 mood and affect normal, dressed appropriately.  HEENT: Pupils equal, extraocular movements intact  Respiratory: Patient's speak in full sentences and does not appear short of breath  Cardiovascular: No lower extremity edema, non tender, no erythema  Skin:  Warm dry intact with no signs of infection or rash on extremities or on axial skeleton.  Abdomen: Soft nontender  Neuro: Cranial nerves II through XII are intact, neurovascularly intact in all extremities with  and 2+ pulses.  Lymph: No lymphadenopathy of posterior or anterior cervical chain or axillae bilaterally.  Gait normal with good balance and coordination.  MSK:  Non tender with full range of motion and good stability and symmetric strength and tone of elbows, wrist, hip, knee and ankles bilaterally.  Shoulder: Left Inspection reveals no abnormalities, atrophy or asymmetry. Palpation  is normal with no tenderness over AC joint or bicipital groove.  Lacks the last 5 degrees of internal and external range of motion Rotator cuff strength 4 out of 5 compared to the contralateral side Positive impingement Speeds and Yergason's tests normal. No labral pathology noted with negative Obrien's, negative clunk and good stability. Normal scapular function observed. No painful arc and no drop arm sign. No apprehension sign Contralateral shoulder unremarkable  Neck: Inspection loss of lordosis. No palpable stepoffs. Positive Spurling's maneuver. Full neck range of motion Weakness noted though in the C7 distribution on the left side Negative Hoffman sign bilaterally Reflexes normal     Impression and Recommendations:     This case required medical decision making of moderate complexity. The above documentation has been reviewed and is accurate and complete Judi Saa, DO       Note: This dictation was prepared with Dragon dictation along with smaller phrase technology. Any transcriptional errors that result from this process are unintentional.

## 2018-08-14 ENCOUNTER — Encounter: Payer: Self-pay | Admitting: Family Medicine

## 2018-08-14 ENCOUNTER — Ambulatory Visit: Payer: BLUE CROSS/BLUE SHIELD | Admitting: Family Medicine

## 2018-08-14 ENCOUNTER — Other Ambulatory Visit: Payer: Self-pay | Admitting: Family Medicine

## 2018-08-14 VITALS — BP 118/84 | HR 94 | Ht 64.0 in | Wt 253.0 lb

## 2018-08-14 DIAGNOSIS — M25512 Pain in left shoulder: Secondary | ICD-10-CM | POA: Diagnosis not present

## 2018-08-14 DIAGNOSIS — M5412 Radiculopathy, cervical region: Secondary | ICD-10-CM | POA: Diagnosis not present

## 2018-08-14 DIAGNOSIS — M75112 Incomplete rotator cuff tear or rupture of left shoulder, not specified as traumatic: Secondary | ICD-10-CM | POA: Diagnosis not present

## 2018-08-14 DIAGNOSIS — M542 Cervicalgia: Secondary | ICD-10-CM

## 2018-08-14 MED FILL — XARELTO 10 MG TABLET: 10 | 30 days supply | Qty: 30 | Fill #2

## 2018-08-14 NOTE — Assessment & Plan Note (Signed)
Intrasubstance tearing noted.  Patient has failed all conservative therapy.  Has had this pain for multiple months at this time.  Does not seem to be resolving.  Patient would consider surgical intervention and will be referred to discuss the possibility of this.

## 2018-08-14 NOTE — Patient Instructions (Signed)
Good to see you  Dr. Ave Filterhandler with [facility] will call you and discuss option for the shoulder  We will get MRI of the neck as well and I will write you with the results.  Depending on findings we will discuss next steps

## 2018-08-14 NOTE — Assessment & Plan Note (Signed)
Patient has had tightness of the neck but never any radicular symptoms.  Now patient has a positive Spurling's test with some mild weakness noted in the C7 distribution.  Concern for partial herniated disc.  Before patient undergoes any surgical intervention for the shoulder I would like an MRI of the neck to further evaluate if any of the neurologic component could be potentially contributing.  Patient will follow-up again in 4 weeks

## 2018-08-26 ENCOUNTER — Ambulatory Visit
Admission: RE | Admit: 2018-08-26 | Discharge: 2018-08-26 | Disposition: A | Discharge status: Home / Self Care | Payer: BLUE CROSS/BLUE SHIELD | Source: Ambulatory Visit | Attending: Family Medicine | Admitting: Family Medicine

## 2018-08-26 DIAGNOSIS — M542 Cervicalgia: Secondary | ICD-10-CM

## 2018-08-27 ENCOUNTER — Encounter: Payer: Self-pay | Admitting: Family Medicine

## 2018-08-27 DIAGNOSIS — M5412 Radiculopathy, cervical region: Secondary | ICD-10-CM

## 2018-09-04 ENCOUNTER — Other Ambulatory Visit: Payer: Self-pay | Admitting: Family Medicine

## 2018-09-04 MED FILL — MONTELUKAST SOD 10 MG TAB: 10 | 90 days supply | Qty: 90 | Fill #0

## 2018-09-04 MED FILL — ESCITALOPRAM 10 MG TABLET: 10 | 90 days supply | Qty: 90 | Fill #0

## 2018-09-05 IMAGING — DX DG CERVICAL SPINE COMPLETE 4+V
6 series · 6 of 6 positions shown · non-contrast
Comparison: 12/21/2010 .

CLINICAL DATA: Left shoulder pain.  No known injury.

EXAM:
CERVICAL SPINE - COMPLETE 4+ VIEW

[c-spine lat]
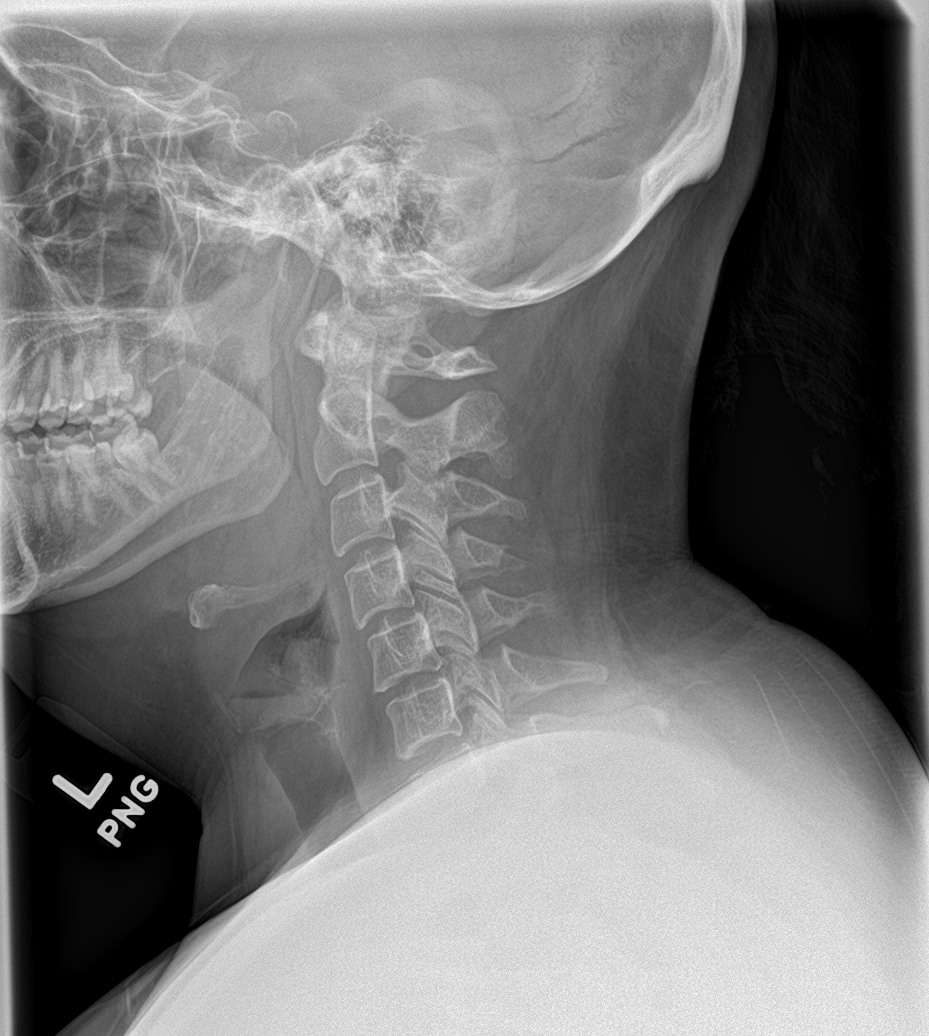

[c-spine obl (1 of 2)]
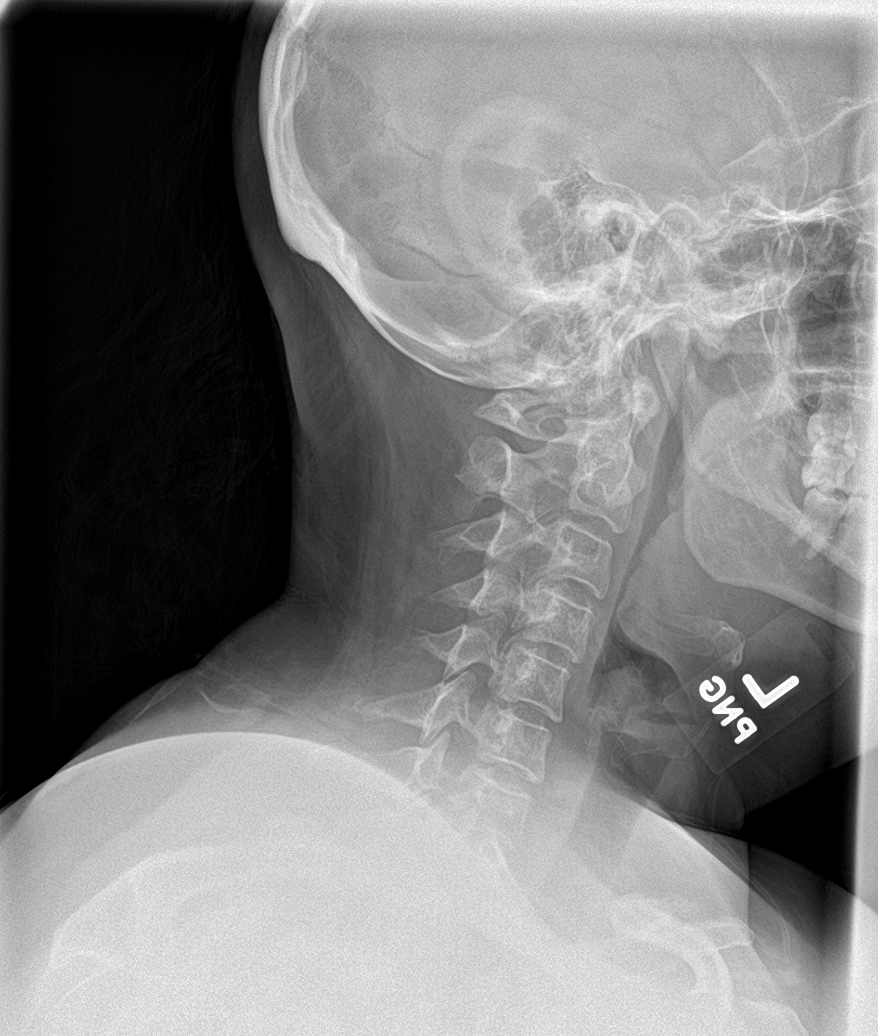

[c-spine obl (2 of 2)]
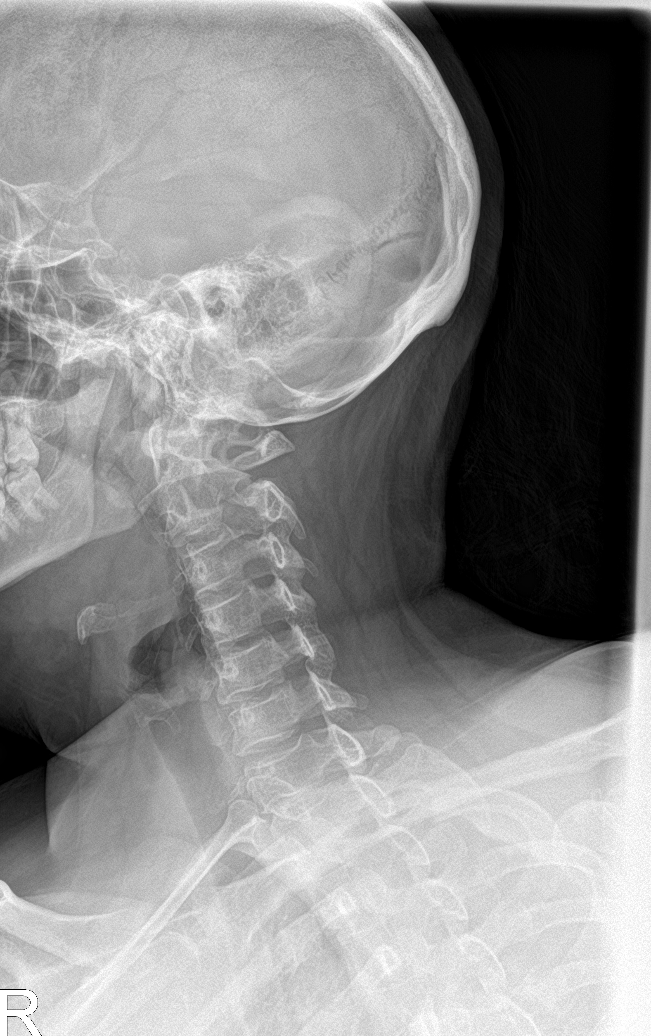

[c-spine ap]
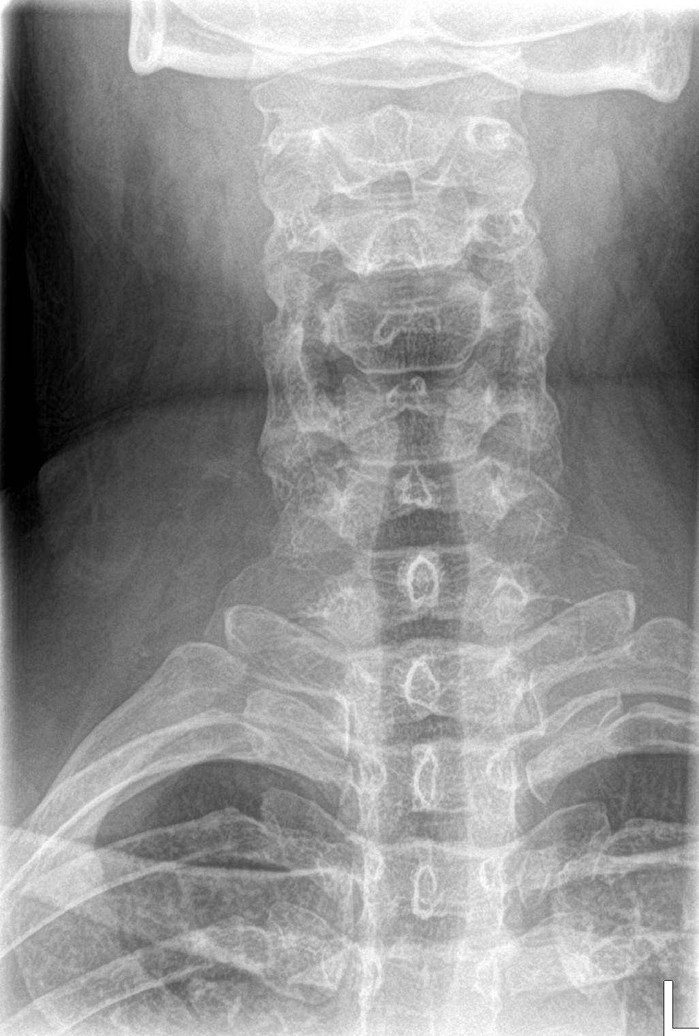

[c-spine open mouth]
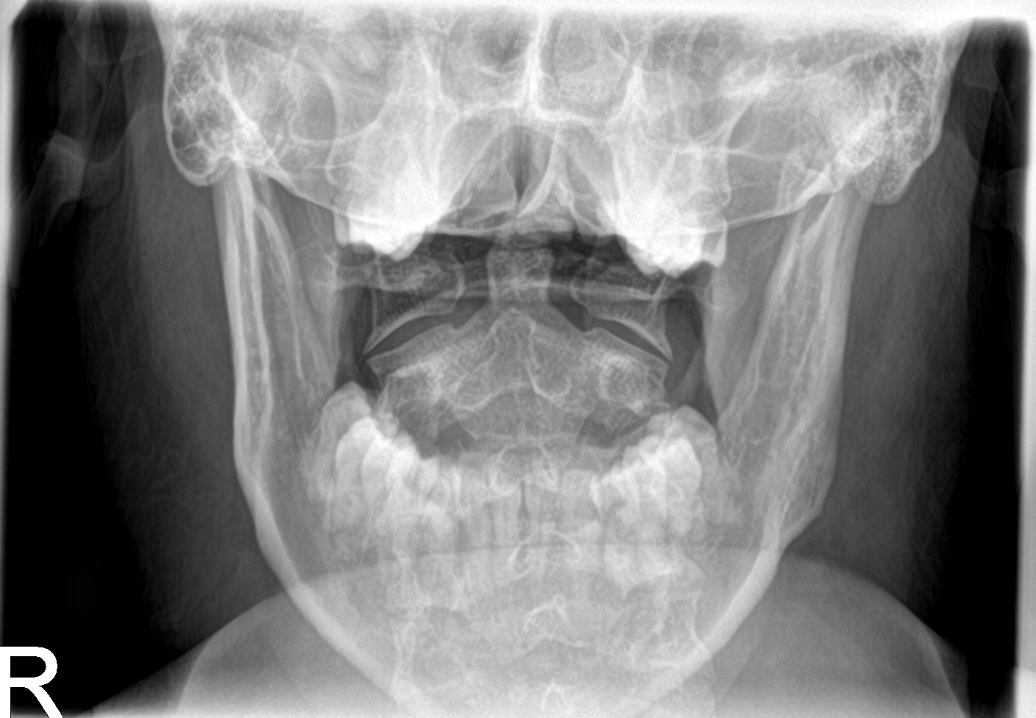

[swimmer]
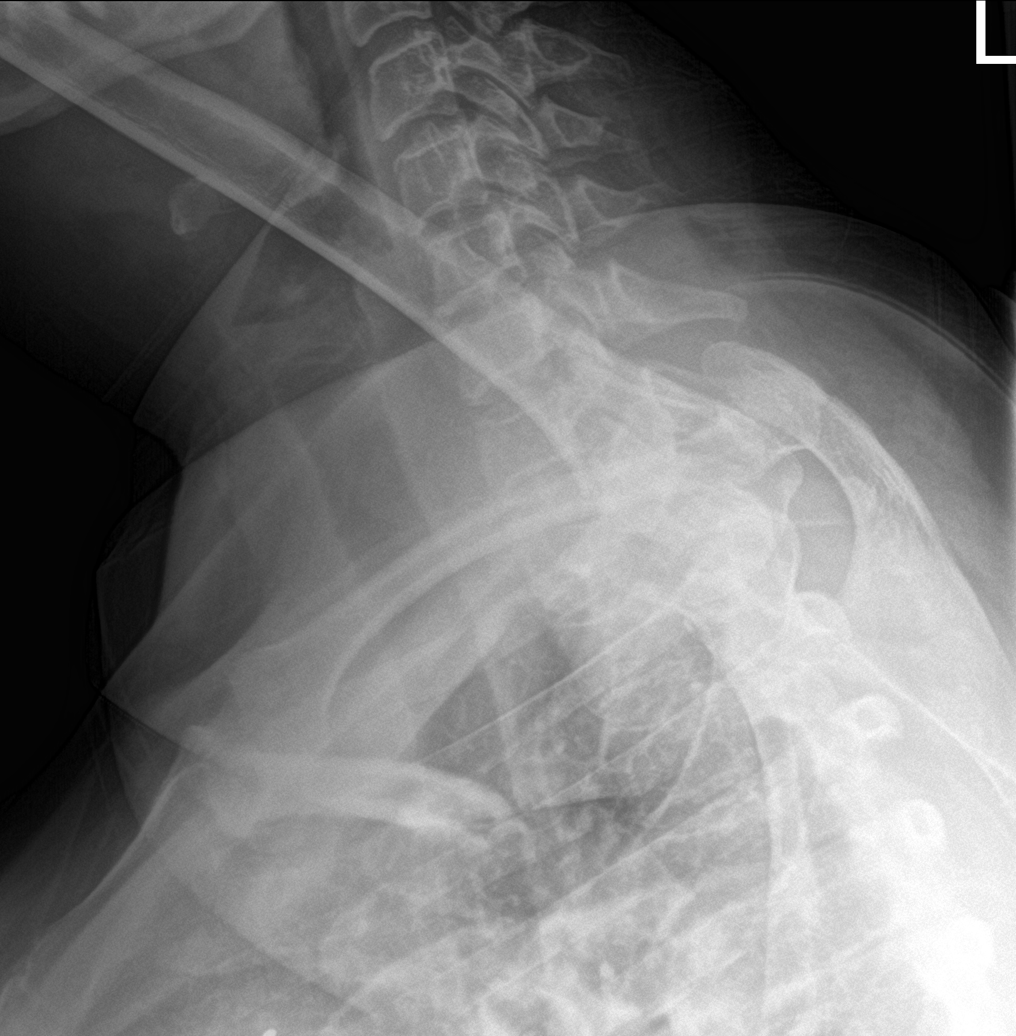

[6 of 6 positions shown; findings below may reference images not displayed]

FINDINGS: Mild lower cervical spine degenerative change. No acute bony
abnormality. No evidence of fracture or dislocation. Pulmonary
apices are clear.
IMPRESSION: No acute abnormality identified. Mild lower cervical spine
degenerative change

## 2018-09-12 ENCOUNTER — Ambulatory Visit: Payer: BLUE CROSS/BLUE SHIELD | Admitting: Family Medicine

## 2018-09-12 VITALS — BP 120/88 | HR 83 | Temp 98.4°F | Resp 16 | Ht 64.0 in | Wt 257.6 lb

## 2018-09-12 DIAGNOSIS — K1120 Sialoadenitis, unspecified: Secondary | ICD-10-CM

## 2018-09-12 DIAGNOSIS — J0110 Acute frontal sinusitis, unspecified: Secondary | ICD-10-CM

## 2018-09-12 MED ORDER — AMOXICILLIN-POT CLAVULANATE 875-125 MG PO TABS
1.0000 | ORAL_TABLET | Freq: Two times a day (BID) | ORAL | 0 refills | Status: DC
Start: 2018-09-12 — End: 2018-09-24

## 2018-09-12 MED FILL — AMOX-CLAV 875-125 MG TABLET: 875-125 | 10 days supply | Qty: 20 | Fill #0

## 2018-09-12 NOTE — Progress Notes (Signed)
Patient ID: Alicia Cox, female    DOB: 1971-11-16  Age: 46 y.o. MRN: [id number]    Subjective:  Subjective  HPI Alicia Cox presents for lump on R side of cheek x 3 weeks   No other symptoms  + tenderness frontal / max sinuses  + congestion    Review of Systems  Constitutional: Negative for appetite change, diaphoresis, fatigue and unexpected weight change.  HENT: Positive for congestion, facial swelling, sinus pressure and sinus pain. Negative for dental problem.   Eyes: Negative for pain, redness and visual disturbance.  Respiratory: Negative for cough, chest tightness, shortness of breath and wheezing.   Cardiovascular: Negative for chest pain, palpitations and leg swelling.  Endocrine: Negative for cold intolerance, heat intolerance, polydipsia, polyphagia and polyuria.  Genitourinary: Negative for difficulty urinating, dysuria and frequency.  Neurological: Negative for dizziness, light-headedness, numbness and headaches.    History Past Medical History:  Diagnosis Date  . Acute bronchitis 11/03/2013  . Allergy    hay fever  . Arthritis   . Blood in stool   . Cervical cancer screening 09/14/2016   Menarche at 13 Regular and heavy flow no history of abnormal pap in past G2P2, s/p 2 svd No history of abnormal MGM No concerns today  gyn surgeries: tubal and endometrial ablation LMP 09/13/2016  . Chest pain 08/24/2013  . Chicken pox as a child  . Dermatitis 03/02/2016  . Frequent headaches   . GERD (gastroesophageal reflux disease)   . Heart murmur   . Hypothyroidism    Hashimoto's  . Knee pain, left 01/09/2014  . Low back pain 08/26/2017  . Migraine   . Obesity 11/15/2014  . Preventative health care 09/12/2015  . TOS (thoracic outlet syndrome) 08/25/2013  . UTI (urinary tract infection)   . Vitamin D deficiency     She has a past surgical history that includes Tonsillectomy; Cholecystectomy; Appendectomy; Endometrial ablation; Tubal ligation (1999); and Cesarean  section.   Her family history includes Asthma in her daughter; Diabetes in her father, paternal grandfather, paternal grandmother, and sister; Heart attack in her maternal grandfather; Heart disease in her maternal grandmother; Hyperlipidemia in her mother; Hypertension in her mother and sister; Skin cancer in her mother.She reports that she has never smoked. She has never used smokeless tobacco. She reports that she does not drink alcohol or use drugs.  Current Outpatient Medications on File Prior to Visit  Medication Sig Dispense Refill  . ALPRAZolam (XANAX) 0.25 MG tablet Take 1 tablet (0.25 mg total) by mouth 2 (two) times daily as needed for anxiety. 20 tablet 0  . Diclofenac Sodium (PENNSAID) 2 % SOLN Place 2 application onto the skin 2 (two) times daily. 112 g 3  . escitalopram (LEXAPRO) 10 MG tablet TAKE 1 TABLET (10 MG TOTAL) BY MOUTH DAILY. 90 tablet 1  . gabapentin (NEURONTIN) 300 MG capsule nightly 90 capsule 3  . levothyroxine (SYNTHROID, LEVOTHROID) 100 MCG tablet TAKE ONE TABLET BY MOUTH ONCE DAILY EXCEPT  TAKE  ONE  AND  ONE-HALF  TABLETS  ON  THURSDAYS  AND  SUNDAYS 105 tablet 3  . montelukast (SINGULAIR) 10 MG tablet TAKE 1 TABLET (10 MG TOTAL) BY MOUTH AT BEDTIME. 90 tablet 1  . nitroGLYCERIN (NITRODUR - DOSED IN MG/24 HR) 0.2 mg/hr patch 1/4 patch daily 30 patch 1  . rivaroxaban (XARELTO) 10 MG TABS tablet Take 1 tablet (10 mg total) by mouth daily. 30 tablet 12  . tiZANidine (ZANAFLEX) 4 MG tablet Take  0.5-1 tablets (2-4 mg total) by mouth every 8 (eight) hours as needed for muscle spasms. 40 tablet 2  . VENTOLIN HFA 108 (90 Base) MCG/ACT inhaler INHALE 2 PUFFS BY MOUTH INTO THE LUNGS EVERY 6 (SIX) HOURS AS NEEDED FOR WHEEZING OR SHORTNESS OF BREATH. 18 g 0   No current facility-administered medications on file prior to visit.      Objective:  Objective  Physical Exam  Constitutional: She is oriented to person, place, and time. She appears well-developed and  well-nourished.  HENT:  Head: Normocephalic and atraumatic.  Nose: Right sinus exhibits maxillary sinus tenderness and frontal sinus tenderness. Left sinus exhibits maxillary sinus tenderness and frontal sinus tenderness.  Eyes: Conjunctivae and EOM are normal.  Neck: Normal range of motion. Neck supple. No JVD present. Carotid bruit is not present. No thyromegaly present.  Cardiovascular: Normal rate, regular rhythm and normal heart sounds.  No murmur heard. Pulmonary/Chest: Effort normal and breath sounds normal. No respiratory distress. She has no wheezes. She has no rales. She exhibits no tenderness.  Musculoskeletal: She exhibits no edema.  Lymphadenopathy:    She has cervical adenopathy.  Neurological: She is alert and oriented to person, place, and time.  Psychiatric: She has a normal mood and affect.  Nursing note and vitals reviewed.  BP 120/88 (BP Location: Right Arm, Cuff Size: Large)   Pulse 83   Temp 98.4 F (36.9 C) (Oral)   Resp 16   Ht 5\' 4"  (1.626 m)   Wt 257 lb 9.6 oz (116.8 kg)   SpO2 96%   BMI 44.22 kg/m  Wt Readings from Last 3 Encounters:  09/12/18 257 lb 9.6 oz (116.8 kg)  08/14/18 253 lb (114.8 kg)  07/01/18 252 lb (114.3 kg)     Lab Results  Component Value Date   WBC 10.1 (H) 05/24/2018   HGB 15.6 05/24/2018   HCT 46.1 05/24/2018   PLT 321 05/24/2018   GLUCOSE 161 (H) 05/24/2018   CHOL 178 09/17/2017   TRIG 258.0 (H) 09/17/2017   HDL 38.10 (L) 09/17/2017   LDLDIRECT 104.0 09/17/2017   LDLCALC 117 (H) 03/09/2017   ALT 27 05/24/2018   AST 19 05/24/2018   NA 137 05/24/2018   K 3.9 05/24/2018   CL 105 05/24/2018   CREATININE 0.83 05/24/2018   BUN 13 05/24/2018   CO2 24 05/24/2018   TSH 2.58 03/18/2018   HGBA1C 5.6 09/17/2017    Mr Cervical Spine Wo Contrast  Result Date: 08/27/2018 CLINICAL DATA:  Cervicalgia EXAM: MRI CERVICAL SPINE WITHOUT CONTRAST TECHNIQUE: Multiplanar, multisequence MR imaging of the cervical spine was performed.  No intravenous contrast was administered. COMPARISON:  Cervical radiographs 03/18/2018 FINDINGS: Alignment: Normal Vertebrae: Normal bone marrow.  Negative for fracture or mass Cord: Normal signal and morphology Posterior Fossa, vertebral arteries, paraspinal tissues: Negative Disc levels: C2-3: Negative C3-4: Negative C4-5: Negative C5-6: Small central disc protrusion. Negative for spinal or foraminal stenosis. No cord deformity C6-7: Negative C7-T1: Negative IMPRESSION: Small central disc protrusion C5-6 without spinal or foraminal stenosis. Otherwise negative cervical spine Electronically Signed   By: Marlan Palau M.D.   On: 08/27/2018 07:24     Assessment & Plan:  Plan  I am having Alicia Rommel. Cox start on amoxicillin-clavulanate. I am also having her maintain her Diclofenac Sodium, VENTOLIN HFA, ALPRAZolam, levothyroxine, gabapentin, tiZANidine, rivaroxaban, nitroGLYCERIN, montelukast, and escitalopram.  Meds ordered this encounter  Medications  . amoxicillin-clavulanate (AUGMENTIN) 875-125 MG tablet    Sig: Take 1 tablet by  mouth 2 (two) times daily.    Dispense:  20 tablet    Refill:  0    Problem List Items Addressed This Visit      Unprioritized   Acute frontal sinusitis - Primary    abx per orders Use otc flonase and antihistamine rto prn      Relevant Medications   amoxicillin-clavulanate (AUGMENTIN) 875-125 MG tablet    Other Visit Diagnoses    Parotiditis       Relevant Medications   amoxicillin-clavulanate (AUGMENTIN) 875-125 MG tablet      Follow-up: Return if symptoms worsen or fail to improve.  Donato Schultz, DO

## 2018-09-12 NOTE — Patient Instructions (Signed)
Parotitis Parotitis is irritation and swelling (inflammation) of one or both of your parotid glands. These glands produce saliva. They are found on each side of your face, below and in front of your earlobes. The saliva that they produce comes out of tiny openings (ducts) inside your cheeks. Parotitis may cause sudden swelling and pain (acute parotitis). It can also cause repeated episodes of swelling and pain or continued swelling that may or may not be painful (chronic parotitis). What are the causes? Causes of this condition include:  Bacterial infections.  Viral infections, such as mumps and HIV.  Blockage (obstruction) of saliva flow through the parotid glands. This can be from a stone, scar tissue, or tumor.  Diseases that cause your body's defense system to attack salivary glands and cause inflammation (autoimmune diseases).  What increases the risk? This condition is more likely to develop in:  People who are 50 or older.  People who do not drink enough fluids (dehydration).  People who drink too much alcohol.  People who have a dry mouth.  People who have poor dental hygiene.  People who have diabetes.  People who have gout.  People who have a long-term illness.  People who have had X-ray treatments to the head and neck.  People who take certain medicines.  What are the signs or symptoms? Symptoms of this condition depend on the cause. Symptoms may include:  Swelling under and in front of the ear. This may get worse after eating.  Redness of the skin over the parotid gland.  Pain and tenderness over the parotid gland. This may get worse after eating.  Fever or chills.  Pus coming from the ducts inside the mouth.  Dry mouth.  A bad taste in the mouth.  How is this diagnosed? This condition is diagnosed with a medical history and physical exam. You may also have tests to find the cause of parotitis. These tests may include:  Doing blood tests to check  for autoimmune disease or a viral infection.  Taking a sample of fluid from the parotid gland to test for infection.  Injecting the ducts of the gland with a dye before taking X-rays (sialogram).  Having other imaging studies of the gland, including X-rays, ultrasound, MRI, or CT scan.  Checking the opening of the gland for a stone or obstruction.  Placing a needle into the gland to remove tissue for a biopsy (fine needle aspiration).  How is this treated? Treatment for this condition depends on the cause. Treatment may include:  Antibiotic medicine for a bacterial infection.  Drinking more fluids.  Removing a stone or obstruction.  Treating an underlying disease that is causing parotitis.  Surgery to drain an infection, remove a growth, or remove the whole gland (parotidectomy).  Treatment may not be needed if parotid swelling goes away with home care. Follow these instructions at home: Medicines  Take over-the-counter and prescription medicines only as told by your health care provider.  If you were prescribed an antibiotic, take it as told by your health care provider. Do not stop taking the antibiotic even if you start to feel better. Managing pain and swelling  Apply warm compresses to the affected area as told by your health care provider.  Gently massage the parotid glands as told by your health care provider. General instructions   Drink enough fluid to keep your urine clear or pale yellow.  Try sucking on sour candy. This may help to make your mouth less dry and   may stimulate the flow of saliva.  Keep your mouth clean and moist. Gargle with a salt-water mixture 3-4 times per day, or as needed. To make a salt-water mixture, completely dissolve -1 tsp of salt in 1 cup of warm water.  Maintain good oral health. ? Brush your teeth at least two times per day. ? Floss your teeth every day. ? See your dentist regularly.  Do not use tobacco products, including  cigarettes, chewing tobacco, or e-cigarettes. If you need help quitting, ask your health care provider.  Keep all follow-up visits as told by your health care provider. This is important. Contact a health care provider if:  You have a fever or chills.  You have new symptoms.  Your symptoms get worse.  Your symptoms do not improve with treatment. This information is not intended to replace advice given to you by your health care provider. Make sure you discuss any questions you have with your health care provider. Document Released: 05/26/2002 Document Revised: 05/11/2016 Document Reviewed: 04/29/2015 Elsevier Interactive Patient Education  2018 [employer]  

## 2018-09-15 ENCOUNTER — Encounter: Payer: Self-pay | Admitting: Family Medicine

## 2018-09-15 NOTE — Assessment & Plan Note (Signed)
abx per orders Use otc flonase and antihistamine rto prn

## 2018-09-18 ENCOUNTER — Encounter: Payer: Self-pay | Admitting: Hematology & Oncology

## 2018-09-24 ENCOUNTER — Ambulatory Visit (INDEPENDENT_AMBULATORY_CARE_PROVIDER_SITE_OTHER): Payer: BLUE CROSS/BLUE SHIELD | Admitting: Family Medicine

## 2018-09-24 ENCOUNTER — Encounter: Payer: Self-pay | Admitting: Family Medicine

## 2018-09-24 VITALS — BP 114/70 | HR 74 | Temp 98.1°F | Resp 18 | Ht 64.0 in | Wt 257.0 lb

## 2018-09-24 DIAGNOSIS — E039 Hypothyroidism, unspecified: Secondary | ICD-10-CM

## 2018-09-24 DIAGNOSIS — D72829 Elevated white blood cell count, unspecified: Secondary | ICD-10-CM

## 2018-09-24 DIAGNOSIS — E782 Mixed hyperlipidemia: Secondary | ICD-10-CM

## 2018-09-24 DIAGNOSIS — M542 Cervicalgia: Secondary | ICD-10-CM

## 2018-09-24 DIAGNOSIS — R739 Hyperglycemia, unspecified: Secondary | ICD-10-CM

## 2018-09-24 DIAGNOSIS — Z0000 Encounter for general adult medical examination without abnormal findings: Secondary | ICD-10-CM

## 2018-09-24 DIAGNOSIS — R221 Localized swelling, mass and lump, neck: Secondary | ICD-10-CM

## 2018-09-24 DIAGNOSIS — Z1239 Encounter for other screening for malignant neoplasm of breast: Secondary | ICD-10-CM | POA: Diagnosis not present

## 2018-09-24 DIAGNOSIS — M25571 Pain in right ankle and joints of right foot: Secondary | ICD-10-CM

## 2018-09-24 MED ORDER — CEPHALEXIN 500 MG PO CAPS: 500.0000 mg | ORAL_CAPSULE | Freq: Three times a day (TID) | ORAL | 0 refills | Status: DC

## 2018-09-24 MED ORDER — ALBUTEROL SULFATE HFA 108 (90 BASE) MCG/ACT IN AERS: INHALATION_SPRAY | RESPIRATORY_TRACT | 0 refills | Status: DC

## 2018-09-24 MED FILL — VENTOLIN HFA 90 MCG INHALER: 108 (90 BAS | 25 days supply | Qty: 18 | Fill #0

## 2018-09-24 NOTE — Patient Instructions (Addendum)
Zinc, elderberery and Vitamin C 500 to 1000 mg daily and a probiotic for roughly a month   Preventive Care 40-64 Years, Female Preventive care refers to lifestyle choices and visits with your health care provider that can promote health and wellness. What does preventive care include?  A yearly physical exam. This is also called an annual well check.  Dental exams once or twice a year.  Routine eye exams. Ask your health care provider how often you should have your eyes checked.  Personal lifestyle choices, including: ? Daily care of your teeth and gums. ? Regular physical activity. ? Eating a healthy diet. ? Avoiding tobacco and drug use. ? Limiting alcohol use. ? Practicing safe sex. ? Taking low-dose aspirin daily starting at age 82. ? Taking vitamin and mineral supplements as recommended by your health care provider. What happens during an annual well check? The services and screenings done by your health care provider during your annual well check will depend on your age, overall health, lifestyle risk factors, and family history of disease. Counseling Your health care provider may ask you questions about your:  Alcohol use.  Tobacco use.  Drug use.  Emotional well-being.  Home and relationship well-being.  Sexual activity.  Eating habits.  Work and work Statistician.  Method of birth control.  Menstrual cycle.  Pregnancy history.  Screening You may have the following tests or measurements:  Height, weight, and BMI.  Blood pressure.  Lipid and cholesterol levels. These may be checked every 5 years, or more frequently if you are over 61 years old.  Skin check.  Lung cancer screening. You may have this screening every year starting at age 50 if you have a 30-pack-year history of smoking and currently smoke or have quit within the past 15 years.  Fecal occult blood test (FOBT) of the stool. You may have this test every year starting at age  31.  Flexible sigmoidoscopy or colonoscopy. You may have a sigmoidoscopy every 5 years or a colonoscopy every 10 years starting at age 34.  Hepatitis C blood test.  Hepatitis B blood test.  Sexually transmitted disease (STD) testing.  Diabetes screening. This is done by checking your blood sugar (glucose) after you have not eaten for a while (fasting). You may have this done every 1-3 years.  Mammogram. This may be done every 1-2 years. Talk to your health care provider about when you should start having regular mammograms. This may depend on whether you have a family history of breast cancer.  BRCA-related cancer screening. This may be done if you have a family history of breast, ovarian, tubal, or peritoneal cancers.  Pelvic exam and Pap test. This may be done every 3 years starting at age 58. Starting at age 62, this may be done every 5 years if you have a Pap test in combination with an HPV test.  Bone density scan. This is done to screen for osteoporosis. You may have this scan if you are at high risk for osteoporosis.  Discuss your test results, treatment options, and if necessary, the need for more tests with your health care provider. Vaccines Your health care provider may recommend certain vaccines, such as:  Influenza vaccine. This is recommended every year.  Tetanus, diphtheria, and acellular pertussis (Tdap, Td) vaccine. You may need a Td booster every 10 years.  Varicella vaccine. You may need this if you have not been vaccinated.  Zoster vaccine. You may need this after age 74.  Measles,  mumps, and rubella (MMR) vaccine. You may need at least one dose of MMR if you were born in 1957 or later. You may also need a second dose.  Pneumococcal 13-valent conjugate (PCV13) vaccine. You may need this if you have certain conditions and were not previously vaccinated.  Pneumococcal polysaccharide (PPSV23) vaccine. You may need one or two doses if you smoke cigarettes or if you  have certain conditions.  Meningococcal vaccine. You may need this if you have certain conditions.  Hepatitis A vaccine. You may need this if you have certain conditions or if you travel or work in places where you may be exposed to hepatitis A.  Hepatitis B vaccine. You may need this if you have certain conditions or if you travel or work in places where you may be exposed to hepatitis B.  Haemophilus influenzae type b (Hib) vaccine. You may need this if you have certain conditions.  Talk to your health care provider about which screenings and vaccines you need and how often you need them. This information is not intended to replace advice given to you by your health care provider. Make sure you discuss any questions you have with your health care provider. Document Released: 12/31/2015 Document Revised: 08/23/2016 Document Reviewed: 10/05/2015 Elsevier Interactive Patient Education  [employer].

## 2018-09-25 LAB — CBC
HEMATOCRIT: 43.7 % (ref 36.0–46.0)
Hemoglobin: 14.5 g/dL (ref 12.0–15.0)
MCHC: 33.1 g/dL (ref 30.0–36.0)
MCV: 98.7 fl (ref 78.0–100.0)
Platelets: 360 10*3/uL (ref 150.0–400.0)
RBC: 4.43 Mil/uL (ref 3.87–5.11)
RDW: 12.5 % (ref 11.5–15.5)
WBC: 12.2 10*3/uL — ABNORMAL HIGH (ref 4.0–10.5)

## 2018-09-25 LAB — COMPREHENSIVE METABOLIC PANEL
ALK PHOS: 50 U/L (ref 39–117)
ALT: 22 U/L (ref 0–35)
AST: 20 U/L (ref 0–37)
Albumin: 4.2 g/dL (ref 3.5–5.2)
BILIRUBIN TOTAL: 0.3 mg/dL (ref 0.2–1.2)
BUN: 10 mg/dL (ref 6–23)
CO2: 26 meq/L (ref 19–32)
CREATININE: 0.67 mg/dL (ref 0.40–1.20)
Calcium: 9.3 mg/dL (ref 8.4–10.5)
Chloride: 103 mEq/L (ref 96–112)
GFR: 100.46 mL/min (ref >60.00)
Glucose, Bld: 84 mg/dL (ref 70–99)
Sodium: 138 mEq/L (ref 135–145)
TOTAL PROTEIN: 7.7 g/dL (ref 6.0–8.3)

## 2018-09-25 LAB — LIPID PANEL
CHOLESTEROL: 174 mg/dL (ref 0–200)
HDL: 39.3 mg/dL (ref >39.00)
LDL Cholesterol: 97 mg/dL (ref 0–99)
NonHDL: 134.97
TRIGLYCERIDES: 192 mg/dL — AB (ref 0.0–149.0)
Total CHOL/HDL Ratio: 4
VLDL: 38.4 mg/dL (ref 0.0–40.0)

## 2018-09-25 LAB — HEMOGLOBIN A1C: Hgb A1c MFr Bld: 5.8 % (ref 4.6–6.5)

## 2018-09-25 LAB — TSH: TSH: 2.64 u[IU]/mL (ref 0.35–4.50)

## 2018-09-26 ENCOUNTER — Ambulatory Visit
Admission: RE | Admit: 2018-09-26 | Discharge: 2018-09-26 | Disposition: A | Discharge status: Home / Self Care | Payer: BLUE CROSS/BLUE SHIELD | Source: Ambulatory Visit | Attending: Family Medicine | Admitting: Family Medicine

## 2018-09-26 DIAGNOSIS — M5412 Radiculopathy, cervical region: Secondary | ICD-10-CM

## 2018-09-26 DIAGNOSIS — M47812 Spondylosis without myelopathy or radiculopathy, cervical region: Secondary | ICD-10-CM | POA: Diagnosis not present

## 2018-09-26 MED ORDER — TRIAMCINOLONE ACETONIDE 40 MG/ML IJ SUSP (RADIOLOGY)
60.0000 mg | Freq: Once | INTRAMUSCULAR | Status: AC
  Administered 2018-09-26: 60 mg via EPIDURAL

## 2018-09-26 MED ORDER — IOPAMIDOL (ISOVUE-M 300) INJECTION 61%
1.0000 mL | Freq: Once | INTRAMUSCULAR | Status: AC | PRN
  Administered 2018-09-26: 1 mL via EPIDURAL

## 2018-09-26 NOTE — Discharge Instructions (Signed)

## 2018-09-29 DIAGNOSIS — D72829 Elevated white blood cell count, unspecified: Secondary | ICD-10-CM | POA: Insufficient documentation

## 2018-09-29 DIAGNOSIS — R221 Localized swelling, mass and lump, neck: Secondary | ICD-10-CM | POA: Insufficient documentation

## 2018-09-29 NOTE — Assessment & Plan Note (Signed)
Encouraged moist heat and gentle stretching as tolerated. May try NSAIDs and prescription meds as directed and report if symptoms worsen or seek immediate care 

## 2018-09-29 NOTE — Progress Notes (Signed)
Subjective:    Patient ID: Alicia Cox, female    DOB: Aug 21, 1971, 47 y.o.   MRN: [id number]  No chief complaint on file.   HPI Patient is in today for annual preventative exam and follow up on chronic medical concerns including hyperlipidemia, hyperglycemia and obesity. No polyuria or polydipsia. No recent febrile illness or hospitalizations. 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 She is tired but manages most days. Is not exercising regularly. Denies CP/palp/SOB/HA/fevers/GI or GU c/o. Taking meds as prescribed. Does have some trouble with neck stiffness and pain at times and has had some increased head congestion and PND productive oe yellow phlegm fo about 3 weeks now.   Past Medical History:  Diagnosis Date  . Acute bronchitis 11/03/2013  . Allergy    hay fever  . Arthritis   . Blood in stool   . Cervical cancer screening 09/14/2016   Menarche at 13 Regular and heavy flow no history of abnormal pap in past G2P2, s/p 2 svd No history of abnormal MGM No concerns today  gyn surgeries: tubal and endometrial ablation LMP 09/13/2016  . Chest pain 08/24/2013  . Chicken pox as a child  . Dermatitis 03/02/2016  . Frequent headaches   . GERD (gastroesophageal reflux disease)   . Heart murmur   . Hypothyroidism    Hashimoto's  . Knee pain, left 01/09/2014  . Low back pain 08/26/2017  . Migraine   . Obesity 11/15/2014  . Preventative health care 09/12/2015  . TOS (thoracic outlet syndrome) 08/25/2013  . UTI (urinary tract infection)   . Vitamin D deficiency     Past Surgical History:  Procedure Laterality Date  . APPENDECTOMY    . CESAREAN SECTION     twice  . CHOLECYSTECTOMY    . ENDOMETRIAL ABLATION    . TONSILLECTOMY    . TUBAL LIGATION  1999    Family History  Problem Relation Age of Onset  . Hyperlipidemia Mother   . Hypertension Mother   . Skin cancer Mother   . Diabetes Father        type 2  . Hypertension Sister   .  Diabetes Sister   . Asthma Daughter   . Heart disease Maternal Grandmother   . Heart attack Maternal Grandfather   . Diabetes Paternal Grandmother   . Diabetes Paternal Grandfather     Social History   Socioeconomic History  . Marital status: Married    Spouse name: Not on file  . Number of children: Not on file  . Years of education: Not on file  . Highest education level: Not on file  Occupational History  . Not on file  Social Needs  . Financial resource strain: Not on file  . Food insecurity:    Worry: Not on file    Inability: Not on file  . Transportation needs:    Medical: Not on file    Non-medical: Not on file  Tobacco Use  . Smoking status: Never Smoker  . Smokeless tobacco: Never Used  . Tobacco comment: NEVER USED TOBACCO  Substance and Sexual Activity  . Alcohol use: No    Alcohol/week: 0.0 standard drinks  . Drug use: No  . 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 Physical activity:    Days per week: Not on  file    Minutes per session: Not on file  . Stress: Not on file  Relationships  . Social connections:    Talks on phone: Not on file    Gets together: Not on file    Attends religious service: Not on file    Active member of club or organization: Not on file    Attends meetings of clubs or organizations: Not on file    Relationship status: Not on file  . Intimate partner violence:    Fear of current or ex partner: Not on file    Emotionally abused: Not on file    Physically abused: Not on file    Forced sexual activity: Not on file  Other Topics Concern  . Not on file  Social History Narrative  . Not on file    Outpatient Medications Prior to Visit  Medication Sig Dispense Refill  . Diclofenac Sodium (PENNSAID) 2 % SOLN Place 2 application onto the skin 2 (two) times daily. 112 g 3  . escitalopram (LEXAPRO) 10 MG tablet TAKE 1 TABLET (10 MG  TOTAL) BY MOUTH DAILY. 90 tablet 1  . gabapentin (NEURONTIN) 300 MG capsule nightly 90 capsule 3  . levothyroxine (SYNTHROID, LEVOTHROID) 100 MCG tablet TAKE ONE TABLET BY MOUTH ONCE DAILY EXCEPT  TAKE  ONE  AND  ONE-HALF  TABLETS  ON  THURSDAYS  AND  SUNDAYS 105 tablet 3  . montelukast (SINGULAIR) 10 MG tablet TAKE 1 TABLET (10 MG TOTAL) BY MOUTH AT BEDTIME. 90 tablet 1  . rivaroxaban (XARELTO) 10 MG TABS tablet Take 1 tablet (10 mg total) by mouth daily. 30 tablet 12  . tiZANidine (ZANAFLEX) 4 MG tablet Take 0.5-1 tablets (2-4 mg total) by mouth every 8 (eight) hours as needed for muscle spasms. 40 tablet 2  . ALPRAZolam (XANAX) 0.25 MG tablet Take 1 tablet (0.25 mg total) by mouth 2 (two) times daily as needed for anxiety. 20 tablet 0  . amoxicillin-clavulanate (AUGMENTIN) 875-125 MG tablet Take 1 tablet by mouth 2 (two) times daily. 20 tablet 0  . VENTOLIN HFA 108 (90 Base) MCG/ACT inhaler INHALE 2 PUFFS BY MOUTH INTO THE LUNGS EVERY 6 (SIX) HOURS AS NEEDED FOR WHEEZING OR SHORTNESS OF BREATH. 18 g 0  . nitroGLYCERIN (NITRODUR - DOSED IN MG/24 HR) 0.2 mg/hr patch 1/4 patch daily 30 patch 1   No facility-administered medications prior to visit.     No Known Allergies  Review of Systems  Constitutional: Negative for chills, fever and malaise/fatigue.  HENT: Positive for congestion. Negative for hearing loss and sore throat.   Eyes: Negative for discharge.  Respiratory: Positive for cough, sputum production and shortness of breath.   Cardiovascular: Negative for chest pain, palpitations and leg swelling.  Gastrointestinal: Negative for abdominal pain, blood in stool, constipation, diarrhea, heartburn, nausea and vomiting.  Genitourinary: Negative for dysuria, frequency, hematuria and urgency.  Musculoskeletal: Negative for back pain, falls and myalgias.  Skin: Negative for rash.  Neurological: Negative for dizziness, sensory change, loss of consciousness, weakness and headaches.    Endo/Heme/Allergies: Negative for environmental allergies. Does not bruise/bleed easily.  Psychiatric/Behavioral: Negative for depression and suicidal ideas. The patient is not nervous/anxious and does not have insomnia.        Objective:    Physical Exam  Constitutional: She is oriented to person, place, and time. She appears well-developed and well-nourished. No distress.  HENT:  Head: Normocephalic and atraumatic.  TMs dull and retracted  Eyes: Conjunctivae are normal.  Neck: Neck supple. No thyromegaly present.  Cardiovascular: Normal rate, regular rhythm and normal heart sounds.  No murmur heard. Pulmonary/Chest: Effort normal and breath sounds normal. No respiratory distress.  Abdominal: Soft. Bowel sounds are normal. She exhibits no distension and no mass. There is no tenderness.  Musculoskeletal: She exhibits no edema.  Lymphadenopathy:    She has no cervical adenopathy.  Neurological: She is alert and oriented to person, place, and time.  Skin: Skin is warm and dry.  Psychiatric: She has a normal mood and affect. Her behavior is normal.    BP 114/70 (BP Location: Left Arm, Patient Position: Sitting, Cuff Size: Normal)   Pulse 74   Temp 98.1 F (36.7 C) (Oral)   Resp 18   Ht 5\' 4"  (1.626 m)   Wt 257 lb (116.6 kg)   SpO2 99%   BMI 44.11 kg/m  Wt Readings from Last 3 Encounters:  09/24/18 257 lb (116.6 kg)  09/12/18 257 lb 9.6 oz (116.8 kg)  08/14/18 253 lb (114.8 kg)     Lab Results  Component Value Date   WBC 12.2 (H) 09/24/2018   HGB 14.5 09/24/2018   HCT 43.7 09/24/2018   PLT 360.0 09/24/2018   GLUCOSE 84 09/24/2018   CHOL 174 09/24/2018   TRIG 192.0 (H) 09/24/2018   HDL 39.30 09/24/2018   LDLDIRECT 104.0 09/17/2017   LDLCALC 97 09/24/2018   ALT 22 09/24/2018   AST 20 09/24/2018   NA 138 09/24/2018   K 4.1 Hemolyzed 09/24/2018   CL 103 09/24/2018   CREATININE 0.67 09/24/2018   BUN 10 09/24/2018   CO2 26 09/24/2018   TSH 2.64 09/24/2018    HGBA1C 5.8 09/24/2018    Lab Results  Component Value Date   TSH 2.64 09/24/2018   Lab Results  Component Value Date   WBC 12.2 (H) 09/24/2018   HGB 14.5 09/24/2018   HCT 43.7 09/24/2018   MCV 98.7 09/24/2018   PLT 360.0 09/24/2018   Lab Results  Component Value Date   NA 138 09/24/2018   K 4.1 Hemolyzed 09/24/2018   CO2 26 09/24/2018   GLUCOSE 84 09/24/2018   BUN 10 09/24/2018   CREATININE 0.67 09/24/2018   BILITOT 0.3 09/24/2018   ALKPHOS 50 09/24/2018   AST 20 09/24/2018   ALT 22 09/24/2018   PROT 7.7 09/24/2018   ALBUMIN 4.2 09/24/2018   CALCIUM 9.3 09/24/2018   ANIONGAP 8 05/24/2018   GFR 100.46 09/24/2018   Lab Results  Component Value Date   CHOL 174 09/24/2018   Lab Results  Component Value Date   HDL 39.30 09/24/2018   Lab Results  Component Value Date   LDLCALC 97 09/24/2018   Lab Results  Component Value Date   TRIG 192.0 (H) 09/24/2018   Lab Results  Component Value Date   CHOLHDL 4 09/24/2018   Lab Results  Component Value Date   HGBA1C 5.8 09/24/2018       Assessment & Plan:   Problem List Items Addressed This Visit    Hypothyroidism    On Levothyroxine, continue to monitor      Relevant Orders   TSH (Completed)   Hyperglycemia    hgba1c acceptable, minimize simple carbs. Increase exercise as tolerated.       Relevant Orders   Hemoglobin A1c (Completed)   Comprehensive metabolic panel (Completed)   Hyperlipidemia, mixed    Encouraged heart healthy diet, increase exercise, avoid trans fats, consider a krill oil cap daily  Relevant Orders   Lipid panel (Completed)   Obesity    Encouraged DASH diet, decrease po intake and increase exercise as tolerated. Needs 7-8 hours of sleep nightly. Avoid trans fats, eat small, frequent meals every 4-5 hours with lean proteins, complex carbs and healthy fats. Minimize simple carbs      Preventative health care    Patient encouraged to maintain heart healthy diet, regular  exercise, adequate sleep. Consider daily probiotics. Take medications as prescribed. Labs reviewed. Given and reviewed copy of ACP documents from [facility] Secretary of State and encouraged to complete and return      Acute right ankle pain   Neck pain    Encouraged moist heat and gentle stretching as tolerated. May try NSAIDs and prescription meds as directed and report if symptoms worsen or seek immediate care      Neck mass - Primary    Palpable on right anterior neck, possibly lymphadenopathy but notably enlarged will proceed with ultrasound of neck.       Relevant Orders   US SOFT TISSUE HEAD & NECK (NON-THYROID)   CBC (Completed)   Comprehensive metabolic panel (Completed)   Leukocytosis    Recent respiratory symptoms with congestion productive of yellow phlegm. Will try a course of keflex and probiotics.      Relevant Orders   CBC w/Diff    Other Visit Diagnoses    Breast cancer screening       Relevant Orders   MM 3D SCREEN BREAST BILATERAL      I have discontinued Daneya Hartgrove. Sarate's ALPRAZolam, nitroGLYCERIN, and amoxicillin-clavulanate. I have also changed her VENTOLIN HFA to albuterol. Additionally, I am having her start on cephALEXin. Lastly, I am having her maintain her Diclofenac Sodium, levothyroxine, gabapentin, tiZANidine, rivaroxaban, montelukast, and escitalopram.  Meds ordered this encounter  Medications  . albuterol (VENTOLIN HFA) 108 (90 Base) MCG/ACT inhaler    Sig: INHALE 2 PUFFS BY MOUTH INTO THE LUNGS EVERY 6 (SIX) HOURS AS NEEDED FOR WHEEZING OR SHORTNESS OF BREATH.    Dispense:  18 g    Refill:  0  . cephALEXin (KEFLEX) 500 MG capsule    Sig: Take 1 capsule (500 mg total) by mouth 3 (three) times daily.    Dispense:  30 capsule    Refill:  0    [name], MD

## 2018-09-29 NOTE — Assessment & Plan Note (Signed)
On Levothyroxine, continue to monitor 

## 2018-09-29 NOTE — Assessment & Plan Note (Signed)
Palpable on right anterior neck, possibly lymphadenopathy but notably enlarged will proceed with ultrasound of neck.

## 2018-09-29 NOTE — Assessment & Plan Note (Signed)
Recent respiratory symptoms with congestion productive of yellow phlegm. Will try a course of keflex and probiotics.

## 2018-09-29 NOTE — Assessment & Plan Note (Signed)
Encouraged DASH diet, decrease po intake and increase exercise as tolerated. Needs 7-8 hours of sleep nightly. Avoid trans fats, eat small, frequent meals every 4-5 hours with lean proteins, complex carbs and healthy fats. Minimize simple carbs 

## 2018-09-29 NOTE — Assessment & Plan Note (Signed)
hgba1c acceptable, minimize simple carbs. Increase exercise as tolerated.  

## 2018-09-29 NOTE — Assessment & Plan Note (Signed)
Patient encouraged to maintain heart healthy diet, regular exercise, adequate sleep. Consider daily probiotics. Take medications as prescribed. Labs reviewed. Given and reviewed copy of ACP documents from [place] Secretary of State and encouraged to complete and return 

## 2018-09-29 NOTE — Assessment & Plan Note (Signed)
Encouraged heart healthy diet, increase exercise, avoid trans fats, consider a krill oil cap daily 

## 2018-09-30 MED FILL — CEPHALEXIN 500 MG CAPSULE: 500 | 10 days supply | Qty: 30 | Fill #0

## 2018-10-01 ENCOUNTER — Other Ambulatory Visit: Payer: Self-pay | Admitting: Family Medicine

## 2018-10-01 ENCOUNTER — Ambulatory Visit (HOSPITAL_BASED_OUTPATIENT_CLINIC_OR_DEPARTMENT_OTHER)
Admission: RE | Admit: 2018-10-01 | Discharge: 2018-10-01 | Disposition: A | Discharge status: Home / Self Care | Payer: BLUE CROSS/BLUE SHIELD | Source: Ambulatory Visit | Attending: Family Medicine | Admitting: Family Medicine

## 2018-10-01 ENCOUNTER — Encounter: Payer: Self-pay | Admitting: Family Medicine

## 2018-10-01 DIAGNOSIS — E049 Nontoxic goiter, unspecified: Secondary | ICD-10-CM

## 2018-10-01 DIAGNOSIS — R221 Localized swelling, mass and lump, neck: Secondary | ICD-10-CM | POA: Insufficient documentation

## 2018-10-01 DIAGNOSIS — Z1239 Encounter for other screening for malignant neoplasm of breast: Secondary | ICD-10-CM | POA: Diagnosis not present

## 2018-10-01 DIAGNOSIS — Z1231 Encounter for screening mammogram for malignant neoplasm of breast: Secondary | ICD-10-CM | POA: Diagnosis not present

## 2018-10-02 ENCOUNTER — Ambulatory Visit (HOSPITAL_BASED_OUTPATIENT_CLINIC_OR_DEPARTMENT_OTHER)
Admission: RE | Admit: 2018-10-02 | Discharge: 2018-10-02 | Disposition: A | Discharge status: Home / Self Care | Payer: BLUE CROSS/BLUE SHIELD | Source: Ambulatory Visit | Attending: Family Medicine | Admitting: Family Medicine

## 2018-10-02 ENCOUNTER — Encounter (HOSPITAL_BASED_OUTPATIENT_CLINIC_OR_DEPARTMENT_OTHER): Payer: Self-pay

## 2018-10-02 ENCOUNTER — Other Ambulatory Visit (INDEPENDENT_AMBULATORY_CARE_PROVIDER_SITE_OTHER): Payer: BLUE CROSS/BLUE SHIELD

## 2018-10-02 DIAGNOSIS — R221 Localized swelling, mass and lump, neck: Secondary | ICD-10-CM

## 2018-10-02 DIAGNOSIS — E049 Nontoxic goiter, unspecified: Secondary | ICD-10-CM | POA: Diagnosis not present

## 2018-10-02 MED ORDER — IOPAMIDOL (ISOVUE-300) INJECTION 61%
100.0000 mL | Freq: Once | INTRAVENOUS | Status: AC | PRN
  Administered 2018-10-02: 75 mL via INTRAVENOUS

## 2018-10-02 NOTE — Telephone Encounter (Signed)
Author phoned pt., no answer. Author left detailed VM asking pt. to stop by in lab to get labs drawn as ordered by Dr. Abner Greenspan. Call back number given if questions.

## 2018-10-03 ENCOUNTER — Other Ambulatory Visit: Payer: Self-pay | Admitting: Family Medicine

## 2018-10-03 DIAGNOSIS — R221 Localized swelling, mass and lump, neck: Secondary | ICD-10-CM

## 2018-10-03 DIAGNOSIS — R7989 Other specified abnormal findings of blood chemistry: Secondary | ICD-10-CM

## 2018-10-03 LAB — THYROID PEROXIDASE ANTIBODY: THYROID PEROXIDASE ANTIBODY: 629 [IU]/mL — AB (ref <9)

## 2018-10-03 LAB — T4, FREE: FREE T4: 0.77 ng/dL (ref 0.60–1.60)

## 2018-10-03 LAB — T3, FREE: T3, Free: 2.6 pg/mL (ref 2.3–4.2)

## 2018-10-03 NOTE — Progress Notes (Signed)
amg

## 2018-10-04 ENCOUNTER — Encounter: Payer: Self-pay | Admitting: Family Medicine

## 2018-10-14 DIAGNOSIS — J343 Hypertrophy of nasal turbinates: Secondary | ICD-10-CM | POA: Diagnosis not present

## 2018-10-14 DIAGNOSIS — K118 Other diseases of salivary glands: Secondary | ICD-10-CM | POA: Diagnosis not present

## 2018-11-27 ENCOUNTER — Other Ambulatory Visit: Payer: BLUE CROSS/BLUE SHIELD

## 2018-11-27 ENCOUNTER — Ambulatory Visit: Payer: BLUE CROSS/BLUE SHIELD | Admitting: Hematology & Oncology

## 2018-11-29 DIAGNOSIS — D110 Benign neoplasm of parotid gland: Secondary | ICD-10-CM | POA: Diagnosis not present

## 2018-11-29 DIAGNOSIS — R221 Localized swelling, mass and lump, neck: Secondary | ICD-10-CM | POA: Diagnosis not present

## 2018-11-29 DIAGNOSIS — K118 Other diseases of salivary glands: Secondary | ICD-10-CM | POA: Diagnosis not present

## 2018-12-05 MED FILL — ESCITALOPRAM 10 MG TABLET: 10 | 90 days supply | Qty: 90 | Fill #1

## 2018-12-06 ENCOUNTER — Other Ambulatory Visit: Payer: Self-pay | Admitting: Otolaryngology

## 2018-12-06 ENCOUNTER — Ambulatory Visit (HOSPITAL_COMMUNITY)
Admission: RE | Admit: 2018-12-06 | Discharge: 2018-12-06 | Disposition: A | Discharge status: Home / Self Care | Payer: BLUE CROSS/BLUE SHIELD | Source: Ambulatory Visit | Attending: Family Medicine | Admitting: Family Medicine

## 2018-12-06 DIAGNOSIS — M7989 Other specified soft tissue disorders: Secondary | ICD-10-CM | POA: Insufficient documentation

## 2018-12-06 DIAGNOSIS — T801XXA Vascular complications following infusion, transfusion and therapeutic injection, initial encounter: Secondary | ICD-10-CM | POA: Diagnosis not present

## 2018-12-06 DIAGNOSIS — I808 Phlebitis and thrombophlebitis of other sites: Secondary | ICD-10-CM | POA: Diagnosis not present

## 2018-12-06 DIAGNOSIS — K91872 Postprocedural seroma of a digestive system organ or structure following a digestive system procedure: Secondary | ICD-10-CM | POA: Diagnosis not present

## 2018-12-06 NOTE — Progress Notes (Signed)
LUE venous duplex       has been completed. Preliminary results can be found under CV proc through chart review. Jeb LeveringJill Erman Thum, BS, RDMS, RVT    Called results to Dr. Melody ComasBullicky.

## 2019-03-03 ENCOUNTER — Other Ambulatory Visit: Payer: Self-pay | Admitting: Family Medicine

## 2019-03-03 ENCOUNTER — Encounter: Payer: Self-pay | Admitting: Family Medicine

## 2019-03-03 DIAGNOSIS — E039 Hypothyroidism, unspecified: Secondary | ICD-10-CM

## 2019-03-03 MED ORDER — LEVOTHYROXINE SODIUM 100 MCG PO TABS: ORAL_TABLET | ORAL | 3 refills | Status: DC

## 2019-03-06 ENCOUNTER — Encounter: Payer: Self-pay | Admitting: Family Medicine

## 2019-03-06 ENCOUNTER — Other Ambulatory Visit: Payer: Self-pay | Admitting: Family Medicine

## 2019-03-06 MED FILL — ESCITALOPRAM 10 MG TABLET: 10 | 90 days supply | Qty: 90 | Fill #0

## 2019-03-06 MED FILL — tiZANidine HCL 4 MG TABS: 4 | 13 days supply | Qty: 40 | Fill #2

## 2019-03-06 MED FILL — MONTELUKAST SOD 10 MG TAB: 10 | 90 days supply | Qty: 90 | Fill #1

## 2019-03-27 ENCOUNTER — Ambulatory Visit: Payer: BLUE CROSS/BLUE SHIELD | Admitting: Family Medicine

## 2019-04-11 IMAGING — DX DG PELVIS 1-2V
1 series · 1 of 1 positions shown · non-contrast
Comparison: None.

CLINICAL DATA: Bilateral hip pain for 1 week.

EXAM:
PELVIS - 1-2 VIEW

[pelvis ap]
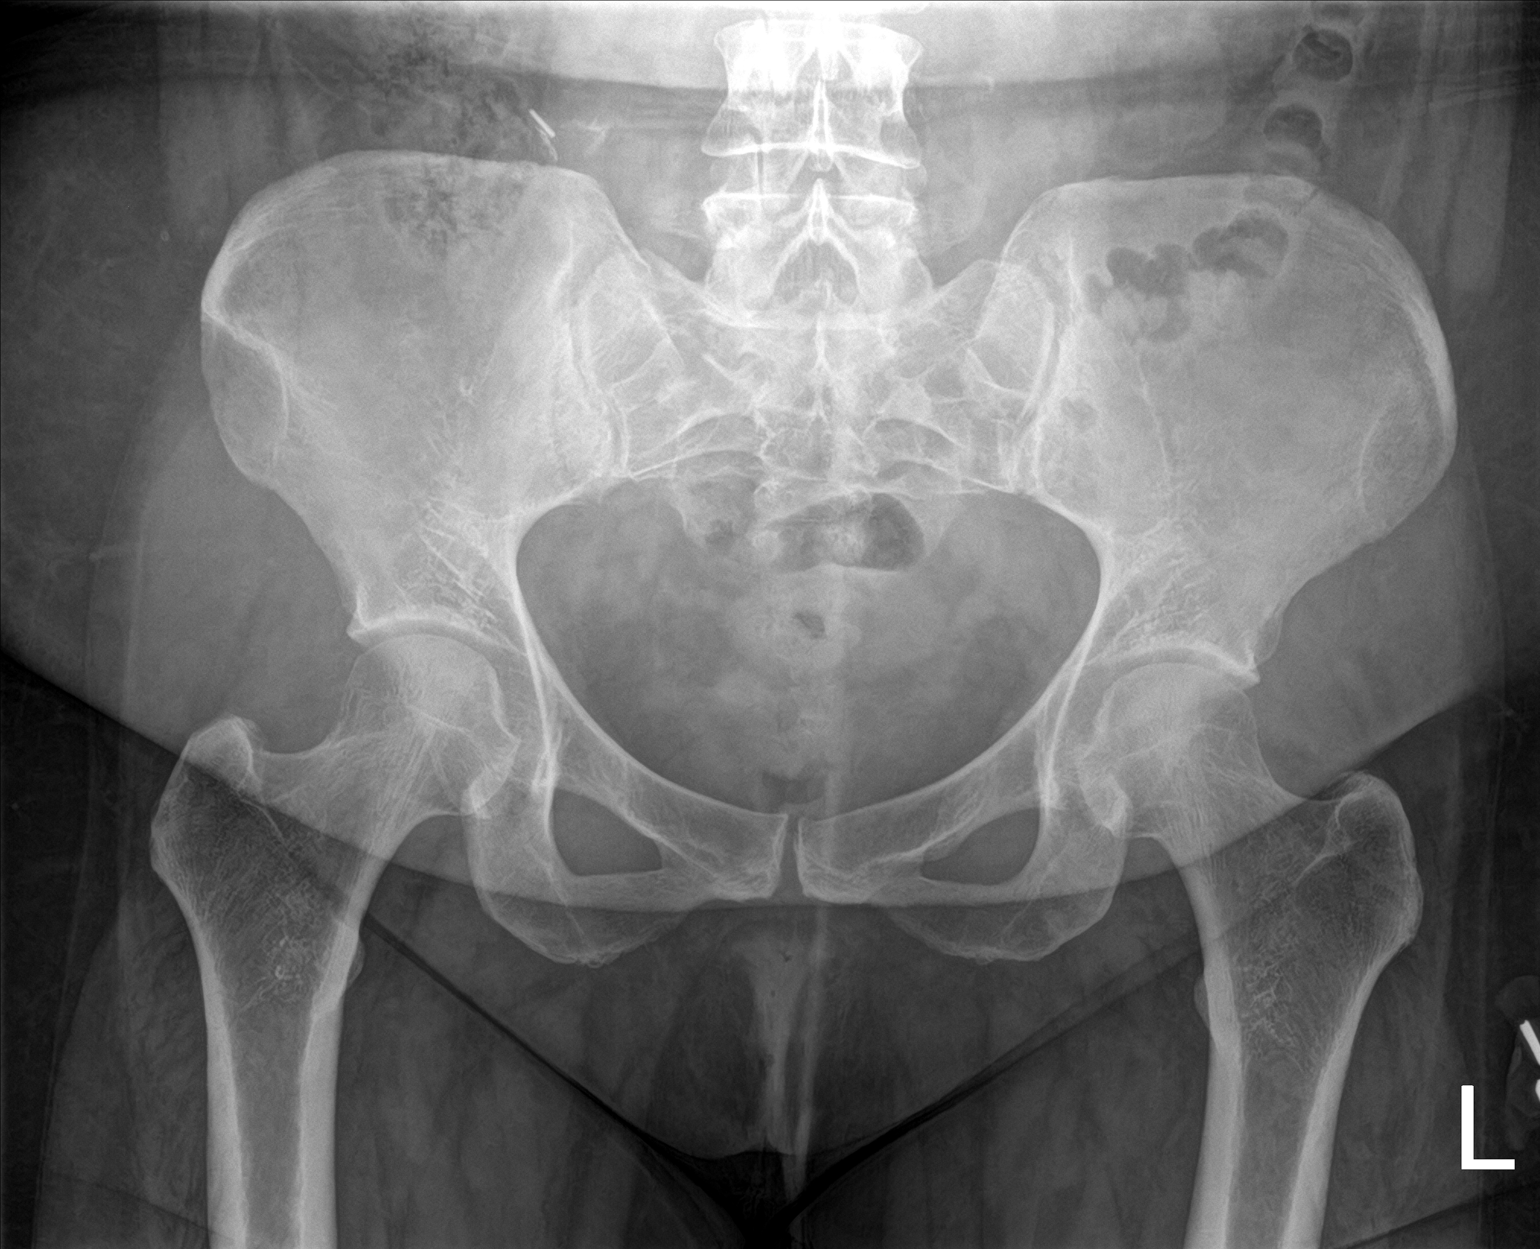

[1 of 1 positions shown; findings below may reference images not displayed]

FINDINGS: The cortical margins of the bony pelvis are intact. No fracture.
Pubic symphysis and sacroiliac joints are congruent. Both femoral
heads are well-seated in the respective acetabula. Right lower
quadrant surgical clips likely from prior appendectomy.
IMPRESSION: Negative AP view of the pelvis.

## 2019-05-28 ENCOUNTER — Ambulatory Visit: Payer: BC Managed Care – PPO | Admitting: Family Medicine

## 2019-05-28 ENCOUNTER — Encounter: Payer: Self-pay | Admitting: Family Medicine

## 2019-05-28 ENCOUNTER — Other Ambulatory Visit: Payer: Self-pay

## 2019-05-28 ENCOUNTER — Ambulatory Visit: Payer: Self-pay

## 2019-05-28 VITALS — BP 130/90 | HR 99 | Ht 64.0 in | Wt 257.0 lb

## 2019-05-28 DIAGNOSIS — G8929 Other chronic pain: Secondary | ICD-10-CM | POA: Diagnosis not present

## 2019-05-28 DIAGNOSIS — M25512 Pain in left shoulder: Secondary | ICD-10-CM

## 2019-05-28 DIAGNOSIS — M75112 Incomplete rotator cuff tear or rupture of left shoulder, not specified as traumatic: Secondary | ICD-10-CM

## 2019-05-28 MED ORDER — DICLOFENAC SODIUM 2 % TD SOLN: 2.0000 g | Freq: Two times a day (BID) | TRANSDERMAL | 3 refills | Status: DC

## 2019-05-28 NOTE — Patient Instructions (Signed)
Good to see you  Ice is your friend pennsaid pinkie amount topically 2 times daily as needed.  Exercises 3 times a week.  Should do much better in 2 weeks See me again in 5-6 weeks

## 2019-05-28 NOTE — Progress Notes (Signed)
Tawana ScaleZach Margurite Duffy D.O. [facility] [address] [place], [place] [address] Phone: [phone] Subjective:   I [name] am serving as a Neurosurgeonscribe for Dr. Antoine PrimasZachary Galit Urich.   CC: Shoulder pain  BJY:[id number]:Subjective   08/14/2018: Patient has had tightness of the neck but never any radicular symptoms.  Now patient has a positive Spurling's test with some mild weakness noted in the C7 distribution.  Concern for partial herniated disc.  Before patient undergoes any surgical intervention for the shoulder I would like an MRI of the neck to further evaluate if any of the neurologic component could be potentially contributing.  Patient will follow-up again in 4 weeks  Intrasubstance tearing noted.  Patient has failed all conservative therapy.  Has had this pain for multiple months at this time.  Does not seem to be resolving.  Patient would consider surgical intervention and will be referred to discuss the possibility of this.  Update 05/28/2019: Alicia Cox is a 48 y.o. female coming in with complaint of left shoulder pain.  Seen 10 months ago for a partial tear of the rotator cuff. Shoulder is not feeling better. Had a cyst in her neck removed recently.      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  No cervical radiculopathy at that time.  MRI of the neck showed small central disc protrusion at 6.  Also independently visualized by me with patient's MRI of the left shoulder showing a tiny intrasubstance tear of the distal supraspinatus tendon.  Past Medical History:  Diagnosis Date  . Acute bronchitis 11/03/2013  . Allergy    hay fever  . Arthritis   . Blood in stool   . Cervical cancer screening 09/14/2016   Menarche at 13 Regular and heavy flow no history of abnormal pap in past G2P2, s/p 2 svd No history of abnormal MGM No concerns today  gyn surgeries: tubal and endometrial ablation LMP 09/13/2016  . Chest pain  08/24/2013  . Chicken pox as a child  . Dermatitis 03/02/2016  . Frequent headaches   . GERD (gastroesophageal reflux disease)   . Heart murmur   . Hypothyroidism    Hashimoto's  . Knee pain, left 01/09/2014  . Low back pain 08/26/2017  . Migraine   . Obesity 11/15/2014  . Preventative health care 09/12/2015  . TOS (thoracic outlet syndrome) 08/25/2013  . UTI (urinary tract infection)   . Vitamin D deficiency    Past Surgical History:  Procedure Laterality Date  . APPENDECTOMY    . CESAREAN SECTION     twice  . CHOLECYSTECTOMY    . ENDOMETRIAL ABLATION    . TONSILLECTOMY    . TUBAL LIGATION  1999   Social History   Socioeconomic History  . Marital status: Married    Spouse name: Not on file  . Number of children: Not on file  . Years of education: Not on file  . Highest education level: Not on file  Occupational History  . Not on file  Social Needs  . Financial resource strain: Not on file  . Food insecurity:    Worry: Not on file    Inability: Not on file  . Transportation needs:    Medical: Not on file    Non-medical: Not on file  Tobacco Use  . Smoking status: Never Smoker  . Smokeless tobacco: Never Used  . Tobacco comment: NEVER USED TOBACCO  Substance and Sexual Activity  . Alcohol use: No    Alcohol/week: 0.0 standard drinks  . Drug use: No  . 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 Physical activity:    Days per week: Not on file    Minutes per session: Not on file  . Stress: Not on file  Relationships  . Social connections:    Talks on phone: Not on file    Gets together: Not on file    Attends religious service: Not on file    Active member of club or organization: Not on file    Attends meetings of clubs or organizations: Not on file    Relationship status: Not on file  Other Topics Concern  . Not on file  Social History Narrative  . Not on file    No Known Allergies Family History  Problem Relation Age of Onset  . Hyperlipidemia Mother   . Hypertension Mother   . Skin cancer Mother   . Diabetes Father        type 2  . Hypertension Sister   . Diabetes Sister   . Asthma Daughter   . Heart disease Maternal Grandmother   . Heart attack Maternal Grandfather   . Diabetes Paternal Grandmother   . Diabetes Paternal Grandfather     Current Outpatient Medications (Endocrine & Metabolic):  .  levothyroxine (SYNTHROID, LEVOTHROID) 100 MCG tablet, TAKE ONE TABLET BY MOUTH ONCE DAILY EXCEPT TAKE ONE AND ONE-HALF TABLETS ON THURSDAYS AND SUNDAYS   Current Outpatient Medications (Respiratory):  .  albuterol (VENTOLIN HFA) 108 (90 Base) MCG/ACT inhaler, INHALE 2 PUFFS BY MOUTH INTO THE LUNGS EVERY 6 (SIX) HOURS AS NEEDED FOR WHEEZING OR SHORTNESS OF BREATH. .  montelukast (SINGULAIR) 10 MG tablet, TAKE 1 TABLET (10 MG TOTAL) BY MOUTH AT BEDTIME.    Current Outpatient Medications (Other):  Marland Kitchen.  Diclofenac Sodium (PENNSAID) 2 % SOLN, Place 2 application onto the skin 2 (two) times daily. Marland Kitchen.  escitalopram (LEXAPRO) 10 MG tablet, TAKE 1 TABLET (10 MG TOTAL) BY MOUTH DAILY. Marland Kitchen.  gabapentin (NEURONTIN) 300 MG capsule, nightly .  tiZANidine (ZANAFLEX) 4 MG tablet, Take 0.5-1 tablets (2-4 mg total) by mouth every 8 (eight) hours as needed for muscle spasms. .  Diclofenac Sodium (PENNSAID) 2 % SOLN, Place 2 g onto the skin 2 (two) times daily.    Past medical history, social, surgical and family history all reviewed in electronic medical record.  No pertanent information unless stated regarding to the chief complaint.   Review of Systems:  No headache, visual changes, nausea, vomiting, diarrhea, constipation, dizziness, abdominal pain, skin rash, fevers, chills, night sweats, weight loss, swollen lymph nodes, body aches, joint swelling, muscle aches, chest pain, shortness of breath, mood changes.   Objective  Blood pressure 130/90, pulse 99,  height 5\' 4"  (1.626 m), weight 257 lb (116.6 kg), SpO2 96 %.   General: No apparent distress alert and oriented x3 mood and affect normal, dressed appropriately.  HEENT: Pupils equal, extraocular movements intact  Respiratory: Patient's speak in full sentences and does not appear short of breath  Cardiovascular: No lower extremity edema, non tender, no erythema  Skin: Warm dry intact with no signs of infection or rash on extremities or on axial skeleton.  Abdomen: Soft nontender  Neuro: Cranial nerves II through XII are intact, neurovascularly intact in all extremities with  2+ DTRs and 2+ pulses.  Lymph: No lymphadenopathy of posterior or anterior cervical chain or axillae bilaterally.  Gait normal with good balance and coordination.  MSK:  Non tender with full range of motion and good stability and symmetric strength and tone of  elbows, wrist, hip, knee and ankles bilaterally.  Shoulder: left Inspection reveals no abnormalities, atrophy or asymmetry. Palpation is normal with no tenderness over AC joint or bicipital groove. ROM is full in all planes passively. Rotator cuff strength normal throughout. signs of impingement with positive Neer and Hawkin's tests, but negative empty can sign. Speeds and Yergason's tests normal. No labral pathology noted with negative Obrien's, negative clunk and good stability. Normal scapular function observed. No painful arc and no drop arm sign. No apprehension sign Contralateral shoulder unremarkable  MSK US performed of: left This study was ordered, performed, and interpreted by Charlann Boxer D.O.  Shoulder:   Supraspinatus:  Appears normal on long and transverse views, Bursal bulge seen with shoulder abduction on impingement view. Infraspinatus:  Appears normal on long and transverse views. Significant increase in Doppler flow Subscapularis:  Appears normal on long and transverse views. Positive bursa Teres Minor:  Appears normal on long and transverse  views. AC joint:  Capsule undistended, no geyser sign. Glenohumeral Joint:  Appears normal without effusion. Glenoid Labrum:  Intact without visualized tears. Biceps Tendon:  Appears normal on long and transverse views, no fraying of tendon, tendon located in intertubercular groove, no subluxation with shoulder internal or external rotation.  Impression: Subacromial bursitis mild tendinopathy  Procedure: Real-time Ultrasound Guided Injection of left glenohumeral joint Device: GE Logiq E  Ultrasound guided injection is preferred based studies that show increased duration, increased effect, greater accuracy, decreased procedural pain, increased response rate with ultrasound guided versus blind injection.  Verbal informed consent obtained.  Time-out conducted.  Noted no overlying erythema, induration, or other signs of local infection.  Skin prepped in a sterile fashion.  Local anesthesia: Topical Ethyl chloride.  With sterile technique and under real time ultrasound guidance:  Joint visualized.  23g 1  inch needle inserted posterior approach. Pictures taken for needle placement. Patient did have injection of 2 cc of 1% lidocaine, 2 cc of 0.5% Marcaine, and 1.0 cc of Kenalog 40 mg/dL. Completed without difficulty  Pain immediately resolved suggesting accurate placement of the medication.  Advised to call if fevers/chills, erythema, induration, drainage, or persistent bleeding.  Images permanently stored and available for review in the ultrasound unit.  Impression: Technically successful ultrasound guided injection.     Impression and Recommendations:     This case required medical decision making of moderate complexity. The above documentation has been reviewed and is accurate and complete Lyndal Pulley, DO       Note: This dictation was prepared with Dragon dictation along with smaller phrase technology. Any transcriptional errors that result from this process are unintentional.

## 2019-05-28 NOTE — Assessment & Plan Note (Signed)
Patient given injection and tolerated procedure well.  We discussed icing regimen and home exercises.  We discussed which activities to do which also avoid.  Discussed posture and ergonomics.  Patient is to increase activity slowly over the course the next several weeks.  Follow-up with me again 6 weeks

## 2019-06-16 ENCOUNTER — Other Ambulatory Visit: Payer: Self-pay | Admitting: Family Medicine

## 2019-06-16 MED FILL — MONTELUKAST SOD 10 MG TAB: 10 | 90 days supply | Qty: 90 | Fill #0

## 2019-06-16 MED FILL — ESCITALOPRAM 10 MG TABLET: 10 | 90 days supply | Qty: 90 | Fill #1

## 2019-07-02 ENCOUNTER — Ambulatory Visit: Payer: BC Managed Care – PPO | Admitting: Family Medicine

## 2019-07-02 NOTE — Progress Notes (Deleted)
[facility] [address] [place], [place] [address] Phone: [phone] Subjective:    I'm seeing this patient by the request  of:    CC: Left shoulder pain follow-up  PPI:[id number]  Alicia Cox is a 48 y.o. female coming in with complaint of ***  Onset-  Location Duration-  Character- Aggravating factors- Reliving factors-  Therapies tried-  Severity-     Past Medical History:  Diagnosis Date  . Acute bronchitis 11/03/2013  . Allergy    hay fever  . Arthritis   . Blood in stool   . Cervical cancer screening 09/14/2016   Menarche at 13 Regular and heavy flow no history of abnormal pap in past G2P2, s/p 2 svd No history of abnormal MGM No concerns today  gyn surgeries: tubal and endometrial ablation LMP 09/13/2016  . Chest pain 08/24/2013  . Chicken pox as a child  . Dermatitis 03/02/2016  . Frequent headaches   . GERD (gastroesophageal reflux disease)   . Heart murmur   . Hypothyroidism    Hashimoto's  . Knee pain, left 01/09/2014  . Low back pain 08/26/2017  . Migraine   . Obesity 11/15/2014  . Preventative health care 09/12/2015  . TOS (thoracic outlet syndrome) 08/25/2013  . UTI (urinary tract infection)   . Vitamin D deficiency    Past Surgical History:  Procedure Laterality Date  . APPENDECTOMY    . CESAREAN SECTION     twice  . CHOLECYSTECTOMY    . ENDOMETRIAL ABLATION    . TONSILLECTOMY    . TUBAL LIGATION  1999   Social History   Socioeconomic History  . Marital status: Married    Spouse name: Not on file  . Number of children: Not on file  . Years of education: Not on file  . Highest education level: Not on file  Occupational History  . Not on file  Social Needs  . Financial resource strain: Not on file  . Food insecurity    Worry: Not on file    Inability: Not on file  . Transportation needs    Medical: Not on file    Non-medical: Not on file  Tobacco Use  . Smoking status: Never Smoker  . Smokeless  tobacco: Never Used  . Tobacco comment: NEVER USED TOBACCO  Substance and Sexual Activity  . Alcohol use: No    Alcohol/week: 0.0 standard drinks  . Drug use: No  . 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 Physical activity    Days per week: Not on file    Minutes per session: Not on file  . Stress: Not on file  Relationships  . Social Herbalist on phone: Not on file    Gets together: Not on file    Attends religious service: Not on file    Active member of club or organization: Not on file    Attends meetings of clubs or organizations: Not on file    Relationship status: Not on file  Other Topics Concern  . Not on file  Social History Narrative  . Not on file   No Known Allergies Family History  Problem Relation Age of Onset  . Hyperlipidemia Mother   . Hypertension Mother   . Skin cancer Mother   . Diabetes Father  type 2  . Hypertension Sister   . Diabetes Sister   . Asthma Daughter   . Heart disease Maternal Grandmother   . Heart attack Maternal Grandfather   . Diabetes Paternal Grandmother   . Diabetes Paternal Grandfather     Current Outpatient Medications (Endocrine & Metabolic):  .  levothyroxine (SYNTHROID, LEVOTHROID) 100 MCG tablet, TAKE ONE TABLET BY MOUTH ONCE DAILY EXCEPT TAKE ONE AND ONE-HALF TABLETS ON THURSDAYS AND SUNDAYS   Current Outpatient Medications (Respiratory):  .  albuterol (VENTOLIN HFA) 108 (90 Base) MCG/ACT inhaler, INHALE 2 PUFFS BY MOUTH INTO THE LUNGS EVERY 6 (SIX) HOURS AS NEEDED FOR WHEEZING OR SHORTNESS OF BREATH. .  montelukast (SINGULAIR) 10 MG tablet, TAKE 1 TABLET (10 MG TOTAL) BY MOUTH AT BEDTIME.    Current Outpatient Medications (Other):  Marland Kitchen.  Diclofenac Sodium (PENNSAID) 2 % SOLN, Place 2 application onto the skin 2 (two) times daily. .  Diclofenac Sodium (PENNSAID) 2 % SOLN, Place 2 g onto the skin 2 (two)  times daily. Marland Kitchen.  escitalopram (LEXAPRO) 10 MG tablet, TAKE 1 TABLET (10 MG TOTAL) BY MOUTH DAILY. Marland Kitchen.  gabapentin (NEURONTIN) 300 MG capsule, nightly .  tiZANidine (ZANAFLEX) 4 MG tablet, Take 0.5-1 tablets (2-4 mg total) by mouth every 8 (eight) hours as needed for muscle spasms.    Past medical history, social, surgical and family history all reviewed in electronic medical record.  No pertanent information unless stated regarding to the chief complaint.   Review of Systems:  No headache, visual changes, nausea, vomiting, diarrhea, constipation, dizziness, abdominal pain, skin rash, fevers, chills, night sweats, weight loss, swollen lymph nodes, body aches, joint swelling, muscle aches, chest pain, shortness of breath, mood changes.   Objective  There were no vitals taken for this visit. Systems examined below as of    General: No apparent distress alert and oriented x3 mood and affect normal, dressed appropriately.  HEENT: Pupils equal, extraocular movements intact  Respiratory: Patient's speak in full sentences and does not appear short of breath  Cardiovascular: No lower extremity edema, non tender, no erythema  Skin: Warm dry intact with no signs of infection or rash on extremities or on axial skeleton.  Abdomen: Soft nontender  Neuro: Cranial nerves II through XII are intact, neurovascularly intact in all extremities with 2+ DTRs and 2+ pulses.  Lymph: No lymphadenopathy of posterior or anterior cervical chain or axillae bilaterally.  Gait normal with good balance and coordination.  MSK:  Non tender with full range of motion and good stability and symmetric strength and tone of  elbows, wrist, hip, knee and ankles bilaterally.  Shoulder: Inspection reveals no abnormalities, atrophy or asymmetry. Palpation is normal with no tenderness over AC joint or bicipital groove. ROM is full in all planes. Rotator cuff strength normal throughout. No signs of impingement with negative Neer and  Hawkin's tests, empty can sign. Speeds and Yergason's tests normal. No labral pathology noted with negative Obrien's, negative clunk and good stability. Normal scapular function observed. No painful arc and no drop arm sign. No apprehension sign  Neck: Inspection unremarkable. No palpable stepoffs. Negative Spurling's maneuver. Full neck range of motion Grip strength and sensation normal in bilateral hands Strength good C4 to T1 distribution No sensory change to C4 to T1 Negative Hoffman sign bilaterally Reflexes normal  MSK US performed of: *** This study was ordered, performed, and interpreted by Terrilee FilesZach Cox D.O.  Shoulder:   Supraspinatus:  Appears normal on long and transverse views, no  bursal bulge seen with shoulder abduction on impingement view. Infraspinatus:  Appears normal on long and transverse views. Subscapularis:  Appears normal on long and transverse views. Teres Minor:  Appears normal on long and transverse views. AC joint:  Capsule undistended, no geyser sign. Glenohumeral Joint:  Appears normal without effusion. Glenoid Labrum:  Intact without visualized tears. Biceps Tendon:  Appears normal on long and transverse views, no fraying of tendon, tendon located in intertubercular groove, no subluxation with shoulder internal or external rotation. No increased power doppler signal.   Impression and Recommendations:     This case required medical decision making of moderate complexity. The above documentation has been reviewed and is accurate and complete Alicia SaaZachary M Smith, DO       Note: This dictation was prepared with Dragon dictation along with smaller phrase technology. Any transcriptional errors that result from this process are unintentional.

## 2019-08-03 ENCOUNTER — Encounter: Payer: Self-pay | Admitting: Family Medicine

## 2019-08-04 ENCOUNTER — Other Ambulatory Visit: Payer: Self-pay | Admitting: Family Medicine

## 2019-08-04 MED ORDER — DICYCLOMINE HCL 10 MG PO CAPS: 10.0000 mg | ORAL_CAPSULE | Freq: Three times a day (TID) | ORAL | 0 refills | Status: DC | PRN

## 2019-09-17 ENCOUNTER — Other Ambulatory Visit: Payer: Self-pay | Admitting: Family Medicine

## 2019-09-17 MED FILL — ESCITALOPRAM 10 MG TABLET: 10 | 90 days supply | Qty: 90 | Fill #0

## 2019-09-26 ENCOUNTER — Other Ambulatory Visit: Payer: Self-pay

## 2019-09-26 ENCOUNTER — Ambulatory Visit (INDEPENDENT_AMBULATORY_CARE_PROVIDER_SITE_OTHER): Payer: BC Managed Care – PPO

## 2019-09-26 DIAGNOSIS — Z23 Encounter for immunization: Secondary | ICD-10-CM

## 2019-10-29 IMAGING — US US EXTREM LOW VENOUS*R*
1 series · 13 of 24 positions shown · non-contrast
Comparison: None.

CLINICAL DATA: Right calf pain since this past [REDACTED]. History of
prior left lower extremity DVT. Evaluate for acute or chronic DVT.



[Series 1: us extrem low venous*right* · 0.09mm/px · 13 of 40 slices shown]
[im 1/40]
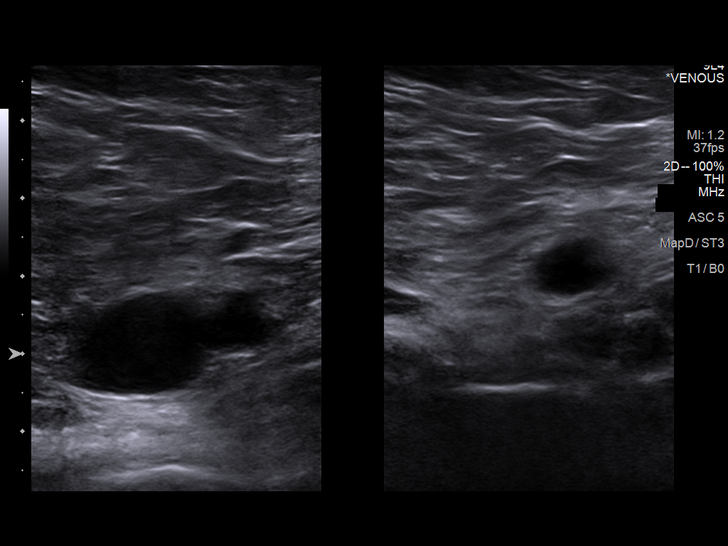
[im 4/40]
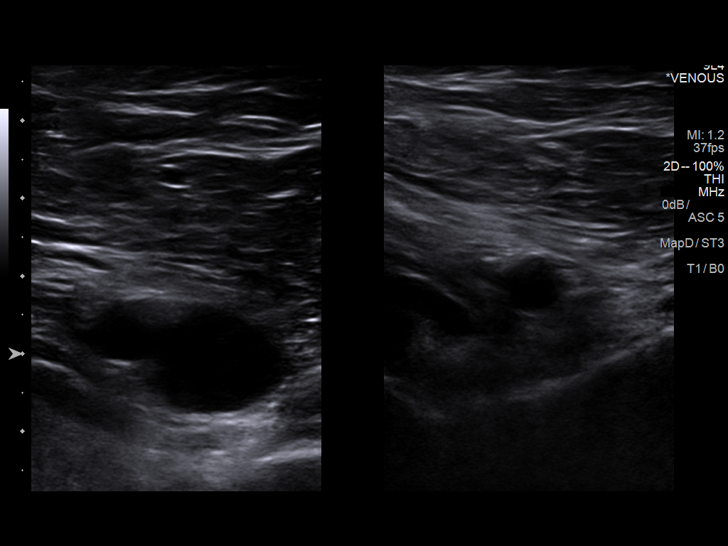
[im 7/40]
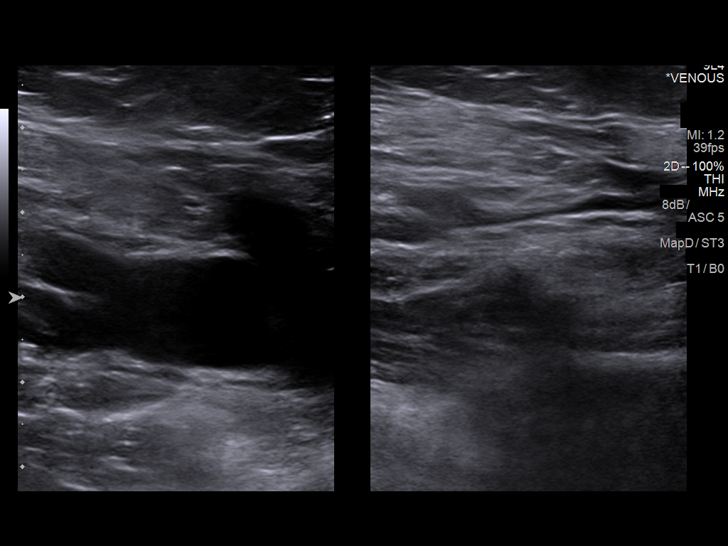
[im 11/40]
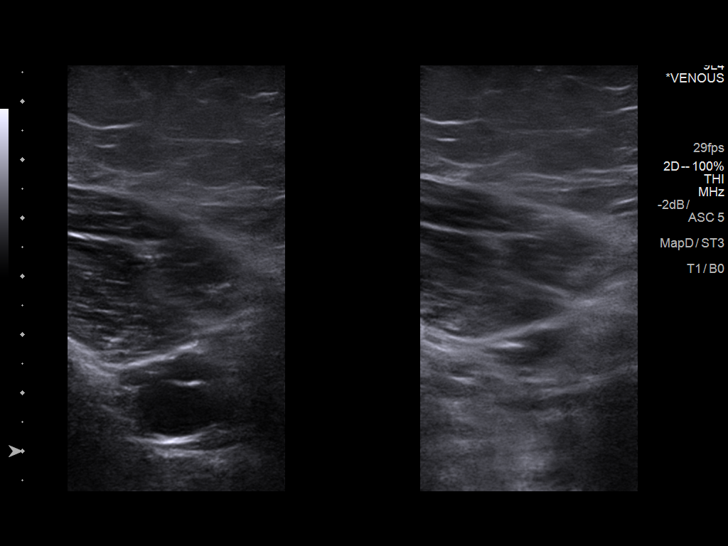
[im 14/40]
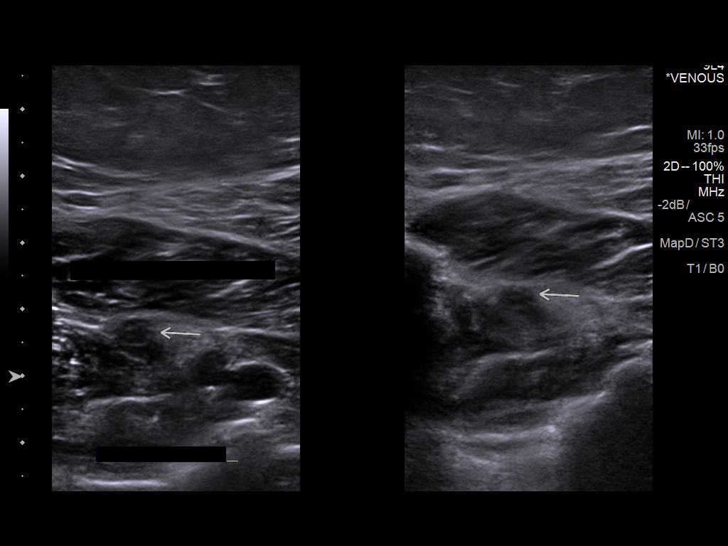
[im 17/40]
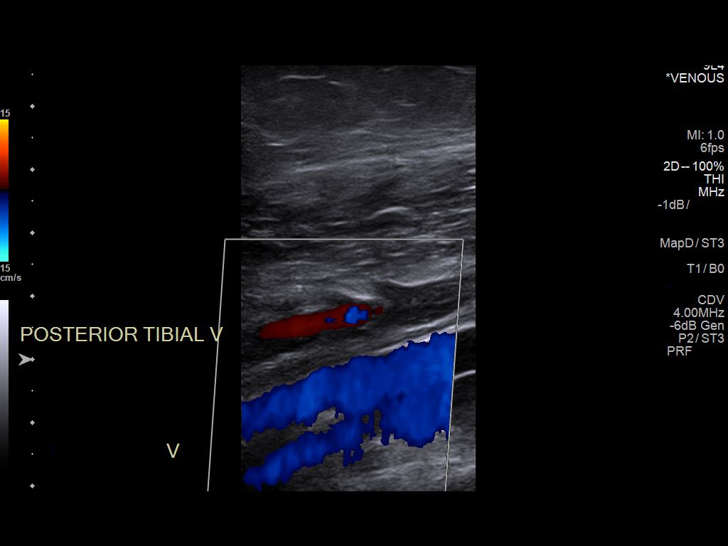
[im 21/40]
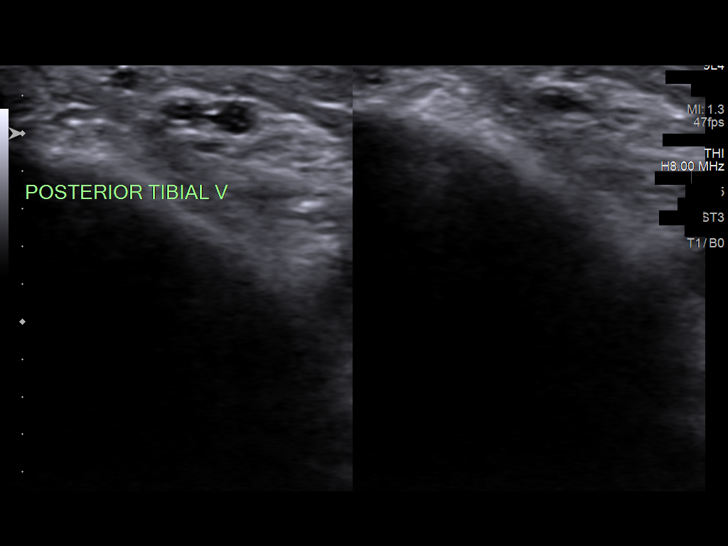
[im 23/40]
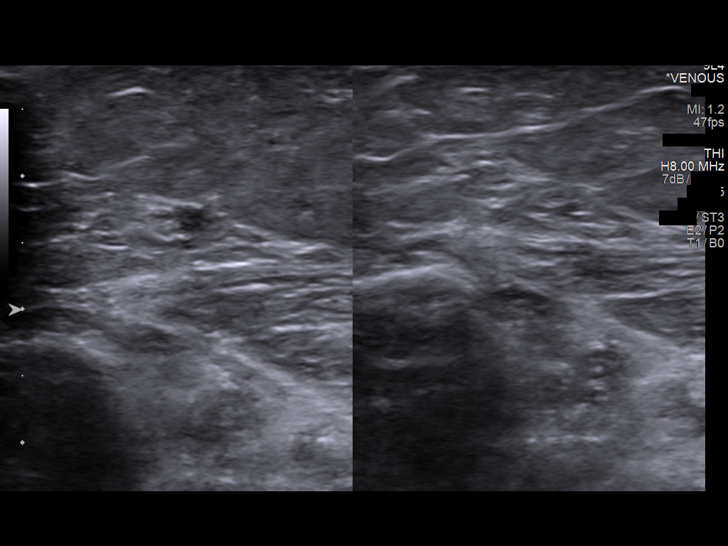
[im 26/40]
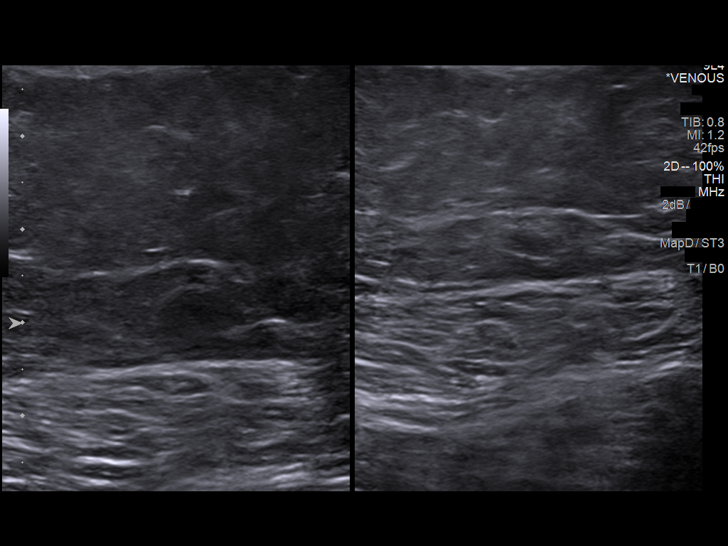
[im 29/40]
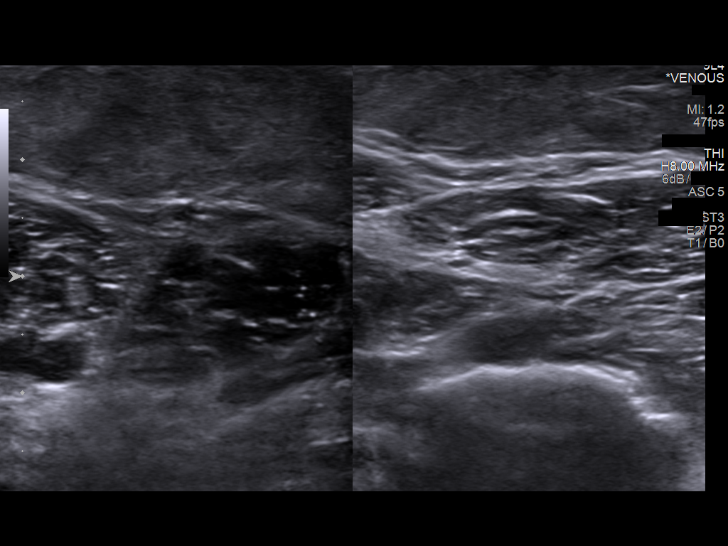
[im 33/40]
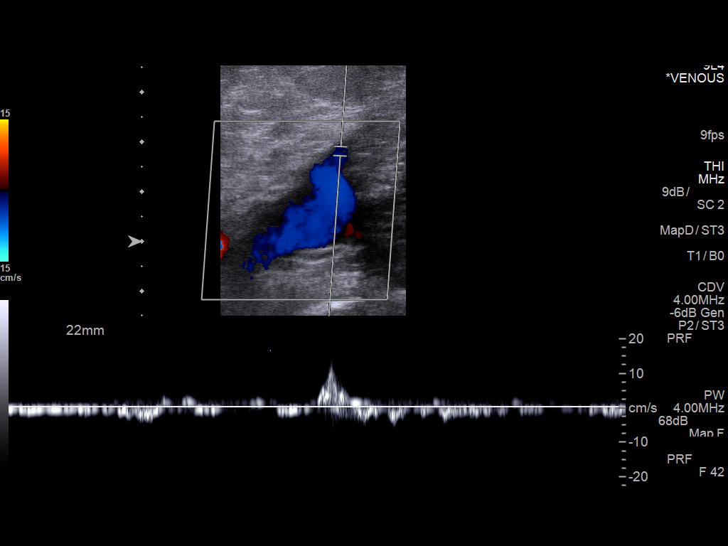
[im 36/40]
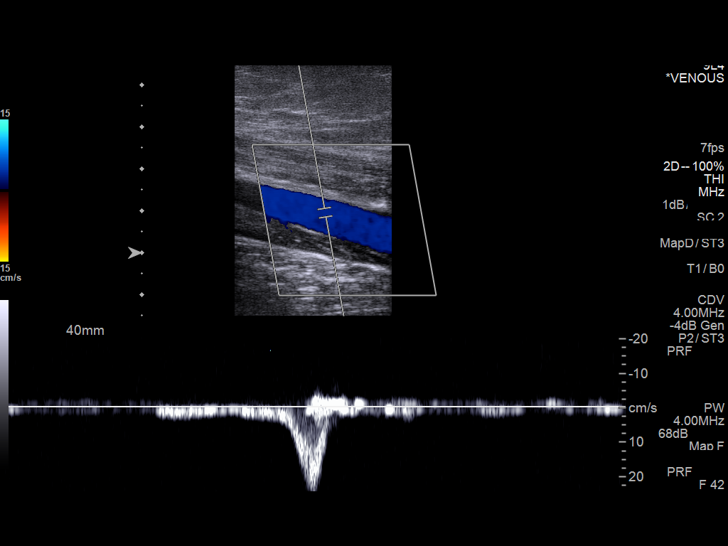
[im 40/40]
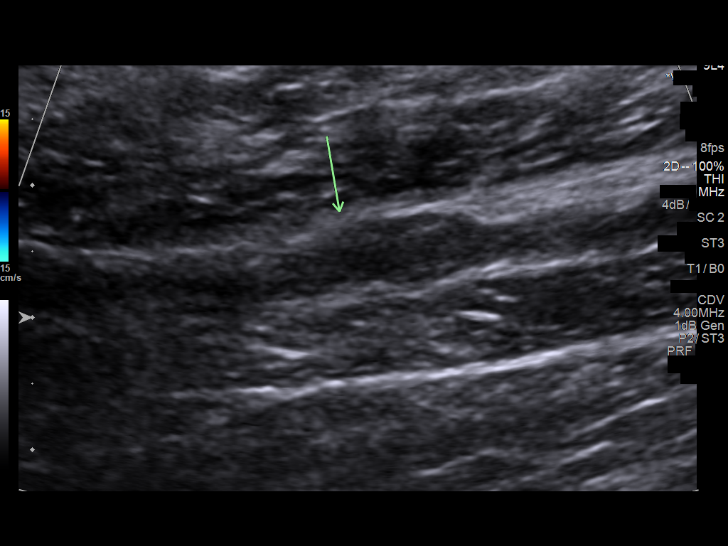

[13 of 24 positions shown; findings below may reference images not displayed]

FINDINGS: Contralateral Common Femoral Vein: Respiratory phasicity is normal
and symmetric with the symptomatic side. No evidence of thrombus.
Normal compressibility.

Common Femoral Vein: No evidence of thrombus. Normal
compressibility, respiratory phasicity and response to augmentation.

Saphenofemoral Junction: No evidence of thrombus. Normal
compressibility and flow on color Doppler imaging.

Profunda Femoral Vein: No evidence of thrombus. Normal
compressibility and flow on color Doppler imaging.

Femoral Vein: No evidence of thrombus. Normal compressibility,
respiratory phasicity and response to augmentation.

Popliteal Vein: No evidence of thrombus. Normal compressibility,
respiratory phasicity and response to augmentation.

Calf Veins: There is age-indeterminate mixed echogenic occlusive
thrombus within both posterior tibial veins. The peroneal veins
appear widely patent where imaged.

Superficial Great Saphenous Vein: No evidence of thrombus. Normal
compressibility.

Other Findings:  None.
IMPRESSION: Examination is positive for age-indeterminate occlusive DVT
involving both paired posterior tibial veins. In the absence of
prior examinations, an acute on chronic process is not excluded.
There is no extension of this distal calf DVT to the more proximal
venous system of the right lower extremity.

## 2019-11-28 ENCOUNTER — Other Ambulatory Visit: Payer: Self-pay | Admitting: Medical

## 2019-11-28 DIAGNOSIS — J101 Influenza due to other identified influenza virus with other respiratory manifestations: Secondary | ICD-10-CM | POA: Diagnosis not present

## 2019-11-28 DIAGNOSIS — Z1159 Encounter for screening for other viral diseases: Secondary | ICD-10-CM | POA: Diagnosis not present

## 2019-11-28 MED ORDER — ALBUTEROL SULFATE HFA 108 (90 BASE) MCG/ACT IN AERS: 2.0000 | INHALATION_SPRAY | Freq: Four times a day (QID) | RESPIRATORY_TRACT | 5 refills | Status: DC | PRN

## 2019-11-28 MED FILL — VENTOLIN HFA 90 MCG INHALER: 108 (90 BAS | 25 days supply | Qty: 18 | Fill #0

## 2019-12-04 DIAGNOSIS — R05 Cough: Secondary | ICD-10-CM | POA: Diagnosis not present

## 2019-12-09 ENCOUNTER — Ambulatory Visit (INDEPENDENT_AMBULATORY_CARE_PROVIDER_SITE_OTHER): Payer: BC Managed Care – PPO | Admitting: Family Medicine

## 2019-12-09 ENCOUNTER — Other Ambulatory Visit: Payer: Self-pay

## 2019-12-09 DIAGNOSIS — T7840XS Allergy, unspecified, sequela: Secondary | ICD-10-CM | POA: Diagnosis not present

## 2019-12-09 DIAGNOSIS — R739 Hyperglycemia, unspecified: Secondary | ICD-10-CM

## 2019-12-09 DIAGNOSIS — J40 Bronchitis, not specified as acute or chronic: Secondary | ICD-10-CM

## 2019-12-09 DIAGNOSIS — K219 Gastro-esophageal reflux disease without esophagitis: Secondary | ICD-10-CM | POA: Diagnosis not present

## 2019-12-09 MED ORDER — METHYLPREDNISOLONE 4 MG PO TABS: ORAL_TABLET | ORAL | 0 refills | Status: DC

## 2019-12-09 MED ORDER — DOXYCYCLINE HYCLATE 100 MG PO TABS: 100.0000 mg | ORAL_TABLET | Freq: Two times a day (BID) | ORAL | 0 refills | Status: DC

## 2019-12-09 MED ORDER — FAMOTIDINE 40 MG PO TABS: 40.0000 mg | ORAL_TABLET | Freq: Every day | ORAL | 1 refills | Status: DC

## 2019-12-10 NOTE — Assessment & Plan Note (Addendum)
Has been treated with Prednisone, Zpak and hydromet and is only partially improved. The hydromet is helping her sleep and she has plenty, is given a course of Doxycycline and if she worsens when she finishes her Prednisone she is also given a prescription for a medrol dosepak. Encouraged increased rest and hydration, add probiotics, zinc such as Coldeze or Xicam. Treat fevers as needed, mucinex bid. She hs been COVID negative and none of her family members have become ill

## 2019-12-10 NOTE — Assessment & Plan Note (Signed)
Avoid offending foods, start probiotics. Do not eat large meals in late evening and consider raising head of bed. Added Famotidine 40 mg at bedtime to see if that can help with the cough

## 2019-12-10 NOTE — Progress Notes (Signed)
Virtual Visit via Video Note  I connected with Alicia Cox on 12/09/19 at  1:20 PM EST by a video enabled telemedicine application and verified that I am speaking with the correct person using two identifiers.  Location: Patient: home Provider: home   I discussed the limitations of evaluation and management by telemedicine and the availability of in person appointments. The patient expressed understanding and agreed to proceed. [name], CMA was able to get the patient set up on a visit, video   Subjective:    Patient ID: Alicia Cox, female    DOB: 12-29-1970, 48 y.o.   MRN: [id number]  Chief Complaint  Patient presents with  . Asthma    went to urgent care on 12/16 &12/17, just finishe up zpak, on prednisone and cough syrup    HPI Patient is in today for follow up on persistent cough. She has been sick all month. Initially she was seen by UC and was diagnosed with influenza B, she chose not to take Tamiflu. She notes fevers, malaise, cough, congestion, headache. Most symptoms have improved but the cough and congestion persist. Hydromet has helped her sleep some but she just cannot fully recover. Denies CP/palp/SOB/GI or GU c/o. Taking meds as prescribed  Past Medical History:  Diagnosis Date  . Acute bronchitis 11/03/2013  . Allergy    hay fever  . Arthritis   . Blood in stool   . Cervical cancer screening 09/14/2016   Menarche at 13 Regular and heavy flow no history of abnormal pap in past G2P2, s/p 2 svd No history of abnormal MGM No concerns today  gyn surgeries: tubal and endometrial ablation LMP 09/13/2016  . Chest pain 08/24/2013  . Chicken pox as a child  . Dermatitis 03/02/2016  . Frequent headaches   . GERD (gastroesophageal reflux disease)   . Heart murmur   . Hypothyroidism    Hashimoto's  . Knee pain, left 01/09/2014  . Low back pain 08/26/2017  . Migraine   . Obesity 11/15/2014  . Preventative health care 09/12/2015  . TOS (thoracic outlet  syndrome) 08/25/2013  . UTI (urinary tract infection)   . Vitamin D deficiency     Past Surgical History:  Procedure Laterality Date  . APPENDECTOMY    . CESAREAN SECTION     twice  . CHOLECYSTECTOMY    . ENDOMETRIAL ABLATION    . TONSILLECTOMY    . TUBAL LIGATION  1999    Family History  Problem Relation Age of Onset  . Hyperlipidemia Mother   . Hypertension Mother   . Skin cancer Mother   . Diabetes Father        type 2  . Hypertension Sister   . Diabetes Sister   . Asthma Daughter   . Heart disease Maternal Grandmother   . Heart attack Maternal Grandfather   . Diabetes Paternal Grandmother   . Diabetes Paternal Grandfather     Social History   Socioeconomic History  . Marital status: Married    Spouse name: Not on file  . Number of children: Not on file  . Years of education: Not on file  . Highest education level: Not on file  Occupational History  . Not on file  Tobacco Use  . Smoking status: Never Smoker  . Smokeless tobacco: Never Used  . Tobacco comment: NEVER USED TOBACCO  Substance and Sexual Activity  . Alcohol use: No    Alcohol/week: 0.0 standard drinks  . Drug use: No  .  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 Not on file  Social History Narrative  . Not on file   Social Determinants of Health   Financial Resource Strain:   . Difficulty of Paying Living Expenses: Not on file  Food Insecurity:   . Worried About Charity fundraiser in the Last Year: Not on file  . Ran Out of Food in the Last Year: Not on file  Transportation Needs:   . Lack of Transportation (Medical): Not on file  . Lack of Transportation (Non-Medical): Not on file  Physical Activity:   . Days of Exercise per Week: Not on file  . Minutes of Exercise per Session: Not on file  Stress:   . Feeling of Stress : Not on file  Social Connections:   . Frequency of  Communication with Friends and Family: Not on file  . Frequency of Social Gatherings with Friends and Family: Not on file  . Attends Religious Services: Not on file  . Active Member of Clubs or Organizations: Not on file  . Attends Archivist Meetings: Not on file  . Marital Status: Not on file  Intimate Partner Violence:   . Fear of Current or Ex-Partner: Not on file  . Emotionally Abused: Not on file  . Physically Abused: Not on file  . Sexually Abused: Not on file    Outpatient Medications Prior to Visit  Medication Sig Dispense Refill  . albuterol (VENTOLIN HFA) 108 (90 Base) MCG/ACT inhaler Inhale 2 puffs into the lungs every 6 (six) hours as needed for wheezing or shortness of breath. 18 g 5  . dicyclomine (BENTYL) 10 MG capsule Take 1 capsule (10 mg total) by mouth 3 (three) times daily as needed for spasms. 90 capsule 0  . escitalopram (LEXAPRO) 10 MG tablet TAKE 1 TABLET BY MOUTH ONCE DAILY 90 tablet 1  . gabapentin (NEURONTIN) 300 MG capsule nightly 90 capsule 3  . HYDROMET 5-1.5 MG/5ML syrup     . levothyroxine (SYNTHROID, LEVOTHROID) 100 MCG tablet TAKE ONE TABLET BY MOUTH ONCE DAILY EXCEPT TAKE ONE AND ONE-HALF TABLETS ON THURSDAYS AND SUNDAYS 105 tablet 0  . montelukast (SINGULAIR) 10 MG tablet TAKE 1 TABLET (10 MG TOTAL) BY MOUTH AT BEDTIME. 90 tablet 1  . predniSONE (STERAPRED UNI-PAK 21 TAB) 10 MG (21) TBPK tablet     . tiZANidine (ZANAFLEX) 4 MG tablet Take 0.5-1 tablets (2-4 mg total) by mouth every 8 (eight) hours as needed for muscle spasms. 40 tablet 2  . Diclofenac Sodium (PENNSAID) 2 % SOLN Place 2 application onto the skin 2 (two) times daily. 112 g 3  . Diclofenac Sodium (PENNSAID) 2 % SOLN Place 2 g onto the skin 2 (two) times daily. 112 g 3   No facility-administered medications prior to visit.    No Known Allergies  Review of Systems  Constitutional: Positive for malaise/fatigue. Negative for fever.  HENT: Positive for congestion.   Eyes:  Negative for blurred vision.  Respiratory: Positive for cough and sputum production. Negative for shortness of breath.   Cardiovascular: Negative for chest pain, palpitations and leg swelling.  Gastrointestinal: Negative for abdominal pain, blood in stool and nausea.  Genitourinary: Negative for dysuria and frequency.  Musculoskeletal: Negative for falls.  Skin: Negative for rash.  Neurological: Negative for dizziness, loss of consciousness and headaches.  Endo/Heme/Allergies: Negative for  environmental allergies.  Psychiatric/Behavioral: Negative for depression. The patient is not nervous/anxious.        Objective:    Physical Exam Constitutional:      Appearance: Normal appearance. She is not ill-appearing.  HENT:     Head: Normocephalic and atraumatic.     Nose: Nose normal.  Eyes:     General:        Right eye: No discharge.        Left eye: No discharge.  Pulmonary:     Effort: Pulmonary effort is normal.  Neurological:     Mental Status: She is alert and oriented to person, place, and time.  Psychiatric:        Mood and Affect: Mood normal.        Behavior: Behavior normal.     BP 119/73   Pulse 85   Temp 97.9 F (36.6 C) (Oral)   SpO2 95%  Wt Readings from Last 3 Encounters:  05/28/19 257 lb (116.6 kg)  09/24/18 257 lb (116.6 kg)  09/12/18 257 lb 9.6 oz (116.8 kg)    Diabetic Foot Exam - Simple   No data filed     Lab Results  Component Value Date   WBC 12.2 (H) 09/24/2018   HGB 14.5 09/24/2018   HCT 43.7 09/24/2018   PLT 360.0 09/24/2018   GLUCOSE 84 09/24/2018   CHOL 174 09/24/2018   TRIG 192.0 (H) 09/24/2018   HDL 39.30 09/24/2018   LDLDIRECT 104.0 09/17/2017   LDLCALC 97 09/24/2018   ALT 22 09/24/2018   AST 20 09/24/2018   NA 138 09/24/2018   K 4.1 Hemolyzed 09/24/2018   CL 103 09/24/2018   CREATININE 0.67 09/24/2018   BUN 10 09/24/2018   CO2 26 09/24/2018   TSH 2.64 09/24/2018   HGBA1C 5.8 09/24/2018    Lab Results  Component  Value Date   TSH 2.64 09/24/2018   Lab Results  Component Value Date   WBC 12.2 (H) 09/24/2018   HGB 14.5 09/24/2018   HCT 43.7 09/24/2018   MCV 98.7 09/24/2018   PLT 360.0 09/24/2018   Lab Results  Component Value Date   NA 138 09/24/2018   K 4.1 Hemolyzed 09/24/2018   CO2 26 09/24/2018   GLUCOSE 84 09/24/2018   BUN 10 09/24/2018   CREATININE 0.67 09/24/2018   BILITOT 0.3 09/24/2018   ALKPHOS 50 09/24/2018   AST 20 09/24/2018   ALT 22 09/24/2018   PROT 7.7 09/24/2018   ALBUMIN 4.2 09/24/2018   CALCIUM 9.3 09/24/2018   ANIONGAP 8 05/24/2018   GFR 100.46 09/24/2018   Lab Results  Component Value Date   CHOL 174 09/24/2018   Lab Results  Component Value Date   HDL 39.30 09/24/2018   Lab Results  Component Value Date   LDLCALC 97 09/24/2018   Lab Results  Component Value Date   TRIG 192.0 (H) 09/24/2018   Lab Results  Component Value Date   CHOLHDL 4 09/24/2018   Lab Results  Component Value Date   HGBA1C 5.8 09/24/2018       Assessment & Plan:   Problem List Items Addressed This Visit    GERD (gastroesophageal reflux disease)    Avoid offending foods, start probiotics. Do not eat large meals in late evening and consider raising head of bed. Added Famotidine 40 mg at bedtime to see if that can help with the cough      Relevant Medications   famotidine (PEPCID) 40 MG tablet  Allergy    Antihistamine bid and nasal steroid      Bronchitis    Has been treated with Prednisone, Zpak and hydromet and is only partially improved. The hydromet is helping her sleep and she has plenty, is given a course of Doxycycline and if she worsens when she finishes her Prednisone she is also given a prescription for a medrol dosepak. Encouraged increased rest and hydration, add probiotics, zinc such as Coldeze or Xicam. Treat fevers as needed, mucinex bid. She hs been COVID negative and none of her family members have become ill      Hyperglycemia    hgba1c acceptable,  minimize simple carbs. Increase exercise as tolerated.          I have discontinued Jaylynne Birkhead. Vig's Diclofenac Sodium and Diclofenac Sodium. I am also having her start on methylPREDNISolone, doxycycline, and famotidine. Additionally, I am having her maintain her gabapentin, tiZANidine, levothyroxine, montelukast, dicyclomine, escitalopram, albuterol, Hydromet, and predniSONE.  Meds ordered this encounter  Medications  . methylPREDNISolone (MEDROL) 4 MG tablet    Sig: 5 tab po qd X 1d then 4 tab po qd X 1d then 3 tab po qd X 1d then 2 tab po qd then 1 tab po qd    Dispense:  15 tablet    Refill:  0  . doxycycline (VIBRA-TABS) 100 MG tablet    Sig: Take 1 tablet (100 mg total) by mouth 2 (two) times daily.    Dispense:  20 tablet    Refill:  0  . famotidine (PEPCID) 40 MG tablet    Sig: Take 1 tablet (40 mg total) by mouth at bedtime.    Dispense:  30 tablet    Refill:  1     I discussed the assessment and treatment plan with the patient. The patient was provided an opportunity to ask questions and all were answered. The patient agreed with the plan and demonstrated an understanding of the instructions.   The patient was advised to call back or seek an in-person evaluation if the symptoms worsen or if the condition fails to improve as anticipated.  I provided 25 minutes of non-face-to-face time during this encounter.   [name], MD

## 2019-12-10 NOTE — Assessment & Plan Note (Signed)
Antihistamine bid and nasal steroid

## 2019-12-10 NOTE — Assessment & Plan Note (Signed)
hgba1c acceptable, minimize simple carbs. Increase exercise as tolerated.  

## 2019-12-24 MED FILL — ESCITALOPRAM 10 MG TABLET: 10 | 90 days supply | Qty: 90 | Fill #1

## 2020-02-10 ENCOUNTER — Other Ambulatory Visit: Payer: Self-pay

## 2020-02-12 ENCOUNTER — Encounter: Payer: BC Managed Care – PPO | Admitting: Family Medicine

## 2020-03-18 ENCOUNTER — Encounter: Payer: Self-pay | Admitting: Family Medicine

## 2020-03-18 ENCOUNTER — Other Ambulatory Visit: Payer: Self-pay | Admitting: Family Medicine

## 2020-03-18 ENCOUNTER — Ambulatory Visit (INDEPENDENT_AMBULATORY_CARE_PROVIDER_SITE_OTHER): Payer: 59 | Admitting: Family Medicine

## 2020-03-18 ENCOUNTER — Other Ambulatory Visit: Payer: Self-pay

## 2020-03-18 VITALS — BP 121/89 | HR 75 | Temp 97.1°F | Resp 16 | Ht 64.0 in | Wt 250.6 lb

## 2020-03-18 DIAGNOSIS — E039 Hypothyroidism, unspecified: Secondary | ICD-10-CM | POA: Diagnosis not present

## 2020-03-18 DIAGNOSIS — R739 Hyperglycemia, unspecified: Secondary | ICD-10-CM | POA: Diagnosis not present

## 2020-03-18 DIAGNOSIS — E782 Mixed hyperlipidemia: Secondary | ICD-10-CM | POA: Diagnosis not present

## 2020-03-18 DIAGNOSIS — Z1239 Encounter for other screening for malignant neoplasm of breast: Secondary | ICD-10-CM | POA: Diagnosis not present

## 2020-03-18 DIAGNOSIS — K219 Gastro-esophageal reflux disease without esophagitis: Secondary | ICD-10-CM | POA: Diagnosis not present

## 2020-03-18 DIAGNOSIS — Z0000 Encounter for general adult medical examination without abnormal findings: Secondary | ICD-10-CM | POA: Diagnosis not present

## 2020-03-18 DIAGNOSIS — D72829 Elevated white blood cell count, unspecified: Secondary | ICD-10-CM

## 2020-03-18 DIAGNOSIS — M545 Low back pain, unspecified: Secondary | ICD-10-CM

## 2020-03-18 NOTE — Patient Instructions (Signed)
Omron Blood Pressure cuff, upper arm, want BP 100-140/60-90 Pulse oximeter, want oxygen in 90s  Weekly vitals  Take Multivitamin with minerals, selenium Vitamin D 1000-2000 IU daily Probiotic with lactobacillus and bifidophilus Asprin EC 81 mg daily Fish or krill or flaxseed oil caps daily Melatonin 2-5 mg at bedtime  [email or web address]  The mRNA technology has been in development for 20 years and we already had the Coronavirus family of viruses (which usually just cause the common cold) genetically mapped already which is why we were able to come up with viable vaccine candidates so quickly in stage 1, then stage 2 scientifically took the correct amount of time what we did to speed it up was just build the manufacturing platform at the same time we were running the experiments so if it worked we could produce faster. And stage 3 has now had many months and millions of people immunized and we are seeing the immunity hold for over 9 months now with sign of it dissipating and no significant numbers of adverse reactions.  During every flu season we see 2 anaphylactic reactions for every million shots given and we initially thought we would see 11 per million with the COVID vaccine but now we see only 2-3 with Moderna and 5 or so with [place] so compared to someone is dying every 20 minutes from [place] and more deadly and infectious strains are coming it is definitely best when weighing the risks and benefits to take the shots.  Another pooled analysis of the 5 most utilized vaccines in the world shows that after full immunization so far no one has died from [place].   Preventive Care 61-51 Years Old, Female Preventive care refers to visits with your health care provider and lifestyle choices that can promote health and wellness. This includes:  A yearly physical exam. This may also be called an annual well check.  Regular dental visits and eye  exams.  Immunizations.  Screening for certain conditions.  Healthy lifestyle choices, such as eating a healthy diet, getting regular exercise, not using drugs or products that contain nicotine and tobacco, and limiting alcohol use. What can I expect for my preventive care visit? Physical exam Your health care provider will check your:  Height and weight. This may be used to calculate body mass index (BMI), which tells if you are at a healthy weight.  Heart rate and blood pressure.  Skin for abnormal spots. Counseling Your health care provider may ask you questions about your:  Alcohol, tobacco, and drug use.  Emotional well-being.  Home and relationship well-being.  Sexual activity.  Eating habits.  Work and work Statistician.  Method of birth control.  Menstrual cycle.  Pregnancy history. What immunizations do I need?  Influenza (flu) vaccine  This is recommended every year. Tetanus, diphtheria, and pertussis (Tdap) vaccine  You may need a Td booster every 10 years. Varicella (chickenpox) vaccine  You may need this if you have not been vaccinated. Zoster (shingles) vaccine  You may need this after age 77. Measles, mumps, and rubella (MMR) vaccine  You may need at least one dose of MMR if you were born in 1957 or later. You may also need a second dose. Pneumococcal conjugate (PCV13) vaccine  You may need this if you have certain conditions and were not previously vaccinated. Pneumococcal polysaccharide (PPSV23) vaccine  You may need one or two doses if you smoke cigarettes or if you have certain conditions. Meningococcal conjugate (MenACWY) vaccine  You  may need this if you have certain conditions. Hepatitis A vaccine  You may need this if you have certain conditions or if you travel or work in places where you may be exposed to hepatitis A. Hepatitis B vaccine  You may need this if you have certain conditions or if you travel or work in places where  you may be exposed to hepatitis B. Haemophilus influenzae type b (Hib) vaccine  You may need this if you have certain conditions. Human papillomavirus (HPV) vaccine  If recommended by your health care provider, you may need three doses over 6 months. You may receive vaccines as individual doses or as more than one vaccine together in one shot (combination vaccines). Talk with your health care provider about the risks and benefits of combination vaccines. What tests do I need? Blood tests  Lipid and cholesterol levels. These may be checked every 5 years, or more frequently if you are over 58 years old.  Hepatitis C test.  Hepatitis B test. Screening  Lung cancer screening. You may have this screening every year starting at age 36 if you have a 30-pack-year history of smoking and currently smoke or have quit within the past 15 years.  Colorectal cancer screening. All adults should have this screening starting at age 34 and continuing until age 32. Your health care provider may recommend screening at age 7 if you are at increased risk. You will have tests every 1-10 years, depending on your results and the type of screening test.  Diabetes screening. This is done by checking your blood sugar (glucose) after you have not eaten for a while (fasting). You may have this done every 1-3 years.  Mammogram. This may be done every 1-2 years. Talk with your health care provider about when you should start having regular mammograms. This may depend on whether you have a family history of breast cancer.  BRCA-related cancer screening. This may be done if you have a family history of breast, ovarian, tubal, or peritoneal cancers.  Pelvic exam and Pap test. This may be done every 3 years starting at age 8. Starting at age 31, this may be done every 5 years if you have a Pap test in combination with an HPV test. Other tests  Sexually transmitted disease (STD) testing.  Bone density scan. This is  done to screen for osteoporosis. You may have this scan if you are at high risk for osteoporosis. Follow these instructions at home: Eating and drinking  Eat a diet that includes fresh fruits and vegetables, whole grains, lean protein, and low-fat dairy.  Take vitamin and mineral supplements as recommended by your health care provider.  Do not drink alcohol if: ? Your health care provider tells you not to drink. ? You are pregnant, may be pregnant, or are planning to become pregnant.  If you drink alcohol: ? Limit how much you have to 0-1 drink a day. ? Be aware of how much alcohol is in your drink. In the [place] one drink equals one 12 oz bottle of beer (355 mL), one 5 oz glass of wine (148 mL), or one 1 oz glass of hard liquor (44 mL). Lifestyle  Take daily care of your teeth and gums.  Stay active. Exercise for at least 30 minutes on 5 or more days each week.  Do not use any products that contain nicotine or tobacco, such as cigarettes, e-cigarettes, and chewing tobacco. If you need help quitting, ask your health care provider.  If you are sexually active, practice safe sex. Use a condom or other form of birth control (contraception) in order to prevent pregnancy and STIs (sexually transmitted infections).  If told by your health care provider, take low-dose aspirin daily starting at age 109. What's next?  Visit your health care provider once a year for a well check visit.  Ask your health care provider how often you should have your eyes and teeth checked.  Stay up to date on all vaccines. This information is not intended to replace advice given to you by your health care provider. Make sure you discuss any questions you have with your health care provider. Document Revised: 08/15/2018 Document Reviewed: 08/15/2018 Elsevier Patient Education  [place].  Melatonin 2-5 mg at bedtime  [email or web address]

## 2020-03-19 LAB — COMPREHENSIVE METABOLIC PANEL
AG Ratio: 1.3 (calc) (ref 1.0–2.5)
ALT: 18 U/L (ref 6–29)
AST: 17 U/L (ref 10–35)
Albumin: 4.1 g/dL (ref 3.6–5.1)
Alkaline phosphatase (APISO): 49 U/L (ref 31–125)
BUN: 15 mg/dL (ref 7–25)
CO2: 21 mmol/L (ref 20–32)
Calcium: 9.5 mg/dL (ref 8.6–10.2)
Chloride: 104 mmol/L (ref 98–110)
Creat: 0.7 mg/dL (ref 0.50–1.10)
Globulin: 3.2 g/dL (calc) (ref 1.9–3.7)
Glucose, Bld: 86 mg/dL (ref 65–99)
Potassium: 4.2 mmol/L (ref 3.5–5.3)
Sodium: 139 mmol/L (ref 135–146)
Total Bilirubin: 0.3 mg/dL (ref 0.2–1.2)
Total Protein: 7.3 g/dL (ref 6.1–8.1)

## 2020-03-19 LAB — HEMOGLOBIN A1C
Hgb A1c MFr Bld: 5.3 % of total Hgb (ref <5.7)
Mean Plasma Glucose: 105 (calc)
eAG (mmol/L): 5.8 (calc)

## 2020-03-19 LAB — CBC
HCT: 45.3 % — ABNORMAL HIGH (ref 35.0–45.0)
Hemoglobin: 15.2 g/dL (ref 11.7–15.5)
MCH: 33.3 pg — ABNORMAL HIGH (ref 27.0–33.0)
MCHC: 33.6 g/dL (ref 32.0–36.0)
MCV: 99.1 fL (ref 80.0–100.0)
MPV: 9.4 fL (ref 7.5–12.5)
Platelets: 304 10*3/uL (ref 140–400)
RBC: 4.57 10*6/uL (ref 3.80–5.10)
RDW: 11.2 % (ref 11.0–15.0)
WBC: 13 10*3/uL — ABNORMAL HIGH (ref 3.8–10.8)

## 2020-03-19 LAB — LIPID PANEL
Cholesterol: 176 mg/dL (ref <200)
HDL: 39 mg/dL — ABNORMAL LOW (ref >=50)
LDL Cholesterol (Calc): 108 mg/dL (calc) — ABNORMAL HIGH
Non-HDL Cholesterol (Calc): 137 mg/dL (calc) — ABNORMAL HIGH (ref <130)
Total CHOL/HDL Ratio: 4.5 (calc) (ref <5.0)
Triglycerides: 169 mg/dL — ABNORMAL HIGH (ref <150)

## 2020-03-19 LAB — TSH: TSH: 3.71 mIU/L

## 2020-03-22 ENCOUNTER — Telehealth: Payer: Self-pay | Admitting: Family Medicine

## 2020-03-22 ENCOUNTER — Encounter: Payer: Self-pay | Admitting: Family Medicine

## 2020-03-22 ENCOUNTER — Encounter: Payer: Self-pay | Admitting: *Deleted

## 2020-03-22 ENCOUNTER — Other Ambulatory Visit: Payer: Self-pay | Admitting: *Deleted

## 2020-03-22 DIAGNOSIS — E782 Mixed hyperlipidemia: Secondary | ICD-10-CM

## 2020-03-22 MED ORDER — MONTELUKAST SODIUM 10 MG PO TABS: 10.0000 mg | ORAL_TABLET | Freq: Every day | ORAL | 1 refills | Status: DC

## 2020-03-22 MED ORDER — ESCITALOPRAM OXALATE 10 MG PO TABS: 10.0000 mg | ORAL_TABLET | Freq: Every day | ORAL | 1 refills | Status: DC

## 2020-03-22 NOTE — Assessment & Plan Note (Signed)
Encouraged moist heat and gentle stretching as tolerated. May try NSAIDs and prescription meds as directed and report if symptoms worsen or seek immediate care 

## 2020-03-22 NOTE — Progress Notes (Signed)
Subjective:    Patient ID: Alicia Cox, female    DOB: 01-Aug-1971, 49 y.o.   MRN: [id number]  Chief Complaint  Patient presents with  . Annual Exam    doing well , minor complaints    HPI Patient is in today for annual preventative exam and follow upon chronic medical concerns. She is doing well. No recent febrile illness or hospitalizations. She has been struggling with some back pain but no recent falll or injury. Mostly right sided. No incontinence or radicular symptoms. Denies CP/palp/SOB/HA/congestion/fevers/GI or GU c/o. Taking meds as prescribed  Past Medical History:  Diagnosis Date  . Acute bronchitis 11/03/2013  . Allergy    hay fever  . Arthritis   . Blood in stool   . Cervical cancer screening 09/14/2016   Menarche at 13 Regular and heavy flow no history of abnormal pap in past G2P2, s/p 2 svd No history of abnormal MGM No concerns today  gyn surgeries: tubal and endometrial ablation LMP 09/13/2016  . Chest pain 08/24/2013  . Chicken pox as a child  . Dermatitis 03/02/2016  . Frequent headaches   . GERD (gastroesophageal reflux disease)   . Heart murmur   . Hypothyroidism    Hashimoto's  . Knee pain, left 01/09/2014  . Low back pain 08/26/2017  . Migraine   . Obesity 11/15/2014  . Preventative health care 09/12/2015  . TOS (thoracic outlet syndrome) 08/25/2013  . UTI (urinary tract infection)   . Vitamin D deficiency     Past Surgical History:  Procedure Laterality Date  . APPENDECTOMY    . CESAREAN SECTION     twice  . CHOLECYSTECTOMY    . ENDOMETRIAL ABLATION    . TONSILLECTOMY    . TUBAL LIGATION  1999    Family History  Problem Relation Age of Onset  . Hyperlipidemia Mother   . Hypertension Mother   . Skin cancer Mother   . Diabetes Father        type 2  . Hypertension Sister   . Diabetes Sister   . Asthma Daughter   . Heart disease Maternal Grandmother   . Diabetes Maternal Grandmother   . Heart attack Maternal Grandfather   . Diabetes  Maternal Grandfather   . Diabetes Paternal Grandmother   . Diabetes Paternal Grandfather     Social History   Socioeconomic History  . Marital status: Married    Spouse name: Not on file  . Number of children: Not on file  . Years of education: Not on file  . Highest education level: Not on file  Occupational History  . Not on file  Tobacco Use  . Smoking status: Never Smoker  . Smokeless tobacco: Never Used  . Tobacco comment: NEVER USED TOBACCO  Substance and Sexual Activity  . Alcohol use: No    Alcohol/week: 0.0 standard drinks  . Drug use: No  . 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 Not on file  Social History Narrative  . Not on file   Social Determinants of Health   Financial Resource Strain:   . Difficulty of Paying Living Expenses:   Food Insecurity:   . Worried About Programme researcher, broadcasting/film/video in the Last Year:   . Barista in the Last Year:   Transportation Needs:   . Freight forwarder (Medical):   Marland Kitchen  Lack of Transportation (Non-Medical):   Physical Activity:   . Days of Exercise per Week:   . Minutes of Exercise per Session:   Stress:   . Feeling of Stress :   Social Connections:   . Frequency of Communication with Friends and Family:   . Frequency of Social Gatherings with Friends and Family:   . Attends Religious Services:   . Active Member of Clubs or Organizations:   . Attends Banker Meetings:   Marland Kitchen Marital Status:   Intimate Partner Violence:   . Fear of Current or Ex-Partner:   . Emotionally Abused:   Marland Kitchen Physically Abused:   . Sexually Abused:     Outpatient Medications Prior to Visit  Medication Sig Dispense Refill  . albuterol (VENTOLIN HFA) 108 (90 Base) MCG/ACT inhaler Inhale 2 puffs into the lungs every 6 (six) hours as needed for wheezing or shortness of breath. 18 g 5  . dicyclomine (BENTYL) 10 MG capsule  Take 1 capsule (10 mg total) by mouth 3 (three) times daily as needed for spasms. (Patient not taking: Reported on 03/18/2020) 90 capsule 0  . doxycycline (VIBRA-TABS) 100 MG tablet Take 1 tablet (100 mg total) by mouth 2 (two) times daily. (Patient not taking: Reported on 03/18/2020) 20 tablet 0  . famotidine (PEPCID) 40 MG tablet Take 1 tablet (40 mg total) by mouth at bedtime. 30 tablet 1  . gabapentin (NEURONTIN) 300 MG capsule nightly 90 capsule 3  . HYDROMET 5-1.5 MG/5ML syrup     . levothyroxine (SYNTHROID, LEVOTHROID) 100 MCG tablet TAKE ONE TABLET BY MOUTH ONCE DAILY EXCEPT TAKE ONE AND ONE-HALF TABLETS ON THURSDAYS AND SUNDAYS 105 tablet 0  . methylPREDNISolone (MEDROL) 4 MG tablet 5 tab po qd X 1d then 4 tab po qd X 1d then 3 tab po qd X 1d then 2 tab po qd then 1 tab po qd 15 tablet 0  . predniSONE (STERAPRED UNI-PAK 21 TAB) 10 MG (21) TBPK tablet     . tiZANidine (ZANAFLEX) 4 MG tablet Take 0.5-1 tablets (2-4 mg total) by mouth every 8 (eight) hours as needed for muscle spasms. 40 tablet 2  . escitalopram (LEXAPRO) 10 MG tablet TAKE 1 TABLET BY MOUTH ONCE DAILY 90 tablet 1  . montelukast (SINGULAIR) 10 MG tablet TAKE 1 TABLET (10 MG TOTAL) BY MOUTH AT BEDTIME. 90 tablet 1   No facility-administered medications prior to visit.    No Known Allergies  Review of Systems  Constitutional: Negative for chills, fever and malaise/fatigue.  HENT: Negative for congestion and hearing loss.   Eyes: Negative for discharge.  Respiratory: Negative for cough, sputum production and shortness of breath.   Cardiovascular: Negative for chest pain, palpitations and leg swelling.  Gastrointestinal: Negative for abdominal pain, blood in stool, constipation, diarrhea, heartburn, nausea and vomiting.  Genitourinary: Negative for dysuria, frequency, hematuria and urgency.  Musculoskeletal: Positive for back pain. Negative for falls and myalgias.  Skin: Negative for rash.  Neurological: Negative for  dizziness, sensory change, loss of consciousness, weakness and headaches.  Endo/Heme/Allergies: Negative for environmental allergies. Does not bruise/bleed easily.  Psychiatric/Behavioral: Negative for depression and suicidal ideas. The patient is not nervous/anxious and does not have insomnia.        Objective:    Physical Exam Constitutional:      General: She is not in acute distress.    Appearance: She is not diaphoretic.  HENT:     Head: Normocephalic and atraumatic.  Right Ear: External ear normal.     Left Ear: External ear normal.     Nose: Nose normal.     Mouth/Throat:     Pharynx: No oropharyngeal exudate.  Eyes:     General: No scleral icterus.       Right eye: No discharge.        Left eye: No discharge.     Conjunctiva/sclera: Conjunctivae normal.     Pupils: Pupils are equal, round, and reactive to light.  Neck:     Thyroid: No thyromegaly.  Cardiovascular:     Rate and Rhythm: Normal rate and regular rhythm.     Heart sounds: Normal heart sounds. No murmur.  Pulmonary:     Effort: Pulmonary effort is normal. No respiratory distress.     Breath sounds: Normal breath sounds. No wheezing or rales.  Abdominal:     General: Bowel sounds are normal. There is no distension.     Palpations: Abdomen is soft. There is no mass.     Tenderness: There is no abdominal tenderness.  Musculoskeletal:        General: No tenderness. Normal range of motion.     Cervical back: Normal range of motion and neck supple.  Lymphadenopathy:     Cervical: No cervical adenopathy.  Skin:    General: Skin is warm and dry.     Findings: No rash.  Neurological:     Mental Status: She is alert and oriented to person, place, and time.     Cranial Nerves: No cranial nerve deficit.     Coordination: Coordination normal.     Deep Tendon Reflexes: Reflexes are normal and symmetric. Reflexes normal.     BP 121/89 (BP Location: Right Arm, Patient Position: Sitting, Cuff Size: Large)    Pulse 75   Temp (!) 97.1 F (36.2 C) (Temporal)   Resp 16   Ht 5\' 4"  (1.626 m)   Wt 250 lb 9.6 oz (113.7 kg)   SpO2 98%   BMI 43.02 kg/m  Wt Readings from Last 3 Encounters:  03/18/20 250 lb 9.6 oz (113.7 kg)  05/28/19 257 lb (116.6 kg)  09/24/18 257 lb (116.6 kg)    Diabetic Foot Exam - Simple   No data filed     Lab Results  Component Value Date   WBC 13.0 (H) 03/18/2020   HGB 15.2 03/18/2020   HCT 45.3 (H) 03/18/2020   PLT 304 03/18/2020   GLUCOSE 86 03/18/2020   CHOL 176 03/18/2020   TRIG 169 (H) 03/18/2020   HDL 39 (L) 03/18/2020   LDLDIRECT 104.0 09/17/2017   LDLCALC 108 (H) 03/18/2020   ALT 18 03/18/2020   AST 17 03/18/2020   NA 139 03/18/2020   K 4.2 03/18/2020   CL 104 03/18/2020   CREATININE 0.70 03/18/2020   BUN 15 03/18/2020   CO2 21 03/18/2020   TSH 3.71 03/18/2020   HGBA1C 5.3 03/18/2020    Lab Results  Component Value Date   TSH 3.71 03/18/2020   Lab Results  Component Value Date   WBC 13.0 (H) 03/18/2020   HGB 15.2 03/18/2020   HCT 45.3 (H) 03/18/2020   MCV 99.1 03/18/2020   PLT 304 03/18/2020   Lab Results  Component Value Date   NA 139 03/18/2020   K 4.2 03/18/2020   CO2 21 03/18/2020   GLUCOSE 86 03/18/2020   BUN 15 03/18/2020   CREATININE 0.70 03/18/2020   BILITOT 0.3 03/18/2020   ALKPHOS 50 09/24/2018  AST 17 03/18/2020   ALT 18 03/18/2020   PROT 7.3 03/18/2020   ALBUMIN 4.2 09/24/2018   CALCIUM 9.5 03/18/2020   ANIONGAP 8 05/24/2018   GFR 100.46 09/24/2018   Lab Results  Component Value Date   CHOL 176 03/18/2020   Lab Results  Component Value Date   HDL 39 (L) 03/18/2020   Lab Results  Component Value Date   LDLCALC 108 (H) 03/18/2020   Lab Results  Component Value Date   TRIG 169 (H) 03/18/2020   Lab Results  Component Value Date   CHOLHDL 4.5 03/18/2020   Lab Results  Component Value Date   HGBA1C 5.3 03/18/2020       Assessment & Plan:   Problem List Items Addressed This Visit    GERD  (gastroesophageal reflux disease)   Relevant Orders   CBC (Completed)   Hypothyroidism    On Levothyroxine, continue to monitor      Relevant Orders   TSH (Completed)   Hyperglycemia    hgba1c acceptable, minimize simple carbs. Increase exercise as tolerated.       Relevant Orders   Comprehensive metabolic panel (Completed)   Hemoglobin A1c (Completed)   Hyperlipidemia, mixed    Encouraged heart healthy diet, increase exercise, avoid trans fats, consider a krill oil cap daily      Relevant Orders   Lipid panel (Completed)   Preventative health care    Patient encouraged to maintain heart healthy diet, regular exercise, adequate sleep. Consider daily probiotics. Take medications as prescribed. Labs ordered and reviewed. MGM ordered. No h/o abnormal paps will proceed with pap at next CPE      Leukocytosis    Persistent will refer to hematology for further evaluaiton       Other Visit Diagnoses    Encounter for screening for malignant neoplasm of breast, unspecified screening modality    -  Primary   Relevant Orders   MM 3D SCREEN BREAST BILATERAL      I am having Alicia Cox maintain her gabapentin, tiZANidine, levothyroxine, dicyclomine, albuterol, Hydromet, predniSONE, methylPREDNISolone, doxycycline, and famotidine.  No orders of the defined types were placed in this encounter.    [name], MD

## 2020-03-22 NOTE — Assessment & Plan Note (Signed)
Encouraged heart healthy diet, increase exercise, avoid trans fats, consider a krill oil cap daily 

## 2020-03-22 NOTE — Telephone Encounter (Signed)
Medication: escitalopram (LEXAPRO) 10 MG tablet  montelukast (SINGULAIR) 10 MG   Has the patient contacted their pharmacy? Yes.   (If no, request that the patient contact the pharmacy for the refill.) (If yes, when and what did the pharmacy advise?)  Preferred Pharmacy (with phone number or street name): [facility] - [place], [place] - [address] Phone:  [phone]  Fax:  [phone]      Agent: Please be advised that RX refills may take up to 3 business days. We ask that you follow-up with your pharmacy.

## 2020-03-22 NOTE — Telephone Encounter (Signed)
Rxs have been sent in.  See mychart.

## 2020-03-22 NOTE — Assessment & Plan Note (Signed)
Persistent will refer to hematology for further evaluaiton

## 2020-03-22 NOTE — Assessment & Plan Note (Signed)
On Levothyroxine, continue to monitor 

## 2020-03-22 NOTE — Assessment & Plan Note (Addendum)
Patient encouraged to maintain heart healthy diet, regular exercise, adequate sleep. Consider daily probiotics. Take medications as prescribed. Labs ordered and reviewed. MGM ordered. No h/o abnormal paps will proceed with pap at next CPE

## 2020-03-22 NOTE — Assessment & Plan Note (Signed)
hgba1c acceptable, minimize simple carbs. Increase exercise as tolerated.  

## 2020-03-24 ENCOUNTER — Other Ambulatory Visit: Payer: Self-pay | Admitting: Family Medicine

## 2020-03-24 DIAGNOSIS — E039 Hypothyroidism, unspecified: Secondary | ICD-10-CM

## 2020-03-31 ENCOUNTER — Inpatient Hospital Stay: Payer: 59 | Admitting: Family

## 2020-03-31 ENCOUNTER — Inpatient Hospital Stay: Payer: 59

## 2020-04-07 ENCOUNTER — Telehealth: Payer: Self-pay | Admitting: Family

## 2020-04-07 ENCOUNTER — Other Ambulatory Visit: Payer: Self-pay | Admitting: *Deleted

## 2020-04-07 ENCOUNTER — Inpatient Hospital Stay: Payer: 59 | Admitting: Family

## 2020-04-07 ENCOUNTER — Inpatient Hospital Stay (HOSPITAL_BASED_OUTPATIENT_CLINIC_OR_DEPARTMENT_OTHER): Payer: 59 | Admitting: Family

## 2020-04-07 ENCOUNTER — Encounter: Payer: Self-pay | Admitting: Family

## 2020-04-07 ENCOUNTER — Inpatient Hospital Stay: Payer: 59 | Attending: Family

## 2020-04-07 ENCOUNTER — Other Ambulatory Visit: Payer: Self-pay

## 2020-04-07 ENCOUNTER — Encounter: Payer: Self-pay | Admitting: *Deleted

## 2020-04-07 ENCOUNTER — Inpatient Hospital Stay: Payer: 59

## 2020-04-07 VITALS — BP 134/72 | HR 87 | Temp 97.1°F | Resp 18 | Ht 64.0 in | Wt 245.5 lb

## 2020-04-07 DIAGNOSIS — D72829 Elevated white blood cell count, unspecified: Secondary | ICD-10-CM

## 2020-04-07 DIAGNOSIS — I82401 Acute embolism and thrombosis of unspecified deep veins of right lower extremity: Secondary | ICD-10-CM

## 2020-04-07 DIAGNOSIS — Z7901 Long term (current) use of anticoagulants: Secondary | ICD-10-CM | POA: Insufficient documentation

## 2020-04-07 DIAGNOSIS — I82441 Acute embolism and thrombosis of right tibial vein: Secondary | ICD-10-CM | POA: Diagnosis present

## 2020-04-07 LAB — CBC WITH DIFFERENTIAL (CANCER CENTER ONLY)
Abs Immature Granulocytes: 0.05 10*3/uL (ref 0.00–0.07)
Basophils Absolute: 0.1 10*3/uL (ref 0.0–0.1)
Basophils Relative: 1 %
Eosinophils Absolute: 0.3 10*3/uL (ref 0.0–0.5)
Eosinophils Relative: 3 %
HCT: 45 % (ref 36.0–46.0)
Hemoglobin: 14.9 g/dL (ref 12.0–15.0)
Immature Granulocytes: 0 %
Lymphocytes Relative: 21 %
Lymphs Abs: 2.7 10*3/uL (ref 0.7–4.0)
MCH: 32.5 pg (ref 26.0–34.0)
MCHC: 33.1 g/dL (ref 30.0–36.0)
MCV: 98.3 fL (ref 80.0–100.0)
Monocytes Absolute: 0.7 10*3/uL (ref 0.1–1.0)
Monocytes Relative: 6 %
Neutro Abs: 8.8 10*3/uL — ABNORMAL HIGH (ref 1.7–7.7)
Neutrophils Relative %: 69 %
Platelet Count: 330 10*3/uL (ref 150–400)
RBC: 4.58 MIL/uL (ref 3.87–5.11)
RDW: 11.5 % (ref 11.5–15.5)
WBC Count: 12.8 10*3/uL — ABNORMAL HIGH (ref 4.0–10.5)
nRBC: 0 % (ref 0.0–0.2)

## 2020-04-07 LAB — CMP (CANCER CENTER ONLY)
ALT: 19 U/L (ref 0–44)
AST: 14 U/L — ABNORMAL LOW (ref 15–41)
Albumin: 4.3 g/dL (ref 3.5–5.0)
Alkaline Phosphatase: 54 U/L (ref 38–126)
Anion gap: 7 (ref 5–15)
BUN: 10 mg/dL (ref 6–20)
CO2: 29 mmol/L (ref 22–32)
Calcium: 9.6 mg/dL (ref 8.9–10.3)
Chloride: 102 mmol/L (ref 98–111)
Creatinine: 0.69 mg/dL (ref 0.44–1.00)
GFR, Est AFR Am: >60 mL/min (ref >60)
GFR, Estimated: >60 mL/min (ref >60)
Glucose, Bld: 105 mg/dL — ABNORMAL HIGH (ref 70–99)
Potassium: 3.9 mmol/L (ref 3.5–5.1)
Sodium: 138 mmol/L (ref 135–145)
Total Bilirubin: 0.3 mg/dL (ref 0.3–1.2)
Total Protein: 7.6 g/dL (ref 6.5–8.1)

## 2020-04-07 LAB — D-DIMER, QUANTITATIVE: D-Dimer, Quant: 0.35 ug/mL-FEU (ref 0.00–0.50)

## 2020-04-07 NOTE — Progress Notes (Signed)
Hematology and Oncology Follow Up Visit  KERIANA SARSFIELD [id number] August 05, 1971 49 y.o. 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 daily-to complete 6 months of therapy on 05/24/2018 Xarelto 10 mg p.o. daily-start on 05/24/2018  Current Therapy: Observation   Interim History:  Ms. Cundy is here today for follow-up. She is doing well and has no complaints at this time.  There was some concern with her history of elevated WBC count (for at least 13 years). Her differential is unremarkable. No anemia or thrombocytopenia.  No adenopathy noted.  She denies any issues with frequent infection. No fever, chills, n/v, cough, rash, dizziness, SOB, chest pain, palpitations, abdominal pain or changes in bowel or bladder habits.  No swelling, tenderness, numbness or tingling in her extremities at this time.  No falls or syncope.  No episodes of bleeding. No bruising or petechiae.  She has maintained a good appetite and is staying well hydrated. Her weight is stable.   ECOG Performance Status: 1 - Symptomatic but completely ambulatory  Medications:  Allergies as of 04/07/2020   No Known Allergies     Medication List       Accurate as of April 07, 2020  3:23 PM. If you have any questions, ask your nurse or doctor.        albuterol 108 (90 Base) MCG/ACT inhaler Commonly known as: Ventolin HFA Inhale 2 puffs into the lungs every 6 (six) hours as needed for wheezing or shortness of breath.   aspirin 81 MG EC tablet Take by mouth.   dicyclomine 10 MG capsule Commonly known as: Bentyl Take 1 capsule (10 mg total) by mouth 3 (three) times daily as needed for spasms.   doxycycline 100 MG tablet Commonly known as: VIBRA-TABS Take 1 tablet (100 mg total) by mouth 2  (two) times daily.   escitalopram 10 MG tablet Commonly known as: LEXAPRO Take 1 tablet (10 mg total) by mouth daily.   famotidine 40 MG tablet Commonly known as: PEPCID Take 1 tablet (40 mg total) by mouth at bedtime.   gabapentin 300 MG capsule Commonly known as: NEURONTIN nightly   HYDROcodone-acetaminophen 5-325 MG tablet Commonly known as: NORCO/VICODIN TAKE 1 TO 2 TABLETS BY MOUTH EVERY 6 HOURS AS NEEDED   Hydromet 5-1.5 MG/5ML syrup Generic drug: HYDROcodone-homatropine   levothyroxine 100 MCG tablet Commonly known as: SYNTHROID TAKE 1 TABLET BY MOUTH ONCE DAILY EXCEPT TAKE ONE AND ONE-HALF (1 & 1/2) TABLETS ON THURSDAYS AND SUNDAYS   methylPREDNISolone 4 MG tablet Commonly known as: Medrol 5 tab po qd X 1d then 4 tab po qd X 1d then 3 tab po qd X 1d then 2 tab po qd then 1 tab po qd   montelukast 10 MG tablet Commonly known as: SINGULAIR Take 1 tablet (10 mg total) by mouth at bedtime.   predniSONE 10 MG (21) Tbpk tablet Commonly known as: STERAPRED UNI-PAK 21 TAB   tiZANidine 4 MG tablet Commonly known as: Zanaflex Take 0.5-1 tablets (2-4 mg total) by mouth every 8 (eight) hours as needed for muscle spasms.   Virtussin A/C 100-10 MG/5ML syrup Generic drug: guaiFENesin-codeine Take 5 mLs by mouth 2 (two) times daily as needed.   Xarelto 10 MG Tabs tablet Generic drug: rivaroxaban  Allergies: No Known Allergies  Past Medical History, Surgical history, Social history, and Family History were reviewed and updated.  Review of Systems: All other 10 point review of systems is negative.   Physical Exam:  height is 5\' 4"  (1.626 m) and weight is 245 lb 8 oz (111.4 kg). Her temperature is 97.1 F (36.2 C) (abnormal). Her blood pressure is 134/72 and her pulse is 87. Her respiration is 18 and oxygen saturation is 100%.   Wt Readings from Last 3 Encounters:  04/07/20 245 lb 8 oz (111.4 kg)  03/18/20 250 lb 9.6 oz (113.7 kg)  05/28/19 257 lb (116.6 kg)      Ocular: Sclerae unicteric, pupils equal, round and reactive to light Ear-nose-throat: Oropharynx clear, dentition fair Lymphatic: No cervical or supraclavicular adenopathy Lungs no rales or rhonchi, good excursion bilaterally Heart regular rate and rhythm, no murmur appreciated Abd soft, nontender, positive bowel sounds, no liver or spleen tip palpated on exam, no fluid wave  MSK no focal spinal tenderness, no joint edema Neuro: non-focal, well-oriented, appropriate affect Breasts: Deferred   Lab Results  Component Value Date   WBC 12.8 (H) 04/07/2020   HGB 14.9 04/07/2020   HCT 45.0 04/07/2020   MCV 98.3 04/07/2020   PLT 330 04/07/2020   Lab Results  Component Value Date   IRON 147 (H) 02/06/2018   IRONPCTSAT 44.1 02/06/2018   Lab Results  Component Value Date   RBC 4.58 04/07/2020   No results found for: KPAFRELGTCHN, LAMBDASER, KAPLAMBRATIO No results found for: IGGSERUM, IGA, IGMSERUM No results found for: 04/09/2020, A1GS, A2GS, Dorene Ar, MSPIKE, SPEI   Chemistry      Component Value Date/Time   NA 138 04/07/2020 1428   K 3.9 04/07/2020 1428   CL 102 04/07/2020 1428   CO2 29 04/07/2020 1428   BUN 10 04/07/2020 1428   CREATININE 0.69 04/07/2020 1428   CREATININE 0.70 03/18/2020 1529      Component Value Date/Time   CALCIUM 9.6 04/07/2020 1428   ALKPHOS 54 04/07/2020 1428   AST 14 (L) 04/07/2020 1428   ALT 19 04/07/2020 1428   BILITOT 0.3 04/07/2020 1428       Impression and Plan: Ms. Muraski is a 49 yo caucasian female with history of original thrombus in 2016 in the left leg and recurrent thrombus in right leg in 2019. She completed treatment with Xarelto and so far there has been no evidence of new recurrence.  She has a long history (at least 13 years) of leukocytosis. I was able to go over her lab work in detail with Dr. 2020. Her differential was unremarkable and she remains asymptomatic.  At this time no intervention  is needed.  If she were to develop s/s such as enlarged lymph nodes, chronic fatigue and/or frequent infections she will let Myna Hidalgo know. She verbalized understanding and is in agreement.  She will continue to follow-up as needed.   [place], NP 4/21/20213:23 PM

## 2020-04-07 NOTE — Telephone Encounter (Signed)
:   Follows up PRN.   Per 4/21 los

## 2020-07-27 ENCOUNTER — Ambulatory Visit: Payer: 59 | Admitting: Family Medicine

## 2020-09-24 ENCOUNTER — Other Ambulatory Visit: Payer: Self-pay | Admitting: Family Medicine

## 2020-10-01 ENCOUNTER — Other Ambulatory Visit: Payer: Self-pay | Admitting: Family Medicine

## 2020-10-01 ENCOUNTER — Encounter: Payer: Self-pay | Admitting: Family Medicine

## 2020-10-01 DIAGNOSIS — E039 Hypothyroidism, unspecified: Secondary | ICD-10-CM

## 2020-10-01 MED ORDER — LEVOTHYROXINE SODIUM 100 MCG PO TABS: ORAL_TABLET | ORAL | 1 refills | Status: DC

## 2021-01-30 ENCOUNTER — Encounter: Payer: Self-pay | Admitting: Family Medicine

## 2021-01-31 MED ORDER — ALBUTEROL SULFATE HFA 108 (90 BASE) MCG/ACT IN AERS: 2.0000 | INHALATION_SPRAY | Freq: Four times a day (QID) | RESPIRATORY_TRACT | 5 refills | Status: DC | PRN

## 2021-02-05 ENCOUNTER — Encounter: Payer: Self-pay | Admitting: Family Medicine

## 2021-02-07 ENCOUNTER — Other Ambulatory Visit: Payer: Self-pay | Admitting: Family Medicine

## 2021-02-07 MED ORDER — CEFDINIR 300 MG PO CAPS: 300.0000 mg | ORAL_CAPSULE | Freq: Two times a day (BID) | ORAL | 0 refills | Status: AC

## 2021-03-23 ENCOUNTER — Other Ambulatory Visit: Payer: Self-pay

## 2021-03-24 ENCOUNTER — Encounter: Payer: 59 | Admitting: Family Medicine

## 2021-03-24 ENCOUNTER — Telehealth: Payer: Self-pay

## 2021-03-24 NOTE — Telephone Encounter (Signed)
Spoke with her husband and we are waiting on a date to reschedule

## 2021-03-24 NOTE — Telephone Encounter (Signed)
Caller states she missed a call from the office, regarding appointment.  Telephone: [phone]

## 2021-04-01 ENCOUNTER — Encounter: Payer: Self-pay | Admitting: Family Medicine

## 2021-04-01 DIAGNOSIS — E039 Hypothyroidism, unspecified: Secondary | ICD-10-CM

## 2021-04-04 ENCOUNTER — Other Ambulatory Visit: Payer: Self-pay | Admitting: Family Medicine

## 2021-04-04 MED ORDER — LEVOTHYROXINE SODIUM 100 MCG PO TABS: ORAL_TABLET | ORAL | 1 refills | Status: DC

## 2021-07-05 ENCOUNTER — Other Ambulatory Visit (HOSPITAL_COMMUNITY)
Admission: RE | Admit: 2021-07-05 | Discharge: 2021-07-05 | Disposition: A | Discharge status: Home / Self Care | Payer: 59 | Source: Ambulatory Visit | Attending: Family Medicine | Admitting: Family Medicine

## 2021-07-05 ENCOUNTER — Encounter: Payer: Self-pay | Admitting: Family Medicine

## 2021-07-05 ENCOUNTER — Ambulatory Visit (INDEPENDENT_AMBULATORY_CARE_PROVIDER_SITE_OTHER): Payer: 59 | Admitting: Family Medicine

## 2021-07-05 ENCOUNTER — Other Ambulatory Visit: Payer: Self-pay

## 2021-07-05 VITALS — BP 114/68 | HR 81 | Temp 98.4°F | Resp 16 | Wt 260.6 lb

## 2021-07-05 DIAGNOSIS — Z0000 Encounter for general adult medical examination without abnormal findings: Secondary | ICD-10-CM | POA: Diagnosis not present

## 2021-07-05 DIAGNOSIS — E039 Hypothyroidism, unspecified: Secondary | ICD-10-CM | POA: Diagnosis not present

## 2021-07-05 DIAGNOSIS — Z1231 Encounter for screening mammogram for malignant neoplasm of breast: Secondary | ICD-10-CM | POA: Diagnosis not present

## 2021-07-05 DIAGNOSIS — Z124 Encounter for screening for malignant neoplasm of cervix: Secondary | ICD-10-CM

## 2021-07-05 DIAGNOSIS — R5383 Other fatigue: Secondary | ICD-10-CM | POA: Diagnosis not present

## 2021-07-05 DIAGNOSIS — R519 Headache, unspecified: Secondary | ICD-10-CM

## 2021-07-05 DIAGNOSIS — E782 Mixed hyperlipidemia: Secondary | ICD-10-CM

## 2021-07-05 DIAGNOSIS — K219 Gastro-esophageal reflux disease without esophagitis: Secondary | ICD-10-CM

## 2021-07-05 DIAGNOSIS — M79622 Pain in left upper arm: Secondary | ICD-10-CM

## 2021-07-05 DIAGNOSIS — E66813 Obesity, class 3: Secondary | ICD-10-CM

## 2021-07-05 DIAGNOSIS — R739 Hyperglycemia, unspecified: Secondary | ICD-10-CM

## 2021-07-05 DIAGNOSIS — M545 Low back pain, unspecified: Secondary | ICD-10-CM

## 2021-07-05 DIAGNOSIS — T7840XS Allergy, unspecified, sequela: Secondary | ICD-10-CM

## 2021-07-05 DIAGNOSIS — M542 Cervicalgia: Secondary | ICD-10-CM

## 2021-07-05 DIAGNOSIS — R0683 Snoring: Secondary | ICD-10-CM

## 2021-07-05 MED ORDER — TIZANIDINE HCL 4 MG PO TABS: 2.0000 mg | ORAL_TABLET | Freq: Three times a day (TID) | ORAL | 2 refills | Status: DC | PRN

## 2021-07-05 NOTE — Assessment & Plan Note (Signed)
On Levothyroxine, continue to monitor 

## 2021-07-05 NOTE — Progress Notes (Signed)
Patient ID: Alicia Cox, female    DOB: [birth year]  Age: 50 y.o. MRN: [id number]    Subjective:  Subjective  HPI Alicia Cox presents for office visit today for comprehensive physical exam today and follow up on management of chronic concerns. She has no recent hospitalizations or recent ER visits to report. Denies CP/palp/SOB/congestion/fevers/GI or GU c/o. Taking meds as prescribed.  She endorses experiencing snoring as she wakes herself up from the snoring. She states that she takes her allergy medications, but still struggles with snoring. She states that she experiences morning HA's as well.  She reports that she had COVID-19 back in February and endorses getting the pfizer covid vaccine, but not the boosters. She reports getting her COVID-19 Vaccine from [facility] at [facility]. She states the reason why she got her colonoscopy early in 2015 was due to bleeding. She was recommended a repeat after reaching age 65 y/o and she endorses taking probiotics.  Since surgery, has always had right ear pain. Especially gets worse when around loud environments.   Review of Systems  Constitutional:  Negative for chills, fatigue and fever.  HENT:  Positive for ear pain (right). Negative for congestion, rhinorrhea, sinus pressure, sinus pain, sore throat and trouble swallowing.   Eyes:  Negative for pain.  Respiratory:  Negative for cough and shortness of breath.   Cardiovascular:  Negative for chest pain, palpitations and leg swelling.  Gastrointestinal:  Negative for abdominal pain, blood in stool, diarrhea, nausea and vomiting.  Genitourinary:  Negative for decreased urine volume, flank pain, frequency, vaginal bleeding and vaginal discharge.  Musculoskeletal:  Negative for back pain.  Neurological:  Positive for headaches.  Psychiatric/Behavioral:  Positive for sleep disturbance (secondary to snoring).    History Past Medical History:  Diagnosis Date   Acute bronchitis 11/03/2013    Allergy    hay fever   Arthritis    Blood in stool    Cervical cancer screening 09/14/2016   Menarche at 13 Regular and heavy flow no history of abnormal pap in past G2P2, s/p 2 svd No history of abnormal MGM No concerns today  gyn surgeries: tubal and endometrial ablation LMP 09/13/2016   Chest pain 08/24/2013   Chicken pox as a child   Dermatitis 03/02/2016   Frequent headaches    GERD (gastroesophageal reflux disease)    Heart murmur    Hypothyroidism    Hashimoto's   Knee pain, left 01/09/2014   Low back pain 08/26/2017   Migraine    Obesity 11/15/2014   Preventative health care 09/12/2015   TOS (thoracic outlet syndrome) 08/25/2013   UTI (urinary tract infection)    Vitamin D deficiency     She has a past surgical history that includes Tonsillectomy; Cholecystectomy; Appendectomy; Endometrial ablation; Tubal ligation (1999); and Cesarean section.   Her family history includes Asthma in her daughter; Cancer in her mother and sister; Diabetes in her father, maternal grandfather, maternal grandmother, paternal grandfather, paternal grandmother, and sister; Heart attack in her maternal grandfather; Heart disease in her maternal grandmother; Hyperlipidemia in her mother; Hypertension in her mother and sister; Skin cancer in her mother.She reports that she has never smoked. She has never used smokeless tobacco. She reports that she does not drink alcohol and does not use drugs.  Current Outpatient Medications on File Prior to Visit  Medication Sig Dispense Refill   albuterol (VENTOLIN HFA) 108 (90 Base) MCG/ACT inhaler Inhale 2 puffs into the lungs every 6 (six) hours as needed  for wheezing or shortness of breath. 18 g 5   aspirin 81 MG EC tablet Take by mouth.     dicyclomine (BENTYL) 10 MG capsule Take 1 capsule (10 mg total) by mouth 3 (three) times daily as needed for spasms. 90 capsule 0   escitalopram (LEXAPRO) 10 MG tablet Take 1 tablet (10 mg total) by mouth daily. 90 tablet 1    famotidine (PEPCID) 40 MG tablet Take 1 tablet (40 mg total) by mouth at bedtime. 30 tablet 1   gabapentin (NEURONTIN) 300 MG capsule nightly 90 capsule 3   levothyroxine (SYNTHROID) 100 MCG tablet TAKE 1 TABLET BY MOUTH ONCE DAILY EXCEPT TAKE ONE AND ONE-HALF (1 & 1/2) TABLETS ON THURSDAYS AND SUNDAYS 105 tablet 1   montelukast (SINGULAIR) 10 MG tablet Take 1 tablet (10 mg total) by mouth at bedtime. 90 tablet 3   No current facility-administered medications on file prior to visit.     Objective:  Objective  Physical Exam Constitutional:      General: She is not in acute distress.    Appearance: Normal appearance. She is not ill-appearing or toxic-appearing.  HENT:     Head: Normocephalic and atraumatic.     Right Ear: Tympanic membrane, ear canal and external ear normal.     Left Ear: Tympanic membrane, ear canal and external ear normal.     Nose: No congestion or rhinorrhea.  Eyes:     Extraocular Movements: Extraocular movements intact.     Right eye: No nystagmus.     Left eye: No nystagmus.     Pupils: Pupils are equal, round, and reactive to light.  Cardiovascular:     Rate and Rhythm: Normal rate and regular rhythm.     Pulses: Normal pulses.          Posterior tibial pulses are 2+ on the right side and 2+ on the left side.     Heart sounds: Normal heart sounds. No murmur heard. Pulmonary:     Effort: Pulmonary effort is normal. No respiratory distress.     Breath sounds: Normal breath sounds. No wheezing, rhonchi or rales.  Abdominal:     General: Bowel sounds are normal.     Palpations: Abdomen is soft. There is no mass.     Tenderness: no abdominal tenderness There is no guarding.     Hernia: No hernia is present.  Genitourinary:    General: Normal vulva.     Vagina: No vaginal discharge.     Rectum: Normal. Guaiac result negative.  Musculoskeletal:        General: Normal range of motion.     Cervical back: Normal range of motion and neck supple.  Skin:     General: Skin is warm and dry.  Neurological:     Mental Status: She is alert and oriented to person, place, and time.     Cranial Nerves: No facial asymmetry.     Motor: Motor function is intact. No weakness.  Psychiatric:        Behavior: Behavior normal.   BP 114/68   Pulse 81   Temp 98.4 F (36.9 C)   Resp 16   Wt 260 lb 9.6 oz (118.2 kg)   SpO2 97%   BMI 44.73 kg/m  Wt Readings from Last 3 Encounters:  07/05/21 260 lb 9.6 oz (118.2 kg)  04/07/20 245 lb 8 oz (111.4 kg)  03/18/20 250 lb 9.6 oz (113.7 kg)     Lab Results  Component Value  Date   WBC 10.8 (H) 07/05/2021   HGB 14.6 07/05/2021   HCT 42.8 07/05/2021   PLT 366.0 07/05/2021   GLUCOSE 98 07/05/2021   CHOL 164 07/05/2021   TRIG 165.0 (H) 07/05/2021   HDL 41.60 07/05/2021   LDLDIRECT 104.0 09/17/2017   LDLCALC 89 07/05/2021   ALT 18 07/05/2021   AST 15 07/05/2021   NA 138 07/05/2021   K 4.2 07/05/2021   CL 102 07/05/2021   CREATININE 0.73 07/05/2021   BUN 7 07/05/2021   CO2 28 07/05/2021   TSH 5.06 07/05/2021   HGBA1C 6.3 07/05/2021    VAS [place] UPPER EXTREMITY VENOUS DUPLEX  Result Date: 12/07/2018 UPPER VENOUS STUDY  Indications: Pain, Swelling, and Erythema Risk Factors: DVT Hx. Performing Technologist: Jeb Levering, I  Examination Guidelines: A complete evaluation includes B-mode imaging, spectral Doppler, color Doppler, and power Doppler as needed of all accessible portions of each vessel. Bilateral testing is considered an integral part of a complete examination. Limited examinations for reoccurring indications may be performed as noted.  Left Findings: +----------+------------+-----------------+---------+-----------+--------------+ LEFT      Compressible   Properties    PhasicitySpontaneous   Summary     +----------+------------+-----------------+---------+-----------+--------------+ IJV           Full                        Yes       Yes                    +----------+------------+-----------------+---------+-----------+--------------+ Subclavian    Full                        Yes       Yes                   +----------+------------+-----------------+---------+-----------+--------------+ Axillary                                                   Not visualized +----------+------------+-----------------+---------+-----------+--------------+ Brachial      Full                        Yes       Yes                   +----------+------------+-----------------+---------+-----------+--------------+ Radial        Full                                                        +----------+------------+-----------------+---------+-----------+--------------+ Ulnar         Full                                                        +----------+------------+-----------------+---------+-----------+--------------+ Cephalic      None        brightly  Acute                                echogenic                                       +----------+------------+-----------------+---------+-----------+--------------+ Basilic       Full                                                        +----------+------------+-----------------+---------+-----------+--------------+ Left cephalic thrombus antecubital fossa to wrist.  Summary:  Left: No evidence of deep vein thrombosis in the upper extremity. Findings consistent with acute superficial vein thrombosis involving the left cephalic vein.  *See table(s) above for measurements and observations.  Diagnosing physician: Sherald Hesshristopher Clark MD Electronically signed by Sherald Hesshristopher Clark MD on 12/07/2018 at 10:59:49 AM.    Final      Assessment & Plan:  Plan    Meds ordered this encounter  Medications   tiZANidine (ZANAFLEX) 4 MG tablet    Sig: Take 0.5-1 tablets (2-4 mg total) by mouth every 8 (eight) hours as needed for muscle spasms.    Dispense:  40 tablet     Refill:  2     Problem List Items Addressed This Visit     Frequent headaches   Relevant Medications   tiZANidine (ZANAFLEX) 4 MG tablet   Other Relevant Orders   CBC (Completed)   GERD (gastroesophageal reflux disease)   Allergy   Hypothyroidism    On Levothyroxine, continue to monitor       Relevant Orders   TSH (Completed)   T4, free (Completed)   T3, free (Completed)   Thyroid peroxidase antibody (Completed)   Hyperglycemia    hgba1c acceptable, minimize simple carbs. Increase exercise as tolerated.        Relevant Orders   Comprehensive metabolic panel (Completed)   Hemoglobin A1c (Completed)   Hyperlipidemia, mixed    Encourage heart healthy diet such as MIND or DASH diet, increase exercise, avoid trans fats, simple carbohydrates and processed foods, consider a krill or fish or flaxseed oil cap daily.        Relevant Orders   Lipid panel (Completed)   Obesity    Encouraged DASH or MIND diet, decrease po intake and increase exercise as tolerated. Needs 7-8 hours of sleep nightly. Avoid trans fats, eat small, frequent meals every 4-5 hours with lean proteins, complex carbs and healthy fats. Minimize simple carbs, high fat foods and processed foods. Consider Wegovy or Saxenda       Preventative health care    Patient encouraged to maintain heart healthy diet, regular exercise, adequate sleep. Consider daily probiotics. Take medications as prescribed. Labs ordered and reviewed. Pap completed today, MGM ordered. Last colonoscopy 2015 clear repeat in 2025       Cervical cancer screening    Pap today, no concerns on exam.        Relevant Orders   Cytology - PAP( [place])   Low back pain   Relevant Medications   tiZANidine (ZANAFLEX) 4 MG tablet   Neck pain   Relevant Medications   tiZANidine (ZANAFLEX) 4 MG tablet   Other Visit  Diagnoses     Encounter for screening mammogram for malignant neoplasm of breast    -  Primary   Relevant Orders   MM  3D SCREEN BREAST BILATERAL   Snoring       Fatigue, unspecified type       Relevant Orders   CBC (Completed)   Left upper arm pain       Relevant Medications   tiZANidine (ZANAFLEX) 4 MG tablet       Follow-up: Return in about 3 months (around 10/05/2021).  I, Billie Lade, acting as a scribe for [name], MD, have documented all relevent documentation on behalf of [name], MD, as directed by [name], MD while in the presence of [name], MD.  I, Bradd Canary, MD personally performed the services described in this documentation. All medical record entries made by the scribe were at my direction and in my presence. I have reviewed the chart and agree that the record reflects my personal performance and is accurate and complete

## 2021-07-05 NOTE — Assessment & Plan Note (Signed)
hgba1c acceptable, minimize simple carbs. Increase exercise as tolerated.  

## 2021-07-05 NOTE — Patient Instructions (Addendum)
Paxlovid or Molnupiravir is the new COVID medication we can give you if you get COVID so make sure you test if you have symptoms because we have to treat by day 5 of symptoms for it to be effective. If you are positive let us know so we can treat. If a home test is negative and your symptoms are persistent get a PCR test. Can check testing locations at [email or web address] If you are positive we will make an appointment with [place] and we will send in Paxlovid if you would like it. Check with your pharmacy before we meet to confirm they have it in stock, if they do not then we can get the prescription at the [facility] or [place] for weight loss  Preventive Care 100-49 Years Old, Female Preventive care refers to lifestyle choices and visits with your health care provider that can promote health and wellness. This includes: A yearly physical exam. This is also called an annual wellness visit. Regular dental and eye exams. Immunizations. Screening for certain conditions. Healthy lifestyle choices, such as: Eating a healthy diet. Getting regular exercise. Not using drugs or products that contain nicotine and tobacco. Limiting alcohol use. What can I expect for my preventive care visit? Physical exam Your health care provider will check your: Height and weight. These may be used to calculate your BMI (body mass index). BMI is a measurement that tells if you are at a healthy weight. Heart rate and blood pressure. Body temperature. Skin for abnormal spots. Counseling Your health care provider may ask you questions about your: Past medical problems. Family's medical history. Alcohol, tobacco, and drug use. Emotional well-being. Home life and relationship well-being. Sexual activity. Diet, exercise, and sleep habits. Work and work Statistician. Access to firearms. Method of birth control. Menstrual cycle. Pregnancy history. What immunizations do I need?  Vaccines are  usually given at various ages, according to a schedule. Your health care provider will recommend vaccines for you based on your age, medicalhistory, and lifestyle or other factors, such as travel or where you work. What tests do I need? Blood tests Lipid and cholesterol levels. These may be checked every 5 years, or more often if you are over 51 years old. Hepatitis C test. Hepatitis B test. Screening Lung cancer screening. You may have this screening every year starting at age 67 if you have a 30-pack-year history of smoking and currently smoke or have quit within the past 15 years. Colorectal cancer screening. All adults should have this screening starting at age 47 and continuing until age 40. Your health care provider may recommend screening at age 75 if you are at increased risk. You will have tests every 1-10 years, depending on your results and the type of screening test. Diabetes screening. This is done by checking your blood sugar (glucose) after you have not eaten for a while (fasting). You may have this done every 1-3 years. Mammogram. This may be done every 1-2 years. Talk with your health care provider about when you should start having regular mammograms. This may depend on whether you have a family history of breast cancer. BRCA-related cancer screening. This may be done if you have a family history of breast, ovarian, tubal, or peritoneal cancers. Pelvic exam and Pap test. This may be done every 3 years starting at age 62. Starting at age 68, this may be done every 5 years if you have a Pap test in combination with an HPV test. Other tests  STD (sexually transmitted disease) testing, if you are at risk. Bone density scan. This is done to screen for osteoporosis. You may have this scan if you are at high risk for osteoporosis. Talk with your health care provider about your test results, treatment options,and if necessary, the need for more tests. Follow these instructions at  home: Eating and drinking  Eat a diet that includes fresh fruits and vegetables, whole grains, lean protein, and low-fat dairy products. Take vitamin and mineral supplements as recommended by your health care provider. Do not drink alcohol if: Your health care provider tells you not to drink. You are pregnant, may be pregnant, or are planning to become pregnant. If you drink alcohol: Limit how much you have to 0-1 drink a day. Be aware of how much alcohol is in your drink. In the [place] one drink equals one 12 oz bottle of beer (355 mL), one 5 oz glass of wine (148 mL), or one 1 oz glass of hard liquor (44 mL).  Lifestyle Take daily care of your teeth and gums. Brush your teeth every morning and night with fluoride toothpaste. Floss one time each day. Stay active. Exercise for at least 30 minutes 5 or more days each week. Do not use any products that contain nicotine or tobacco, such as cigarettes, e-cigarettes, and chewing tobacco. If you need help quitting, ask your health care provider. Do not use drugs. If you are sexually active, practice safe sex. Use a condom or other form of protection to prevent STIs (sexually transmitted infections). If you do not wish to become pregnant, use a form of birth control. If you plan to become pregnant, see your health care provider for a prepregnancy visit. If told by your health care provider, take low-dose aspirin daily starting at age 39. Find healthy ways to cope with stress, such as: Meditation, yoga, or listening to music. Journaling. Talking to a trusted person. Spending time with friends and family. Safety Always wear your seat belt while driving or riding in a vehicle. Do not drive: If you have been drinking alcohol. Do not ride with someone who has been drinking. When you are tired or distracted. While texting. Wear a helmet and other protective equipment during sports activities. If you have firearms in your house, make sure you  follow all gun safety procedures. What's next? Visit your health care provider once a year for an annual wellness visit. Ask your health care provider how often you should have your eyes and teeth checked. Stay up to date on all vaccines. This information is not intended to replace advice given to you by your health care provider. Make sure you discuss any questions you have with your healthcare provider. Document Revised: 09/07/2020 Document Reviewed: 08/15/2018 Elsevier Patient Education  2022 [employer].

## 2021-07-05 NOTE — Assessment & Plan Note (Signed)
Encourage heart healthy diet such as MIND or DASH diet, increase exercise, avoid trans fats, simple carbohydrates and processed foods, consider a krill or fish or flaxseed oil cap daily.  °

## 2021-07-05 NOTE — Assessment & Plan Note (Signed)
Pap today, no concerns on exam.  

## 2021-07-06 LAB — CBC
HCT: 42.8 % (ref 36.0–46.0)
Hemoglobin: 14.6 g/dL (ref 12.0–15.0)
MCHC: 34.2 g/dL (ref 30.0–36.0)
MCV: 97.1 fl (ref 78.0–100.0)
Platelets: 366 10*3/uL (ref 150.0–400.0)
RBC: 4.41 Mil/uL (ref 3.87–5.11)
RDW: 12.4 % (ref 11.5–15.5)
WBC: 10.8 10*3/uL — ABNORMAL HIGH (ref 4.0–10.5)

## 2021-07-06 LAB — LIPID PANEL
Cholesterol: 164 mg/dL (ref 0–200)
HDL: 41.6 mg/dL (ref >39.00)
LDL Cholesterol: 89 mg/dL (ref 0–99)
NonHDL: 122.25
Total CHOL/HDL Ratio: 4
Triglycerides: 165 mg/dL — ABNORMAL HIGH (ref 0.0–149.0)
VLDL: 33 mg/dL (ref 0.0–40.0)

## 2021-07-06 LAB — COMPREHENSIVE METABOLIC PANEL
ALT: 18 U/L (ref 0–35)
AST: 15 U/L (ref 0–37)
Albumin: 4.3 g/dL (ref 3.5–5.2)
Alkaline Phosphatase: 58 U/L (ref 39–117)
BUN: 7 mg/dL (ref 6–23)
CO2: 28 mEq/L (ref 19–32)
Calcium: 9.6 mg/dL (ref 8.4–10.5)
Chloride: 102 mEq/L (ref 96–112)
Creatinine, Ser: 0.73 mg/dL (ref 0.40–1.20)
GFR: 96.62 mL/min (ref >60.00)
Glucose, Bld: 98 mg/dL (ref 70–99)
Potassium: 4.2 mEq/L (ref 3.5–5.1)
Sodium: 138 mEq/L (ref 135–145)
Total Bilirubin: 0.3 mg/dL (ref 0.2–1.2)
Total Protein: 7.5 g/dL (ref 6.0–8.3)

## 2021-07-06 LAB — T3, FREE: T3, Free: 3.2 pg/mL (ref 2.3–4.2)

## 2021-07-06 LAB — THYROID PEROXIDASE ANTIBODY: Thyroperoxidase Ab SerPl-aCnc: 558 IU/mL — ABNORMAL HIGH (ref <9)

## 2021-07-06 LAB — HEMOGLOBIN A1C: Hgb A1c MFr Bld: 6.3 % (ref 4.6–6.5)

## 2021-07-06 LAB — T4, FREE: Free T4: 0.83 ng/dL (ref 0.60–1.60)

## 2021-07-06 LAB — TSH: TSH: 5.06 u[IU]/mL (ref 0.35–5.50)

## 2021-07-06 NOTE — Assessment & Plan Note (Addendum)
Patient encouraged to maintain heart healthy diet, regular exercise, adequate sleep. Consider daily probiotics. Take medications as prescribed. Labs ordered and reviewed. Pap completed today, MGM ordered. Last colonoscopy 2015 clear repeat in 2025

## 2021-07-06 NOTE — Assessment & Plan Note (Signed)
Encouraged DASH or MIND diet, decrease po intake and increase exercise as tolerated. Needs 7-8 hours of sleep nightly. Avoid trans fats, eat small, frequent meals every 4-5 hours with lean proteins, complex carbs and healthy fats. Minimize simple carbs, high fat foods and processed foods. Consider Wegovy or Saxenda °

## 2021-07-08 LAB — CYTOLOGY - PAP
Adequacy: ABSENT
Comment: NEGATIVE
Diagnosis: NEGATIVE
High risk HPV: NEGATIVE

## 2021-07-20 ENCOUNTER — Encounter: Payer: Self-pay | Admitting: Family Medicine

## 2021-08-02 ENCOUNTER — Other Ambulatory Visit: Payer: Self-pay

## 2021-08-02 MED ORDER — BUPROPION HCL ER (XL) 150 MG PO TB24: 150.0000 mg | ORAL_TABLET | Freq: Every day | ORAL | 2 refills | Status: DC

## 2021-08-02 NOTE — Telephone Encounter (Signed)
Patient has an appointment in October and no openings for one month.  Would you be ok with her following up with a message on mychart?

## 2021-08-03 ENCOUNTER — Ambulatory Visit: Payer: Self-pay

## 2021-08-03 ENCOUNTER — Encounter: Payer: Self-pay | Admitting: Family Medicine

## 2021-08-03 ENCOUNTER — Ambulatory Visit (INDEPENDENT_AMBULATORY_CARE_PROVIDER_SITE_OTHER): Payer: 59 | Admitting: Family Medicine

## 2021-08-03 ENCOUNTER — Other Ambulatory Visit: Payer: Self-pay

## 2021-08-03 VITALS — BP 120/86 | HR 77 | Ht 64.0 in | Wt 264.0 lb

## 2021-08-03 DIAGNOSIS — M79622 Pain in left upper arm: Secondary | ICD-10-CM | POA: Diagnosis not present

## 2021-08-03 DIAGNOSIS — M25511 Pain in right shoulder: Secondary | ICD-10-CM | POA: Insufficient documentation

## 2021-08-03 DIAGNOSIS — M542 Cervicalgia: Secondary | ICD-10-CM | POA: Diagnosis not present

## 2021-08-03 MED ORDER — MELOXICAM 7.5 MG PO TABS: 7.5000 mg | ORAL_TABLET | Freq: Every day | ORAL | 1 refills | Status: DC

## 2021-08-03 MED ORDER — TIZANIDINE HCL 4 MG PO TABS: 2.0000 mg | ORAL_TABLET | Freq: Three times a day (TID) | ORAL | 2 refills | Status: DC | PRN

## 2021-08-03 MED ORDER — GABAPENTIN 300 MG PO CAPS: ORAL_CAPSULE | ORAL | 3 refills | Status: DC

## 2021-08-03 NOTE — Patient Instructions (Signed)
Good to see you  Meloxicam 7.5 daily for 10 days then as needed  Ice a couple times a day  See me again in 6-8 weeks

## 2021-08-03 NOTE — Assessment & Plan Note (Signed)
Acute on chronic.  Patient does have ultrasound findings I do have what appeared to be a tear previously of the supraspinatus but seems to be healing appropriately.  Mild subacromial bursitis noted.  Patient does have pain which is some mild range of motion but very minimal pain of the neck this time.  Discussed which activities to do which wants to avoid.  Increase activity slowly.  Work with Event organiser.  Meloxicam given for breakthrough pain.  Follow-up with me again in 6 weeks

## 2021-08-03 NOTE — Progress Notes (Signed)
[facility] [address] [place] [address] Phone: [phone] Subjective:   Alicia Cox, am serving as a scribe for Dr. Antoine Primas.  This visit occurred during the SARS-CoV-2 public health emergency.  Safety protocols were in place, including screening questions prior to the visit, additional usage of staff PPE, and extensive cleaning of exam room while observing appropriate contact time as indicated for disinfecting solutions.   I'm seeing this patient by the request  of:  Bradd Canary, MD  CC:   [id number]  Alicia Cox is a 50 y.o. female coming in with complaint of Right shoulder pain few months ago No MOI. When using the shoulder a lot the pain is worse. 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  Pain will radiate up the right side of neck. Patient interested in an injection.   Onset- several months ago  Location right shoulder  Duration-  Character- sharp shooting and aching  Aggravating factors- lifting arm up  Reliving factors-  Therapies tried-  tizanadine, advil, ice   Severity-     Past Medical History:  Diagnosis Date   Acute bronchitis 11/03/2013   Allergy    hay fever   Arthritis    Blood in stool    Cervical cancer screening 09/14/2016   Menarche at 13 Regular and heavy flow no history of abnormal pap in past G2P2, s/p 2 svd No history of abnormal MGM No concerns today  gyn surgeries: tubal and endometrial ablation LMP 09/13/2016   Chest pain 08/24/2013   Chicken pox as a child   Dermatitis 03/02/2016   Frequent headaches    GERD (gastroesophageal reflux disease)    Heart murmur    Hypothyroidism    Hashimoto's   Knee pain, left 01/09/2014   Low back pain 08/26/2017   Migraine    Obesity 11/15/2014   Preventative health care 09/12/2015   TOS (thoracic outlet syndrome) 08/25/2013   UTI (urinary tract infection)    Vitamin D deficiency    Past Surgical  History:  Procedure Laterality Date   APPENDECTOMY     CESAREAN SECTION     twice   CHOLECYSTECTOMY     ENDOMETRIAL ABLATION     TONSILLECTOMY     TUBAL LIGATION  1999   Social History   Socioeconomic History   Marital status: Married    Spouse name: Not on file   Number of children: Not on file   Years of education: Not on file   Highest education level: Not on file  Occupational History   Not on file  Tobacco Use   Smoking status: Never   Smokeless tobacco: Never   Tobacco comments:    NEVER USED TOBACCO  Vaping Use   Vaping Use: Never used  Substance and Sexual Activity   Alcohol use: No    Alcohol/week: 0.0 standard drinks   Drug use: No   Sexual activity: Yes    Partners: Male    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, no dietary restrictions, 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  Other Topics Concern   Not on file  Social History Narrative   Not on file   Social Determinants of Health   Financial Resource Strain: Not on file  Food Insecurity: Not on file  Transportation Needs: Not on file  Physical Activity: Not on file  Stress: Not on file  Social Connections: Not on file   No  Known Allergies Family History  Problem Relation Age of Onset   Cancer Mother        skin cancer   Hyperlipidemia Mother    Hypertension Mother    Skin cancer Mother    Diabetes Father        type 2   Cancer Sister        BCC   Hypertension Sister    Diabetes Sister    Asthma Daughter    Heart disease Maternal Grandmother    Diabetes Maternal Grandmother    Heart attack Maternal Grandfather    Diabetes Maternal Grandfather    Diabetes Paternal Grandmother    Diabetes Paternal Grandfather     Current Outpatient Medications (Endocrine & Metabolic):    levothyroxine (SYNTHROID) 100 MCG tablet, TAKE 1 TABLET BY MOUTH ONCE DAILY EXCEPT TAKE ONE AND ONE-HALF (1 & 1/2) TABLETS ON THURSDAYS AND SUNDAYS   Current Outpatient Medications (Respiratory):    albuterol (VENTOLIN HFA) 108 (90  Base) MCG/ACT inhaler, Inhale 2 puffs into the lungs every 6 (six) hours as needed for wheezing or shortness of breath.   montelukast (SINGULAIR) 10 MG tablet, Take 1 tablet (10 mg total) by mouth at bedtime.  Current Outpatient Medications (Analgesics):    aspirin 81 MG EC tablet, Take by mouth.   meloxicam (MOBIC) 7.5 MG tablet, Take 1 tablet (7.5 mg total) by mouth daily.   Current Outpatient Medications (Other):    buPROPion (WELLBUTRIN XL) 150 MG 24 hr tablet, Take 1 tablet (150 mg total) by mouth daily.   dicyclomine (BENTYL) 10 MG capsule, Take 1 capsule (10 mg total) by mouth 3 (three) times daily as needed for spasms.   escitalopram (LEXAPRO) 10 MG tablet, Take 1 tablet (10 mg total) by mouth daily.   famotidine (PEPCID) 40 MG tablet, Take 1 tablet (40 mg total) by mouth at bedtime.   gabapentin (NEURONTIN) 300 MG capsule, nightly   tiZANidine (ZANAFLEX) 4 MG tablet, Take 0.5-1 tablets (2-4 mg total) by mouth every 8 (eight) hours as needed for muscle spasms.   Reviewed prior external information including notes and imaging from  primary care provider As well as notes that were available from care everywhere and other healthcare systems.  Past medical history, social, surgical and family history all reviewed in electronic medical record.  No pertanent information unless stated regarding to the chief complaint.   Review of Systems:  No headache, visual changes, nausea, vomiting, diarrhea, constipation, dizziness, abdominal pain, skin rash, fevers, chills, night sweats, weight loss, swollen lymph nodes, body aches, joint swelling, chest pain, shortness of breath, mood changes. POSITIVE muscle aches  Objective  Blood pressure 120/86, pulse 77, height 5\' 4"  (1.626 m), weight 264 lb (119.7 kg), SpO2 98 %.   General: No apparent distress alert and oriented x3 mood and affect normal, dressed appropriately.  HEENT: Pupils equal, extraocular movements intact  Respiratory: Patient's  speak in full sentences and does not appear short of breath  Cardiovascular: No lower extremity edema, non tender, no erythema  Gait normal with good balance and coordination.  MSK: Right shoulder exam shows some mild diffuse tenderness noted.  Patient does have positive impingement with Neer and Hawkins.  Rotator cuff strength though does appear to be intact.  Good range of motion otherwise.  Limited muscular skeletal ultrasound was performed and interpreted by , M   Limited musculoskeletal ultrasound of patient's shoulder shows that patient does have a very mild subacromial bursitis noted on impingement view.  Patient does have some mild scarring noted of the supraspinatus but appears to be old at this time.  Mild arthritic changes of the acromioclavicular joint noted. 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

## 2021-09-16 ENCOUNTER — Encounter: Payer: Self-pay | Admitting: Family Medicine

## 2021-09-21 ENCOUNTER — Telehealth (HOSPITAL_BASED_OUTPATIENT_CLINIC_OR_DEPARTMENT_OTHER): Payer: Self-pay

## 2021-09-21 ENCOUNTER — Ambulatory Visit: Payer: 59 | Admitting: Family Medicine

## 2021-10-02 ENCOUNTER — Encounter: Payer: Self-pay | Admitting: Family Medicine

## 2021-10-03 ENCOUNTER — Other Ambulatory Visit: Payer: Self-pay | Admitting: Family Medicine

## 2021-10-03 MED ORDER — BUPROPION HCL ER (XL) 150 MG PO TB24: 150.0000 mg | ORAL_TABLET | Freq: Every day | ORAL | 2 refills | Status: DC

## 2021-10-06 ENCOUNTER — Ambulatory Visit: Payer: 59 | Admitting: Family Medicine

## 2021-10-10 ENCOUNTER — Other Ambulatory Visit: Payer: Self-pay | Admitting: Family Medicine

## 2021-10-10 DIAGNOSIS — E039 Hypothyroidism, unspecified: Secondary | ICD-10-CM

## 2021-10-31 ENCOUNTER — Other Ambulatory Visit: Payer: Self-pay

## 2021-11-14 ENCOUNTER — Other Ambulatory Visit: Payer: Self-pay | Admitting: Family Medicine

## 2021-11-14 ENCOUNTER — Encounter: Payer: Self-pay | Admitting: Family Medicine

## 2021-11-14 MED ORDER — BUPROPION HCL ER (XL) 150 MG PO TB24: 150.0000 mg | ORAL_TABLET | Freq: Two times a day (BID) | ORAL | 2 refills | Status: DC

## 2021-12-24 ENCOUNTER — Encounter: Payer: Self-pay | Admitting: Family Medicine

## 2021-12-26 ENCOUNTER — Other Ambulatory Visit: Payer: Self-pay | Admitting: Family Medicine

## 2021-12-26 ENCOUNTER — Other Ambulatory Visit: Payer: Self-pay

## 2021-12-26 DIAGNOSIS — M7550 Bursitis of unspecified shoulder: Secondary | ICD-10-CM

## 2021-12-30 NOTE — Progress Notes (Signed)
[facility] [address] [place] [address] Phone: [phone] Subjective:   INadine Counts, am serving as a scribe for Dr. Antoine Primas. This visit occurred during the SARS-CoV-2 public health emergency.  Safety protocols were in place, including screening questions prior to the visit, additional usage of staff PPE, and extensive cleaning of exam room while observing appropriate contact time as indicated for disinfecting solutions.   I'm seeing this patient by the request  of:  Bradd Canary, MD  CC: Right shoulder and arm pain  [id number]  08/03/2021 Acute on chronic.  Patient does have ultrasound findings I do have what appeared to be a tear previously of the supraspinatus but seems to be healing appropriately.  Mild subacromial bursitis noted.  Patient does have pain which is some mild range of motion but very minimal pain of the neck this time.  Discussed which activities to do which wants to avoid.  Increase activity slowly.  Work with Event organiser.  Meloxicam given for breakthrough pain.  Follow-up with me again in 6 weeks  Updated 01/02/2022 Alicia Cox is a 51 y.o. female coming in with complaint of right shoulder pain. Constant shoulder pain. Never fully went away. Sharp pain sporadically and sometimes radiates down arm. Sometimes fingertips will be numb (painful numb) and does wake her up at night. No interventions help. Has to take tylenol to sleep.       Past Medical History:  Diagnosis Date   Acute bronchitis 11/03/2013   Allergy    hay fever   Arthritis    Blood in stool    Cervical cancer screening 09/14/2016   Menarche at 13 Regular and heavy flow no history of abnormal pap in past G2P2, s/p 2 svd No history of abnormal MGM No concerns today  gyn surgeries: tubal and endometrial ablation LMP 09/13/2016   Chest pain 08/24/2013   Chicken pox as a child   Dermatitis 03/02/2016   Frequent headaches    GERD  (gastroesophageal reflux disease)    Heart murmur    Hypothyroidism    Hashimoto's   Knee pain, left 01/09/2014   Low back pain 08/26/2017   Migraine    Obesity 11/15/2014   Preventative health care 09/12/2015   TOS (thoracic outlet syndrome) 08/25/2013   UTI (urinary tract infection)    Vitamin D deficiency    Past Surgical History:  Procedure Laterality Date   APPENDECTOMY     CESAREAN SECTION     twice   CHOLECYSTECTOMY     ENDOMETRIAL ABLATION     TONSILLECTOMY     TUBAL LIGATION  1999   Social History   Socioeconomic History   Marital status: Married    Spouse name: Not on file   Number of children: Not on file   Years of education: Not on file   Highest education level: Not on file  Occupational History   Not on file  Tobacco Use   Smoking status: Never   Smokeless tobacco: Never   Tobacco comments:    NEVER USED TOBACCO  Vaping Use   Vaping Use: Never used  Substance and Sexual Activity   Alcohol use: No    Alcohol/week: 0.0 standard drinks   Drug use: No   Sexual activity: Yes    Partners: Male    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, no dietary restrictions, 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  Other Topics Concern   Not on file  Social History Narrative  Not on file   Social Determinants of Health   Financial Resource Strain: Not on file  Food Insecurity: Not on file  Transportation Needs: Not on file  Physical Activity: Not on file  Stress: Not on file  Social Connections: Not on file   No Known Allergies Family History  Problem Relation Age of Onset   Cancer Mother        skin cancer   Hyperlipidemia Mother    Hypertension Mother    Skin cancer Mother    Diabetes Father        type 2   Cancer Sister        BCC   Hypertension Sister    Diabetes Sister    Asthma Daughter    Heart disease Maternal Grandmother    Diabetes Maternal Grandmother    Heart attack Maternal Grandfather    Diabetes Maternal Grandfather    Diabetes Paternal Grandmother     Diabetes Paternal Grandfather     Current Outpatient Medications (Endocrine & Metabolic):    levothyroxine (SYNTHROID) 100 MCG tablet, TAKE ONE TABLET BY MOUTH ONCE DAILY EXCEPT TAKE 1.5 TABLETS ON THURSDAYS AND SUNDAYS.   Current Outpatient Medications (Respiratory):    albuterol (VENTOLIN HFA) 108 (90 Base) MCG/ACT inhaler, Inhale 2 puffs into the lungs every 6 (six) hours as needed for wheezing or shortness of breath.   montelukast (SINGULAIR) 10 MG tablet, Take 1 tablet (10 mg total) by mouth at bedtime.  Current Outpatient Medications (Analgesics):    aspirin 81 MG EC tablet, Take by mouth.   meloxicam (MOBIC) 7.5 MG tablet, Take 1 tablet (7.5 mg total) by mouth daily.   Current Outpatient Medications (Other):    buPROPion (WELLBUTRIN XL) 150 MG 24 hr tablet, Take 1 tablet (150 mg total) by mouth 2 (two) times daily.   dicyclomine (BENTYL) 10 MG capsule, Take 1 capsule (10 mg total) by mouth 3 (three) times daily as needed for spasms.   escitalopram (LEXAPRO) 10 MG tablet, Take 1 tablet by mouth once daily   famotidine (PEPCID) 40 MG tablet, Take 1 tablet (40 mg total) by mouth at bedtime.   gabapentin (NEURONTIN) 300 MG capsule, nightly   tiZANidine (ZANAFLEX) 4 MG tablet, Take 0.5-1 tablets (2-4 mg total) by mouth every 8 (eight) hours as needed for muscle spasms.   Reviewed prior external information including notes and imaging from  primary care provider As well as notes that were available from care everywhere and other healthcare systems.  Past medical history, social, surgical and family history all reviewed in electronic medical record.  No pertanent information unless stated regarding to the chief complaint.   Review of Systems:  No headache, visual changes, nausea, vomiting, diarrhea, constipation, dizziness, abdominal pain, skin rash, fevers, chills, night sweats, weight loss, swollen lymph nodes, body aches, joint swelling, chest pain, shortness of breath, mood  changes. POSITIVE muscle aches  Objective  Blood pressure 126/90, pulse 84, height 5\' 4"  (1.626 m), weight 255 lb (115.7 kg), SpO2 97 %.   General: No apparent distress alert and oriented x3 mood and affect normal, dressed appropriately.  HEENT: Pupils equal, extraocular movements intact  Respiratory: Patient's speak in full sentences and does not appear short of breath  Cardiovascular: No lower extremity edema, non tender, no erythema  Gait normal with good balance and coordination.  MSK: Neck exam does have some loss of lordosis.  Positive Spurling's noted.  Weakness noted in the C7 and C8 distribution on the right side.  Patient's right shoulder positive crossover.  Tenderness to palpation of the acromioclavicular joint.  Procedure: Real-time Ultrasound Guided Injection of right acromioclavicular joint Device: GE Logiq Q7 Ultrasound guided injection is preferred based studies that show increased duration, increased effect, greater accuracy, decreased procedural pain, increased response rate, and decreased cost with ultrasound guided versus blind injection.  Verbal informed consent obtained.  Time-out conducted.  Noted no overlying erythema, induration, or other signs of local infection.  Skin prepped in a sterile fashion.  Local anesthesia: Topical Ethyl chloride.  With sterile technique and under real time ultrasound guidance: With a 25-gauge half inch needle injected with 0.5 cc of 0.5% Marcaine and 0.5 cc of Kenalog 40 mg/mL Completed without difficulty  Pain immediately resolved suggesting accurate placement of the medication.  Advised to call if fevers/chills, erythema, induration, drainage, or persistent bleeding.  Impression: Technically successful ultrasound guided injection.    Impression and Recommendations:     The above documentation has been reviewed and is accurate and complete Judi SaaZachary M Channon Ambrosini, DO

## 2022-01-02 ENCOUNTER — Ambulatory Visit (INDEPENDENT_AMBULATORY_CARE_PROVIDER_SITE_OTHER): Payer: 59 | Admitting: Family Medicine

## 2022-01-02 ENCOUNTER — Other Ambulatory Visit: Payer: Self-pay

## 2022-01-02 ENCOUNTER — Encounter: Payer: Self-pay | Admitting: Family Medicine

## 2022-01-02 ENCOUNTER — Ambulatory Visit (INDEPENDENT_AMBULATORY_CARE_PROVIDER_SITE_OTHER): Payer: 59

## 2022-01-02 ENCOUNTER — Ambulatory Visit: Payer: Self-pay

## 2022-01-02 ENCOUNTER — Other Ambulatory Visit: Payer: Self-pay | Admitting: Family Medicine

## 2022-01-02 VITALS — BP 126/90 | HR 84 | Ht 64.0 in | Wt 255.0 lb

## 2022-01-02 DIAGNOSIS — M19019 Primary osteoarthritis, unspecified shoulder: Secondary | ICD-10-CM | POA: Insufficient documentation

## 2022-01-02 DIAGNOSIS — M25511 Pain in right shoulder: Secondary | ICD-10-CM

## 2022-01-02 DIAGNOSIS — M19011 Primary osteoarthritis, right shoulder: Secondary | ICD-10-CM

## 2022-01-02 NOTE — Assessment & Plan Note (Signed)
Acromioclavicular injection was given today.  Tolerated the procedure well.  Concern for more of a cervical radiculopathy.  Patient is having difficulty with her neck previously.  MRI in 2019 showed some possible very mild nerve root impingement at C5-C6.  We will get x-rays today to see if there is progressive.  Patient is having weakness noted in the C8 distribution of the Concerned that it could be more of a cervical radiculopathy.  Patient has no pain over the elbow at this time.  Follow-up again in 6 to 8 weeks.

## 2022-01-02 NOTE — Assessment & Plan Note (Signed)
Injection given today and tolerated the procedure well, discussed icing regimen and home exercises.  Discussed which activities to do which wants to avoid.  Increase again in 6 to 8 weeks.

## 2022-01-02 NOTE — Patient Instructions (Addendum)
Injection today Xray today See you again in 4-6 weeks If not better consider epidural

## 2022-01-03 ENCOUNTER — Encounter: Payer: Self-pay | Admitting: Family Medicine

## 2022-01-03 MED ORDER — ESCITALOPRAM OXALATE 10 MG PO TABS: 10.0000 mg | ORAL_TABLET | Freq: Every day | ORAL | 0 refills | Status: DC

## 2022-01-13 ENCOUNTER — Other Ambulatory Visit: Payer: Self-pay | Admitting: Family Medicine

## 2022-01-13 DIAGNOSIS — E039 Hypothyroidism, unspecified: Secondary | ICD-10-CM

## 2022-01-19 ENCOUNTER — Encounter: Payer: Self-pay | Admitting: Family Medicine

## 2022-01-22 ENCOUNTER — Other Ambulatory Visit: Payer: Self-pay | Admitting: Family Medicine

## 2022-01-22 MED ORDER — DOXYCYCLINE HYCLATE 100 MG PO TABS: 100.0000 mg | ORAL_TABLET | Freq: Two times a day (BID) | ORAL | 0 refills | Status: DC

## 2022-01-22 MED ORDER — METHYLPREDNISOLONE 4 MG PO TABS: ORAL_TABLET | ORAL | 0 refills | Status: DC

## 2022-01-27 ENCOUNTER — Other Ambulatory Visit: Payer: Self-pay | Admitting: Family Medicine

## 2022-01-27 DIAGNOSIS — R059 Cough, unspecified: Secondary | ICD-10-CM

## 2022-02-01 ENCOUNTER — Other Ambulatory Visit: Payer: Self-pay | Admitting: Family Medicine

## 2022-02-06 ENCOUNTER — Ambulatory Visit (INDEPENDENT_AMBULATORY_CARE_PROVIDER_SITE_OTHER): Payer: 59 | Admitting: Internal Medicine

## 2022-02-06 ENCOUNTER — Encounter: Payer: Self-pay | Admitting: Family Medicine

## 2022-02-06 ENCOUNTER — Encounter: Payer: Self-pay | Admitting: Internal Medicine

## 2022-02-06 ENCOUNTER — Other Ambulatory Visit: Payer: Self-pay

## 2022-02-06 VITALS — BP 120/80 | HR 73 | Temp 98.5°F | Ht 64.0 in | Wt 254.8 lb

## 2022-02-06 DIAGNOSIS — R053 Chronic cough: Secondary | ICD-10-CM | POA: Diagnosis not present

## 2022-02-06 DIAGNOSIS — J4530 Mild persistent asthma, uncomplicated: Secondary | ICD-10-CM

## 2022-02-06 LAB — POCT EXHALED NITRIC OXIDE: FeNO level (ppb): 6

## 2022-02-06 MED ORDER — FLUTICASONE FUROATE-VILANTEROL 100-25 MCG/ACT IN AEPB: 1.0000 | INHALATION_SPRAY | Freq: Every day | RESPIRATORY_TRACT | 5 refills | Status: DC

## 2022-02-06 MED ORDER — OMEPRAZOLE 40 MG PO CPDR
40.0000 mg | DELAYED_RELEASE_CAPSULE | Freq: Every day | ORAL | 5 refills | Status: DC
Start: 2022-02-06 — End: 2022-07-31

## 2022-02-06 NOTE — Progress Notes (Signed)
Alicia Cox    [id number]    10/01/1971  Primary Care Physician:Blyth, Bryon Lions, MD  Referring Physician: Bradd Canary, MD [address] [place],  [place] [address] Reason for Consultation: bronchitis Date of Consultation: 02/06/2022  Chief complaint:   Chief Complaint  Patient presents with   Consult    Cough since Sept 2022.  Has had bronchitis 3 times since September, last time was 2 weeks ago.  She has been treated with antibiotics and steroids.  Mucous is white.     HPI:  Alicia Cox is a 51 y.o. woman who presents for cough and bronchitis. Reports daily morning cough with sputum production first thing in the morning cough. She takes cetirizine, singulair and flonase for seasonal allergies. She increased her zyrtec to twice a day but hasn't noticed a difference.   She has an albuterol inhaler which she has taken for the coughing episodes, which only sometimes helps.   She gets short of breath with exertion such as walking for too long. She hasn't tried taking albuterol then.  Has had three episodes of bronchitis since September, treated with amoxicillin with partial response. Second course of prednisone and abx helped completely. Third course with doxycycline and prednisone also helped.   Her cough does wake her up at night. She has symptoms during the day as well, usually with eating. Coughing spells last 15-20 minutes. She takes pepcid which helps with the reflux.    No childhood respiratory disease, father has COPD and grandfather has emphysema.     Social history:  Occupation: Runner, broadcasting/film/video Exposures: lives at home with husband and daughter, two dogs, she is not allergic  Smoking history: passive smoke exposure in childhood, she has never smoked.   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    Relevant family history:  Family History  Problem Relation Age of Onset   Cancer Mother        skin cancer   Hyperlipidemia Mother    Hypertension Mother    Skin cancer Mother    Diabetes Father        type 2   Cancer Sister        BCC   Hypertension Sister    Diabetes Sister    Asthma Daughter    Heart disease Maternal Grandmother    Diabetes Maternal Grandmother    Heart attack Maternal Grandfather    Diabetes Maternal Grandfather    Diabetes Paternal Grandmother    Diabetes Paternal Grandfather     Past Medical History:  Diagnosis Date   Acute bronchitis 11/03/2013   Allergy    hay fever   Arthritis    Blood in stool    Cervical cancer screening 09/14/2016   Menarche at 13 Regular and heavy flow no history of abnormal pap in past G2P2, s/p 2 svd No history of abnormal MGM No concerns today  gyn surgeries: tubal and endometrial ablation LMP 09/13/2016  Chest pain 08/24/2013   Chicken pox as a child   Dermatitis 03/02/2016   Frequent headaches    GERD (gastroesophageal reflux disease)    Heart murmur    Hypothyroidism    Hashimoto's   Knee pain, left 01/09/2014   Low back pain 08/26/2017   Migraine    Obesity 11/15/2014   Preventative health care 09/12/2015   TOS (thoracic outlet syndrome) 08/25/2013   UTI (urinary tract infection)    Vitamin D deficiency     Past Surgical History:  Procedure Laterality Date   APPENDECTOMY     CESAREAN SECTION     twice   CHOLECYSTECTOMY     ENDOMETRIAL ABLATION     TONSILLECTOMY     TUBAL LIGATION  1999     Physical Exam: Blood pressure 120/80, pulse 73, temperature 98.5 F (36.9 C), temperature source Oral, height 5\' 4"  (1.626 m), weight 254 lb 12.8 oz (115.6 kg), SpO2 99  %. Gen:      No acute distress ENT:  no nasal polyps, mucus membranes moist Lungs:    No increased respiratory effort, symmetric chest wall excursion, clear to auscultation bilaterally, no wheezes or crackles CV:         Regular rate and rhythm; no murmurs, rubs, or gallops.  No pedal edema Abd:      + bowel sounds; soft, non-tender; no distension MSK: no acute synovitis of DIP or PIP joints, no mechanics hands.  Skin:      Warm and dry; no rashes Neuro: normal speech, no focal facial asymmetry Psych: alert and oriented x3, normal mood and affect   Data Reviewed/Medical Decision Making:  Independent interpretation of tests: Imaging:  PFTs: I have personally reviewed the patient's PFTs and spirometry showed no airflow limitation Feno 6 ppb No flowsheet data found.  Labs:  Lab Results  Component Value Date   WBC 10.8 (H) 07/05/2021   HGB 14.6 07/05/2021   HCT 42.8 07/05/2021   MCV 97.1 07/05/2021   PLT 366.0 07/05/2021   Lab Results  Component Value Date   NA 138 07/05/2021   K 4.2 07/05/2021   CL 102 07/05/2021   CO2 28 07/05/2021      Immunization status:  Immunization History  Administered Date(s) Administered   Influenza,inj,Quad PF,6+ Mos 10/21/2013, 08/14/2014, 09/02/2015, 09/14/2016, 09/17/2017, 09/26/2019   Influenza-Unspecified 09/24/2018   PFIZER(Purple Top)SARS-COV-2 Vaccination 07/30/2020, 08/20/2020   Tdap 10/21/2013     I reviewed prior external note(s) from PCP  I reviewed the result(s) of the labs and imaging as noted above.   I have ordered PFT   Assessment:  Chronic Bronchitis Passive Smoke exposure Post nasal drainage GERD  Plan/Recommendations: Suspect cough is multifactorial from post nasal drainage and GERD Continue flonase, singulair, cetirizine. Will stop pepcid and switch to omeprazole.  Continue albuterol prn. Will trial breo  We discussed disease management and progression at length today especially lifestyle modification for  GERD   Return to Care: Return in about 6 weeks (around 03/20/2022).  05/20/2022, MD Pulmonary and Critical Care Medicine [facility] Office:[phone]  CC: Durel Salts, MD

## 2022-02-06 NOTE — Patient Instructions (Addendum)
Please schedule follow up scheduled with myself in 6 weeks.  If my schedule is not open yet, we will contact you with a reminder closer to that time. Please call [phone] if you haven't heard from [place] a month before.   Before your next visit I would like you to have:  Spirometry/Feno  Start using Breo 1 puff once a day. Gargle after use Start omeprazole 40 mg once a day for reflux  Take the albuterol rescue inhaler every 4 to 6 hours as needed for wheezing or shortness of breath. You can also take it 15 minutes before exercise or exertional activity. Side effects include heart racing or pounding, jitters or anxiety. If you have a history of an irregular heart rhythm, it can make this worse. Can also give some patients a hard time sleeping.  To inhale the aerosol using an inhaler, follow these steps:  Remove the protective dust cap from the end of the mouthpiece. If the dust cap was not placed on the mouthpiece, check the mouthpiece for dirt or other objects. Be sure that the canister is fully and firmly inserted in the mouthpiece. 2. If you are using the inhaler for the first time or if you have not used the inhaler in more than 14 days, you will need to prime it. You may also need to prime the inhaler if it has been dropped. Ask your pharmacist or check the manufacturer's information if this happens. To prime the inhaler, shake it well and then press down on the canister 4 times to release 4 sprays into the air, away from your face. Be careful not to get albuterol in your eyes. 3. Shake the inhaler well. 4. Breathe out as completely as possible through your mouth. 4. Hold the canister with the mouthpiece on the bottom, facing you and the canister pointing upward. Place the open end of the mouthpiece into your mouth. Close your lips tightly around the mouthpiece. 6. Breathe in slowly and deeply through the mouthpiece.At the same time, press down once on the container to spray the medication  into your mouth. 7. Try to hold your breath for 10 seconds. remove the inhaler, and breathe out slowly. 8. If you were told to use 2 puffs, wait 1 minute and then repeat steps 3-7. 9. Replace the protective cap on the inhaler. 10. Clean your inhaler regularly. Follow the manufacturer's directions carefully and ask your doctor or pharmacist if you have any questions about cleaning your inhaler.  Check the back of the inhaler to keep track of the total number of doses left on the inhaler.    What is GERD? Gastroesophageal reflux disease (GERD) is gastroesophageal reflux diseasewhich occurs when the lower esophageal sphincter (LES) opens spontaneously, for varying periods of time, or does not close properly and stomach contents rise up into the esophagus. GER is also called acid reflux or acid regurgitation, because digestive juices--called acids--rise up with the food. The esophagus is the tube that carries food from the mouth to the stomach. The LES is a ring of muscle at the bottom of the esophagus that acts like a valve between the esophagus and stomach.  When acid reflux occurs, food or fluid can be tasted in the back of the mouth. When refluxed stomach acid touches the lining of the esophagus it may cause a burning sensation in the chest or throat called heartburn or acid indigestion. Occasional reflux is common. Persistent reflux that occurs more than twice a week is considered  GERD, and it can eventually lead to more serious health problems. People of all ages can have GERD. Studies have shown that GERD may worsen or contribute to asthma, chronic cough, and pulmonary fibrosis.   What are the symptoms of GERD? The main symptom of GERD in adults is frequent heartburn, also called acid indigestion--burning-type pain in the lower part of the mid-chest, behind the breast bone, and in the mid-abdomen.  Not all reflux is acidic in nature, and many patients don't have heart burn at all. Sometimes it  feels like a cough (either dry or with mucus), choking sensation, asthma, shortness of breath, waking up at night, frequent throat clearing, or trouble swallowing.    What causes GERD? The reason some people develop GERD is still unclear. However, research shows that in people with GERD, the LES relaxes while the rest of the esophagus is working. Anatomical abnormalities such as a hiatal hernia may also contribute to GERD. A hiatal hernia occurs when the upper part of the stomach and the LES move above the diaphragm, the muscle wall that separates the stomach from the chest. Normally, the diaphragm helps the LES keep acid from rising up into the esophagus. When a hiatal hernia is present, acid reflux can occur more easily. A hiatal hernia can occur in people of any age and is most often a normal finding in otherwise healthy people over age 67. Most of the time, a hiatal hernia produces no symptoms.   Other factors that may contribute to GERD include - Obesity or recent weight gain - Pregnancy  - Smoking  - Diet - Certain medications  Common foods that can worsen reflux symptoms include: - carbonated beverages - artificial sweeteners - citrus fruits  - chocolate  - drinks with caffeine or alcohol  - fatty and fried foods  - garlic and onions  - mint flavorings  - spicy foods  - tomato-based foods, like spaghetti sauce, salsa, chili, and pizza   Lifestyle Changes If you smoke, stop.  Avoid foods and beverages that worsen symptoms (see above.) Lose weight if needed.  Eat small, frequent meals.  Wear loose-fitting clothes.  Avoid lying down for 3 hours after a meal.  Raise the head of your bed 6 to 8 inches by securing wood blocks under the bedposts. Just using extra pillows will not help, but using a wedge-shaped pillow may be helpful.  Medications  H2 blockers, such as cimetidine (Tagamet HB), famotidine (Pepcid AC), nizatidine (Axid AR), and ranitidine (Zantac 75), decrease acid  production. They are available in prescription strength and over-the-counter strength. These drugs provide short-term relief and are effective for about half of those who have GERD symptoms.  Proton pump inhibitors include omeprazole (Prilosec, Zegerid), lansoprazole (Prevacid), pantoprazole (Protonix), rabeprazole (Aciphex), and esomeprazole (Nexium), which are available by prescription. Prilosec is also available in over-the-counter strength. Proton pump inhibitors are more effective than H2 blockers and can relieve symptoms and heal the esophageal lining in almost everyone who has GERD.  Because drugs work in different ways, combinations of medications may help control symptoms. People who get heartburn after eating may take both antacids and H2 blockers. The antacids work first to neutralize the acid in the stomach, and then the H2 blockers act on acid production. By the time the antacid stops working, the H2 blocker will have stopped acid production. Your health care provider is the best source of information about how to use medications for GERD.   Points to Remember 1. You can  have GERD without having heartburn. Your symptoms could include a dry cough, asthma symptoms, or trouble swallowing.  2. Taking medications daily as prescribed is important in controlling you symptoms.  Sometimes it can take up to 8 weeks to fully achieve the effects of the medications prescribed.  3. Coughing related to GERD can be difficult to treat and is very frustrating!  However, it is important to stick with these medications and lifestyle modifications before pursuing more aggressive or invasive test and treatments.

## 2022-02-06 NOTE — Progress Notes (Signed)
The patient has been prescribed the inhaler Breo. Inhaler technique was demonstrated to patient. The patient subsequently demonstrated correct technique.   

## 2022-02-07 MED ORDER — ALBUTEROL SULFATE HFA 108 (90 BASE) MCG/ACT IN AERS: 2.0000 | INHALATION_SPRAY | Freq: Four times a day (QID) | RESPIRATORY_TRACT | 5 refills | Status: DC | PRN

## 2022-02-07 NOTE — Telephone Encounter (Signed)
Please advise if there is a cheaper alternative to [facility] for pt. Thanks.

## 2022-02-07 NOTE — Progress Notes (Signed)
[name] [address] [place] [address] Phone: [phone] Subjective:   Alicia Cox, am serving as a scribe for Dr. Antoine Primas. This visit occurred during the SARS-CoV-2 public health emergency.  Safety protocols were in place, including screening questions prior to the visit, additional usage of staff PPE, and extensive cleaning of exam room while observing appropriate contact time as indicated for disinfecting solutions.  I'm seeing this patient by the request  of:  Bradd Canary, MD  CC: shoulder pain   [id number]  01/02/2022 Injection given today and tolerated the procedure well, discussed icing regimen and home exercises.  Discussed which activities to do which wants to avoid.  Increase again in 6 to 8 weeks.  Acromioclavicular injection was given today.  Tolerated the procedure well.  Concern for more of a cervical radiculopathy.  Patient is having difficulty with her neck previously.  MRI in 2019 showed some possible very mild nerve root impingement at C5-C6.  We will get x-rays today to see if there is progressive.  Patient is having weakness noted in the C8 distribution of the Concerned that it could be more of a cervical radiculopathy.  Patient has no pain over the elbow at this time.  Follow-up again in 6 to 8 weeks.  Updated 02/08/2022 Alicia Cox is a 51 y.o. female coming in with complaint of R shoulder pain. Patient states that she had some relief from injection but shoulder still painful with flexion. Noticed pain with vacuuming the other day.    Xray IMPRESSION: Mild uncovertebral degenerative changes. No other abnormalities are identified.  Shoulder xray (-)     Past Medical History:  Diagnosis Date   Acute bronchitis 11/03/2013   Allergy    hay fever   Arthritis    Blood in stool    Cervical cancer screening 09/14/2016   Menarche at 13 Regular and heavy flow no history of abnormal pap in past G2P2,  s/p 2 svd No history of abnormal MGM No concerns today  gyn surgeries: tubal and endometrial ablation LMP 09/13/2016   Chest pain 08/24/2013   Chicken pox as a child   Dermatitis 03/02/2016   Frequent headaches    GERD (gastroesophageal reflux disease)    Heart murmur    Hypothyroidism    Hashimoto's   Knee pain, left 01/09/2014   Low back pain 08/26/2017   Migraine    Obesity 11/15/2014   Preventative health care 09/12/2015   TOS (thoracic outlet syndrome) 08/25/2013   UTI (urinary tract infection)    Vitamin D deficiency    Past Surgical History:  Procedure Laterality Date   APPENDECTOMY     CESAREAN SECTION     twice   CHOLECYSTECTOMY     ENDOMETRIAL ABLATION     TONSILLECTOMY     TUBAL LIGATION  1999   Social History   Socioeconomic History   Marital status: Married    Spouse name: Not on file   Number of children: Not on file   Years of education: Not on file   Highest education level: Not on file  Occupational History   Not on file  Tobacco Use   Smoking status: Never   Smokeless tobacco: Never   Tobacco comments:    NEVER USED TOBACCO  Vaping Use   Vaping Use: Never used  Substance and Sexual Activity   Alcohol use: No    Alcohol/week: 0.0 standard drinks   Drug use: No  Sexual activity: Yes    Partners: Male    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, no dietary restrictions, 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  Other Topics Concern   Not on file  Social History Narrative   Not on file   Social Determinants of Health   Financial Resource Strain: Not on file  Food Insecurity: Not on file  Transportation Needs: Not on file  Physical Activity: Not on file  Stress: Not on file  Social Connections: Not on file   No Known Allergies Family History  Problem Relation Age of Onset   Cancer Mother        skin cancer   Hyperlipidemia Mother    Hypertension Mother    Skin cancer Mother    Diabetes Father        type 2   Cancer Sister        BCC   Hypertension Sister     Diabetes Sister    Asthma Daughter    Heart disease Maternal Grandmother    Diabetes Maternal Grandmother    Heart attack Maternal Grandfather    Diabetes Maternal Grandfather    Diabetes Paternal Grandmother    Diabetes Paternal Grandfather     Current Outpatient Medications (Endocrine & Metabolic):    levothyroxine (SYNTHROID) 100 MCG tablet, TAKE 1 TABLET BY MOUTH ONCE DAILY EXCEPT TAKE 1 & 1/2 (ONE & ONE-HALF) TABLETS ONCE DAILY ON THURSDAYS AND SUNDAYS   Current Outpatient Medications (Respiratory):    albuterol (VENTOLIN HFA) 108 (90 Base) MCG/ACT inhaler, Inhale 2 puffs into the lungs every 6 (six) hours as needed for wheezing or shortness of breath.   fluticasone furoate-vilanterol (BREO ELLIPTA) 100-25 MCG/ACT AEPB, Inhale 1 puff into the lungs daily.   montelukast (SINGULAIR) 10 MG tablet, Take 1 tablet (10 mg total) by mouth at bedtime.  Current Outpatient Medications (Analgesics):    aspirin 81 MG EC tablet, Take by mouth.   meloxicam (MOBIC) 7.5 MG tablet, Take 1 tablet by mouth once daily   Current Outpatient Medications (Other):    buPROPion (WELLBUTRIN XL) 150 MG 24 hr tablet, Take 1 tablet (150 mg total) by mouth 2 (two) times daily.   dicyclomine (BENTYL) 10 MG capsule, Take 1 capsule (10 mg total) by mouth 3 (three) times daily as needed for spasms.   escitalopram (LEXAPRO) 10 MG tablet, Take 1 tablet by mouth once daily   famotidine (PEPCID) 40 MG tablet, Take 1 tablet (40 mg total) by mouth at bedtime.   gabapentin (NEURONTIN) 300 MG capsule, nightly   omeprazole (PRILOSEC) 40 MG capsule, Take 1 capsule (40 mg total) by mouth daily.   tiZANidine (ZANAFLEX) 4 MG tablet, Take 0.5-1 tablets (2-4 mg total) by mouth every 8 (eight) hours as needed for muscle spasms.   Reviewed prior external information including notes and imaging from  primary care provider As well as notes that were available from care everywhere and other healthcare systems.  Past medical  history, social, surgical and family history all reviewed in electronic medical record.  No pertanent information unless stated regarding to the chief complaint.   Review of Systems:  No headache, visual changes, nausea, vomiting, diarrhea, constipation, dizziness, abdominal pain, skin rash, fevers, chills, night sweats, weight loss, swollen lymph nodes, body aches, joint swelling, chest pain, shortness of breath, mood changes. POSITIVE muscle aches  Objective  Blood pressure 102/84, pulse 96, height 5\' 4"  (1.626 m), weight 256 lb (116.1 kg), SpO2 99 %.   General: No apparent distress alert  and oriented x3 mood and affect normal, dressed appropriately.  HEENT: Pupils equal, extraocular movements intact  Respiratory: Patient's speak in full sentences and does not appear short of breath  Cardiovascular: No lower extremity edema, non tender, no erythema  Gait normal with good balance and coordination.  MSK: Patient neck exam does have some mild loss of lordosis.  Continues to have pain in the right shoulder, positive impingement noted.  Rotator cuff appears to be intact but does have positive crossover and positive O'Brien's. Tightness noted in the parascapular region on the right side.  Osteopathic findings C3 flexed rotated and side bent right T3 extended rotated and side bent right inhaled third rib L3 flexed rotated and side bent right Sacrum right on right    Impression and Recommendations:     The above documentation has been reviewed and is accurate and complete Judi Saa, DO

## 2022-02-08 ENCOUNTER — Ambulatory Visit (INDEPENDENT_AMBULATORY_CARE_PROVIDER_SITE_OTHER): Payer: 59 | Admitting: Family Medicine

## 2022-02-08 ENCOUNTER — Other Ambulatory Visit: Payer: Self-pay

## 2022-02-08 ENCOUNTER — Ambulatory Visit: Payer: Self-pay

## 2022-02-08 ENCOUNTER — Encounter: Payer: Self-pay | Admitting: Family Medicine

## 2022-02-08 ENCOUNTER — Other Ambulatory Visit (HOSPITAL_COMMUNITY): Payer: Self-pay

## 2022-02-08 VITALS — BP 102/84 | HR 96 | Ht 64.0 in | Wt 256.0 lb

## 2022-02-08 DIAGNOSIS — M25511 Pain in right shoulder: Secondary | ICD-10-CM

## 2022-02-08 DIAGNOSIS — Z0289 Encounter for other administrative examinations: Secondary | ICD-10-CM

## 2022-02-08 DIAGNOSIS — M9902 Segmental and somatic dysfunction of thoracic region: Secondary | ICD-10-CM | POA: Diagnosis not present

## 2022-02-08 DIAGNOSIS — M7551 Bursitis of right shoulder: Secondary | ICD-10-CM

## 2022-02-08 DIAGNOSIS — M9904 Segmental and somatic dysfunction of sacral region: Secondary | ICD-10-CM

## 2022-02-08 DIAGNOSIS — M9901 Segmental and somatic dysfunction of cervical region: Secondary | ICD-10-CM | POA: Diagnosis not present

## 2022-02-08 DIAGNOSIS — M5412 Radiculopathy, cervical region: Secondary | ICD-10-CM | POA: Diagnosis not present

## 2022-02-08 DIAGNOSIS — M9903 Segmental and somatic dysfunction of lumbar region: Secondary | ICD-10-CM

## 2022-02-08 DIAGNOSIS — M9908 Segmental and somatic dysfunction of rib cage: Secondary | ICD-10-CM | POA: Diagnosis not present

## 2022-02-08 NOTE — Patient Instructions (Signed)
Started manipulation today lets see how that goes If doesn't make improvement we'll need to MRI neck and shoulders See you again in 5-6 weeks

## 2022-02-09 ENCOUNTER — Other Ambulatory Visit: Payer: Self-pay | Admitting: Family Medicine

## 2022-02-09 NOTE — Assessment & Plan Note (Signed)
Patient has had radiculopathy previously, concerned that this could be potentially contributing to some more of the discomfort and pain as well.  We discussed icing regimen and home exercises, discussed which activities to do and which ones to avoid, increase activity slowly.  Patient did respond extremely well though to osteopathic manipulation.  Increase activity slowly.  Follow-up again in 6 to 8 weeks.

## 2022-02-09 NOTE — Assessment & Plan Note (Signed)
Discussed with patient at great length, discussed icing regimen and home exercises, discussed which activities to do and which ones to avoid, increase activity slowly.  We discussed that if patient continues to have difficulty I would like to consider the possibility of MRI with how long this has been going on.  Follow-up with me again

## 2022-02-17 NOTE — Telephone Encounter (Signed)
Dr. Celine Mans, the pt states Advair diskus is cheaper to the [facility]. Please advise if she can switch due to cost. Thanks! ?

## 2022-02-17 NOTE — Telephone Encounter (Signed)
Received a message from patient stating that the Advair will be cheaper for her compared to the [facility]. I did ask the patient if she remembered if her insurance told her it would be the Advair Diskus or HFA.  ? ?Dr. Celine Mans, please advise if you are ok with switching to Advair. Thanks!  ?

## 2022-02-20 MED ORDER — FLUTICASONE-SALMETEROL 250-50 MCG/ACT IN AEPB: 1.0000 | INHALATION_SPRAY | Freq: Two times a day (BID) | RESPIRATORY_TRACT | 5 refills | Status: DC

## 2022-02-22 ENCOUNTER — Encounter: Payer: Self-pay | Admitting: Family Medicine

## 2022-02-22 ENCOUNTER — Ambulatory Visit (INDEPENDENT_AMBULATORY_CARE_PROVIDER_SITE_OTHER): Payer: 59 | Admitting: Family Medicine

## 2022-02-22 ENCOUNTER — Other Ambulatory Visit (HOSPITAL_BASED_OUTPATIENT_CLINIC_OR_DEPARTMENT_OTHER): Payer: Self-pay

## 2022-02-22 ENCOUNTER — Ambulatory Visit (HOSPITAL_BASED_OUTPATIENT_CLINIC_OR_DEPARTMENT_OTHER)
Admission: RE | Admit: 2022-02-22 | Discharge: 2022-02-22 | Disposition: A | Discharge status: Home / Self Care | Payer: 59 | Source: Ambulatory Visit | Attending: Family Medicine | Admitting: Family Medicine

## 2022-02-22 ENCOUNTER — Other Ambulatory Visit: Payer: Self-pay

## 2022-02-22 VITALS — BP 129/73 | HR 81 | Temp 98.2°F | Resp 20 | Ht 64.0 in | Wt 249.6 lb

## 2022-02-22 DIAGNOSIS — R059 Cough, unspecified: Secondary | ICD-10-CM | POA: Insufficient documentation

## 2022-02-22 MED ORDER — HYDROCOD POLI-CHLORPHE POLI ER 10-8 MG/5ML PO SUER
5.0000 mL | Freq: Two times a day (BID) | ORAL | 0 refills | Status: AC | PRN
  Filled 2022-02-22: qty 50, 5d supply, fill #0

## 2022-02-22 MED ORDER — BENZONATATE 200 MG PO CAPS
200.0000 mg | ORAL_CAPSULE | Freq: Two times a day (BID) | ORAL | 0 refills | Status: DC | PRN
  Filled 2022-02-22: qty 20, 10d supply, fill #0

## 2022-02-22 NOTE — Progress Notes (Signed)
Acute Office Visit  Subjective:    Patient ID: NICLE CONNOLE, female    DOB: 09/11/1971, 51 y.o.   MRN: [id number]  Chief Complaint  Patient presents with   Cough    Started last Wednesday Had E-visit on Sunday, is currently on antibiotics  Chills, headache Shortness of breath, coughing a lot at night, sore throat Negative covid test- Sunday    HPI Patient is in today for cough.  Patient reports that for the past 4 to 5 months she has been treated off-and-on for recurrent cough, possible bronchitis/respiratory infections.  She has been on several different antibiotics and steroids.  She was able to get in with pulmonology a month or so ago and they believe there is an asthma and reflux component to her chronic cough.  They started her on Breo and she was starting to feel a little bit better until about a week and a half ago coughing seemed to worsen again along with some occasional wheezing.  She had a virtual visit and was started on Tessalon and doxycycline.  Reports she is still having a cough but it is not as productive and mucus is now clear whereas before it was discolored.  Cough is more hacking now and does keep her up at night.  She denies any other upper respiratory symptoms, no rhinorrhea, nasal congestion, sinus pressure, ear pain.  She does have occasional headaches from all the coughing.  She denies any weight loss, unusual fatigue (although she is slightly more tired than usual because the cough keeps her up at night).  States she has never been a smoker but did have secondhand smoke exposure through her parents.  Her dad's twin had lung cancer.      Past Medical History:  Diagnosis Date   Acute bronchitis 11/03/2013   Allergy    hay fever   Arthritis    Blood in stool    Cervical cancer screening 09/14/2016   Menarche at 13 Regular and heavy flow no history of abnormal pap in past G2P2, s/p 2 svd No history of abnormal MGM No concerns today  gyn surgeries: tubal  and endometrial ablation LMP 09/13/2016   Chest pain 08/24/2013   Chicken pox as a child   Dermatitis 03/02/2016   Frequent headaches    GERD (gastroesophageal reflux disease)    Heart murmur    Hypothyroidism    Hashimoto's   Knee pain, left 01/09/2014   Low back pain 08/26/2017   Migraine    Obesity 11/15/2014   Preventative health care 09/12/2015   TOS (thoracic outlet syndrome) 08/25/2013   UTI (urinary tract infection)    Vitamin D deficiency     Past Surgical History:  Procedure Laterality Date   APPENDECTOMY     CESAREAN SECTION     twice   CHOLECYSTECTOMY     ENDOMETRIAL ABLATION     TONSILLECTOMY     TUBAL LIGATION  1999    Family History  Problem Relation Age of Onset   Cancer Mother        skin cancer   Hyperlipidemia Mother    Hypertension Mother    Skin cancer Mother    Diabetes Father        type 2   Cancer Sister        BCC   Hypertension Sister    Diabetes Sister    Asthma Daughter    Heart disease Maternal Grandmother    Diabetes Maternal Grandmother  Heart attack Maternal Grandfather    Diabetes Maternal Grandfather    Diabetes Paternal Grandmother    Diabetes Paternal Grandfather     Social History   Socioeconomic History   Marital status: Married    Spouse name: Not on file   Number of children: Not on file   Years of education: Not on file   Highest education level: Not on file  Occupational History   Not on file  Tobacco Use   Smoking status: Never   Smokeless tobacco: Never   Tobacco comments:    NEVER USED TOBACCO  Vaping Use   Vaping Use: Never used  Substance and Sexual Activity   Alcohol use: No    Alcohol/week: 0.0 standard drinks   Drug use: No   Sexual activity: Yes    Partners: Male    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, no dietary restrictions, 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 18 g 5   aspirin 81 MG EC tablet Take by mouth.     buPROPion (WELLBUTRIN XL) 150 MG 24 hr tablet Take 1 tablet by mouth twice daily 180 tablet 1   dicyclomine (BENTYL) 10 MG capsule Take 1 capsule (10 mg total) by mouth 3 (three) times daily as needed for spasms. 90 capsule 0   escitalopram (LEXAPRO) 10 MG tablet Take 1 tablet by mouth once daily 90 tablet 1   famotidine (PEPCID) 40 MG tablet Take 1 tablet (40 mg total) by mouth at bedtime. 30 tablet 1   fluticasone-salmeterol (ADVAIR DISKUS) 250-50 MCG/ACT AEPB Inhale 1 puff into the lungs in the morning and at bedtime. 1 each 5   gabapentin (NEURONTIN) 300 MG capsule nightly 90 capsule 3   levothyroxine (SYNTHROID) 100 MCG tablet TAKE 1 TABLET BY MOUTH ONCE DAILY EXCEPT TAKE 1 & 1/2 (ONE & ONE-HALF) TABLETS ONCE DAILY ON THURSDAYS AND SUNDAYS 105 tablet 0   meloxicam (MOBIC) 7.5 MG tablet Take 1 tablet by mouth once daily 30 tablet 0   montelukast (SINGULAIR) 10 MG tablet Take 1 tablet (10 mg total) by mouth at bedtime. 90 tablet 3   omeprazole (PRILOSEC) 40 MG capsule Take 1 capsule (40 mg total) by mouth daily. 30 capsule 5   tiZANidine (ZANAFLEX) 4 MG tablet Take 0.5-1 tablets (2-4 mg total) by mouth every 8 (eight) hours as needed for muscle spasms. 40 tablet 2   No facility-administered medications prior to visit.    No Known Allergies  Review of Systems All review of systems negative except what is listed in the HPI     Objective:    Physical Exam Vitals reviewed.  Constitutional:      Appearance: Normal  appearance. She is obese.  HENT:     Head: Normocephalic and atraumatic.     Nose: Nose normal.     Mouth/Throat:     Mouth: Mucous membranes are  moist.     Pharynx: Oropharynx is clear.  Eyes:     Conjunctiva/sclera: Conjunctivae normal.  Cardiovascular:     Rate and Rhythm: Normal rate and regular rhythm.  Pulmonary:     Effort: Pulmonary effort is normal.     Comments: Diminished throughout (impaired d/t body habitus) Skin:    General: Skin is warm and dry.  Neurological:     General: No focal deficit present.     Mental Status: She is alert and oriented to person, place, and time. Mental status is at baseline.  Psychiatric:        Mood and Affect: Mood normal.        Behavior: Behavior normal.        Thought Content: Thought content normal.        Judgment: Judgment normal.        BP 129/73    Pulse 81    Temp 98.2 F (36.8 C)    Resp 20    Ht 5\' 4"  (1.626 m)    Wt 249 lb 9.6 oz (113.2 kg)    SpO2 98%    BMI 42.84 kg/m  Wt Readings from Last 3 Encounters:  02/22/22 249 lb 9.6 oz (113.2 kg)  02/08/22 256 lb (116.1 kg)  02/06/22 254 lb 12.8 oz (115.6 kg)    Health Maintenance Due  Topic Date Due   MAMMOGRAM  10/02/2019   COVID-19 Vaccine (3 - Booster for Pfizer series) 10/15/2020   INFLUENZA VACCINE  07/18/2021    There are no preventive care reminders to display for this patient.   Lab Results  Component Value Date   TSH 5.06 07/05/2021   Lab Results  Component Value Date   WBC 10.8 (H) 07/05/2021   HGB 14.6 07/05/2021   HCT 42.8 07/05/2021   MCV 97.1 07/05/2021   PLT 366.0 07/05/2021   Lab Results  Component Value Date   NA 138 07/05/2021   K 4.2 07/05/2021   CO2 28 07/05/2021   GLUCOSE 98 07/05/2021   BUN 7 07/05/2021   CREATININE 0.73 07/05/2021   BILITOT 0.3 07/05/2021   ALKPHOS 58 07/05/2021   AST 15 07/05/2021   ALT 18 07/05/2021   PROT 7.5 07/05/2021   ALBUMIN 4.3 07/05/2021   CALCIUM 9.6 07/05/2021   ANIONGAP 7 04/07/2020   GFR  96.62 07/05/2021   Lab Results  Component Value Date   CHOL 164 07/05/2021   Lab Results  Component Value Date   HDL 41.60 07/05/2021   Lab Results  Component Value Date   LDLCALC 89 07/05/2021   Lab Results  Component Value Date   TRIG 165.0 (H) 07/05/2021   Lab Results  Component Value Date   CHOLHDL 4 07/05/2021   Lab Results  Component Value Date   HGBA1C 6.3 07/05/2021       Assessment & Plan:    1. Cough, unspecified type Chest xray today since this has been going on for so long Continue following with pulmonology Continue GERD management Continue allergy/asthma management - inhaler, flonase, zyrtec Start Tessalon scheduled twice a day  Delsym as needed Tussionex as needed at bedtime to help you rest Stay well hydrated Sip on fluids frequently Voice rest, avoid throat clearing   - DG Chest 2 View; Future - benzonatate (TESSALON) 200 MG capsule; Take 1 capsule (200 mg total) by mouth 2 (two) times daily as needed for cough.  Dispense: 20 capsule; Refill: 0 - chlorpheniramine-HYDROcodone 10-8 MG/5ML; Take 5 mLs by  mouth every 12 (twelve) hours as needed for up to 5 days for cough (cough, will cause drowsiness.).  Dispense: 50 mL; Refill: 0   Please contact office for follow-up if symptoms do not improve or worsen. Seek emergency care if symptoms become severe.   Clayborne Danaaylor B Faithlyn Recktenwald, NP

## 2022-02-22 NOTE — Patient Instructions (Signed)
For cough: ?Chest xray today since this has been going on for so long ?Continue following with pulmonology ?Continue GERD management ?Continue allergy/asthma management - inhaler, flonase, zyrtec ?Start Tessalon scheduled twice a day  ?Delsym as needed ?Tussionex as needed at bedtime to help you rest ?Stay well hydrated ?Sip on fluids frequently ?Voice rest, avoid throat clearing  ?

## 2022-03-01 ENCOUNTER — Encounter: Payer: Self-pay | Admitting: Family Medicine

## 2022-03-02 ENCOUNTER — Encounter: Payer: Self-pay | Admitting: Internal Medicine

## 2022-03-02 NOTE — Telephone Encounter (Signed)
Please make appointment with APP.  ? ?Is she taking the advair? Symptoms abated with steroids in the past so suspecting asthma with UACS.  Would have her follow up with APP since I can't exclude thrush in her symptoms, and obtain region 2 allergy panel and absolute eosinophil count for blood work when she comes. Would also make sure her PPI is twice daily and do reflux education with her. If advair is not effective can stop. Chlorpheniramine and benzonatate may be helpful for short term cough suppression.  ? ? ?

## 2022-03-02 NOTE — Telephone Encounter (Signed)
Will forward to Dr. Shearon Stalls to advise on pt's worsening cough.  ?

## 2022-03-03 ENCOUNTER — Telehealth: Payer: Self-pay | Admitting: *Deleted

## 2022-03-03 NOTE — Telephone Encounter (Signed)
ATC patient x1, reached a busy signal.  Sent mychart message to contact the office to go over recommendations and schedule follow up.  Will await response from patient. ? ? ?Please make appointment with APP.  ?  ?Is she taking the advair? Symptoms abated with steroids in the past so suspecting asthma with UACS.  Would have her follow up with APP since I can't exclude thrush in her symptoms, and obtain region 2 allergy panel and absolute eosinophil count for blood work when she comes. Would also make sure her PPI is twice daily and do reflux education with her. If advair is not effective can stop. Chlorpheniramine and benzonatate may be helf for short term cough suppression. ? ?Just wanted to see if I could touch base with you. I got sick on March 1st. I think I had the glue I?m not 100% sure. I suffered really bad coughing to the point of being miserable. I did a teledoc call that Sunday on the 5th and he said I had bronchitis and prescribed me doxycycline and tessalon to take 2  gel tablets 3x a day. I still wasn?t any better that Wednesday. It seemed like my phlegm had changed from dark yellow to white with the coughing but that was all. So I went to my primary care and saw the nurse practitioner she prescribed me some more tessalon and cough medicine to take at night and to take desilym as needed and to rest my voice completely. Which I did for 4 days but I had to go back to work this past Monday.,I have still been coughing but yesterday it was really bad to the point I coughed continuously for 20 minutes and it led to vomiting just straight water up. Afterwards I was completely red in the face & felt like I was so hot I was about to pass out. My husband almost called 911 it was so bad. I have coughed on and off all night last night and all day today. My throat is extremely sore. It hurts really bad when I swallow from all the coughing last night. I t messaged the nurse practitioner I saw last week and she said I  need to see you for anything further. I?m not sure I can wait til April with this coughing being so bad. I have had to miss a week of work and I?m still no better and I am miserable. pful for short term cough suppression.  ?

## 2022-03-03 NOTE — Telephone Encounter (Signed)
Called and spoke with patient about ND's recommendations. She stated that she has not started the Advair yet due to her insurance. When she went to the pharmacy to get the refill, the pharmacist told her it was too soon since she got the [place]. She will finish the [facility] that she has on hand.  ? ?She is aware of the blood work that will be needed next week.  ? ?She will call us back if anything changes.  ? ?Nothing further needed at time of call.  ?

## 2022-03-08 ENCOUNTER — Encounter (INDEPENDENT_AMBULATORY_CARE_PROVIDER_SITE_OTHER): Payer: Self-pay | Admitting: Family Medicine

## 2022-03-08 ENCOUNTER — Ambulatory Visit (INDEPENDENT_AMBULATORY_CARE_PROVIDER_SITE_OTHER): Payer: 59 | Admitting: Family Medicine

## 2022-03-08 ENCOUNTER — Other Ambulatory Visit: Payer: Self-pay

## 2022-03-08 VITALS — BP 122/80 | HR 76 | Temp 98.4°F | Ht 64.0 in | Wt 252.0 lb

## 2022-03-08 DIAGNOSIS — E038 Other specified hypothyroidism: Secondary | ICD-10-CM

## 2022-03-08 DIAGNOSIS — E781 Pure hyperglyceridemia: Secondary | ICD-10-CM | POA: Diagnosis not present

## 2022-03-08 DIAGNOSIS — F39 Unspecified mood [affective] disorder: Secondary | ICD-10-CM | POA: Diagnosis not present

## 2022-03-08 DIAGNOSIS — R5383 Other fatigue: Secondary | ICD-10-CM

## 2022-03-08 DIAGNOSIS — Z6841 Body Mass Index (BMI) 40.0 and over, adult: Secondary | ICD-10-CM

## 2022-03-08 DIAGNOSIS — R0602 Shortness of breath: Secondary | ICD-10-CM | POA: Diagnosis not present

## 2022-03-08 DIAGNOSIS — E559 Vitamin D deficiency, unspecified: Secondary | ICD-10-CM | POA: Diagnosis not present

## 2022-03-08 DIAGNOSIS — J302 Other seasonal allergic rhinitis: Secondary | ICD-10-CM

## 2022-03-08 DIAGNOSIS — Z9189 Other specified personal risk factors, not elsewhere classified: Secondary | ICD-10-CM

## 2022-03-08 DIAGNOSIS — E669 Obesity, unspecified: Secondary | ICD-10-CM

## 2022-03-09 ENCOUNTER — Ambulatory Visit: Payer: 59 | Admitting: Nurse Practitioner

## 2022-03-09 ENCOUNTER — Encounter: Payer: Self-pay | Admitting: Nurse Practitioner

## 2022-03-09 VITALS — BP 134/82 | HR 81 | Temp 98.8°F | Ht 64.0 in | Wt 254.4 lb

## 2022-03-09 DIAGNOSIS — K219 Gastro-esophageal reflux disease without esophagitis: Secondary | ICD-10-CM

## 2022-03-09 DIAGNOSIS — J4530 Mild persistent asthma, uncomplicated: Secondary | ICD-10-CM | POA: Diagnosis not present

## 2022-03-09 DIAGNOSIS — J4531 Mild persistent asthma with (acute) exacerbation: Secondary | ICD-10-CM

## 2022-03-09 DIAGNOSIS — R058 Other specified cough: Secondary | ICD-10-CM

## 2022-03-09 DIAGNOSIS — J302 Other seasonal allergic rhinitis: Secondary | ICD-10-CM | POA: Diagnosis not present

## 2022-03-09 LAB — COMPREHENSIVE METABOLIC PANEL
ALT: 19 IU/L (ref 0–32)
AST: 15 IU/L (ref 0–40)
Albumin/Globulin Ratio: 1.2 (ref 1.2–2.2)
Albumin: 4.1 g/dL (ref 3.8–4.8)
Alkaline Phosphatase: 67 IU/L (ref 44–121)
BUN/Creatinine Ratio: 12 (ref 9–23)
BUN: 8 mg/dL (ref 6–24)
Bilirubin Total: 0.2 mg/dL (ref 0.0–1.2)
CO2: 21 mmol/L (ref 20–29)
Calcium: 9.3 mg/dL (ref 8.7–10.2)
Chloride: 101 mmol/L (ref 96–106)
Creatinine, Ser: 0.69 mg/dL (ref 0.57–1.00)
Globulin, Total: 3.4 g/dL (ref 1.5–4.5)
Glucose: 93 mg/dL (ref 70–99)
Potassium: 4.4 mmol/L (ref 3.5–5.2)
Sodium: 138 mmol/L (ref 134–144)
Total Protein: 7.5 g/dL (ref 6.0–8.5)
eGFR: 106 mL/min/{1.73_m2} (ref >59)

## 2022-03-09 LAB — CBC WITH DIFFERENTIAL/PLATELET
Basophils Absolute: 0.1 10*3/uL (ref 0.0–0.2)
Basos: 1 %
EOS (ABSOLUTE): 0.1 10*3/uL (ref 0.0–0.4)
Eos: 1 %
Hematocrit: 43.5 % (ref 34.0–46.6)
Hemoglobin: 14.8 g/dL (ref 11.1–15.9)
Immature Grans (Abs): 0.1 10*3/uL (ref 0.0–0.1)
Immature Granulocytes: 1 %
Lymphocytes Absolute: 2.5 10*3/uL (ref 0.7–3.1)
Lymphs: 23 %
MCH: 33 pg (ref 26.6–33.0)
MCHC: 34 g/dL (ref 31.5–35.7)
MCV: 97 fL (ref 79–97)
Monocytes Absolute: 0.6 10*3/uL (ref 0.1–0.9)
Monocytes: 6 %
Neutrophils Absolute: 7.6 10*3/uL — ABNORMAL HIGH (ref 1.4–7.0)
Neutrophils: 68 %
Platelets: 374 10*3/uL (ref 150–450)
RBC: 4.49 x10E6/uL (ref 3.77–5.28)
RDW: 12.2 % (ref 11.7–15.4)
WBC: 11 10*3/uL — ABNORMAL HIGH (ref 3.4–10.8)

## 2022-03-09 LAB — LIPID PANEL
Chol/HDL Ratio: 3.5 ratio (ref 0.0–4.4)
Cholesterol, Total: 178 mg/dL (ref 100–199)
HDL: 51 mg/dL (ref >39)
LDL Chol Calc (NIH): 104 mg/dL — ABNORMAL HIGH (ref 0–99)
Triglycerides: 132 mg/dL (ref 0–149)
VLDL Cholesterol Cal: 23 mg/dL (ref 5–40)

## 2022-03-09 LAB — POCT EXHALED NITRIC OXIDE: FeNO level (ppb): 10

## 2022-03-09 LAB — INSULIN, RANDOM: INSULIN: 30.2 u[IU]/mL — ABNORMAL HIGH (ref 2.6–24.9)

## 2022-03-09 LAB — HEMOGLOBIN A1C
Est. average glucose Bld gHb Est-mCnc: 123 mg/dL
Hgb A1c MFr Bld: 5.9 % — ABNORMAL HIGH (ref 4.8–5.6)

## 2022-03-09 LAB — TSH: TSH: 3.08 u[IU]/mL (ref 0.450–4.500)

## 2022-03-09 LAB — VITAMIN B12: Vitamin B-12: 1031 pg/mL (ref 232–1245)

## 2022-03-09 LAB — T4, FREE: Free T4: 1.13 ng/dL (ref 0.82–1.77)

## 2022-03-09 LAB — FOLATE: Folate: 9.6 ng/mL (ref >3.0)

## 2022-03-09 LAB — VITAMIN D 25 HYDROXY (VIT D DEFICIENCY, FRACTURES): Vit D, 25-Hydroxy: 37 ng/mL (ref 30.0–100.0)

## 2022-03-09 MED ORDER — PREDNISONE 20 MG PO TABS: 20.0000 mg | ORAL_TABLET | Freq: Every day | ORAL | 0 refills | Status: AC

## 2022-03-09 MED ORDER — AZITHROMYCIN 250 MG PO TABS: ORAL_TABLET | ORAL | 0 refills | Status: DC

## 2022-03-09 NOTE — Assessment & Plan Note (Addendum)
Asthmatic bronchitis with postviral cough component. Slow to improve. Tx with prednisone taper for upper airway irritation and z pack. Cough control therapies. Continue ICS/LABA thearpy - change from Breo to Advair as previously prescribed. CBC with diff yesterday with eos 0.1. Will obtain allergen panel with IgE today for further evaluation of allergic triggers. FeNO nl today.  ? ?Patient Instructions  ?Start Advair 1 puff Twice daily. Brush tongue and rinse mouth afterwards  ?Continue Albuterol inhaler 2 puffs every 6 hours as needed for shortness of breath or wheezing. Notify if symptoms persist despite rescue inhaler/neb use. ?Continue Singulair (montelukast) 10 mg At bedtime  ?Continue omeprazole 40 mg daily  ?Continue Zyrtec 10 mg daily  ?Continue Robitussin as you have been  ? ?-Tessalon perles Three times a day as needed for cough  ?-Chlortab (chlorpheniramine) 4 mg over the counter At bedtime for cough  ?-Z pack (azithromycin). Take 2 tabs on day one (500 mg) followed by 1 tab (250 mg) daily for four additional days. Total 5 days. Take with food.  ?-Prednisone 20 mg for 5 days. Take in AM with food.  ? ?Frequent sips of water. Avoid throat clearing. Suck on sugar free hard candies or cough drops throughout the day ? ?Labs today - allergen panel and IgE ? ?Follow up in one month with Dr. Shearon Stalls. If symptoms do not improve or worsen, please contact office for sooner follow up or seek emergency care.  ? ? ?

## 2022-03-09 NOTE — Patient Instructions (Addendum)
Start Advair 1 puff Twice daily. Brush tongue and rinse mouth afterwards  ?Continue Albuterol inhaler 2 puffs every 6 hours as needed for shortness of breath or wheezing. Notify if symptoms persist despite rescue inhaler/neb use. ?Continue Singulair (montelukast) 10 mg At bedtime  ?Continue omeprazole 40 mg daily  ?Continue Zyrtec 10 mg daily  ?Continue Robitussin as you have been  ? ?-Tessalon perles Three times a day as needed for cough  ?-Chlortab (chlorpheniramine) 4 mg over the counter At bedtime for cough  ?-Z pack (azithromycin). Take 2 tabs on day one (500 mg) followed by 1 tab (250 mg) daily for four additional days. Total 5 days. Take with food.  ?-Prednisone 20 mg for 5 days. Take in AM with food.  ? ?Frequent sips of water. Avoid throat clearing. Suck on sugar free hard candies or cough drops throughout the day ? ?Labs today - allergen panel and IgE ? ?Follow up in one month with Dr. Celine Mans. If symptoms do not improve or worsen, please contact office for sooner follow up or seek emergency care.  ?

## 2022-03-09 NOTE — Assessment & Plan Note (Signed)
Persistent cough since URI earlier this month. Previous CXR obtained clear without evidence of superimposed infection. Given improvement in cough, will hold of on repeating imaging today. ?

## 2022-03-09 NOTE — Assessment & Plan Note (Signed)
Well-controlled on PPI regimen.  ?

## 2022-03-09 NOTE — Assessment & Plan Note (Signed)
Experiences seasonal allergies with associated cough. Suspected to be asthmatic/UACS. CBC with diff with very mild eosinophilia. Allergen panel today for further eval. Continue trigger prevention with Zyrtec and Singulair. ?

## 2022-03-09 NOTE — Progress Notes (Signed)
? ?@Patient  ID: Alicia Cox, female    DOB: 09/19/1971, 51 y.o.   MRN: [id number] ? ?Chief Complaint  ?Patient presents with  ? Follow-up  ?  Productive cough for about 6 months  ? ? ?Referring provider: ?Mosie Lukes, MD ? ?HPI: ?51 year old female, never smoker followed for chronic cough/bronchitis suspected to be related to asthma/UACS and allergic rhinitis. She is a patient of Dr. Mauricio Po and last seen in office on 02/06/2022. Past medical history significant for migraines, GERD, hypothyroidism, anxiety, obesity, vit D deficiency.  ? ?TEST/EVENTS:  ?02/06/2022 office spirometry: FVC 2.8 (78), FEV1 2.3 (81), ratio 82 ?02/22/2022 CXR 2 view: both lungs were clear. There was no evidence of acute process.  ? ?02/06/2022: OV with Dr. Shearon Stalls. Tx three times for bronchitis since September - first with amoxicillin with partial response, second with prednisone and abx with complete relief, and thrid with doxy and prednisone that also helped. Suspected cough was related to UACS and possible asthmatic component given allergy symptoms. Trial of ICS/LABA with Breo. Target GERD with omeprazole. Continue flonase, singulair and Zyrtec.  ? ?03/09/2022: Today - acute visit ?Patient presents today for persistent cough. She had contacted the office last week and notified that she had some sort of virus in the beginning of March and developed a worsening, productive cough. Her PCP obtained a chest x ray which was clear and she was prescribed a course of doxycycline for sinus infection. Today, she does feel like her cough is better than when she called in to set up this acute visit. At that time, she was having paroxysmal episodes with some post tussive vomiting. Now, she coughs occasionally throughout the day, usually a dry cough, and then has some coughing at night that is more productive, white to yellow. She hasn't noticed any significant wheezing since earlier in the month and her sinus symptoms have improved. She does have an  occasional sore throat but feels like it is from coughing so much. She denies any recent fevers, body aches or chills. She has gotten her Advair prescription but has yet to start using it as she had some questions about dosing. She continues on Zyrtec and Singulair. She has been taking Robitussin at night which has helped.  ? ?FeNO 10 ppb ? ?No Known Allergies ? ?Immunization History  ?Administered Date(s) Administered  ? Influenza,inj,Quad PF,6+ Mos 10/21/2013, 08/14/2014, 09/02/2015, 09/14/2016, 09/17/2017, 09/26/2019  ? Influenza-Unspecified 09/24/2018  ? PFIZER(Purple Top)SARS-COV-2 Vaccination 07/30/2020, 08/20/2020  ? Tdap 10/21/2013  ? ? ?Past Medical History:  ?Diagnosis Date  ? Acid reflux   ? Acute bronchitis 11/03/2013  ? Allergy   ? hay fever  ? Anxiety   ? Arthritis   ? Asthma   ? Back pain   ? Blood in stool   ? Cervical cancer screening 09/14/2016  ? Menarche at 13 Regular and heavy flow no history of abnormal pap in past G2P2, s/p 2 svd No history of abnormal MGM No concerns today  gyn surgeries: tubal and endometrial ablation LMP 09/13/2016  ? Chest pain 08/24/2013  ? Chicken pox as a child  ? Depression   ? Dermatitis 03/02/2016  ? Frequent headaches   ? Gallbladder problem   ? GERD (gastroesophageal reflux disease)   ? Hashimoto's disease   ? Heart murmur   ? History of blood clots   ? Hypothyroidism   ? Hashimoto's  ? IBS (irritable bowel syndrome)   ? Joint pain   ? Knee pain, left  01/09/2014  ? Low back pain 08/26/2017  ? Migraine   ? Obesity 11/15/2014  ? Preventative health care 09/12/2015  ? Rheumatoid arthritis ([place])   ? Sinus complaint   ? SOBOE (shortness of breath on exertion)   ? TOS (thoracic outlet syndrome) 08/25/2013  ? UTI (urinary tract infection)   ? Vitamin D deficiency   ? ? ?Tobacco History: ?Social History  ? ?Tobacco Use  ?Smoking Status Never  ?Smokeless Tobacco Never  ?Tobacco Comments  ? NEVER USED TOBACCO  ? ?Counseling given: Not Answered ?Tobacco comments: NEVER USED  TOBACCO ? ? ?Outpatient Medications Prior to Visit  ?Medication Sig Dispense Refill  ? albuterol (VENTOLIN HFA) 108 (90 Base) MCG/ACT inhaler Inhale 2 puffs into the lungs every 6 (six) hours as needed for wheezing or shortness of breath. 18 g 5  ? aspirin 81 MG EC tablet Take by mouth.    ? benzonatate (TESSALON) 200 MG capsule Take 1 capsule (200 mg total) by mouth 2 (two) times daily as needed for cough. 20 capsule 0  ? buPROPion (WELLBUTRIN XL) 150 MG 24 hr tablet Take 1 tablet by mouth twice daily 180 tablet 1  ? dicyclomine (BENTYL) 10 MG capsule Take 1 capsule (10 mg total) by mouth 3 (three) times daily as needed for spasms. 90 capsule 0  ? escitalopram (LEXAPRO) 10 MG tablet Take 1 tablet by mouth once daily 90 tablet 1  ? fluticasone-salmeterol (ADVAIR DISKUS) 250-50 MCG/ACT AEPB Inhale 1 puff into the lungs in the morning and at bedtime. 1 each 5  ? gabapentin (NEURONTIN) 300 MG capsule nightly 90 capsule 3  ? levothyroxine (SYNTHROID) 100 MCG tablet TAKE 1 TABLET BY MOUTH ONCE DAILY EXCEPT TAKE 1 & 1/2 (ONE & ONE-HALF) TABLETS ONCE DAILY ON THURSDAYS AND SUNDAYS 105 tablet 0  ? meloxicam (MOBIC) 7.5 MG tablet Take 1 tablet by mouth once daily 30 tablet 0  ? montelukast (SINGULAIR) 10 MG tablet Take 1 tablet (10 mg total) by mouth at bedtime. 90 tablet 3  ? omeprazole (PRILOSEC) 40 MG capsule Take 1 capsule (40 mg total) by mouth daily. 30 capsule 5  ? tiZANidine (ZANAFLEX) 4 MG tablet Take 0.5-1 tablets (2-4 mg total) by mouth every 8 (eight) hours as needed for muscle spasms. 40 tablet 2  ? ?No facility-administered medications prior to visit.  ? ? ? ?Review of Systems:  ? ?Constitutional: No weight loss or gain, night sweats, fevers, chills, fatigue, or lassitude. ?HEENT: No headaches, difficulty swallowing, tooth/dental problems. No sneezing, itching, ear ache, nasal congestion, or post nasal drip. +sore throat, occasional ?CV:  No chest pain, orthopnea, PND, swelling in lower extremities, anasarca,  dizziness, palpitations, syncope ?Resp: +cough, mostly dry, occasionally productive at night (improving). No shortness of breath with exertion or at rest. No hemoptysis. No wheezing.  No chest wall deformity ?GI:  No heartburn, indigestion, abdominal pain, nausea, vomiting, diarrhea, change in bowel habits, loss of appetite, bloody stools.  ?Skin: No rash, lesions, ulcerations ?MSK:  No joint pain or swelling.  No decreased range of motion.  No back pain. ?Neuro: No dizziness or lightheadedness.  ?Psych: No depression or anxiety. Mood stable.  ? ? ? ?Physical Exam: ? ?BP 134/82 (BP Location: Right Arm, Cuff Size: Large)   Pulse 81   Temp 98.8 ?F (37.1 ?C)   Ht 5\' 4"  (1.626 m)   Wt 254 lb 6.4 oz (115.4 kg)   SpO2 97%   BMI 43.67 kg/m?  ? ?GEN: Pleasant, interactive, well-appearing;  obese; in no acute distress. ?HEENT:  Normocephalic and atraumatic.  PERRLA. Sclera white. Nasal turbinates pink, moist and patent bilaterally. No rhinorrhea present. Oropharynx erythematous and moist, without exudate or edema. No lesions, ulcerations, or postnasal drip.  ?NECK:  Supple w/ fair ROM. No JVD present. Normal carotid impulses w/o bruits. Thyroid symmetrical with no goiter or nodules palpated. No lymphadenopathy.   ?CV: RRR, no m/r/g, no peripheral edema. Pulses intact, +2 bilaterally. No cyanosis, pallor or clubbing. ?PULMONARY:  Unlabored, regular breathing. Clear bilaterally A&P w/o wheezes/rales/rhonchi. No accessory muscle use. No dullness to percussion. ?GI: BS present and normoactive. Soft, non-tender to palpation. No organomegaly or masses detected. No CVA tenderness. ?MSK: No erythema, warmth or tenderness. Cap refil <2 sec all extrem. No deformities or joint swelling noted.  ?Neuro: A/Ox3. No focal deficits noted.   ?Skin: Warm, no lesions or rashe ?Psych: Normal affect and behavior. Judgement and thought content appropriate.  ? ? ? ?Lab Results: ? ?CBC ?   ?Component Value Date/Time  ? WBC 11.0 (H) 03/08/2022  1054  ? WBC 10.8 (H) 07/05/2021 1600  ? RBC 4.49 03/08/2022 1054  ? RBC 4.41 07/05/2021 1600  ? HGB 14.8 03/08/2022 1054  ? HGB 14.7 05/12/2015 0921  ? HCT 43.5 03/08/2022 1054  ? HCT 42.8 05/12/2015 0921  ? PL

## 2022-03-12 LAB — ALLERGEN PANEL (27) + IGE
Alternaria Alternata IgE: <0.1 kU/L
Aspergillus Fumigatus IgE: <0.1 kU/L
Bahia Grass IgE: <0.1 kU/L
Bermuda Grass IgE: <0.1 kU/L
Cat Dander IgE: <0.1 kU/L
Cedar, Mountain IgE: <0.1 kU/L
Cladosporium Herbarum IgE: <0.1 kU/L
Cocklebur IgE: <0.1 kU/L
Cockroach, American IgE: <0.1 kU/L
Common Silver Birch IgE: <0.1 kU/L
D Farinae IgE: 0.49 kU/L — AB
D Pteronyssinus IgE: 0.43 kU/L — AB
Dog Dander IgE: <0.1 kU/L
Elm, American IgE: <0.1 kU/L
Hickory, White IgE: <0.1 kU/L
IgE (Immunoglobulin E), Serum: 15 IU/mL (ref 6–495)
Johnson Grass IgE: <0.1 kU/L
Kentucky Bluegrass IgE: <0.1 kU/L
Maple/Box Elder IgE: <0.1 kU/L
Mucor Racemosus IgE: <0.1 kU/L
Oak, White IgE: <0.1 kU/L
Penicillium Chrysogen IgE: <0.1 kU/L
Pigweed, Rough IgE: <0.1 kU/L
Plantain, English IgE: <0.1 kU/L
Ragweed, Short IgE: <0.1 kU/L
Setomelanomma Rostrat: <0.1 kU/L
Timothy Grass IgE: <0.1 kU/L
White Mulberry IgE: <0.1 kU/L

## 2022-03-13 NOTE — Progress Notes (Signed)
Please notify pt IgE normal; allergen panel showed low positive response to dust mites. I don't think this is necessarily the cause of her recent cough; however, it can contribute to triggering her asthma. Continue Zyrtec daily and Singulair for trigger prevention. If allergy symptoms persist despite therapies, we can refer to allergist for further management. Thanks!

## 2022-03-14 NOTE — Progress Notes (Signed)
? ? ? ? ?Chief Complaint:  ? ?OBESITY ?Alicia Cox (MR# [id number]) is a 51 y.o. female who presents for evaluation and treatment of obesity and related comorbidities. Current BMI is Body mass index is 43.26 kg/m?Marland Kitchen Alicia Cox has been struggling with her weight for many years and has been unsuccessful in either losing weight, maintaining weight loss, or reaching her healthy weight goal. ? ?Alicia Cox's husband comes to our clinic  and he sees Dr. Manson Passey. Her PCP is Dr. Abner Greenspan. She works full time as a [employer] officer. Lives with her husband. She skips breakfast daily, and eats out 2-3 times per week. She craves potato chips, chocolate, cookies, pasta, and ice cream. ? ?Alicia Cox is currently in the action stage of change and ready to dedicate time achieving and maintaining a healthier weight. Alicia Cox is interested in becoming our patient and working on intensive lifestyle modifications including (but not limited to) diet and exercise for weight loss. ? ?Alicia Cox's habits were reviewed today and are as follows: Her family eats meals together, she thinks her family will eat healthier with her, her desired weight loss is 102 lbs, she has been heavy most of her life, she started gaining weight after getting pregnant, her heaviest weight ever was 275 pounds, she has significant food cravings issues, she snacks frequently in the evenings, she skips meals frequently, she frequently makes poor food choices, and she struggles with emotional eating. ? ?Depression Screen ?Alicia Cox's Food and Mood (modified PHQ-9) score was 16. ? ? ?  03/08/2022  ?  9:48 AM  ?Depression screen PHQ 2/9  ?Decreased Interest 2  ?Down, Depressed, Hopeless 3  ?PHQ - 2 Score 5  ?Altered sleeping 2  ?Tired, decreased energy 3  ?Change in appetite 2  ?Feeling bad or failure about yourself  2  ?Trouble concentrating 1  ?Moving slowly or fidgety/restless 1  ?Suicidal thoughts 0  ?PHQ-9 Score 16  ?Difficult doing work/chores Somewhat difficult  ? ?Subjective:   ? ?1. Other fatigue ?Alicia Cox admits to daytime somnolence and admits to waking up still tired. Patient has a history of symptoms of daytime fatigue, morning fatigue, and morning headache. Alicia Cox generally gets 7 hours of sleep per night, and states that she has nightime awakenings. Snoring is present. Apneic episodes are present. Epworth Sleepiness Score is 11. Alicia Cox's husband says she snores, especially since her recent bronchitis reactive airway disease flare-up. EKG-normal sinus rhythm with rate at 75 BPM. ? ?2. SOBOE (shortness of breath on exertion) ?Alicia Cox notes increasing shortness of breath with exercising and seems to be worsening over time with weight gain. She notes getting out of breath sooner with activity than she used to. This has not gotten worse recently. Alicia Cox denies shortness of breath at rest or orthopnea. ? ?3. Pure hypertriglyceridemia ?Alicia Cox has hypertriglyceridemia and she is not on medications. She has been trying to improve her cholesterol levels with intensive lifestyle modifications including a low saturated fat diet, exercise, and weight loss. She denies any chest pain, claudication, or myalgias. ? ?4. Other specified hypothyroidism ?Alicia Cox has a diagnosis of hypothyroidism, for 10+ years. She is taking Synthroid. ? ?5. Vitamin D deficiency ?Alicia Cox is currently taking OTC vitamin D 500 IU each day. She denies nausea, vomiting or muscle weakness. ? ?6. Seasonal allergies ?Alicia Cox has a newly diagnosis of reactive airway, which was diagnosed due to chronic bronchitis/cough. She has been on Zyrtec and Flonase, and now on Breo and Singulair. ? ?7. Mood disorder (HCC) with emotional eating ?Alicia Cox's medications are  managed per PCP. She is taking Lexapro and bupropion. She has no Veterinary surgeoncounselor. She has a history of depression and anxiety.  ? ?8. At risk for impaired metabolic function ?Alicia Cox is at increased risk for impaired metabolic function due to current nutrition and muscle  mass. ? ?Assessment/Plan:  ? ?Orders Placed This Encounter  ?Procedures  ? Vitamin B12  ? CBC with Differential/Platelet  ? Comprehensive metabolic panel  ? Folate  ? Hemoglobin A1c  ? Insulin, random  ? Lipid panel  ? T4, free  ? TSH  ? VITAMIN D 25 Hydroxy (Vit-D Deficiency, Fractures)  ? EKG 12-Lead  ? ? ?Medications Discontinued During This Encounter  ?Medication Reason  ? famotidine (PEPCID) 40 MG tablet   ?  ? ?No orders of the defined types were placed in this encounter. ?  ? ?1. Other fatigue ?Alicia Cox does feel that her weight is causing her energy to be lower than it should be. Fatigue may be related to obesity, depression or many other causes. Labs will be ordered, and in the meanwhile, Alicia Cox will focus on self care including making healthy food choices, increasing physical activity and focusing on stress reduction. ? ?- EKG 12-Lead ?- Vitamin B12 ?- CBC with Differential/Platelet ?- Folate ?- Comprehensive metabolic panel ? ?2. SOBOE (shortness of breath on exertion) ?Alicia Cox does feel that she gets out of breath more easily that she used to when she exercises. Alicia Cox's shortness of breath appears to be obesity related and exercise induced. She has agreed to work on weight loss and gradually increase exercise to treat her exercise induced shortness of breath. Will continue to monitor closely. ? ?3. Pure hypertriglyceridemia ?We will check labs today. Alicia Cox will continue to work on diet, exercise and weight loss efforts. Orders and follow up as documented in patient record.  ? ?Counseling ?Intensive lifestyle modifications are the first line treatment for this issue. ?Dietary changes: Increase soluble fiber. Decrease simple carbohydrates. ?Exercise changes: Moderate to vigorous-intensity aerobic activity 150 minutes per week if tolerated. ?Lipid-lowering medications: see documented in medical record. ? ?- Hemoglobin A1c ?- Insulin, random ?- Lipid panel ? ?4. Other specified hypothyroidism ?We will  check labs today. Orders and follow up as documented in patient record. ? ?Counseling ?Good thyroid control is important for overall health. Supratherapeutic thyroid levels are dangerous and will not improve weight loss results. ?Counseling: The correct way to take levothyroxine is fasting, with water, separated by at least 30 minutes from breakfast, and separated by more than 4 hours from calcium, iron, multivitamins, acid reflux medications (PPIs).  ? ?- T4, free ?- TSH ? ?5. Vitamin D deficiency ?We will check labs today. Alicia Cox will follow-up for routine testing of Vitamin D, at least 2-3 times per year to avoid over-replacement. ? ?- VITAMIN D 25 Hydroxy (Vit-D Deficiency, Fractures) ? ?6. Seasonal allergies ?Alicia Cox's symptoms are controlled currently, and she will continue her medications. ? ?7. Mood disorder (HCC) with emotional eating ?Alicia Cox's symptoms are controlled currently; will monitor to see if she needs additional emotional eating counseling. ? ?8. At risk for impaired metabolic function ?Alicia Cox was given approximately 24 minutes of impaired  metabolic function prevention counseling today. We discussed intensive lifestyle modifications today with an emphasis on specific nutrition and exercise instructions and strategies.  ? ?Repetitive spaced learning was employed today to elicit superior memory formation and behavioral change. ? ?9. Obesity with current BMI of 43.3 ?Alicia Cox is currently in the action stage of change and her goal is to continue  with weight loss efforts. I recommend Alicia Cox begin the structured treatment plan as follows: ? ?She has agreed to the Category 2 Plan. ? ?Exercise goals: As is.  ? ?Behavioral modification strategies: increasing lean protein intake, decreasing simple carbohydrates, meal planning and cooking strategies, and planning for success. ? ?She was informed of the importance of frequent follow-up visits to maximize her success with intensive lifestyle modifications  for her multiple health conditions. She was informed we would discuss her lab results at her next visit unless there is a critical issue that needs to be addressed sooner. Alicia Cox agreed to keep her next v

## 2022-03-15 ENCOUNTER — Telehealth: Payer: Self-pay | Admitting: Nurse Practitioner

## 2022-03-15 ENCOUNTER — Ambulatory Visit: Payer: 59 | Admitting: Family Medicine

## 2022-03-15 NOTE — Telephone Encounter (Signed)
Called and spoke to patient about test results. Patient had  a clear understanding. Nothing further needed.  ?

## 2022-03-22 ENCOUNTER — Encounter (INDEPENDENT_AMBULATORY_CARE_PROVIDER_SITE_OTHER): Payer: Self-pay | Admitting: Family Medicine

## 2022-03-22 ENCOUNTER — Ambulatory Visit (INDEPENDENT_AMBULATORY_CARE_PROVIDER_SITE_OTHER): Payer: 59 | Admitting: Family Medicine

## 2022-03-22 ENCOUNTER — Other Ambulatory Visit: Payer: Self-pay | Admitting: Family Medicine

## 2022-03-22 VITALS — BP 112/80 | HR 83 | Temp 97.8°F | Ht 64.0 in | Wt 246.0 lb

## 2022-03-22 DIAGNOSIS — E669 Obesity, unspecified: Secondary | ICD-10-CM

## 2022-03-22 DIAGNOSIS — R7303 Prediabetes: Secondary | ICD-10-CM

## 2022-03-22 DIAGNOSIS — Z6841 Body Mass Index (BMI) 40.0 and over, adult: Secondary | ICD-10-CM

## 2022-03-22 DIAGNOSIS — E559 Vitamin D deficiency, unspecified: Secondary | ICD-10-CM

## 2022-03-22 DIAGNOSIS — E782 Mixed hyperlipidemia: Secondary | ICD-10-CM

## 2022-03-22 DIAGNOSIS — Z9189 Other specified personal risk factors, not elsewhere classified: Secondary | ICD-10-CM

## 2022-03-22 DIAGNOSIS — E038 Other specified hypothyroidism: Secondary | ICD-10-CM

## 2022-03-22 MED ORDER — VITAMIN D (ERGOCALCIFEROL) 1.25 MG (50000 UNIT) PO CAPS
50000.0000 [IU] | ORAL_CAPSULE | ORAL | 0 refills | Status: DC
Start: 2022-03-22 — End: 2022-04-12

## 2022-03-25 DIAGNOSIS — R7303 Prediabetes: Secondary | ICD-10-CM | POA: Insufficient documentation

## 2022-03-27 NOTE — Progress Notes (Signed)
? ? ? ?Chief Complaint:  ? ?OBESITY ?Alicia Cox is here to discuss her progress with her obesity treatment plan along with follow-up of her obesity related diagnoses. Alicia Cox is on the Category 2 Plan and states she is following her eating plan approximately 100% of the time. Alicia Cox states she is not currently exercising. ? ?Today's visit was #: 2 ?Starting weight: 252 lbs ?Starting date: 03/09/2022 ?Today's weight: 246 lbs ?Today's date: 03/22/2022 ?Total lbs lost to date: 6 ?Total lbs lost since last in-office visit: 6 ? ?Interim History: Alicia Cox is here today for her first follow-up office visit since starting the program with [place].  All blood work/ lab tests that were recently ordered by myself or an outside provider were reviewed with patient today per their request.   Extended time was spent counseling her on all new disease processes that were discovered or preexisting ones that are affected by BMI.  she understands that many of these abnormalities will need to monitored regularly along with the current treatment plan of prudent dietary changes, in which we are making each and every office visit, to improve these health parameters. Alicia Cox states she feels the best she have felt in years, in terms of energy during the day. She lost almost 10 lbs in fat mass and gained 5 lbs in muscle mass. Her husband started with Alicia Cox on the same day. Helps to be on it together.  ? ?Subjective:  ? ?1. Hyperlipidemia, mixed ?New diagnosis. Alicia Cox is not on medications. In past, elevated triglycerides and LDL. Recent LDL 104, HDL 51, and triglycerides 132. I discussed labs with the patient today.  ? ?2. Other specified hypothyroidism ?Alicia Cox is tolerating medication(s) well without side effects.  Medication compliance is good as patient endorses taking it as prescribed. The patient denies additional concerns regarding this condition.  She takes Synthroid daily. I discussed labs with the patient  today. ? ?3. Pre-diabetes ?New diagnosis. Alicia Cox denies hunger or cravings on the meal plan. She is not on medications. I discussed labs with the patient today.  ? ?4. Vitamin D deficiency ?New diagnosis. Alicia Cox notes fatigue. She is not on medications, and her Vitamin D level is not at goal at 37.0. I discussed labs with the patient today.  ? ?5. At risk for deficient knowledge of diabetes mellitus ?Alicia Cox is at higher than average risk for deficient knowledge of diabetes mellitus due to her new onset of pre-diabetes.  ? ?Assessment/Plan:  ?No orders of the defined types were placed in this encounter. ? ? ?Medications Discontinued During This Encounter  ?Medication Reason  ? azithromycin (ZITHROMAX) 250 MG tablet Patient Preference  ?  ? ?Meds ordered this encounter  ?Medications  ? Vitamin D, Ergocalciferol, (DRISDOL) 1.25 MG (50000 UNIT) CAPS capsule  ?  Sig: Take 1 capsule (50,000 Units total) by mouth every 7 (seven) days.  ?  Dispense:  4 capsule  ?  Refill:  0  ?  30 d supply;  ** OV for RF **   Do not send RF request  ?  ? ?1. Hyperlipidemia, mixed ?Alicia Cox will continue her prudent nutritional plan with decreased sat/trans fats, decrease eating out and fast food, and eventually increase exercise.  ? ?2. Other specified hypothyroidism ?Labs at goal. Alicia Cox will continue medications per her PCP, and will continue to monitor.  ? ?3. Pre-diabetes ?Handouts were given after an extensive education was provided. No need for medications at this time but will consider in the future  if needed.  ? ?4. Vitamin D deficiency ?Alicia Cox agreed to start prescription Vitamin D weekly with no refills. ? ?- I discussed the importance of vitamin D to the patient's health and well-being.  ?- I reviewed possible symptoms of low Vitamin D:  low energy, depressed mood, muscle aches, joint aches, osteoporosis etc. was reviewed with patient ?- low Vitamin D levels may be linked to an increased risk of cardiovascular events and  even increased risk of cancers- such as colon and breast.  ?- ideal vitamin D levels reviewed with patient  ?- I recommend pt take a weekly prescription vit D - see script below   ?- Informed patient this may be a lifelong thing, and she was encouraged to continue to take the medicine until told otherwise.    ?- weight loss will likely improve availability of vitamin D, thus encouraged Alicia Cox to continue with meal plan and their weight loss efforts to further improve this condition.  Thus, we will need to monitor levels regularly (every 3-4 mo on average) to keep levels within normal limits and prevent over supplementation. ?- pt's questions and concerns regarding this condition addressed. ? ?- Vitamin D, Ergocalciferol, (DRISDOL) 1.25 MG (50000 UNIT) CAPS capsule; Take 1 capsule (50,000 Units total) by mouth every 7 (seven) days.  Dispense: 4 capsule; Refill: 0 ? ?5. At risk for deficient knowledge of diabetes mellitus ?Alicia Cox was given approximately 26 minutes of diabetes education and counseling today. We discussed intensive lifestyle modifications today with an emphasis on weight loss as well as increasing exercise and decreasing simple carbohydrates in her diet. We also reviewed medication with an emphasis on risk versus benefit of those discussed. ? ?6. Obesity with current BMI of 43.23 ?Alicia Cox is currently in the action stage of change. As such, her goal is to continue with weight loss efforts. She has agreed to the Category 2 Plan with breakfast options.  ? ?Gave holiday eating guide and eating out guide.  ? ?Exercise goals: As is. ? ?Behavioral modification strategies: increasing water intake, holiday eating strategies , and planning for success. ? ?Alicia Cox has agreed to follow-up with our clinic in 3 weeks. She was informed of the importance of frequent follow-up visits to maximize her success with intensive lifestyle modifications for her multiple health conditions.  ? ?Objective:  ? ?Blood pressure  112/80, pulse 83, temperature 97.8 ?F (36.6 ?C), height 5\' 4"  (1.626 m), weight 246 lb (111.6 kg), SpO2 97 %. ?Body mass index is 42.23 kg/m?. ? ?General: Cooperative, alert, well developed, in no acute distress. ?HEENT: Conjunctivae and lids unremarkable. ?Cardiovascular: Regular rhythm.  ?Lungs: Normal work of breathing. ?Neurologic: No focal deficits.  ? ?Lab Results  ?Component Value Date  ? CREATININE 0.69 03/08/2022  ? BUN 8 03/08/2022  ? NA 138 03/08/2022  ? K 4.4 03/08/2022  ? CL 101 03/08/2022  ? CO2 21 03/08/2022  ? ?Lab Results  ?Component Value Date  ? ALT 19 03/08/2022  ? AST 15 03/08/2022  ? ALKPHOS 67 03/08/2022  ? BILITOT 0.2 03/08/2022  ? ?Lab Results  ?Component Value Date  ? HGBA1C 5.9 (H) 03/08/2022  ? HGBA1C 6.3 07/05/2021  ? HGBA1C 5.3 03/18/2020  ? HGBA1C 5.8 09/24/2018  ? HGBA1C 5.6 09/17/2017  ? ?Lab Results  ?Component Value Date  ? INSULIN 30.2 (H) 03/08/2022  ? ?Lab Results  ?Component Value Date  ? TSH 3.080 03/08/2022  ? ?Lab Results  ?Component Value Date  ? CHOL 178 03/08/2022  ? HDL 51  03/08/2022  ? LDLCALC 104 (H) 03/08/2022  ? LDLDIRECT 104.0 09/17/2017  ? TRIG 132 03/08/2022  ? CHOLHDL 3.5 03/08/2022  ? ?Lab Results  ?Component Value Date  ? VD25OH 37.0 03/08/2022  ? VD25OH 42 08/22/2013  ? ?Lab Results  ?Component Value Date  ? WBC 11.0 (H) 03/08/2022  ? HGB 14.8 03/08/2022  ? HCT 43.5 03/08/2022  ? MCV 97 03/08/2022  ? PLT 374 03/08/2022  ? ?Lab Results  ?Component Value Date  ? IRON 147 (H) 02/06/2018  ? ?Attestation Statements:  ? ?Reviewed by clinician on day of visit: allergies, medications, problem list, medical history, surgical history, family history, social history, and previous encounter notes. ? ? ?I, Trixie Dredge, am acting as transcriptionist for [employer], DO. ? ?I have reviewed the above documentation for accuracy and completeness, and I agree with the above. Marjory Sneddon, D.O. ? ?The [place] was signed into law in 2016 which includes  the topic of electronic health records.  This provides immediate access to information in MyChart.  This includes consultation notes, operative notes, office notes, lab results and pathology reports.  If you have an

## 2022-04-03 ENCOUNTER — Other Ambulatory Visit: Payer: Self-pay | Admitting: Family Medicine

## 2022-04-03 DIAGNOSIS — E039 Hypothyroidism, unspecified: Secondary | ICD-10-CM

## 2022-04-07 ENCOUNTER — Encounter: Payer: Self-pay | Admitting: Family Medicine

## 2022-04-07 ENCOUNTER — Other Ambulatory Visit: Payer: Self-pay | Admitting: Family Medicine

## 2022-04-07 MED ORDER — ESCITALOPRAM OXALATE 10 MG PO TABS
10.0000 mg | ORAL_TABLET | Freq: Every day | ORAL | 1 refills | Status: DC
Start: 2022-04-07 — End: 2023-04-12

## 2022-04-10 ENCOUNTER — Encounter: Payer: Self-pay | Admitting: Internal Medicine

## 2022-04-10 ENCOUNTER — Ambulatory Visit: Payer: 59 | Admitting: Internal Medicine

## 2022-04-10 VITALS — BP 126/82 | HR 80 | Ht 64.0 in | Wt 244.2 lb

## 2022-04-10 DIAGNOSIS — J4540 Moderate persistent asthma, uncomplicated: Secondary | ICD-10-CM

## 2022-04-10 NOTE — Patient Instructions (Addendum)
Please schedule follow up scheduled with myself in 6 months.  If my schedule is not open yet, we will contact you with a reminder closer to that time. Please call [phone] if you haven't heard from [place] a month before.  ? ?Continue advair. Keep albuterol on an as needed basis.  ? ?Congratulations on taking good care of yourself and prioritizing your health! Keep up the good work.  ? ?Call me if you have issues with your breathing before follow up.  ?

## 2022-04-10 NOTE — Progress Notes (Signed)
? ?      ?Alicia Cox    [id number]    1971/06/27 ? ?Primary Care Physician:Blyth, Bryon Lions, MD ?Date of Appointment: 04/10/2022 ?Established Patient Visit ? ?Chief complaint:   ?Chief Complaint  ?Patient presents with  ? Follow-up  ?  6wk f/u for cough. States that the cough is now gone. Denies any increased SOB or chest tightness since last visit.   ? ? ? ?HPI: ?Alicia Cox is a 51 y.o. woman with asthma.  ? ?Interval Updates: ?Here for follow up after starting advair. Her cough is essentially resolved. She had allergy testing which showed IgE low and no eosinophilia.  ? ?Taking advair twice a day. No thrush. No albuterol inhaler.  ? ?[facility]. Doing this with her husband. She has already lost 8 lbs and has improved energy level.  ? ?I have reviewed the patient's family social and past medical history and updated as appropriate.  ? ?Past Medical History:  ?Diagnosis Date  ? Acid reflux   ? Acute bronchitis 11/03/2013  ? Allergy   ? hay fever  ? Anxiety   ? Arthritis   ? Asthma   ? Back pain   ? Blood in stool   ? Cervical cancer screening 09/14/2016  ? Menarche at 13 Regular and heavy flow no history of abnormal pap in past G2P2, s/p 2 svd No history of abnormal MGM No concerns today  gyn surgeries: tubal and endometrial ablation LMP 09/13/2016  ? Chest pain 08/24/2013  ? Chicken pox as a child  ? Depression   ? Dermatitis 03/02/2016  ? Frequent headaches   ? Gallbladder problem   ? GERD (gastroesophageal reflux disease)   ? Hashimoto's disease   ? Heart murmur   ? History of blood clots   ? Hypothyroidism   ? Hashimoto's  ? IBS (irritable bowel syndrome)   ? Joint pain   ? Knee pain, left 01/09/2014  ? Low back pain 08/26/2017  ? Migraine   ? Obesity 11/15/2014  ? Preventative health care 09/12/2015  ? Rheumatoid arthritis (HCC)   ? Sinus complaint   ? SOBOE (shortness of breath on exertion)   ? TOS (thoracic outlet syndrome) 08/25/2013  ? UTI (urinary tract infection)   ?  Vitamin D deficiency   ? ? ?Past Surgical History:  ?Procedure Laterality Date  ? ABLATION  2009  ? APPENDECTOMY  1994  ? CESAREAN SECTION  01/10/1993  ? CESAREAN SECTION  04/21/1998  ? CHOLECYSTECTOMY  2011  ? ENDOMETRIAL ABLATION    ? TONSILLECTOMY  1983  ? TUBAL LIGATION  1999  ? ? ?Family History  ?Problem Relation Age of Onset  ? Cancer Mother   ?     skin cancer  ? Hyperlipidemia Mother   ? Hypertension Mother   ? Skin cancer Mother   ? Diabetes Father   ?     type 2  ? Cancer Sister   ?     BCC  ? Hypertension Sister   ? Diabetes Sister   ? Asthma Daughter   ? Heart disease Maternal Grandmother   ? Diabetes Maternal Grandmother   ? Heart attack Maternal Grandfather   ? Diabetes Maternal Grandfather   ? Diabetes Paternal Grandmother   ? Diabetes Paternal Grandfather   ? ? ?Social History  ? ?Occupational History  ? Occupation: Runner, broadcasting/film/video at [employer]  ?  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 Smoking  status: Never  ? Smokeless tobacco: Never  ? Tobacco comments:  ?  NEVER USED TOBACCO  ?Vaping Use  ? Vaping Use: Never used  ?Substance and Sexual Activity  ? Alcohol use: No  ?  Alcohol/week: 0.0 standard drinks  ? Drug use: No  ? Sexual activity: Yes  ?  Partners: Male  ?  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 ? ? ?Physical Exam: ?Blood pressure 126/82, pulse 80, height 5\' 4"  (1.626 m), weight 244 lb 3.2 oz (110.8 kg), SpO2 99 %. ? ?Gen:      No acute distress ?ENT:  no nasal polyps, mucus membranes moist ?Lungs:    No increased respiratory effort, symmetric chest wall excursion, clear to auscultation bilaterally, no wheezes or crackles ?CV:         Regular rate and rhythm; no murmurs, rubs, or gallops.  No pedal edema ? ? ?Data Reviewed: ?Imaging: ?I have personally reviewed the chest xray March 2023 - no acute process ? ?PFTs: ? ?   ? View : No data to display.  ?  ?  ?  ? ?I have personally reviewed the patient's PFTs and normal spirometry ?FeNO 6  ppb ? ?Labs: ?Lab Results  ?Component Value Date  ? WBC 11.0 (H) 03/08/2022  ? HGB 14.8 03/08/2022  ? HCT 43.5 03/08/2022  ? MCV 97 03/08/2022  ? PLT 374 03/08/2022  ? ?Lab Results  ?Component Value Date  ? NA 138 03/08/2022  ? K 4.4 03/08/2022  ? CL 101 03/08/2022  ? CO2 21 03/08/2022  ? ? ?Immunization status: ?Immunization History  ?Administered Date(s) Administered  ? Influenza,inj,Quad PF,6+ Mos 10/21/2013, 08/14/2014, 09/02/2015, 09/14/2016, 09/17/2017, 09/26/2019  ? Influenza-Unspecified 09/24/2018  ? PFIZER(Purple Top)SARS-COV-2 Vaccination 07/30/2020, 08/20/2020  ? Tdap 10/21/2013  ? ? ?External Records Personally Reviewed:  ? ?Assessment:  ?Moderate persistent asthma, improved control ? ?Plan/Recommendations: ?Continue advair, prn albuterol ? ? ? ?Return to Care: ?Return in about 6 months (around 10/10/2022). ? ? ?10/12/2022, MD ?Pulmonary and Critical Care Medicine ?[facility] ?Office:[phone] ? ? ? ? ? ?

## 2022-04-12 ENCOUNTER — Encounter (INDEPENDENT_AMBULATORY_CARE_PROVIDER_SITE_OTHER): Payer: Self-pay | Admitting: Physician Assistant

## 2022-04-12 ENCOUNTER — Ambulatory Visit (INDEPENDENT_AMBULATORY_CARE_PROVIDER_SITE_OTHER): Payer: 59 | Admitting: Physician Assistant

## 2022-04-12 VITALS — BP 121/84 | HR 78 | Temp 98.2°F | Ht 64.0 in | Wt 242.0 lb

## 2022-04-12 DIAGNOSIS — Z9189 Other specified personal risk factors, not elsewhere classified: Secondary | ICD-10-CM

## 2022-04-12 DIAGNOSIS — E559 Vitamin D deficiency, unspecified: Secondary | ICD-10-CM | POA: Diagnosis not present

## 2022-04-12 DIAGNOSIS — Z6841 Body Mass Index (BMI) 40.0 and over, adult: Secondary | ICD-10-CM

## 2022-04-12 DIAGNOSIS — E669 Obesity, unspecified: Secondary | ICD-10-CM | POA: Diagnosis not present

## 2022-04-12 MED ORDER — VITAMIN D (ERGOCALCIFEROL) 1.25 MG (50000 UNIT) PO CAPS: 50000.0000 [IU] | ORAL_CAPSULE | ORAL | 0 refills | Status: DC

## 2022-04-17 NOTE — Progress Notes (Signed)
? ? ? ?Chief Complaint:  ? ?OBESITY ?Alicia Cox is here to discuss her progress with her obesity treatment plan along with follow-up of her obesity related diagnoses. Alicia Cox is on the Category 2 Plan with breakfast options and states she is following her eating plan approximately 98% of the time. Alicia Cox states she is doing 0 minutes 0 times per week. ? ?Today's visit was #: 3 ?Starting weight: 252 lbs ?Starting date: 03/09/2022 ?Today's weight: 242 lbs ?Today's date: 04/12/2022 ?Total lbs lost to date: 10 lbs ?Total lbs lost since last in-office visit: 4 lbs ? ?Interim History: Alicia Cox is doing well with wight loss. She would like more variety for lunch. She is sometimes hungry between meals.  ? ?Subjective:  ? ?1. Vitamin D deficiency ?Alicia Cox is on Vitamin D currently and she is taking as prescribed.  ? ?2. At risk for osteoporosis ?Alicia Cox is at higher risk of osteopenia and osteoporosis due to Vitamin D deficiency.   ? ?Assessment/Plan:  ? ?1. Vitamin D deficiency ?Low Vitamin D level contributes to fatigue and are associated with obesity, breast, and colon cancer. We will refill prescription Vitamin D 50,000 IU every week for 1 month with no refills and Alicia Cox will follow-up for routine testing of Vitamin D, at least 2-3 times per year to avoid over-replacement. ? ?- Vitamin D, Ergocalciferol, (DRISDOL) 1.25 MG (50000 UNIT) CAPS capsule; Take 1 capsule (50,000 Units total) by mouth every 7 (seven) days.  Dispense: 4 capsule; Refill: 0 ? ?2. At risk for osteoporosis ?Alicia Cox was given approximately 15 minutes of osteoporosis prevention counseling today. Alicia Cox is at risk for osteopenia and osteoporosis due to her Vitamin D deficiency. She was encouraged to take her Vit D and follow her higher calcium diet and increase strengthening exercise to help strengthen her bones and decrease her risk of osteopenia and osteoporosis.  ? ?3. Obesity with current BMI of 43.23 ?Alicia Cox is currently in the action stage of  change. As such, her goal is to continue with weight loss efforts. She has agreed to the Category 2 Plan plus Alicia Cox yogurt.  ? ?Alicia Cox will consider increasing to Category 3 if she continues to by hungry.  ? ?Exercise goals: No exercise has been prescribed at this time. ? ?Behavioral modification strategies: meal planning and cooking strategies and keeping healthy foods in the home. ? ?Alicia Cox has agreed to follow-up with our clinic in 2 weeks. She was informed of the importance of frequent follow-up visits to maximize her success with intensive lifestyle modifications for her multiple health conditions.  ? ?Objective:  ? ?Blood pressure 121/84, pulse 78, temperature 98.2 ?F (36.8 ?C), height 5\' 4"  (1.626 m), weight 242 lb (109.8 kg), SpO2 98 %. ?Body mass index is 41.54 kg/m?. ? ?General: Cooperative, alert, well developed, in no acute distress. ?HEENT: Conjunctivae and lids unremarkable. ?Cardiovascular: Regular rhythm.  ?Lungs: Normal work of breathing. ?Neurologic: No focal deficits.  ? ?Lab Results  ?Component Value Date  ? CREATININE 0.69 03/08/2022  ? BUN 8 03/08/2022  ? NA 138 03/08/2022  ? K 4.4 03/08/2022  ? CL 101 03/08/2022  ? CO2 21 03/08/2022  ? ?Lab Results  ?Component Value Date  ? ALT 19 03/08/2022  ? AST 15 03/08/2022  ? ALKPHOS 67 03/08/2022  ? BILITOT 0.2 03/08/2022  ? ?Lab Results  ?Component Value Date  ? HGBA1C 5.9 (H) 03/08/2022  ? HGBA1C 6.3 07/05/2021  ? HGBA1C 5.3 03/18/2020  ? HGBA1C 5.8 09/24/2018  ? HGBA1C 5.6 09/17/2017  ? ?Lab  Results  ?Component Value Date  ? INSULIN 30.2 (H) 03/08/2022  ? ?Lab Results  ?Component Value Date  ? TSH 3.080 03/08/2022  ? ?Lab Results  ?Component Value Date  ? CHOL 178 03/08/2022  ? HDL 51 03/08/2022  ? LDLCALC 104 (H) 03/08/2022  ? LDLDIRECT 104.0 09/17/2017  ? TRIG 132 03/08/2022  ? CHOLHDL 3.5 03/08/2022  ? ?Lab Results  ?Component Value Date  ? VD25OH 37.0 03/08/2022  ? VD25OH 42 08/22/2013  ? ?Lab Results  ?Component Value Date  ? WBC 11.0 (H)  03/08/2022  ? HGB 14.8 03/08/2022  ? HCT 43.5 03/08/2022  ? MCV 97 03/08/2022  ? PLT 374 03/08/2022  ? ?Lab Results  ?Component Value Date  ? IRON 147 (H) 02/06/2018  ? ?Attestation Statements:  ? ?Reviewed by clinician on day of visit: allergies, medications, problem list, medical history, surgical history, family history, social history, and previous encounter notes. ? ?I, Sindy Messing, am acting as Energy manager for [employer], PA-C. ? ?I have reviewed the above documentation for accuracy and completeness, and I agree with the above. Alois Cliche, PA-C ? ?

## 2022-04-26 ENCOUNTER — Encounter (INDEPENDENT_AMBULATORY_CARE_PROVIDER_SITE_OTHER): Payer: Self-pay | Admitting: Family Medicine

## 2022-04-26 ENCOUNTER — Ambulatory Visit (INDEPENDENT_AMBULATORY_CARE_PROVIDER_SITE_OTHER): Payer: 59 | Admitting: Family Medicine

## 2022-04-26 VITALS — BP 120/86 | HR 74 | Temp 97.8°F | Ht 64.0 in | Wt 238.0 lb

## 2022-04-26 DIAGNOSIS — R7303 Prediabetes: Secondary | ICD-10-CM | POA: Diagnosis not present

## 2022-04-26 DIAGNOSIS — E559 Vitamin D deficiency, unspecified: Secondary | ICD-10-CM

## 2022-04-26 DIAGNOSIS — E669 Obesity, unspecified: Secondary | ICD-10-CM | POA: Diagnosis not present

## 2022-04-26 DIAGNOSIS — Z6841 Body Mass Index (BMI) 40.0 and over, adult: Secondary | ICD-10-CM

## 2022-04-26 MED ORDER — VITAMIN D (ERGOCALCIFEROL) 1.25 MG (50000 UNIT) PO CAPS: 50000.0000 [IU] | ORAL_CAPSULE | ORAL | 0 refills | Status: DC

## 2022-05-05 NOTE — Progress Notes (Signed)
Chief Complaint:   OBESITY Alicia Cox is here to discuss her progress with her obesity treatment plan along with follow-up of her obesity related diagnoses. Ariam is on the Category 2 Plan with greek yogurt and states she is following her eating plan approximately 98% of the time. Alicia Cox states she has not been exercising.  Today's visit was #: 4 Starting weight: 252 lbs Starting date: 03/11/2022 Today's weight: 238 lbs Today's date: 04/26/2022 Total lbs lost to date: 14 Total lbs lost since last in-office visit: 4  Interim History: Alicia Cox is here for a follow up office visit. We reviewed her meal plan and all questions were answered.  Patient's food recall appears to be accurate and consistent with what is on plan when she is following it. When eating on plan, her hunger and cravings are well controlled.    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 She has done great and continues to lose fat mass and she gained 0.4 pounds in muscle mass.  Subjective:   1. Pre-diabetes Ryver denies carb or sweet cravings even when in her face. She's been able to avoid eating them. Jeffery is not on medication.  2. Vitamin D deficiency She is currently taking prescription vitamin D 50,000 IU each week. She denies nausea, vomiting or muscle weakness.  Lab Results  Component Value Date   VD25OH 37.0 03/08/2022   VD25OH 42 08/22/2013   Assessment/Plan:  No orders of the defined types were placed in this encounter.   Medications Discontinued During This Encounter  Medication Reason   Vitamin D, Ergocalciferol, (DRISDOL) 1.25 MG (50000 UNIT) CAPS capsule Reorder     Meds ordered this encounter  Medications   Vitamin D, Ergocalciferol, (DRISDOL) 1.25 MG (50000 UNIT) CAPS capsule    Sig: Take 1 capsule (50,000 Units total) by mouth every 7 (seven) days.    Dispense:  4 capsule    Refill:  0    30 d supply;   ** OV for RF **   Do not send RF request     1. Pre-diabetes Alicia Cox will continue to work on weight loss, exercise, and decreasing simple carbohydrates to help decrease the risk of diabetes.   2. Vitamin D deficiency Low Vitamin D level contributes to fatigue and are associated with obesity, breast, and colon cancer. She agrees to continue to take prescription Vitamin D @50 ,000 IU every week and will follow-up for routine testing of Vitamin D, at least 2-3 times per year to avoid over-replacement.  - Vitamin D, Ergocalciferol, (DRISDOL) 1.25 MG (50000 UNIT) CAPS capsule; Take 1 capsule (50,000 Units total) by mouth every 7 (seven) days.  Dispense: 4 capsule; Refill: 0  3. Obesity, Current BMI 40.9 Etana is currently in the action stage of change. As such, her goal is to continue with weight loss efforts. She has agreed to the Category 2 Plan with lunch options.   Exercise goals:  As is.  Behavioral modification strategies: increasing lean protein intake, decreasing simple carbohydrates, increasing water intake, and planning for success.  Alicia Cox has agreed to follow-up with our clinic in 3 weeks. She was informed of the importance of frequent follow-up visits to maximize her success with intensive lifestyle modifications for her multiple health conditions.   Objective:   Blood pressure 120/86, pulse 74, temperature 97.8 F (36.6 C), height 5\' 4"  (1.626 m), weight 238 lb (108 kg), SpO2 98 %.  Body mass index is 40.85 kg/m.  General: Cooperative, alert, well developed, in no acute distress. HEENT: Conjunctivae and lids unremarkable. Cardiovascular: Regular rhythm.  Lungs: Normal work of breathing. Neurologic: No focal deficits.   Lab Results  Component Value Date   CREATININE 0.69 03/08/2022   BUN 8 03/08/2022   NA 138 03/08/2022   K 4.4 03/08/2022   CL 101 03/08/2022   CO2 21 03/08/2022   Lab Results  Component Value Date   ALT 19 03/08/2022   AST 15 03/08/2022    ALKPHOS 67 03/08/2022   BILITOT 0.2 03/08/2022   Lab Results  Component Value Date   HGBA1C 5.9 (H) 03/08/2022   HGBA1C 6.3 07/05/2021   HGBA1C 5.3 03/18/2020   HGBA1C 5.8 09/24/2018   HGBA1C 5.6 09/17/2017   Lab Results  Component Value Date   INSULIN 30.2 (H) 03/08/2022   Lab Results  Component Value Date   TSH 3.080 03/08/2022   Lab Results  Component Value Date   CHOL 178 03/08/2022   HDL 51 03/08/2022   LDLCALC 104 (H) 03/08/2022   LDLDIRECT 104.0 09/17/2017   TRIG 132 03/08/2022   CHOLHDL 3.5 03/08/2022   Lab Results  Component Value Date   VD25OH 37.0 03/08/2022   VD25OH 42 08/22/2013   Lab Results  Component Value Date   WBC 11.0 (H) 03/08/2022   HGB 14.8 03/08/2022   HCT 43.5 03/08/2022   MCV 97 03/08/2022   PLT 374 03/08/2022   Lab Results  Component Value Date   IRON 147 (H) 02/06/2018   Attestation Statements:   Reviewed by clinician on day of visit: allergies, medications, problem list, medical history, surgical history, family history, social history, and previous encounter notes.  IKirke Corin, CMA, am acting as transcriptionist for [employer], DO  I have reviewed the above documentation for accuracy and completeness, and I agree with the above. [name], D.O.  The 21st Century Cures Act was signed into law in 2016 which includes the topic of electronic health records.  This provides immediate access to information in MyChart.  This includes consultation notes, operative notes, office notes, lab results and pathology reports.  If you have any questions about what you read please let us know at your next visit so we can discuss your concerns and take corrective action if need be.  We are right here with you.

## 2022-05-09 ENCOUNTER — Other Ambulatory Visit (INDEPENDENT_AMBULATORY_CARE_PROVIDER_SITE_OTHER): Payer: Self-pay | Admitting: Physician Assistant

## 2022-05-09 ENCOUNTER — Other Ambulatory Visit: Payer: Self-pay | Admitting: Family Medicine

## 2022-05-09 DIAGNOSIS — E559 Vitamin D deficiency, unspecified: Secondary | ICD-10-CM

## 2022-05-10 ENCOUNTER — Ambulatory Visit (INDEPENDENT_AMBULATORY_CARE_PROVIDER_SITE_OTHER): Payer: 59 | Admitting: Family Medicine

## 2022-05-10 ENCOUNTER — Encounter (INDEPENDENT_AMBULATORY_CARE_PROVIDER_SITE_OTHER): Payer: Self-pay | Admitting: Family Medicine

## 2022-05-10 VITALS — BP 109/76 | HR 80 | Temp 97.4°F | Ht 64.0 in | Wt 235.0 lb

## 2022-05-10 DIAGNOSIS — R7303 Prediabetes: Secondary | ICD-10-CM | POA: Diagnosis not present

## 2022-05-10 DIAGNOSIS — E559 Vitamin D deficiency, unspecified: Secondary | ICD-10-CM | POA: Diagnosis not present

## 2022-05-10 DIAGNOSIS — Z6841 Body Mass Index (BMI) 40.0 and over, adult: Secondary | ICD-10-CM

## 2022-05-10 DIAGNOSIS — E669 Obesity, unspecified: Secondary | ICD-10-CM

## 2022-05-10 MED ORDER — VITAMIN D (ERGOCALCIFEROL) 1.25 MG (50000 UNIT) PO CAPS: 50000.0000 [IU] | ORAL_CAPSULE | ORAL | 0 refills | Status: DC

## 2022-05-11 ENCOUNTER — Encounter: Payer: Self-pay | Admitting: Family Medicine

## 2022-05-11 MED ORDER — BUPROPION HCL ER (XL) 300 MG PO TB24
300.0000 mg | ORAL_TABLET | Freq: Every day | ORAL | 1 refills | Status: DC
Start: 2022-05-11 — End: 2022-10-27

## 2022-05-19 NOTE — Progress Notes (Signed)
Chief Complaint:   OBESITY Alicia Cox is here to discuss her progress with her obesity treatment plan along with follow-up of her obesity related diagnoses. Alicia Cox is on the Category 2 Plan with lunch options and states she is following her eating plan approximately 98% of the time. Alicia Cox states she is not currently exercising.  Today's visit was #: 5 Starting weight: 252 lbs Starting date: 03/11/2022 Today's weight: 235 lbs Today's date: 05/10/2022 Total lbs lost to date: 17 Total lbs lost since last in-office visit: 3  Interim History: Alicia Cox is here for a follow up office visit. We reviewed her meal plan and all questions were answered. Patient's food recall appears to be accurate and consistent with what is on plan when she is following it. When eating on plan, her hunger and cravings are well controlled.    Subjective:   1. Pre-diabetes Alicia Cox reports occasional cravings in the late afternoon or early evening after dinner. She will ne out of school in the near future and will pay attention to her cravings more and let us know at next OV.  2. Vitamin D deficiency She is currently taking prescription vitamin D 50,000 IU each week. She denies nausea, vomiting or muscle weakness.  Lab Results  Component Value Date   VD25OH 37.0 03/08/2022   VD25OH 42 08/22/2013   Assessment/Plan:  No orders of the defined types were placed in this encounter.   Medications Discontinued During This Encounter  Medication Reason   Vitamin D, Ergocalciferol, (DRISDOL) 1.25 MG (50000 UNIT) CAPS capsule Reorder     Meds ordered this encounter  Medications   Vitamin D, Ergocalciferol, (DRISDOL) 1.25 MG (50000 UNIT) CAPS capsule    Sig: Take 1 capsule (50,000 Units total) by mouth every 7 (seven) days.    Dispense:  4 capsule    Refill:  0    30 d supply;  ** OV for RF **   Do not send RF request     1. Pre-diabetes Alicia Cox will continue to work on weight loss, exercise, and  decreasing simple carbohydrates to help decrease the risk of diabetes. Handout on Metformin given to pt today and discussion about use had with pt. She prefers to modify meals and make 2 wraps for lunch each with 2-3 oz protein and break it into 2 meals. Follow prudent nutritional plan and increase protein intake by extra greek yogurt everyday.  2. Vitamin D deficiency Low Vitamin D level contributes to fatigue and are associated with obesity, breast, and colon cancer. She agrees to continue to take prescription Vitamin D @50 ,000 IU every week and will follow-up for routine testing of Vitamin D, at least 2-3 times per year to avoid over-replacement.  Refill- Vitamin D, Ergocalciferol, (DRISDOL) 1.25 MG (50000 UNIT) CAPS capsule; Take 1 capsule (50,000 Units total) by mouth every 7 (seven) days.  Dispense: 4 capsule; Refill: 0  3. Obesity, Current BMI 40.4 Alicia Cox is currently in the action stage of change. As such, her goal is to continue with weight loss efforts. She has agreed to the Category 2 Plan with 1 extra greek yogurt and lunch options .   Exercise goals:  As is  Behavioral modification strategies: no skipping meals and better snacking choices.  Alicia Cox has agreed to follow-up with our clinic in 2-3 weeks. She was informed of the importance of frequent follow-up visits to maximize her success with intensive lifestyle modifications for her multiple health conditions.   Objective:   Blood  pressure 109/76, pulse 80, temperature (!) 97.4 F (36.3 C), height 5\' 4"  (1.626 m), weight 235 lb (106.6 kg), SpO2 95 %. Body mass index is 40.34 kg/m.  General: Cooperative, alert, well developed, in no acute distress. HEENT: Conjunctivae and lids unremarkable. Cardiovascular: Regular rhythm.  Lungs: Normal work of breathing. Neurologic: No focal deficits.   Lab Results  Component Value Date   CREATININE 0.69 03/08/2022   BUN 8 03/08/2022   NA 138 03/08/2022   K 4.4 03/08/2022   CL 101  03/08/2022   CO2 21 03/08/2022   Lab Results  Component Value Date   ALT 19 03/08/2022   AST 15 03/08/2022   ALKPHOS 67 03/08/2022   BILITOT 0.2 03/08/2022   Lab Results  Component Value Date   HGBA1C 5.9 (H) 03/08/2022   HGBA1C 6.3 07/05/2021   HGBA1C 5.3 03/18/2020   HGBA1C 5.8 09/24/2018   HGBA1C 5.6 09/17/2017   Lab Results  Component Value Date   INSULIN 30.2 (H) 03/08/2022   Lab Results  Component Value Date   TSH 3.080 03/08/2022   Lab Results  Component Value Date   CHOL 178 03/08/2022   HDL 51 03/08/2022   LDLCALC 104 (H) 03/08/2022   LDLDIRECT 104.0 09/17/2017   TRIG 132 03/08/2022   CHOLHDL 3.5 03/08/2022   Lab Results  Component Value Date   VD25OH 37.0 03/08/2022   VD25OH 42 08/22/2013   Lab Results  Component Value Date   WBC 11.0 (H) 03/08/2022   HGB 14.8 03/08/2022   HCT 43.5 03/08/2022   MCV 97 03/08/2022   PLT 374 03/08/2022   Lab Results  Component Value Date   IRON 147 (H) 02/06/2018   Attestation Statements:   Reviewed by clinician on day of visit: allergies, medications, problem list, medical history, surgical history, family history, social history, and previous encounter notes.  I, 02/08/2018, BS, CMA, am acting as transcriptionist for Kyung Rudd, DO.  I have reviewed the above documentation for accuracy and completeness, and I agree with the above. [employer], D.O.  The 21st Century Cures Act was signed into law in 2016 which includes the topic of electronic health records.  This provides immediate access to information in MyChart.  This includes consultation notes, operative notes, office notes, lab results and pathology reports.  If you have any questions about what you read please let 2017 know at your next visit so we can discuss your concerns and take corrective action if need be.  We are right here with you.

## 2022-05-23 ENCOUNTER — Encounter (INDEPENDENT_AMBULATORY_CARE_PROVIDER_SITE_OTHER): Payer: Self-pay | Admitting: Family Medicine

## 2022-05-23 ENCOUNTER — Telehealth (INDEPENDENT_AMBULATORY_CARE_PROVIDER_SITE_OTHER): Payer: 59 | Admitting: Family Medicine

## 2022-05-23 DIAGNOSIS — E669 Obesity, unspecified: Secondary | ICD-10-CM

## 2022-05-23 DIAGNOSIS — Z6841 Body Mass Index (BMI) 40.0 and over, adult: Secondary | ICD-10-CM | POA: Diagnosis not present

## 2022-05-23 DIAGNOSIS — E559 Vitamin D deficiency, unspecified: Secondary | ICD-10-CM

## 2022-05-29 NOTE — Progress Notes (Signed)
TeleHealth Visit:  Due to the COVID-19 pandemic, this visit was completed with telemedicine (audio/video) technology to reduce patient and provider exposure as well as to preserve personal protective equipment.   Alicia Cox has verbally consented to this TeleHealth visit. The patient is located at home, the provider is located at the [facility] office. The participants in this visit include the listed provider and patient and husband. The visit was conducted today via video visit.  Chief Complaint: OBESITY Alicia Cox is here to discuss her progress with her obesity treatment plan along with follow-up of her obesity related diagnoses. Alicia Cox is on the Category 2 Plan  and 1 extra greek yogurt and lunch options and states she is following her eating plan approximately 100% of the time. Alicia Cox states she is not exercising.  Today's visit was #: 6 Starting weight: 252 lbs Starting date: 03/08/2022  Interim History:  Alicia Cox and her husband are sick with GI bug.  Symptoms of vomiting, diarrhea, etc.  Alicia Cox is asymptomatic for now, but Alicia Cox had symptoms that hit him all of a sudden this morning.   Subjective:   1. Vitamin D deficiency Alicia Cox is tolerating medication(s) well without side effects.  Medication compliance is good as patient endorses taking it as prescribed.  The patient denies additional concerns regarding this condition.     Alicia Cox denies any side effects, her compliance is good.    Assessment/Plan:  No orders of the defined types were placed in this encounter.   There are no discontinued medications.   No orders of the defined types were placed in this encounter.    1. Vitamin D deficiency Low Vitamin D level contributes to fatigue and are associated with obesity, breast, and colon cancer. She agrees to continue to take prescription Vitamin D @50 ,000 IU every week and will follow-up for routine testing of Vitamin D, at least 2-3 times per year  to avoid over-replacement. No need for Vitamin D refill today.   2. Obesity, Current BMI 40.4 Alicia Cox is currently in the action stage of change. As such, her goal is to continue with weight loss efforts. She has agreed to the Category 2 Plan with lunch options and 1 extra greek yogurt.  Exercise goals:  As is.   Behavioral modification strategies: increasing water intake, keeping healthy foods in the home, and avoiding temptations.  Alicia Cox has agreed to follow-up with our clinic in 2-3 weeks. She was informed of the importance of frequent follow-up visits to maximize her success with intensive lifestyle modifications for her multiple health conditions.  Objective:   VITALS: Per patient if applicable, see vitals. GENERAL: Alert and in no acute distress. CARDIOPULMONARY: No increased WOB. Speaking in clear sentences.  PSYCH: Pleasant and cooperative. Speech normal rate and rhythm. Affect is appropriate. Insight and judgement are appropriate. Attention is focused, linear, and appropriate.  NEURO: Oriented as arrived to appointment on time with no prompting.   Lab Results  Component Value Date   CREATININE 0.69 03/08/2022   BUN 8 03/08/2022   NA 138 03/08/2022   K 4.4 03/08/2022   CL 101 03/08/2022   CO2 21 03/08/2022   Lab Results  Component Value Date   ALT 19 03/08/2022   AST 15 03/08/2022   ALKPHOS 67 03/08/2022   BILITOT 0.2 03/08/2022   Lab Results  Component Value Date   HGBA1C 5.9 (H) 03/08/2022   HGBA1C 6.3 07/05/2021   HGBA1C 5.3 03/18/2020   HGBA1C 5.8 09/24/2018  HGBA1C 5.6 09/17/2017   Lab Results  Component Value Date   INSULIN 30.2 (H) 03/08/2022   Lab Results  Component Value Date   TSH 3.080 03/08/2022   Lab Results  Component Value Date   CHOL 178 03/08/2022   HDL 51 03/08/2022   LDLCALC 104 (H) 03/08/2022   LDLDIRECT 104.0 09/17/2017   TRIG 132 03/08/2022   CHOLHDL 3.5 03/08/2022   Lab Results  Component Value Date   VD25OH 37.0  03/08/2022   VD25OH 42 08/22/2013   Lab Results  Component Value Date   WBC 11.0 (H) 03/08/2022   HGB 14.8 03/08/2022   HCT 43.5 03/08/2022   MCV 97 03/08/2022   PLT 374 03/08/2022   Lab Results  Component Value Date   IRON 147 (H) 02/06/2018    Attestation Statements:   Reviewed by clinician on day of visit: allergies, medications, problem list, medical history, surgical history, family history, social history, and previous encounter notes.  I, Davy Pique, RMA, am acting as Location manager for [employer]  I have reviewed the above documentation for accuracy and completeness, and I agree with the above. Marjory Sneddon, D.O.  The [place] was signed into law in 2016 which includes the topic of electronic health records.  This provides immediate access to information in MyChart.  This includes consultation notes, operative notes, office notes, lab results and pathology reports.  If you have any questions about what you read please let us know at your next visit so we can discuss your concerns and take corrective action if need be.  We are right here with you.

## 2022-06-06 ENCOUNTER — Encounter (INDEPENDENT_AMBULATORY_CARE_PROVIDER_SITE_OTHER): Payer: Self-pay | Admitting: Family Medicine

## 2022-06-06 ENCOUNTER — Other Ambulatory Visit: Payer: Self-pay | Admitting: Family Medicine

## 2022-06-06 ENCOUNTER — Ambulatory Visit (INDEPENDENT_AMBULATORY_CARE_PROVIDER_SITE_OTHER): Payer: 59 | Admitting: Family Medicine

## 2022-06-06 VITALS — BP 109/78 | HR 73 | Temp 98.0°F | Ht 64.0 in | Wt 226.0 lb

## 2022-06-06 DIAGNOSIS — M542 Cervicalgia: Secondary | ICD-10-CM

## 2022-06-06 DIAGNOSIS — E559 Vitamin D deficiency, unspecified: Secondary | ICD-10-CM | POA: Diagnosis not present

## 2022-06-06 DIAGNOSIS — E669 Obesity, unspecified: Secondary | ICD-10-CM | POA: Diagnosis not present

## 2022-06-06 DIAGNOSIS — Z6838 Body mass index (BMI) 38.0-38.9, adult: Secondary | ICD-10-CM | POA: Diagnosis not present

## 2022-06-06 DIAGNOSIS — M79622 Pain in left upper arm: Secondary | ICD-10-CM

## 2022-06-06 MED ORDER — VITAMIN D (ERGOCALCIFEROL) 1.25 MG (50000 UNIT) PO CAPS: 50000.0000 [IU] | ORAL_CAPSULE | ORAL | 0 refills | Status: DC

## 2022-06-12 NOTE — Progress Notes (Signed)
Chief Complaint:   OBESITY Alicia Cox is here to discuss her progress with her obesity treatment plan along with follow-up of her obesity related diagnoses. Alicia Cox is on the Category 2 Plan with 1 extra greek yogurt and lunch options and states she is following her eating plan approximately 100% of the time. Alicia Cox states she is not currently exercising.  Today's visit was #: 7 Starting weight: 252 lbs Starting date: 03/08/2022 Today's weight: 226 lbs Today's date: 06/06/2022 Total lbs lost to date: 26 Total lbs lost since last in-office visit: 9  Interim History: Alicia Cox is here for a follow up office visit. We reviewed her meal plan and all questions were answered. Patient's food recall appears to be accurate and consistent with what is on plan when she is following it. When eating on plan, her hunger and cravings are well controlled.   Alicia Cox craves apples now. She occasionally snacks on ice cream or Halo top ice cream but stays within snack calories.  Subjective:   1. Vitamin D deficiency She is currently taking prescription vitamin D 50,000 IU each week. She denies nausea, vomiting or muscle weakness.  Assessment/Plan:    Medications Discontinued During This Encounter  Medication Reason   Vitamin D, Ergocalciferol, (DRISDOL) 1.25 MG (50000 UNIT) CAPS capsule Reorder     Meds ordered this encounter  Medications   Vitamin D, Ergocalciferol, (DRISDOL) 1.25 MG (50000 UNIT) CAPS capsule    Sig: Take 1 capsule (50,000 Units total) by mouth every 7 (seven) days.    Dispense:  4 capsule    Refill:  0    30 d supply;  ** OV for RF **   Do not send RF request     1. Vitamin D deficiency Low Vitamin D level contributes to fatigue and are associated with obesity, breast, and colon cancer. She agrees to continue to take prescription Vitamin D @50 ,000 IU every week and will follow-up for routine testing of Vitamin D, at least 2-3 times per year to avoid  over-replacement.  Refill- Vitamin D, Ergocalciferol, (DRISDOL) 1.25 MG (50000 UNIT) CAPS capsule; Take 1 capsule (50,000 Units total) by mouth every 7 (seven) days.  Dispense: 4 capsule; Refill: 0  2. Obesity, Current BMI 38.9 Alicia Cox is currently in the action stage of change. As such, her goal is to continue with weight loss efforts. She has agreed to the Category 2 Plan with extra [place] yogurt and lunch options.  Pt will get labs done in the near future- 2 OV's from now because her next OV is scheduled at 12:30 PM  Exercise goals:  start 5 minutes of walking 5 days a week.  Behavioral modification strategies: increasing lean protein intake, decreasing simple carbohydrates, and planning for success.  Alicia Cox has agreed to follow-up with our clinic in 3 weeks. She was informed of the importance of frequent follow-up visits to maximize her success with intensive lifestyle modifications for her multiple health conditions.   Objective:   Blood pressure 109/78, pulse 73, temperature 98 F (36.7 C), height 5\' 4"  (1.626 m), weight 226 lb (102.5 kg), SpO2 97 %. Body mass index is 38.79 kg/m.  General: Cooperative, alert, well developed, in no acute distress. HEENT: Conjunctivae and lids unremarkable. Cardiovascular: Regular rhythm.  Lungs: Normal work of breathing. Neurologic: No focal deficits.   Lab Results  Component Value Date   CREATININE 0.69 03/08/2022   BUN 8 03/08/2022   NA 138 03/08/2022   K 4.4 03/08/2022  CL 101 03/08/2022   CO2 21 03/08/2022   Lab Results  Component Value Date   ALT 19 03/08/2022   AST 15 03/08/2022   ALKPHOS 67 03/08/2022   BILITOT 0.2 03/08/2022   Lab Results  Component Value Date   HGBA1C 5.9 (H) 03/08/2022   HGBA1C 6.3 07/05/2021   HGBA1C 5.3 03/18/2020   HGBA1C 5.8 09/24/2018   HGBA1C 5.6 09/17/2017   Lab Results  Component Value Date   INSULIN 30.2 (H) 03/08/2022   Lab Results  Component Value Date   TSH 3.080 03/08/2022    Lab Results  Component Value Date   CHOL 178 03/08/2022   HDL 51 03/08/2022   LDLCALC 104 (H) 03/08/2022   LDLDIRECT 104.0 09/17/2017   TRIG 132 03/08/2022   CHOLHDL 3.5 03/08/2022   Lab Results  Component Value Date   VD25OH 37.0 03/08/2022   VD25OH 42 08/22/2013   Lab Results  Component Value Date   WBC 11.0 (H) 03/08/2022   HGB 14.8 03/08/2022   HCT 43.5 03/08/2022   MCV 97 03/08/2022   PLT 374 03/08/2022   Lab Results  Component Value Date   IRON 147 (H) 02/06/2018    Attestation Statements:   Reviewed by clinician on day of visit: allergies, medications, problem list, medical history, surgical history, family history, social history, and previous encounter notes.  I, Kyung Rudd, BS, CMA, am acting as transcriptionist for [employer], DO.  I have reviewed the above documentation for accuracy and completeness, and I agree with the above. -   I have reviewed the above documentation for accuracy and completeness, and I agree with the above. [name], D.O.  The 21st Century Cures Act was signed into law in 2016 which includes the topic of electronic health records.  This provides immediate access to information in MyChart.  This includes consultation notes, operative notes, office notes, lab results and pathology reports.  If you have any questions about what you read please let us know at your next visit so we can discuss your concerns and take corrective action if need be.  We are right here with you.

## 2022-06-27 ENCOUNTER — Encounter (INDEPENDENT_AMBULATORY_CARE_PROVIDER_SITE_OTHER): Payer: Self-pay | Admitting: Family Medicine

## 2022-06-27 ENCOUNTER — Ambulatory Visit (INDEPENDENT_AMBULATORY_CARE_PROVIDER_SITE_OTHER): Payer: 59 | Admitting: Family Medicine

## 2022-06-27 VITALS — BP 110/75 | HR 76 | Temp 97.9°F | Ht 64.0 in | Wt 222.0 lb

## 2022-06-27 DIAGNOSIS — E559 Vitamin D deficiency, unspecified: Secondary | ICD-10-CM | POA: Diagnosis not present

## 2022-06-27 DIAGNOSIS — E669 Obesity, unspecified: Secondary | ICD-10-CM | POA: Diagnosis not present

## 2022-06-27 DIAGNOSIS — Z6838 Body mass index (BMI) 38.0-38.9, adult: Secondary | ICD-10-CM

## 2022-06-27 MED ORDER — VITAMIN D (ERGOCALCIFEROL) 1.25 MG (50000 UNIT) PO CAPS: 50000.0000 [IU] | ORAL_CAPSULE | ORAL | 0 refills | Status: DC

## 2022-07-03 NOTE — Progress Notes (Signed)
Chief Complaint:   OBESITY Alicia Cox is here to discuss her progress with her obesity treatment plan along with follow-up of her obesity related diagnoses. Alicia Cox is on the Category 2 Plan with extra greek yogurt and lunch options and states she is following her eating plan approximately 90% of the time. Alicia Cox states she is not exercising.   Today's visit was #: 8 Starting weight: 252 lbs Starting date: 03/08/2022 Today's weight: 222 lbs Today's date: 06/27/2022 Total lbs lost to date: 30 lbs Total lbs lost since last in-office visit: 4 lbs  Interim History: A couple of weeks ago Alicia Cox had a GI bug.  Her mom was in the hospital most of last week.  Alicia Cox occasionally skips foods but overall did excellent with stressors she has faced.  Brought lunch to hospital a couple of days and planned ahead.   Subjective:   1. Vitamin D deficiency She is currently taking prescription vitamin D 50,000 IU each week. She denies nausea, vomiting or muscle weakness.  Assessment/Plan:  No orders of the defined types were placed in this encounter.   Medications Discontinued During This Encounter  Medication Reason   Vitamin D, Ergocalciferol, (DRISDOL) 1.25 MG (50000 UNIT) CAPS capsule Reorder     Meds ordered this encounter  Medications   Vitamin D, Ergocalciferol, (DRISDOL) 1.25 MG (50000 UNIT) CAPS capsule    Sig: Take 1 capsule (50,000 Units total) by mouth every 7 (seven) days.    Dispense:  4 capsule    Refill:  0    30 d supply;  ** OV for RF **   Do not send RF request     1. Vitamin D deficiency Low Vitamin D level contributes to fatigue and are associated with obesity, breast, and colon cancer. She agrees to continue to take prescription Vitamin D @50 ,000 IU every week and will follow-up for routine testing of Vitamin D, at least 2-3 times per year to avoid over-replacement.   Refill - Vitamin D, Ergocalciferol, (DRISDOL) 1.25 MG (50000 UNIT) CAPS capsule; Take 1 capsule  (50,000 Units total) by mouth every 7 (seven) days.  Dispense: 4 capsule; Refill: 0  2. Obesity, Current BMI 38.1 Dining out handouts given.   Alicia Cox is currently in the action stage of change. As such, her goal is to continue with weight loss efforts. She has agreed to the Category 2 Plan with extra greek yogurt and lunch options.    Exercise goals:  As is.   Behavioral modification strategies: meal planning and cooking strategies and planning for success.  Alicia Cox has agreed to follow-up with our clinic in 3 weeks. She was informed of the importance of frequent follow-up visits to maximize her success with intensive lifestyle modifications for her multiple health conditions.   Objective:   Blood pressure 110/75, pulse 76, temperature 97.9 F (36.6 C), height 5\' 4"  (1.626 m), weight 222 lb (100.7 kg), SpO2 97 %. Body mass index is 38.11 kg/m.  General: Cooperative, alert, well developed, in no acute distress. HEENT: Conjunctivae and lids unremarkable. Cardiovascular: Regular rhythm.  Lungs: Normal work of breathing. Neurologic: No focal deficits.   Lab Results  Component Value Date   CREATININE 0.69 03/08/2022   BUN 8 03/08/2022   NA 138 03/08/2022   K 4.4 03/08/2022   CL 101 03/08/2022   CO2 21 03/08/2022   Lab Results  Component Value Date   ALT 19 03/08/2022   AST 15 03/08/2022   ALKPHOS 67 03/08/2022   BILITOT  0.2 03/08/2022   Lab Results  Component Value Date   HGBA1C 5.9 (H) 03/08/2022   HGBA1C 6.3 07/05/2021   HGBA1C 5.3 03/18/2020   HGBA1C 5.8 09/24/2018   HGBA1C 5.6 09/17/2017   Lab Results  Component Value Date   INSULIN 30.2 (H) 03/08/2022   Lab Results  Component Value Date   TSH 3.080 03/08/2022   Lab Results  Component Value Date   CHOL 178 03/08/2022   HDL 51 03/08/2022   LDLCALC 104 (H) 03/08/2022   LDLDIRECT 104.0 09/17/2017   TRIG 132 03/08/2022   CHOLHDL 3.5 03/08/2022   Lab Results  Component Value Date   VD25OH 37.0  03/08/2022   VD25OH 42 08/22/2013   Lab Results  Component Value Date   WBC 11.0 (H) 03/08/2022   HGB 14.8 03/08/2022   HCT 43.5 03/08/2022   MCV 97 03/08/2022   PLT 374 03/08/2022   Lab Results  Component Value Date   IRON 147 (H) 02/06/2018   Attestation Statements:   Reviewed by clinician on day of visit: allergies, medications, problem list, medical history, surgical history, family history, social history, and previous encounter notes.  I, Malcolm Metro, RMA, am acting as Energy manager for [employer], DO.  I have reviewed the above documentation for accuracy and completeness, and I agree with the above. [name], D.O.  The 21st Century Cures Act was signed into law in 2016 which includes the topic of electronic health records.  This provides immediate access to information in MyChart.  This includes consultation notes, operative notes, office notes, lab results and pathology reports.  If you have any questions about what you read please let us know at your next visit so we can discuss your concerns and take corrective action if need be.  We are right here with you.

## 2022-07-05 NOTE — Progress Notes (Deleted)
Subjective:    Patient ID: Alicia Cox, female    DOB: [birth year], 51 y.o.   MRN: [id number]  No chief complaint on file.   HPI Patient is in today for a physical.  Past Medical History:  Diagnosis Date   Acid reflux    Acute bronchitis 11/03/2013   Allergy    hay fever   Anxiety    Arthritis    Asthma    Back pain    Blood in stool    Cervical cancer screening 09/14/2016   Menarche at 13 Regular and heavy flow no history of abnormal pap in past G2P2, s/p 2 svd No history of abnormal MGM No concerns today  gyn surgeries: tubal and endometrial ablation LMP 09/13/2016   Chest pain 08/24/2013   Chicken pox as a child   Depression    Dermatitis 03/02/2016   Frequent headaches    Gallbladder problem    GERD (gastroesophageal reflux disease)    Hashimoto's disease    Heart murmur    History of blood clots    Hypothyroidism    Hashimoto's   IBS (irritable bowel syndrome)    Joint pain    Knee pain, left 01/09/2014   Low back pain 08/26/2017   Migraine    Obesity 11/15/2014   Preventative health care 09/12/2015   Rheumatoid arthritis ([place])    Sinus complaint    SOBOE (shortness of breath on exertion)    TOS (thoracic outlet syndrome) 08/25/2013   UTI (urinary tract infection)    Vitamin D deficiency     Past Surgical History:  Procedure Laterality Date   ABLATION  2009   [place]  01/10/1993   CESAREAN SECTION  04/21/1998   CHOLECYSTECTOMY  2011   ENDOMETRIAL ABLATION     TONSILLECTOMY  1983   TUBAL LIGATION  1999    Family History  Problem Relation Age of Onset   Cancer Mother        skin cancer   Hyperlipidemia Mother    Hypertension Mother    Skin cancer Mother    Diabetes Father        type 2   Cancer Sister        BCC   Hypertension Sister    Diabetes Sister    Asthma Daughter    Heart disease Maternal Grandmother    Diabetes Maternal Grandmother    Heart attack Maternal Grandfather    Diabetes Maternal  Grandfather    Diabetes Paternal Grandmother    Diabetes Paternal Grandfather     Social History   Socioeconomic History   Marital status: Married    Spouse name: Akeelah Seppala   Number of children: 2   Years of education: Not on file   Highest education level: Not on file  Occupational History   Occupation: Pharmacist, hospital at [employer]    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  Tobacco Use   Smoking status: Never   Smokeless tobacco: Never   Tobacco comments:    NEVER USED TOBACCO  Vaping Use   Vaping Use: Never used  Substance and Sexual Activity   Alcohol use: No    Alcohol/week: 0.0 standard drinks of alcohol   Drug use: No   Sexual activity: Yes    Partners: Male    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, no dietary restrictions, 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 18 g 5   aspirin 81 MG EC tablet Take by mouth.     benzonatate (TESSALON) 200 MG capsule Take 1 capsule (200 mg total) by mouth 2 (two) times daily as needed for cough. 20 capsule 0   buPROPion (WELLBUTRIN XL) 300 MG 24 hr tablet Take 1 tablet (300 mg total) by mouth daily. 90 tablet 1   dicyclomine (BENTYL) 10 MG capsule Take 1 capsule (10 mg total) by mouth 3 (three) times daily as needed for spasms. 90 capsule 0   escitalopram (LEXAPRO) 10 MG tablet Take 1 tablet (10 mg total) by mouth daily. 90 tablet 1   fluticasone-salmeterol (ADVAIR DISKUS) 250-50 MCG/ACT AEPB  Inhale 1 puff into the lungs in the morning and at bedtime. 1 each 5   gabapentin (NEURONTIN) 300 MG capsule nightly 90 capsule 3   levothyroxine (SYNTHROID) 100 MCG tablet TAKE 1 TABLET BY MOUTH ONCE DAILY. EXCECPT ON THURSDAYS AND SUNDAYS TAKE 1 AND 1/2 TABLETS DAILY. 105 tablet 0   meloxicam (MOBIC) 7.5 MG tablet Take 1 tablet by mouth once daily 30 tablet 0   montelukast (SINGULAIR) 10 MG tablet TAKE 1 TABLET BY MOUTH AT BEDTIME 90 tablet 1   omeprazole (PRILOSEC) 40 MG capsule Take 1 capsule (40 mg total) by mouth daily. 30 capsule 5   tiZANidine (ZANAFLEX) 4 MG tablet TAKE 1/2 TO 1 (ONE-HALF TO ONE) TABLET BY MOUTH EVERY 8 HOURS AS NEEDED FOR MUSCLE SPASM 40 tablet 0   Vitamin D, Ergocalciferol, (DRISDOL) 1.25 MG (50000 UNIT) CAPS capsule Take 1 capsule (50,000 Units total) by mouth every 7 (seven) days. 4 capsule 0   No facility-administered medications prior to visit.    No Known Allergies  ROS     Objective:    Physical Exam  There were no vitals taken for this visit. Wt Readings from Last 3 Encounters:  06/27/22 222 lb (100.7 kg)  06/06/22 226 lb (102.5 kg)  05/10/22 235 lb (106.6 kg)    Diabetic Foot Exam - Simple   No data filed    Lab Results  Component Value Date   WBC 11.0 (H) 03/08/2022   HGB 14.8 03/08/2022   HCT 43.5 03/08/2022   PLT 374 03/08/2022   GLUCOSE 93 03/08/2022   CHOL 178 03/08/2022   TRIG 132 03/08/2022   HDL 51 03/08/2022   LDLDIRECT 104.0 09/17/2017   LDLCALC 104 (H) 03/08/2022   ALT 19 03/08/2022   AST 15 03/08/2022   NA 138 03/08/2022   K 4.4 03/08/2022   CL 101 03/08/2022   CREATININE 0.69 03/08/2022   BUN 8 03/08/2022   CO2 21 03/08/2022   TSH 3.080 03/08/2022   HGBA1C 5.9 (H) 03/08/2022    Lab Results  Component Value Date   TSH 3.080 03/08/2022   Lab Results  Component Value Date   WBC 11.0 (H) 03/08/2022   HGB 14.8 03/08/2022   HCT 43.5 03/08/2022   MCV 97 03/08/2022  PLT 374 03/08/2022   Lab Results   Component Value Date   NA 138 03/08/2022   K 4.4 03/08/2022   CO2 21 03/08/2022   GLUCOSE 93 03/08/2022   BUN 8 03/08/2022   CREATININE 0.69 03/08/2022   BILITOT 0.2 03/08/2022   ALKPHOS 67 03/08/2022   AST 15 03/08/2022   ALT 19 03/08/2022   PROT 7.5 03/08/2022   ALBUMIN 4.1 03/08/2022   CALCIUM 9.3 03/08/2022   ANIONGAP 7 04/07/2020   EGFR 106 03/08/2022   GFR 96.62 07/05/2021   Lab Results  Component Value Date   CHOL 178 03/08/2022   Lab Results  Component Value Date   HDL 51 03/08/2022   Lab Results  Component Value Date   LDLCALC 104 (H) 03/08/2022   Lab Results  Component Value Date   TRIG 132 03/08/2022   Lab Results  Component Value Date   CHOLHDL 3.5 03/08/2022   Lab Results  Component Value Date   HGBA1C 5.9 (H) 03/08/2022       Assessment & Plan:   COLONOSCOPY: 03/13/2014 MAMMO: 10/01/2018 PAP: 07/05/2021 PSA: n/a DEXA: n/a   Problem List Items Addressed This Visit   None   I am having Alicia Cox maintain her dicyclomine, aspirin EC, gabapentin, omeprazole, albuterol, fluticasone-salmeterol, benzonatate, levothyroxine, escitalopram, montelukast, buPROPion, meloxicam, tiZANidine, and Vitamin D (Ergocalciferol).  No orders of the defined types were placed in this encounter.

## 2022-07-06 ENCOUNTER — Encounter: Payer: Self-pay | Admitting: Family Medicine

## 2022-07-06 ENCOUNTER — Ambulatory Visit (INDEPENDENT_AMBULATORY_CARE_PROVIDER_SITE_OTHER): Payer: 59 | Admitting: Family Medicine

## 2022-07-06 VITALS — BP 124/74 | HR 82 | Resp 20 | Ht 64.0 in | Wt 228.0 lb

## 2022-07-06 DIAGNOSIS — E781 Pure hyperglyceridemia: Secondary | ICD-10-CM | POA: Diagnosis not present

## 2022-07-06 DIAGNOSIS — Z1231 Encounter for screening mammogram for malignant neoplasm of breast: Secondary | ICD-10-CM | POA: Diagnosis not present

## 2022-07-06 DIAGNOSIS — Z0000 Encounter for general adult medical examination without abnormal findings: Secondary | ICD-10-CM | POA: Diagnosis not present

## 2022-07-06 DIAGNOSIS — R739 Hyperglycemia, unspecified: Secondary | ICD-10-CM

## 2022-07-06 DIAGNOSIS — E039 Hypothyroidism, unspecified: Secondary | ICD-10-CM

## 2022-07-06 DIAGNOSIS — E559 Vitamin D deficiency, unspecified: Secondary | ICD-10-CM

## 2022-07-06 DIAGNOSIS — Z6841 Body Mass Index (BMI) 40.0 and over, adult: Secondary | ICD-10-CM

## 2022-07-06 MED ORDER — DICYCLOMINE HCL 10 MG PO CAPS: 10.0000 mg | ORAL_CAPSULE | Freq: Three times a day (TID) | ORAL | 1 refills | Status: DC | PRN

## 2022-07-06 MED ORDER — LEVOTHYROXINE SODIUM 100 MCG PO TABS: ORAL_TABLET | ORAL | 1 refills | Status: DC

## 2022-07-06 NOTE — Patient Instructions (Addendum)
Shingrix is the new shingles shot, 2 shots over 2-6 months, confirm coverage with insurance and document, then can return here for shots with nurse appt or at pharmacy   EWG, environmental working group [place] tea do not drink   Preventive Care 16-50 Years Old, Female Preventive care refers to lifestyle choices and visits with your health care provider that can promote health and wellness. Preventive care visits are also called wellness exams. What can I expect for my preventive care visit? Counseling Your health care provider may ask you questions about your: Medical history, including: Past medical problems. Family medical history. Pregnancy history. Current health, including: Menstrual cycle. Method of birth control. Emotional well-being. Home life and relationship well-being. Sexual activity and sexual health. Lifestyle, including: Alcohol, nicotine or tobacco, and drug use. Access to firearms. Diet, exercise, and sleep habits. Work and work Statistician. Sunscreen use. Safety issues such as seatbelt and bike helmet use. Physical exam Your health care provider will check your: Height and weight. These may be used to calculate your BMI (body mass index). BMI is a measurement that tells if you are at a healthy weight. Waist circumference. This measures the distance around your waistline. This measurement also tells if you are at a healthy weight and may help predict your risk of certain diseases, such as type 2 diabetes and high blood pressure. Heart rate and blood pressure. Body temperature. Skin for abnormal spots. What immunizations do I need?  Vaccines are usually given at various ages, according to a schedule. Your health care provider will recommend vaccines for you based on your age, medical history, and lifestyle or other factors, such as travel or where you work. What tests do I need? Screening Your health care provider may  recommend screening tests for certain conditions. This may include: Lipid and cholesterol levels. Diabetes screening. This is done by checking your blood sugar (glucose) after you have not eaten for a while (fasting). Pelvic exam and Pap test. Hepatitis B test. Hepatitis C test. HIV (human immunodeficiency virus) test. STI (sexually transmitted infection) testing, if you are at risk. Lung cancer screening. Colorectal cancer screening. Mammogram. Talk with your health care provider about when you should start having regular mammograms. This may depend on whether you have a family history of breast cancer. BRCA-related cancer screening. This may be done if you have a family history of breast, ovarian, tubal, or peritoneal cancers. Bone density scan. This is done to screen for osteoporosis. Talk with your health care provider about your test results, treatment options, and if necessary, the need for more tests. Follow these instructions at home: Eating and drinking  Eat a diet that includes fresh fruits and vegetables, whole grains, lean protein, and low-fat dairy products. Take vitamin and mineral supplements as recommended by your health care provider. Do not drink alcohol if: Your health care provider tells you not to drink. You are pregnant, may be pregnant, or are planning to become pregnant. If you drink alcohol: Limit how much you have to 0-1 drink a day. Know how much alcohol is in your drink. In the [place] one drink equals one 12 oz bottle of beer (355 mL), one 5 oz glass of wine (148 mL), or one 1 oz glass of hard liquor (44 mL). Lifestyle Brush your teeth every morning and night with fluoride toothpaste. Floss one time each day. Exercise for at least 30 minutes 5 or more days each week. Do not use any products that  contain nicotine or tobacco. These products include cigarettes, chewing tobacco, and vaping devices, such as e-cigarettes. If you need help quitting, ask your health  care provider. Do not use drugs. If you are sexually active, practice safe sex. Use a condom or other form of protection to prevent STIs. If you do not wish to become pregnant, use a form of birth control. If you plan to become pregnant, see your health care provider for a prepregnancy visit. Take aspirin only as told by your health care provider. Make sure that you understand how much to take and what form to take. Work with your health care provider to find out whether it is safe and beneficial for you to take aspirin daily. Find healthy ways to manage stress, such as: Meditation, yoga, or listening to music. Journaling. Talking to a trusted person. Spending time with friends and family. Minimize exposure to UV radiation to reduce your risk of skin cancer. Safety Always wear your seat belt while driving or riding in a vehicle. Do not drive: If you have been drinking alcohol. Do not ride with someone who has been drinking. When you are tired or distracted. While texting. If you have been using any mind-altering substances or drugs. Wear a helmet and other protective equipment during sports activities. If you have firearms in your house, make sure you follow all gun safety procedures. Seek help if you have been physically or sexually abused. What's next? Visit your health care provider once a year for an annual wellness visit. Ask your health care provider how often you should have your eyes and teeth checked. Stay up to date on all vaccines. This information is not intended to replace advice given to you by your health care provider. Make sure you discuss any questions you have with your health care provider. Document Revised: 06/01/2021 Document Reviewed: 06/01/2021 Elsevier Patient Education  [place].

## 2022-07-06 NOTE — Progress Notes (Signed)
Subjective:   By signing my name below, I, Kellie Simmering, attest that this documentation has been prepared under the direction and in the presence of Mosie Lukes, MD 07/06/2022.   Patient ID: Alicia Cox, female    DOB: 02/10/1971, 51 y.o.   MRN: [id number]  Chief Complaint  Patient presents with   Annual Exam   HPI Patient is in today for a comprehensive physical exam.  She denies having any fever, ear pain, new muscle pain, joint pain, new moles, congestion, sinus pain, sore throat, chest pain, palpitations, wheezing, depression, nausea, vomitting, diarrhea, constipation, blood in stool, dysuria, frequency and hematuria at this time.   Levothyroxine: She is requesting a refill on her Levothyroxine 10 mcg.   Diarrhea: She reports having diarrhea a few weeks ago and suspects that she had a stomach virus. She states that she takes probiotics daily. She had also been taking Dicyclomine 10 mg to manage her symptoms. She is requesting a refill on this.   Family history: She states that she has a family history of diabetes. She reports that her mother is not in good health. Her mother has skin cancer, hypertension and hyperlipidemia. Her sister also had skin cancer but is now cancer-free.  Immunizations: She is interested in receiving shingles immunizations. She is due for a tetanus immunization in 2024.  Diet: She states that she stress eats. She reports that she has been managing her diet well and has been following a meal plan. She also states that she has been drinking an appropriate amount of water daily.   Past Medical History:  Diagnosis Date   Acid reflux    Acute bronchitis 11/03/2013   Allergy    hay fever   Anxiety    Arthritis    Asthma    Back pain    Blood in stool    Cervical cancer screening 09/14/2016   Menarche at 13 Regular and heavy flow no history of abnormal pap in past G2P2, s/p 2 svd No history of abnormal MGM No concerns today  gyn surgeries: tubal  and endometrial ablation LMP 09/13/2016   Chest pain 08/24/2013   Chicken pox as a child   Depression    Dermatitis 03/02/2016   Frequent headaches    Gallbladder problem    GERD (gastroesophageal reflux disease)    Hashimoto's disease    Heart murmur    History of blood clots    Hypothyroidism    Hashimoto's   IBS (irritable bowel syndrome)    Joint pain    Knee pain, left 01/09/2014   Low back pain 08/26/2017   Migraine    Obesity 11/15/2014   Preventative health care 09/12/2015   Rheumatoid arthritis ([place])    Sinus complaint    SOBOE (shortness of breath on exertion)    TOS (thoracic outlet syndrome) 08/25/2013   UTI (urinary tract infection)    Vitamin D deficiency     Past Surgical History:  Procedure Laterality Date   ABLATION  2009   APPENDECTOMY  1994   CESAREAN SECTION  01/10/1993   CESAREAN SECTION  04/21/1998   CHOLECYSTECTOMY  2011   ENDOMETRIAL ABLATION     TONSILLECTOMY  1983   TUBAL LIGATION  1999    Family History  Problem Relation Age of Onset   Cancer Mother        skin cancer   Hyperlipidemia Mother    Hypertension Mother    Skin cancer Mother    Diabetes Father  type 2   Cancer Sister        BCC   Hypertension Sister    Diabetes Sister    Asthma Daughter    Heart disease Maternal Grandmother    Diabetes Maternal Grandmother    Heart attack Maternal Grandfather    Diabetes Maternal Grandfather    Diabetes Paternal Grandmother    Diabetes Paternal Grandfather     Social History   Socioeconomic History   Marital status: Married    Spouse name: Alexanderia Gorby   Number of children: 2   Years of education: Not on file   Highest education level: Not on file  Occupational History   Occupation: Pharmacist, hospital at [employer]    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  Tobacco Use   Smoking status: Never   Smokeless tobacco: Never   Tobacco comments:    NEVER USED TOBACCO  Vaping Use   Vaping Use: Never used  Substance and Sexual  Activity   Alcohol use: No    Alcohol/week: 0.0 standard drinks of alcohol   Drug use: No   Sexual activity: Yes    Partners: Male    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, no dietary restrictions, 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 18 g 5   aspirin 81 MG EC tablet Take by mouth.     buPROPion (WELLBUTRIN XL) 300 MG 24 hr tablet Take 1 tablet (300 mg total) by mouth daily. 90 tablet 1   escitalopram (LEXAPRO) 10 MG tablet Take 1 tablet (10 mg total) by mouth daily. 90 tablet 1   fluticasone-salmeterol (ADVAIR DISKUS) 250-50 MCG/ACT AEPB Inhale 1 puff into the lungs in the morning and at bedtime. 1 each 5   gabapentin (NEURONTIN) 300 MG capsule nightly 90 capsule 3   meloxicam (MOBIC) 7.5 MG tablet Take 1 tablet by mouth once daily 30 tablet 0   montelukast (SINGULAIR) 10 MG tablet TAKE 1 TABLET BY MOUTH AT BEDTIME 90 tablet 1   omeprazole (PRILOSEC) 40 MG capsule Take 1 capsule (40 mg total) by mouth daily. 30 capsule 5   tiZANidine (ZANAFLEX) 4 MG tablet TAKE 1/2 TO 1 (ONE-HALF TO ONE) TABLET BY MOUTH EVERY 8 HOURS AS NEEDED FOR MUSCLE SPASM 40 tablet 0   Vitamin D, Ergocalciferol, (DRISDOL) 1.25 MG (50000 UNIT) CAPS capsule Take 1 capsule (50,000 Units total) by mouth every 7 (seven) days. 4 capsule 0   benzonatate (TESSALON) 200 MG capsule Take 1 capsule (200 mg total) by mouth 2 (two) times daily as needed for cough. 20 capsule 0    dicyclomine (BENTYL) 10 MG capsule Take 1 capsule (10 mg total) by mouth 3 (three) times daily as needed for spasms. 90 capsule 0   levothyroxine (SYNTHROID) 100 MCG tablet TAKE 1 TABLET BY MOUTH ONCE DAILY. EXCECPT ON THURSDAYS AND SUNDAYS TAKE 1 AND 1/2 TABLETS DAILY. 105 tablet 0  No facility-administered medications prior to visit.    No Known Allergies  Review of Systems  Constitutional:  Negative for chills and fever.  HENT:  Negative for congestion, ear pain and sore throat.   Respiratory:  Negative for cough, shortness of breath, wheezing and stridor.   Cardiovascular:  Negative for chest pain and palpitations.  Gastrointestinal:  Negative for abdominal pain, blood in stool, constipation, diarrhea, nausea and vomiting.  Genitourinary:  Negative for dysuria, frequency, hematuria and urgency.  Musculoskeletal:  Negative for joint pain and myalgias.  Skin:        (-) new moles  Psychiatric/Behavioral:  Negative for depression.        Objective:    Physical Exam Constitutional:      General: She is not in acute distress.    Appearance: Normal appearance. She is not ill-appearing.  HENT:     Head: Normocephalic.     Right Ear: Ear canal and external ear normal.     Left Ear: Ear canal and external ear normal.  Eyes:     Extraocular Movements: Extraocular movements intact.     Right eye: No nystagmus.     Left eye: No nystagmus.     Pupils: Pupils are equal, round, and reactive to light.  Neck:     Vascular: No carotid bruit.  Cardiovascular:     Rate and Rhythm: Normal rate and regular rhythm.     Pulses: Normal pulses.     Heart sounds: Normal heart sounds. No murmur heard.    No gallop.     Comments: (-) pedal edema Pulmonary:     Effort: Pulmonary effort is normal. No respiratory distress.     Breath sounds: Normal breath sounds. No wheezing or rales.  Abdominal:     General: Bowel sounds are normal.     Palpations: Abdomen is soft.     Tenderness: There is  no abdominal tenderness. There is no guarding.  Musculoskeletal:     Comments: Muscle strength 5/5 on upper and lower extemities  Lymphadenopathy:     Cervical: No cervical adenopathy.  Skin:    General: Skin is warm and dry.  Neurological:     Mental Status: She is alert and oriented to person, place, and time.     Deep Tendon Reflexes:     Reflex Scores:      Patellar reflexes are 2+ on the right side and 2+ on the left side. Psychiatric:        Mood and Affect: Mood normal.        Behavior: Behavior normal.        Judgment: Judgment normal.     BP 124/74 (BP Location: Left Arm, Patient Position: Sitting, Cuff Size: Normal)   Pulse 82   Resp 20   Ht 5' 4" (1.626 m)   Wt 228 lb (103.4 kg)   SpO2 97%   BMI 39.14 kg/m  Wt Readings from Last 3 Encounters:  07/06/22 228 lb (103.4 kg)  06/27/22 222 lb (100.7 kg)  06/06/22 226 lb (102.5 kg)    Diabetic Foot Exam - Simple   No data filed    Lab Results  Component Value Date   WBC 11.0 (H) 03/08/2022   HGB 14.8 03/08/2022   HCT 43.5 03/08/2022   PLT 374 03/08/2022   GLUCOSE 93 03/08/2022   CHOL 178 03/08/2022   TRIG 132 03/08/2022   HDL 51 03/08/2022   LDLDIRECT 104.0 09/17/2017   LDLCALC 104 (H) 03/08/2022  ALT 19 03/08/2022   AST 15 03/08/2022   NA 138 03/08/2022   K 4.4 03/08/2022   CL 101 03/08/2022   CREATININE 0.69 03/08/2022   BUN 8 03/08/2022   CO2 21 03/08/2022   TSH 3.080 03/08/2022   HGBA1C 5.9 (H) 03/08/2022    Lab Results  Component Value Date   TSH 3.080 03/08/2022   Lab Results  Component Value Date   WBC 11.0 (H) 03/08/2022   HGB 14.8 03/08/2022   HCT 43.5 03/08/2022   MCV 97 03/08/2022   PLT 374 03/08/2022   Lab Results  Component Value Date   NA 138 03/08/2022   K 4.4 03/08/2022   CO2 21 03/08/2022   GLUCOSE 93 03/08/2022   BUN 8 03/08/2022   CREATININE 0.69 03/08/2022   BILITOT 0.2 03/08/2022   ALKPHOS 67 03/08/2022   AST 15 03/08/2022   ALT 19 03/08/2022   PROT 7.5  03/08/2022   ALBUMIN 4.1 03/08/2022   CALCIUM 9.3 03/08/2022   ANIONGAP 7 04/07/2020   EGFR 106 03/08/2022   GFR 96.62 07/05/2021   Lab Results  Component Value Date   CHOL 178 03/08/2022   Lab Results  Component Value Date   HDL 51 03/08/2022   Lab Results  Component Value Date   LDLCALC 104 (H) 03/08/2022   Lab Results  Component Value Date   TRIG 132 03/08/2022   Lab Results  Component Value Date   CHOLHDL 3.5 03/08/2022   Lab Results  Component Value Date   HGBA1C 5.9 (H) 03/08/2022   Colonoscopy: Last completed on 06/02/2014. Normal results. Repeat in 2025.  Pap Smear: Last completed on 07/05/2021. Normal results. Repeat in 3-5 years.  Mammogram: Last completed on 10/01/2018. No mammographic evidence of malignancy. Due    Assessment & Plan:   Problem List Items Addressed This Visit     Hypothyroidism   Relevant Medications   levothyroxine (SYNTHROID) 100 MCG tablet   Hyperglycemia    hgba1c acceptable, minimize simple carbs. Increase exercise as tolerated.       Class 3 severe obesity with serious comorbidity and body mass index (BMI) of 40.0 to 44.9 in adult [facility])    consider DASH or MIND diet, she is following with [facility] now, decrease po intake and increase exercise as tolerated. Needs 7-8 hours of sleep nightly. Avoid trans fats, eat small, frequent meals every 4-5 hours with lean proteins, complex carbs and healthy fats. Minimize simple carbs, high fat foods and processed foods      Preventative health care - Primary    Patient encouraged to maintain heart healthy diet, regular exercise, adequate sleep. Consider daily probiotics. Take medications as prescribed. Labs reviewed and she will have labs soon at [facility]. Colonoscopy: Last completed on 06/02/2014. Normal results. Repeat in 2025.  Pap Smear: Last completed on 07/05/2021. Normal results. Repeat in 3-5 years.  Mammogram: Last completed on 10/01/2018. No mammographic evidence of malignancy. Due now, she  agrees to proceed      Pure hypertriglyceridemia    Encourage heart healthy diet such as MIND or DASH diet, increase exercise, avoid trans fats, simple carbohydrates and processed foods, consider a krill or fish or flaxseed oil cap daily.       Vitamin D deficiency    Supplement and monitor      Other Visit Diagnoses     Encounter for screening mammogram for malignant neoplasm of breast       Relevant Orders   MM 3D SCREEN BREAST  BILATERAL      Meds ordered this encounter  Medications   dicyclomine (BENTYL) 10 MG capsule    Sig: Take 1 capsule (10 mg total) by mouth 3 (three) times daily as needed for spasms.    Dispense:  90 capsule    Refill:  1   levothyroxine (SYNTHROID) 100 MCG tablet    Sig: TAKE 1 TABLET BY MOUTH ONCE DAILY. EXCECPT ON THURSDAYS AND SUNDAYS TAKE 1 AND 1/2 TABLETS DAILY.    Dispense:  105 tablet    Refill:  1   I, Penni Homans, MD, personally preformed the services described in this documentation.  All medical record entries made by the scribe were at my direction and in my presence.  I have reviewed the chart and discharge instructions (if applicable) and agree that the record reflects my personal performance and is accurate and complete. 07/06/2022.  I,Mohammed Iqbal,acting as a scribe for Penni Homans, MD.,have documented all relevant documentation on the behalf of Penni Homans, MD,as directed by  Penni Homans, MD while in the presence of Penni Homans, MD.  Penni Homans, MD

## 2022-07-07 NOTE — Assessment & Plan Note (Signed)
consider DASH or MIND diet, she is following with [facility] now, decrease po intake and increase exercise as tolerated. Needs 7-8 hours of sleep nightly. Avoid trans fats, eat small, frequent meals every 4-5 hours with lean proteins, complex carbs and healthy fats. Minimize simple carbs, high fat foods and processed foods

## 2022-07-07 NOTE — Assessment & Plan Note (Addendum)
Patient encouraged to maintain heart healthy diet, regular exercise, adequate sleep. Consider daily probiotics. Take medications as prescribed. Labs reviewed and she will have labs soon at [facility]. Colonoscopy: Last completed on 06/02/2014. Normal results. Repeat in 2025.  Pap Smear: Last completed on 07/05/2021. Normal results. Repeat in 3-5 years.  Mammogram: Last completed on 10/01/2018. No mammographic evidence of malignancy. Due now, she agrees to proceed

## 2022-07-07 NOTE — Assessment & Plan Note (Signed)
Supplement and monitor 

## 2022-07-07 NOTE — Assessment & Plan Note (Signed)
hgba1c acceptable, minimize simple carbs. Increase exercise as tolerated.  

## 2022-07-07 NOTE — Assessment & Plan Note (Signed)
Encourage heart healthy diet such as MIND or DASH diet, increase exercise, avoid trans fats, simple carbohydrates and processed foods, consider a krill or fish or flaxseed oil cap daily.  °

## 2022-07-17 ENCOUNTER — Telehealth (INDEPENDENT_AMBULATORY_CARE_PROVIDER_SITE_OTHER): Payer: 59 | Admitting: Family Medicine

## 2022-07-17 VITALS — BP 107/72 | HR 65 | Temp 97.9°F | Ht 64.0 in | Wt 218.0 lb

## 2022-07-17 DIAGNOSIS — E559 Vitamin D deficiency, unspecified: Secondary | ICD-10-CM

## 2022-07-17 DIAGNOSIS — E038 Other specified hypothyroidism: Secondary | ICD-10-CM

## 2022-07-17 DIAGNOSIS — R7303 Prediabetes: Secondary | ICD-10-CM

## 2022-07-17 DIAGNOSIS — E063 Autoimmune thyroiditis: Secondary | ICD-10-CM

## 2022-07-17 DIAGNOSIS — D72829 Elevated white blood cell count, unspecified: Secondary | ICD-10-CM | POA: Diagnosis not present

## 2022-07-17 DIAGNOSIS — E7849 Other hyperlipidemia: Secondary | ICD-10-CM | POA: Diagnosis not present

## 2022-07-17 DIAGNOSIS — Z6837 Body mass index (BMI) 37.0-37.9, adult: Secondary | ICD-10-CM

## 2022-07-17 DIAGNOSIS — E669 Obesity, unspecified: Secondary | ICD-10-CM

## 2022-07-17 DIAGNOSIS — E782 Mixed hyperlipidemia: Secondary | ICD-10-CM

## 2022-07-18 ENCOUNTER — Encounter (INDEPENDENT_AMBULATORY_CARE_PROVIDER_SITE_OTHER): Payer: Self-pay | Admitting: Family Medicine

## 2022-07-18 ENCOUNTER — Ambulatory Visit (HOSPITAL_BASED_OUTPATIENT_CLINIC_OR_DEPARTMENT_OTHER)
Admission: RE | Admit: 2022-07-18 | Discharge: 2022-07-18 | Disposition: A | Discharge status: Home / Self Care | Payer: 59 | Source: Ambulatory Visit | Attending: Family Medicine | Admitting: Family Medicine

## 2022-07-18 ENCOUNTER — Encounter (HOSPITAL_BASED_OUTPATIENT_CLINIC_OR_DEPARTMENT_OTHER): Payer: Self-pay

## 2022-07-18 DIAGNOSIS — Z1231 Encounter for screening mammogram for malignant neoplasm of breast: Secondary | ICD-10-CM | POA: Insufficient documentation

## 2022-07-18 LAB — HEMOGLOBIN A1C
Est. average glucose Bld gHb Est-mCnc: 108 mg/dL
Hgb A1c MFr Bld: 5.4 % (ref 4.8–5.6)

## 2022-07-18 LAB — TSH: TSH: 2.49 u[IU]/mL (ref 0.450–4.500)

## 2022-07-18 LAB — LIPID PANEL WITH LDL/HDL RATIO
Cholesterol, Total: 141 mg/dL (ref 100–199)
HDL: 34 mg/dL — ABNORMAL LOW (ref >39)
LDL Chol Calc (NIH): 85 mg/dL (ref 0–99)
LDL/HDL Ratio: 2.5 ratio (ref 0.0–3.2)
Triglycerides: 119 mg/dL (ref 0–149)
VLDL Cholesterol Cal: 22 mg/dL (ref 5–40)

## 2022-07-18 LAB — CBC WITH DIFFERENTIAL/PLATELET
Basophils Absolute: 0.1 10*3/uL (ref 0.0–0.2)
Basos: 1 %
EOS (ABSOLUTE): 0.2 10*3/uL (ref 0.0–0.4)
Eos: 2 %
Hematocrit: 44.8 % (ref 34.0–46.6)
Hemoglobin: 15.2 g/dL (ref 11.1–15.9)
Immature Grans (Abs): 0 10*3/uL (ref 0.0–0.1)
Immature Granulocytes: 0 %
Lymphocytes Absolute: 1.7 10*3/uL (ref 0.7–3.1)
Lymphs: 21 %
MCH: 33.7 pg — ABNORMAL HIGH (ref 26.6–33.0)
MCHC: 33.9 g/dL (ref 31.5–35.7)
MCV: 99 fL — ABNORMAL HIGH (ref 79–97)
Monocytes Absolute: 0.5 10*3/uL (ref 0.1–0.9)
Monocytes: 6 %
Neutrophils Absolute: 5.7 10*3/uL (ref 1.4–7.0)
Neutrophils: 70 %
Platelets: 318 10*3/uL (ref 150–450)
RBC: 4.51 x10E6/uL (ref 3.77–5.28)
RDW: 11.5 % — ABNORMAL LOW (ref 11.7–15.4)
WBC: 8.2 10*3/uL (ref 3.4–10.8)

## 2022-07-18 LAB — CMP14+EGFR
ALT: 18 IU/L (ref 0–32)
AST: 14 IU/L (ref 0–40)
Albumin/Globulin Ratio: 1.5 (ref 1.2–2.2)
Albumin: 4.4 g/dL (ref 3.9–4.9)
Alkaline Phosphatase: 58 IU/L (ref 44–121)
BUN/Creatinine Ratio: 23 (ref 9–23)
BUN: 17 mg/dL (ref 6–24)
Bilirubin Total: 0.2 mg/dL (ref 0.0–1.2)
CO2: 24 mmol/L (ref 20–29)
Calcium: 9.3 mg/dL (ref 8.7–10.2)
Chloride: 105 mmol/L (ref 96–106)
Creatinine, Ser: 0.75 mg/dL (ref 0.57–1.00)
Globulin, Total: 2.9 g/dL (ref 1.5–4.5)
Glucose: 108 mg/dL — ABNORMAL HIGH (ref 70–99)
Potassium: 4.5 mmol/L (ref 3.5–5.2)
Sodium: 142 mmol/L (ref 134–144)
Total Protein: 7.3 g/dL (ref 6.0–8.5)
eGFR: 97 mL/min/{1.73_m2} (ref >59)

## 2022-07-18 LAB — INSULIN, RANDOM: INSULIN: 16.5 u[IU]/mL (ref 2.6–24.9)

## 2022-07-18 LAB — VITAMIN D 25 HYDROXY (VIT D DEFICIENCY, FRACTURES): Vit D, 25-Hydroxy: 38.6 ng/mL (ref 30.0–100.0)

## 2022-07-25 NOTE — Progress Notes (Unsigned)
TeleHealth Visit:  Due to the COVID-19 pandemic, this visit was completed with telemedicine (audio/video) technology to reduce patient and provider exposure as well as to preserve personal protective equipment.   Alicia Cox has verbally consented to this TeleHealth visit. The patient is located at home, the provider is located at the [facility] office. The participants in this visit include the listed provider and patient. The visit was conducted today via MyChart video.   Chief Complaint: OBESITY Alicia Cox is here to discuss her progress with her obesity treatment plan along with follow-up of her obesity related diagnoses. Alicia Cox is on the Category 2 Plan and states she is following her eating plan approximately 98% of the time. Alicia Cox states she is doing 0 minutes 0 times per week.  Today's visit was #: 9 Starting weight: 252 lbs Starting date: 03/08/2022  Interim History: Alicia Cox continues to do well with weight loss.  Her hunger is mostly controlled except for when she is on her cycle.  She is doing well with following her category 2 plan.  She was changed to a virtual visit due to needing to be rescheduled from Dr. Raliegh Scarlet when she was out of the office.  Alicia Cox is down 4 pounds since her last office visit.  Subjective:   1. Vitamin D deficiency Alicia Cox is on vitamin D, with no side effects noted.  She is due to have labs rechecked.  2. Other hyperlipidemia Alicia Cox is working on decreasing cholesterol in her diet.  She is not on statin currently.  3. Prediabetes Alicia Cox is doing well with decreasing simple carbohydrates, and she notes decrease in polyphagia.  4. Leukocytosis, unspecified type Alicia Cox has a history of leukocytosis, and she is due for labs to monitor her progress.  5. Hypothyroidism due to Hashimoto's thyroiditis Alicia Cox is on Synthroid, with no side effects noted.  She is working on her weight loss which may cause a need to adjust her  dose.  Assessment/Plan:   1. Vitamin D deficiency We will check labs today. Alicia Cox will follow-up for routine testing of Vitamin D, at least 2-3 times per year to avoid over-replacement.  - VITAMIN D 25 Hydroxy (Vit-D Deficiency, Fractures)  2. Other hyperlipidemia We will check labs today. Alicia Cox will continue to work on diet, exercise and weight loss efforts. Orders and follow up as documented in patient record.   - Lipid Panel With LDL/HDL Ratio  3. Prediabetes We will check labs today. Alicia Cox will continue to work on weight loss, exercise, and decreasing simple carbohydrates to help decrease the risk of diabetes.   - CMP14+EGFR - Hemoglobin A1c - Insulin, random  4. Leukocytosis, unspecified type We will check labs today, and we will follow-up at Alicia Cox's next visit.  - CBC with Differential/Platelet  5. Hypothyroidism due to Hashimoto's thyroiditis We will check labs today, and we will follow-up at Alicia Cox's next visit. Orders and follow up as documented in patient record.  - TSH  6. Obesity, current BMI 37.4 Alicia Cox is currently in the action stage of change. As such, her goal is to continue with weight loss efforts. She has agreed to the Category 2 Plan.   Behavioral modification strategies: increasing lean protein intake and decreasing simple carbohydrates.  Alicia Cox has agreed to follow-up with our clinic in 3 weeks. She was informed of the importance of frequent follow-up visits to maximize her success with intensive lifestyle modifications for her multiple health conditions.  Alicia Cox was informed we would discuss her lab results at  her next visit unless there is a critical issue that needs to be addressed sooner. Alicia Cox agreed to keep her next visit at the agreed upon time to discuss these results.  Objective:   VITALS: Per patient if applicable, see vitals. GENERAL: Alert and in no acute distress. CARDIOPULMONARY: No increased WOB. Speaking in clear  sentences.  PSYCH: Pleasant and cooperative. Speech normal rate and rhythm. Affect is appropriate. Insight and judgement are appropriate. Attention is focused, linear, and appropriate.  NEURO: Oriented as arrived to appointment on time with no prompting.   Lab Results  Component Value Date   CREATININE 0.75 07/17/2022   BUN 17 07/17/2022   NA 142 07/17/2022   K 4.5 07/17/2022   CL 105 07/17/2022   CO2 24 07/17/2022   Lab Results  Component Value Date   ALT 18 07/17/2022   AST 14 07/17/2022   ALKPHOS 58 07/17/2022   BILITOT 0.2 07/17/2022   Lab Results  Component Value Date   HGBA1C 5.4 07/17/2022   HGBA1C 5.9 (H) 03/08/2022   HGBA1C 6.3 07/05/2021   HGBA1C 5.3 03/18/2020   HGBA1C 5.8 09/24/2018   Lab Results  Component Value Date   INSULIN 16.5 07/17/2022   INSULIN 30.2 (H) 03/08/2022   Lab Results  Component Value Date   TSH 2.490 07/17/2022   Lab Results  Component Value Date   CHOL 141 07/17/2022   HDL 34 (L) 07/17/2022   LDLCALC 85 07/17/2022   LDLDIRECT 104.0 09/17/2017   TRIG 119 07/17/2022   CHOLHDL 3.5 03/08/2022   Lab Results  Component Value Date   VD25OH 38.6 07/17/2022   VD25OH 37.0 03/08/2022   VD25OH 42 08/22/2013   Lab Results  Component Value Date   WBC 8.2 07/17/2022   HGB 15.2 07/17/2022   HCT 44.8 07/17/2022   MCV 99 (H) 07/17/2022   PLT 318 07/17/2022   Lab Results  Component Value Date   IRON 147 (H) 02/06/2018    Attestation Statements:   Reviewed by clinician on day of visit: allergies, medications, problem list, medical history, surgical history, family history, social history, and previous encounter notes.   I, Trixie Dredge, am acting as transcriptionist for Dennard Nip, MD.  I have reviewed the above documentation for accuracy and completeness, and I agree with the above. - Dennard Nip, MD

## 2022-07-26 ENCOUNTER — Encounter (INDEPENDENT_AMBULATORY_CARE_PROVIDER_SITE_OTHER): Payer: Self-pay

## 2022-07-31 ENCOUNTER — Encounter: Payer: Self-pay | Admitting: Family Medicine

## 2022-07-31 ENCOUNTER — Other Ambulatory Visit: Payer: Self-pay | Admitting: Family Medicine

## 2022-07-31 ENCOUNTER — Encounter: Payer: Self-pay | Admitting: Internal Medicine

## 2022-07-31 DIAGNOSIS — R053 Chronic cough: Secondary | ICD-10-CM

## 2022-07-31 DIAGNOSIS — J4530 Mild persistent asthma, uncomplicated: Secondary | ICD-10-CM

## 2022-07-31 MED ORDER — GABAPENTIN 300 MG PO CAPS: ORAL_CAPSULE | ORAL | 3 refills | Status: DC

## 2022-07-31 MED ORDER — OMEPRAZOLE 40 MG PO CPDR: 40.0000 mg | DELAYED_RELEASE_CAPSULE | Freq: Every day | ORAL | 5 refills | Status: DC

## 2022-08-02 ENCOUNTER — Ambulatory Visit (INDEPENDENT_AMBULATORY_CARE_PROVIDER_SITE_OTHER): Payer: 59 | Admitting: Family Medicine

## 2022-08-02 VITALS — BP 110/74 | HR 75 | Temp 97.6°F | Ht 64.0 in | Wt 214.0 lb

## 2022-08-02 DIAGNOSIS — Z6836 Body mass index (BMI) 36.0-36.9, adult: Secondary | ICD-10-CM

## 2022-08-02 DIAGNOSIS — E559 Vitamin D deficiency, unspecified: Secondary | ICD-10-CM

## 2022-08-02 DIAGNOSIS — R7303 Prediabetes: Secondary | ICD-10-CM | POA: Diagnosis not present

## 2022-08-02 DIAGNOSIS — E7849 Other hyperlipidemia: Secondary | ICD-10-CM | POA: Diagnosis not present

## 2022-08-02 DIAGNOSIS — E038 Other specified hypothyroidism: Secondary | ICD-10-CM

## 2022-08-02 DIAGNOSIS — E669 Obesity, unspecified: Secondary | ICD-10-CM

## 2022-08-02 MED ORDER — VITAMIN D (ERGOCALCIFEROL) 1.25 MG (50000 UNIT) PO CAPS: 50000.0000 [IU] | ORAL_CAPSULE | ORAL | 0 refills | Status: DC

## 2022-08-07 ENCOUNTER — Encounter (INDEPENDENT_AMBULATORY_CARE_PROVIDER_SITE_OTHER): Payer: Self-pay | Admitting: Family Medicine

## 2022-08-08 NOTE — Progress Notes (Signed)
Chief Complaint:   OBESITY Alicia Cox is here to discuss her progress with her obesity treatment plan along with follow-up of her obesity related diagnoses. Alicia Cox is on the Category 2 Plan with 6 oz of protein at lunch and states she is following her eating plan approximately 100% of the time. Alicia Cox states she is walking 5 minutes 2 times per week.  Today's visit was #: 10 Starting weight: 252 lbs Starting date: 03/08/2022 Today's weight: 214 lbs Today's date: 08/02/2022 Total lbs lost to date: 38 Total lbs lost since last in-office visit: 4  Interim History: Pt is occasionally hungry in the afternoons (rarely, but it does occur). When she has an apple or fruit, she is fine. Alicia Cox is eating 5 oz of protein at lunch.  Subjective:   1. Prediabetes Discussed labs with patient today. Alicia Cox declines meds/Metformin for her afternoon hunger. She definitely doesn't want shots either. Strategies discussed with pt to help with her symptoms. CMP with normal limits, except fasting blood sugar is 108.  2. Vitamin D deficiency Discussed labs with patient today. Alicia Cox is not taking supplement regularly (not weekly) and Vit D is not at goal.  3. Other hyperlipidemia Discussed labs with patient today. Discussed FLP, CMP, and CBC with pt. Her HDL is too low at 34, but triglycerides and LDL have improved.  4. Hypothyroidism Discussed labs with patient today. Pt is on Synthroid per PCP. She denies symptoms or concerns. Her TSH is within normal limits at 2.49.  Assessment/Plan:  No orders of the defined types were placed in this encounter.   Medications Discontinued During This Encounter  Medication Reason   Vitamin D, Ergocalciferol, (DRISDOL) 1.25 MG (50000 UNIT) CAPS capsule Reorder   Vitamin D, Ergocalciferol, (DRISDOL) 1.25 MG (50000 UNIT) CAPS capsule      Meds ordered this encounter  Medications   DISCONTD: Vitamin D, Ergocalciferol, (DRISDOL) 1.25 MG (50000 UNIT) CAPS  capsule    Sig: Take 1 capsule (50,000 Units total) by mouth every 7 (seven) days.    Dispense:  4 capsule    Refill:  0    30 d supply;  ** OV for RF **   Do not send RF request   Vitamin D, Ergocalciferol, (DRISDOL) 1.25 MG (50000 UNIT) CAPS capsule    Sig: Take 1 capsule (50,000 Units total) by mouth every 7 (seven) days.    Dispense:  4 capsule    Refill:  0    30 d supply;  ** OV for RF **   Do not send RF request     1. Prediabetes A1c much improved and now within normal limits at 5.4 and down from 6.3. Fasting insulin is half of what it was previously. Alicia Cox will continue to work on weight loss, exercise, and decreasing simple carbohydrates to help decrease the risk of diabetes. Continue prudent nutritional plan and weight loss.  2. Vitamin D deficiency Low Vitamin D level contributes to fatigue and are associated with obesity, breast, and colon cancer. She agrees to continue to take prescription Vitamin D @50 ,000 IU every week and will follow-up for routine testing of Vitamin D, at least 2-3 times per year to avoid over-replacement. Repeat lab in 3-4 months.  Refill- Vitamin D, Ergocalciferol, (DRISDOL) 1.25 MG (50000 UNIT) CAPS capsule; Take 1 capsule (50,000 Units total) by mouth every 7 (seven) days.  Dispense: 4 capsule; Refill: 0  3. Other hyperlipidemia Triglycerides and LDL have decreased and are now within normal limits but HDL  is too low at 34. Cardiovascular risk and specific lipid/LDL goals reviewed.  We discussed several lifestyle modifications today and Alicia Cox will continue to work on diet, exercise and weight loss efforts. Orders and follow up as documented in patient record. Increase exercise, prudent nutritional plan, and avoid saturated and trans fats.  Counseling Intensive lifestyle modifications are the first line treatment for this issue. Dietary changes: Increase soluble fiber. Decrease simple carbohydrates. Exercise changes: Moderate to vigorous-intensity  aerobic activity 150 minutes per week if tolerated. Lipid-lowering medications: see documented in medical record.  4. Hypothyroidism Patient with long-standing hypothyroidism, on levothyroxine therapy. She appears euthyroid. Orders and follow up as documented in patient record. Continue Synthroid daily.  Counseling Good thyroid control is important for overall health. Supratherapeutic thyroid levels are dangerous and will not improve weight loss results. The correct way to take levothyroxine is fasting, with water, separated by at least 30 minutes from breakfast, and separated by more than 4 hours from calcium, iron, multivitamins, acid reflux medications (PPIs).   5. Obesity, current BMI 36.7 Alicia Cox is currently in the action stage of change. As such, her goal is to continue with weight loss efforts. She has agreed to the Category 2 Plan with 6 oz of protein at lunch.   Pt encouraged to pay attention to hunger after lunch and what could be the cause- Is it emotional or physical hunger?  Exercise goals:  Pt's goal is 5 minutes of walking daily.  Behavioral modification strategies: decreasing simple carbohydrates and emotional eating strategies.  Alicia Cox has agreed to follow-up with our clinic in 3 weeks. She was informed of the importance of frequent follow-up visits to maximize her success with intensive lifestyle modifications for her multiple health conditions.   Objective:   Blood pressure 110/74, pulse 75, temperature 97.6 F (36.4 C), height 5\' 4"  (1.626 m), weight 214 lb (97.1 kg), SpO2 98 %. Body mass index is 36.73 kg/m.  General: Cooperative, alert, well developed, in no acute distress. HEENT: Conjunctivae and lids unremarkable. Cardiovascular: Regular rhythm.  Lungs: Normal work of breathing. Neurologic: No focal deficits.   Lab Results  Component Value Date   CREATININE 0.75 07/17/2022   BUN 17 07/17/2022   NA 142 07/17/2022   K 4.5 07/17/2022   CL 105 07/17/2022    CO2 24 07/17/2022   Lab Results  Component Value Date   ALT 18 07/17/2022   AST 14 07/17/2022   ALKPHOS 58 07/17/2022   BILITOT 0.2 07/17/2022   Lab Results  Component Value Date   HGBA1C 5.4 07/17/2022   HGBA1C 5.9 (H) 03/08/2022   HGBA1C 6.3 07/05/2021   HGBA1C 5.3 03/18/2020   HGBA1C 5.8 09/24/2018   Lab Results  Component Value Date   INSULIN 16.5 07/17/2022   INSULIN 30.2 (H) 03/08/2022   Lab Results  Component Value Date   TSH 2.490 07/17/2022   Lab Results  Component Value Date   CHOL 141 07/17/2022   HDL 34 (L) 07/17/2022   LDLCALC 85 07/17/2022   LDLDIRECT 104.0 09/17/2017   TRIG 119 07/17/2022   CHOLHDL 3.5 03/08/2022   Lab Results  Component Value Date   VD25OH 38.6 07/17/2022   VD25OH 37.0 03/08/2022   VD25OH 42 08/22/2013   Lab Results  Component Value Date   WBC 8.2 07/17/2022   HGB 15.2 07/17/2022   HCT 44.8 07/17/2022   MCV 99 (H) 07/17/2022   PLT 318 07/17/2022   Lab Results  Component Value Date   IRON 147 (  H) 02/06/2018   Attestation Statements:   Reviewed by clinician on day of visit: allergies, medications, problem list, medical history, surgical history, family history, social history, and previous encounter notes.  Time spent on visit including pre-visit chart review and post-visit care and charting was 40 minutes.   I, Kyung Rudd, BS, CMA, am acting as transcriptionist for [employer], DO.  I have reviewed the above documentation for accuracy and completeness, and I agree with the above. [name], D.O.  The 21st Century Cures Act was signed into law in 2016 which includes the topic of electronic health records.  This provides immediate access to information in MyChart.  This includes consultation notes, operative notes, office notes, lab results and pathology reports.  If you have any questions about what you read please let us know at your next visit so we can discuss your concerns and take corrective action  if need be.  We are right here with you.

## 2022-08-23 ENCOUNTER — Ambulatory Visit (INDEPENDENT_AMBULATORY_CARE_PROVIDER_SITE_OTHER): Payer: 59 | Admitting: Family Medicine

## 2022-08-23 ENCOUNTER — Encounter (INDEPENDENT_AMBULATORY_CARE_PROVIDER_SITE_OTHER): Payer: Self-pay | Admitting: Family Medicine

## 2022-08-23 VITALS — BP 104/72 | HR 79 | Temp 98.3°F | Ht 64.0 in | Wt 214.0 lb

## 2022-08-23 DIAGNOSIS — Z6836 Body mass index (BMI) 36.0-36.9, adult: Secondary | ICD-10-CM

## 2022-08-23 DIAGNOSIS — E559 Vitamin D deficiency, unspecified: Secondary | ICD-10-CM | POA: Diagnosis not present

## 2022-08-23 DIAGNOSIS — E669 Obesity, unspecified: Secondary | ICD-10-CM

## 2022-08-25 ENCOUNTER — Other Ambulatory Visit: Payer: Self-pay | Admitting: Internal Medicine

## 2022-08-29 ENCOUNTER — Other Ambulatory Visit: Payer: Self-pay | Admitting: Internal Medicine

## 2022-08-30 NOTE — Progress Notes (Signed)
Chief Complaint:   OBESITY Racquelle is here to discuss her progress with her obesity treatment plan along with follow-up of her obesity related diagnoses. Arrington is on the Category 2 Plan with 6 oz protein at lunch and states she is following her eating plan approximately 98% of the time. Lavoris states she is walking 5 minutes 3 times per week.  Today's visit was #: 11 Starting weight: 252 lbs Starting date: 03/08/2022 Today's weight: 214 lbs Today's date: 08/23/2022 Total lbs lost to date: 38 Total lbs lost since last in-office visit: 0  Interim History: Pt gained 5.4 lbs in water weight since her last OV. She has been following plan very closely and possibly missing some foods during the day. Pt is no longer with hunger after lunch when she breaks her lunch up.  Subjective:   1. Vitamin D deficiency Jaanvi started taking Vit D weekly and is feeling more energetic.  Assessment/Plan:  No orders of the defined types were placed in this encounter.   There are no discontinued medications.   No orders of the defined types were placed in this encounter.    1. Vitamin D deficiency Low Vitamin D level contributes to fatigue and are associated with obesity, breast, and colon cancer. She agrees to continue to take prescription Vitamin D @50 ,000 IU every week and will follow-up for routine testing of Vitamin D, at least 2-3 times per year to avoid over-replacement.  2. Obesity, current BMI 36.8 Chonte is currently in the action stage of change. As such, her goal is to continue with weight loss efforts. She has agreed to the Category 2 Plan with 6 oz protein at lunch or keeping a food journal and adhering to recommended goals of 1300-1400 calories and 110+ grams protein.   Exercise goals:  Walk on treadmill 5-10 minutes 4 days a week.  Behavioral modification strategies: planning for success.  Cami has agreed to follow-up with our clinic in 3 weeks. She was informed of the  importance of frequent follow-up visits to maximize her success with intensive lifestyle modifications for her multiple health conditions.   Objective:   Pulse 79, temperature 98.3 F (36.8 C), height 5\' 4"  (1.626 m), weight 214 lb (97.1 kg), SpO2 95 %. Body mass index is 36.73 kg/m.  General: Cooperative, alert, well developed, in no acute distress. HEENT: Conjunctivae and lids unremarkable. Cardiovascular: Regular rhythm.  Lungs: Normal work of breathing. Neurologic: No focal deficits.   Lab Results  Component Value Date   CREATININE 0.75 07/17/2022   BUN 17 07/17/2022   NA 142 07/17/2022   K 4.5 07/17/2022   CL 105 07/17/2022   CO2 24 07/17/2022   Lab Results  Component Value Date   ALT 18 07/17/2022   AST 14 07/17/2022   ALKPHOS 58 07/17/2022   BILITOT 0.2 07/17/2022   Lab Results  Component Value Date   HGBA1C 5.4 07/17/2022   HGBA1C 5.9 (H) 03/08/2022   HGBA1C 6.3 07/05/2021   HGBA1C 5.3 03/18/2020   HGBA1C 5.8 09/24/2018   Lab Results  Component Value Date   INSULIN 16.5 07/17/2022   INSULIN 30.2 (H) 03/08/2022   Lab Results  Component Value Date   TSH 2.490 07/17/2022   Lab Results  Component Value Date   CHOL 141 07/17/2022   HDL 34 (L) 07/17/2022   LDLCALC 85 07/17/2022   LDLDIRECT 104.0 09/17/2017   TRIG 119 07/17/2022   CHOLHDL 3.5 03/08/2022   Lab Results  Component Value  Date   VD25OH 38.6 07/17/2022   VD25OH 37.0 03/08/2022   VD25OH 42 08/22/2013   Lab Results  Component Value Date   WBC 8.2 07/17/2022   HGB 15.2 07/17/2022   HCT 44.8 07/17/2022   MCV 99 (H) 07/17/2022   PLT 318 07/17/2022   Lab Results  Component Value Date   IRON 147 (H) 02/06/2018     Attestation Statements:   Reviewed by clinician on day of visit: allergies, medications, problem list, medical history, surgical history, family history, social history, and previous encounter notes.  Time spent on visit including pre-visit chart review and post-visit  care and charting was 20 minutes.   I, Kyung Rudd, BS, CMA, am acting as transcriptionist for [employer], DO.  I have reviewed the above documentation for accuracy and completeness, and I agree with the above. [name], D.O.  The 21st Century Cures Act was signed into law in 2016 which includes the topic of electronic health records.  This provides immediate access to information in MyChart.  This includes consultation notes, operative notes, office notes, lab results and pathology reports.  If you have any questions about what you read please let us know at your next visit so we can discuss your concerns and take corrective action if need be.  We are right here with you.

## 2022-09-03 ENCOUNTER — Other Ambulatory Visit: Payer: Self-pay | Admitting: Family Medicine

## 2022-09-03 DIAGNOSIS — M542 Cervicalgia: Secondary | ICD-10-CM

## 2022-09-03 DIAGNOSIS — M79622 Pain in left upper arm: Secondary | ICD-10-CM

## 2022-09-04 ENCOUNTER — Encounter: Payer: Self-pay | Admitting: Internal Medicine

## 2022-09-05 ENCOUNTER — Other Ambulatory Visit: Payer: Self-pay

## 2022-09-13 ENCOUNTER — Ambulatory Visit (INDEPENDENT_AMBULATORY_CARE_PROVIDER_SITE_OTHER): Payer: 59 | Admitting: Family Medicine

## 2022-09-13 ENCOUNTER — Encounter (INDEPENDENT_AMBULATORY_CARE_PROVIDER_SITE_OTHER): Payer: Self-pay | Admitting: Family Medicine

## 2022-09-13 VITALS — BP 114/65 | HR 73 | Temp 98.1°F | Ht 64.0 in | Wt 212.0 lb

## 2022-09-13 DIAGNOSIS — E669 Obesity, unspecified: Secondary | ICD-10-CM

## 2022-09-13 DIAGNOSIS — Z6836 Body mass index (BMI) 36.0-36.9, adult: Secondary | ICD-10-CM

## 2022-09-13 DIAGNOSIS — J302 Other seasonal allergic rhinitis: Secondary | ICD-10-CM

## 2022-09-13 DIAGNOSIS — E559 Vitamin D deficiency, unspecified: Secondary | ICD-10-CM | POA: Diagnosis not present

## 2022-09-13 DIAGNOSIS — E66813 Obesity, class 3: Secondary | ICD-10-CM

## 2022-09-13 DIAGNOSIS — R7303 Prediabetes: Secondary | ICD-10-CM | POA: Diagnosis not present

## 2022-09-13 DIAGNOSIS — Z9189 Other specified personal risk factors, not elsewhere classified: Secondary | ICD-10-CM

## 2022-09-14 DIAGNOSIS — Z9189 Other specified personal risk factors, not elsewhere classified: Secondary | ICD-10-CM | POA: Insufficient documentation

## 2022-09-20 NOTE — Progress Notes (Signed)
Chief Complaint:   OBESITY Alicia Cox is here to discuss her progress with her obesity treatment plan along with follow-up of her obesity related diagnoses. Alicia Cox is on the Category 2 Plan with 6 oz protein at lunch and keeping a food journal and adhering to recommended goals of 1300-1400 calories and 110+ grams protein and states she is following her eating plan approximately 99% of the time. Alicia Cox states she is walking 15 minutes 4 times per week.  Today's visit was #: 12 Starting weight: 252 lbs Starting date: 03/08/2022 Today's weight: 212 lbs Today's date: 09/13/2022 Total lbs lost to date: 40 Total lbs lost since last in-office visit: 2  Interim History: Alicia Cox hit her exercise goals and surpassed them!! She lost 16 lbs of fat mass and gained 14 lbs in muscle mass since her last OV 3 weeks ago. She denies issues with meal plan, and has focused on proteins.  Subjective:   1. Prediabetes Alicia Cox's A1c went from 5.9 on 3/23 to 5.4 in July 2023. Her fasting insulin was recently 16.5 and previously 30.2. She denies hunger or cravings and denies concerns.   2. Seasonal allergies Pt's symptoms have been acutely worse lately. She has a history of asthma but denies SOB. Pt is taking Zyrtec and Singulair, and she plans to add an OTC anti-histamine in the near future.  3. Vitamin D deficiency Alicia Cox's Vit D was 38.6 and not at goal on 07/17/2022. She has been more consistent with taking it lately.  4. At risk for constipation Alicia Cox is at risk for constipation due to increased protein intake.  Assessment/Plan:  No orders of the defined types were placed in this encounter.   There are no discontinued medications.   No orders of the defined types were placed in this encounter.    1. Prediabetes This is a new diagnosis for Alicia Cox - I counseled patient on pathophysiology of the disease process of I.R. and Pre-DM.   - Stressed importance of dietary and lifestyle  modifications to result in weight loss as first line txmnt - In addition, we discussed the risks and benefits of various medication options which can help [place] in the management of this disease process as well as with weight loss.  Will consider starting one of these meds in future as we will focus on prudent nutritional plan at this time.  - Continue to decrease simple carbs/ sugars; increase fiber and proteins -> follow her meal plan.   - Explained role of simple carbs and insulin levels on hunger and cravings - Handouts provided at pt's request after education provided.  All concerns/questions addressed.   - Anticipatory guidance given.   Alicia Cox will continue to work on weight loss, exercise, via their meal plan we devised to help decrease the risk of progressing to diabetes.  - We will recheck A1c and fasting insulin level in approximately 3 months from last check, or as deemed appropriate.   2. Seasonal allergies Continue meds as prescribed by her PCP/specialists. Alicia Cox has seasonal allergies that have been more bothersome with the change of weather/season. Pt takes the prescribed medications regularly, which has improved symptoms some and her quality of life.  Tolerating well, denies concerns. Refill prn- see below. - Seasonal / environmental allergies and pathophysiology of disease process discussed with patient.  - Preventative strategies as first line for management discussed.  I encouraged use of KN or N-95 mask prn as well as sterile saline rinses such  as Alicia Cox Med or [name] to be done once- twice daily and after any prolonged exposure to the environment or allergen.    It is best to use distilled water or previously boiled water    - If eyes are itchy or irritated feeling when your seasonal allergies get bad, ok to use Naphcon-A over-the-counter eyedrops as needed - Encouraged to shower in evenings if they have any prolonged exposure to allergens during the day  3.  Vitamin D deficiency - I again reiterated the importance of vitamin D (as well as calcium) to their health and wellbeing.  - I reviewed possible symptoms of low Vitamin D:  low energy, depressed mood, muscle aches, joint aches, osteoporosis etc. - low Vitamin D levels may be linked to an increased risk of cardiovascular events and even increased risk of cancers- such as colon and breast.  - ideal vitamin D levels reviewed with patient  - I recommend pt take a 50,000 IU weekly prescription vit D - see script below   - Informed patient this may be a lifelong thing, and she was encouraged to continue to take the medicine until told otherwise.    - weight loss will likely improve availability of vitamin D, thus encouraged Alicia Cox to continue with meal plan and their weight loss efforts to further improve this condition.  Thus, we will need to monitor levels regularly (every 3-4 mo on average) to keep levels within normal limits and prevent over supplementation. - pt's questions and concerns regarding this condition addressed.  4. At risk for constipation Alicia Cox is at increased risk for constipation due to inadequate water intake, changes in diet, and/or use of certain medications. Patient was provided with 9 minutes of counseling today regarding this condition and related ones.  We discussed preventative OTC therapies that exist such as MiraLAX, stool softeners, etc.   We also discussed the importance of adequate fiber intake and for patient to drink 1/2 weight in ounces of water per day, unless told otherwise by a Cardiologist, Nephrologist, or another medical provider.    5. Obesity, current BMI 36.5 Alicia Cox is currently in the action stage of change. As such, her goal is to continue with weight loss efforts. She has agreed to the Category 2 Plan with 6 oz of protein at lunch and keeping a food journal and adhering to recommended goals of 1300-1400 calories and 110+ grams protein.   Pt  would like to keep the same exercise goals and develop consistency with the regimen.  Exercise goals:  As is  Behavioral modification strategies: decreasing eating out, no skipping meals, and avoiding temptations.  Lylliana has agreed to follow-up with our clinic in 3 weeks. She was informed of the importance of frequent follow-up visits to maximize her success with intensive lifestyle modifications for her multiple health conditions.   Objective:   Blood pressure 114/65, pulse 73, temperature 98.1 F (36.7 C), height 5\' 4"  (1.626 m), weight 212 lb (96.2 kg), SpO2 98 %. Body mass index is 36.39 kg/m.  General: Cooperative, alert, well developed, in no acute distress. HEENT: Conjunctivae and lids unremarkable. Cardiovascular: Regular rhythm.  Lungs: Normal work of breathing. Neurologic: No focal deficits.   Lab Results  Component Value Date   CREATININE 0.75 07/17/2022   BUN 17 07/17/2022   NA 142 07/17/2022   K 4.5 07/17/2022   CL 105 07/17/2022   CO2 24 07/17/2022   Lab Results  Component Value Date   ALT 18  07/17/2022   AST 14 07/17/2022   ALKPHOS 58 07/17/2022   BILITOT 0.2 07/17/2022   Lab Results  Component Value Date   HGBA1C 5.4 07/17/2022   HGBA1C 5.9 (H) 03/08/2022   HGBA1C 6.3 07/05/2021   HGBA1C 5.3 03/18/2020   HGBA1C 5.8 09/24/2018   Lab Results  Component Value Date   INSULIN 16.5 07/17/2022   INSULIN 30.2 (H) 03/08/2022   Lab Results  Component Value Date   TSH 2.490 07/17/2022   Lab Results  Component Value Date   CHOL 141 07/17/2022   HDL 34 (L) 07/17/2022   LDLCALC 85 07/17/2022   LDLDIRECT 104.0 09/17/2017   TRIG 119 07/17/2022   CHOLHDL 3.5 03/08/2022   Lab Results  Component Value Date   VD25OH 38.6 07/17/2022   VD25OH 37.0 03/08/2022   VD25OH 42 08/22/2013   Lab Results  Component Value Date   WBC 8.2 07/17/2022   HGB 15.2 07/17/2022   HCT 44.8 07/17/2022   MCV 99 (H) 07/17/2022   PLT 318 07/17/2022   Lab Results   Component Value Date   IRON 147 (H) 02/06/2018   Attestation Statements:   Reviewed by clinician on day of visit: allergies, medications, problem list, medical history, surgical history, family history, social history, and previous encounter notes.  I, Kathlene November, BS, CMA, am acting as transcriptionist for [employer], DO.  I have reviewed the above documentation for accuracy and completeness, and I agree with the above. Marjory Sneddon, D.O.  The [place] was signed into law in 2016 which includes the topic of electronic health records.  This provides immediate access to information in MyChart.  This includes consultation notes, operative notes, office notes, lab results and pathology reports.  If you have any questions about what you read please let us know at your next visit so we can discuss your concerns and take corrective action if need be.  We are right here with you.

## 2022-10-04 ENCOUNTER — Ambulatory Visit (INDEPENDENT_AMBULATORY_CARE_PROVIDER_SITE_OTHER): Payer: 59 | Admitting: Family Medicine

## 2022-10-04 ENCOUNTER — Encounter (INDEPENDENT_AMBULATORY_CARE_PROVIDER_SITE_OTHER): Payer: Self-pay | Admitting: Family Medicine

## 2022-10-04 VITALS — BP 118/72 | HR 85 | Temp 98.3°F | Ht 64.0 in | Wt 208.0 lb

## 2022-10-04 DIAGNOSIS — Z6835 Body mass index (BMI) 35.0-35.9, adult: Secondary | ICD-10-CM

## 2022-10-04 DIAGNOSIS — E669 Obesity, unspecified: Secondary | ICD-10-CM | POA: Diagnosis not present

## 2022-10-04 DIAGNOSIS — E559 Vitamin D deficiency, unspecified: Secondary | ICD-10-CM | POA: Diagnosis not present

## 2022-10-04 MED ORDER — VITAMIN D (ERGOCALCIFEROL) 1.25 MG (50000 UNIT) PO CAPS: 50000.0000 [IU] | ORAL_CAPSULE | ORAL | 0 refills | Status: DC

## 2022-10-10 ENCOUNTER — Other Ambulatory Visit: Payer: Self-pay | Admitting: Internal Medicine

## 2022-10-25 ENCOUNTER — Encounter (INDEPENDENT_AMBULATORY_CARE_PROVIDER_SITE_OTHER): Payer: Self-pay | Admitting: Family Medicine

## 2022-10-25 ENCOUNTER — Ambulatory Visit (INDEPENDENT_AMBULATORY_CARE_PROVIDER_SITE_OTHER): Payer: 59 | Admitting: Family Medicine

## 2022-10-25 VITALS — BP 118/77 | HR 66 | Temp 98.4°F | Ht 64.0 in | Wt 206.2 lb

## 2022-10-25 DIAGNOSIS — Z6835 Body mass index (BMI) 35.0-35.9, adult: Secondary | ICD-10-CM

## 2022-10-25 DIAGNOSIS — E66813 Obesity, class 3: Secondary | ICD-10-CM

## 2022-10-25 DIAGNOSIS — E669 Obesity, unspecified: Secondary | ICD-10-CM

## 2022-10-25 DIAGNOSIS — F39 Unspecified mood [affective] disorder: Secondary | ICD-10-CM | POA: Diagnosis not present

## 2022-10-25 DIAGNOSIS — E559 Vitamin D deficiency, unspecified: Secondary | ICD-10-CM | POA: Diagnosis not present

## 2022-10-25 DIAGNOSIS — R69 Illness, unspecified: Secondary | ICD-10-CM | POA: Diagnosis not present

## 2022-10-25 NOTE — Progress Notes (Signed)
Chief Complaint:   OBESITY Alicia Cox is here to discuss her progress with her obesity treatment plan along with follow-up of her obesity related diagnoses. [name] is on the Category 2 Plan with 6 oz protein at lunch and keeping a food journal and adhering to recommended goals of 1300-1400 calories and 110+ grams protein and states she is following her eating plan approximately 99% of the time. [name] states she is walking 20 minutes 4 times per week.  Today's visit was #: 13 Starting weight: 252 lbs Starting date: 03/08/2022 Today's weight: 208 lbs Today's date: 10/04/2022 Total lbs lost to date: 44 Total lbs lost since last in-office visit: 4  Interim History: Pt was a little hungry at times, but she would have a little more lean proteins. Pt doesn't want to go on meds.  Subjective:   1. Vitamin D deficiency She is currently taking prescription vitamin D 50,000 IU each week. She denies nausea, vomiting or muscle weakness.  Assessment/Plan:  No orders of the defined types were placed in this encounter.   Medications Discontinued During This Encounter  Medication Reason   Vitamin D, Ergocalciferol, (DRISDOL) 1.25 MG (50000 UNIT) CAPS capsule Reorder   Vitamin D, Ergocalciferol, (DRISDOL) 1.25 MG (50000 UNIT) CAPS capsule      Meds ordered this encounter  Medications   DISCONTD: Vitamin D, Ergocalciferol, (DRISDOL) 1.25 MG (50000 UNIT) CAPS capsule    Sig: Take 1 capsule (50,000 Units total) by mouth every 7 (seven) days.    Dispense:  4 capsule    Refill:  0    30 d supply;  ** OV for RF **   Do not send RF request   Vitamin D, Ergocalciferol, (DRISDOL) 1.25 MG (50000 UNIT) CAPS capsule    Sig: Take 1 capsule (50,000 Units total) by mouth every 7 (seven) days.    Dispense:  4 capsule    Refill:  0    30 d supply;  ** OV for RF **   Do not send RF request     1. Vitamin D deficiency Low Vitamin D level contributes to fatigue and are associated with obesity, breast,  and colon cancer. She agrees to continue to take prescription Vitamin D @50 ,000 IU every week and will follow-up for routine testing of Vitamin D, at least 2-3 times per year to avoid over-replacement.  Refill- Vitamin D, Ergocalciferol, (DRISDOL) 1.25 MG (50000 UNIT) CAPS capsule; Take 1 capsule (50,000 Units total) by mouth every 7 (seven) days.  Dispense: 4 capsule; Refill: 0  2. Obesity, current BMI 35.7 [name] is currently in the action stage of change. As such, her goal is to continue with weight loss efforts. She has agreed to the Category 2 Plan with 6 oz protein at lunch and keeping a food journal and adhering to recommended goals of 1300-1400 calories and 110+ grams protein.   Exercise goals:  As is and increase as tolerated.  Behavioral modification strategies: increasing lean protein intake, better snacking choices, and planning for success.  Kayda has agreed to follow-up with our clinic in 3 weeks. She was informed of the importance of frequent follow-up visits to maximize her success with intensive lifestyle modifications for her multiple health conditions.   Objective:   Blood pressure 118/72, pulse 85, temperature 98.3 F (36.8 C), height 5\' 4"  (1.626 m), weight 208 lb (94.3 kg), SpO2 98 %. Body mass index is 35.7 kg/m.  General: Cooperative, alert, well developed, in no acute distress. HEENT: Conjunctivae and  lids unremarkable. Cardiovascular: Regular rhythm.  Lungs: Normal work of breathing. Neurologic: No focal deficits.   Lab Results  Component Value Date   CREATININE 0.75 07/17/2022   BUN 17 07/17/2022   NA 142 07/17/2022   K 4.5 07/17/2022   CL 105 07/17/2022   CO2 24 07/17/2022   Lab Results  Component Value Date   ALT 18 07/17/2022   AST 14 07/17/2022   ALKPHOS 58 07/17/2022   BILITOT 0.2 07/17/2022   Lab Results  Component Value Date   HGBA1C 5.4 07/17/2022   HGBA1C 5.9 (H) 03/08/2022   HGBA1C 6.3 07/05/2021   HGBA1C 5.3 03/18/2020   HGBA1C  5.8 09/24/2018   Lab Results  Component Value Date   INSULIN 16.5 07/17/2022   INSULIN 30.2 (H) 03/08/2022   Lab Results  Component Value Date   TSH 2.490 07/17/2022   Lab Results  Component Value Date   CHOL 141 07/17/2022   HDL 34 (L) 07/17/2022   LDLCALC 85 07/17/2022   LDLDIRECT 104.0 09/17/2017   TRIG 119 07/17/2022   CHOLHDL 3.5 03/08/2022   Lab Results  Component Value Date   VD25OH 38.6 07/17/2022   VD25OH 37.0 03/08/2022   VD25OH 42 08/22/2013   Lab Results  Component Value Date   WBC 8.2 07/17/2022   HGB 15.2 07/17/2022   HCT 44.8 07/17/2022   MCV 99 (H) 07/17/2022   PLT 318 07/17/2022   Lab Results  Component Value Date   IRON 147 (H) 02/06/2018    Attestation Statements:   Reviewed by clinician on day of visit: allergies, medications, problem list, medical history, surgical history, family history, social history, and previous encounter notes.  I, Kyung Rudd, BS, CMA, am acting as transcriptionist for [employer], DO.   I have reviewed the above documentation for accuracy and completeness, and I agree with the above. [name], D.O.  The 21st Century Cures Act was signed into law in 2016 which includes the topic of electronic health records.  This provides immediate access to information in MyChart.  This includes consultation notes, operative notes, office notes, lab results and pathology reports.  If you have any questions about what you read please let us know at your next visit so we can discuss your concerns and take corrective action if need be.  We are right here with you.

## 2022-10-27 ENCOUNTER — Other Ambulatory Visit: Payer: Self-pay | Admitting: Family Medicine

## 2022-11-07 ENCOUNTER — Other Ambulatory Visit: Payer: Self-pay | Admitting: Family Medicine

## 2022-11-07 NOTE — Progress Notes (Signed)
Chief Complaint:   OBESITY Alicia Cox is here to discuss her progress with her obesity treatment plan along with follow-up of her obesity related diagnoses. Alicia Cox is on keeping a food journal and adhering to recommended goals of 1300-1400 calories and 110+ protein and states she is following her eating plan approximately 98% of the time. Alicia Cox states she is walking 20 minutes 4 times per week.  Today's visit was #: 14 Starting weight: 252 lbs Starting date: 03/08/2022 Today's weight: 206 lbs Today's date: 10/25/2022 Total lbs lost to date: 46 lbs Total lbs lost since last in-office visit: 2 lbs  Interim History: She is not weighing herself anymore at home. Occasionally has sweet cravings, mostly after dinner.    Subjective:   1. Vitamin D deficiency On ergocalciferol, tolerating well.  Compliance is good.    2. Mood disorder (HCC)-EMOTIONAL EATING On Wellbutrin daily per PCP.  Mood is very well controlled currently.  Feeling proud of her weight and much more energy.   Assessment/Plan:  No orders of the defined types were placed in this encounter.   There are no discontinued medications.   No orders of the defined types were placed in this encounter.    1. Vitamin D deficiency Continue ergocalciferol, no need for refill at this time.  Continue weight bearing exercise.   2. Mood disorder (HCC)-EMOTIONAL EATING Continue medication per PCP. Emotional eating strategies discussed with patient.   3. Obesity, current BMI 35.4 Patient would like to try to work out her cravings on her own and avoid medications if possible.  Create new habits at night to prevent emotional eating.   Alicia Cox is currently in the action stage of change. As such, her goal is to continue with weight loss efforts. She has agreed to the Category 2 Plan+ breakfast and lunch options.   Exercise goals:  As is, increase exercise as tolerated.   Behavioral modification strategies: meal planning and  cooking strategies.  Alicia Cox has agreed to follow-up with our clinic in 3 weeks. She was informed of the importance of frequent follow-up visits to maximize her success with intensive lifestyle modifications for her multiple health conditions.   Objective:   Blood pressure 118/77, pulse 66, temperature 98.4 F (36.9 C), height 5\' 4"  (1.626 m), weight 206 lb 3.2 oz (93.5 kg), SpO2 95 %. Body mass index is 35.39 kg/m.  General: Cooperative, alert, well developed, in no acute distress. HEENT: Conjunctivae and lids unremarkable. Cardiovascular: Regular rhythm.  Lungs: Normal work of breathing. Neurologic: No focal deficits.   Lab Results  Component Value Date   CREATININE 0.75 07/17/2022   BUN 17 07/17/2022   NA 142 07/17/2022   K 4.5 07/17/2022   CL 105 07/17/2022   CO2 24 07/17/2022   Lab Results  Component Value Date   ALT 18 07/17/2022   AST 14 07/17/2022   ALKPHOS 58 07/17/2022   BILITOT 0.2 07/17/2022   Lab Results  Component Value Date   HGBA1C 5.4 07/17/2022   HGBA1C 5.9 (H) 03/08/2022   HGBA1C 6.3 07/05/2021   HGBA1C 5.3 03/18/2020   HGBA1C 5.8 09/24/2018   Lab Results  Component Value Date   INSULIN 16.5 07/17/2022   INSULIN 30.2 (H) 03/08/2022   Lab Results  Component Value Date   TSH 2.490 07/17/2022   Lab Results  Component Value Date   CHOL 141 07/17/2022   HDL 34 (L) 07/17/2022   LDLCALC 85 07/17/2022   LDLDIRECT 104.0 09/17/2017   TRIG 119  07/17/2022   CHOLHDL 3.5 03/08/2022   Lab Results  Component Value Date   VD25OH 38.6 07/17/2022   VD25OH 37.0 03/08/2022   VD25OH 42 08/22/2013   Lab Results  Component Value Date   WBC 8.2 07/17/2022   HGB 15.2 07/17/2022   HCT 44.8 07/17/2022   MCV 99 (H) 07/17/2022   PLT 318 07/17/2022   Lab Results  Component Value Date   IRON 147 (H) 02/06/2018    Attestation Statements:   Reviewed by clinician on day of visit: allergies, medications, problem list, medical history, surgical history,  family history, social history, and previous encounter notes.  Time spent on visit including pre-visit chart review and post-visit care and charting was 23 minutes.   I, Malcolm Metro, RMA, am acting as Energy manager for [employer], DO.   I have reviewed the above documentation for accuracy and completeness, and I agree with the above. [name], D.O.  The 21st Century Cures Act was signed into law in 2016 which includes the topic of electronic health records.  This provides immediate access to information in MyChart.  This includes consultation notes, operative notes, office notes, lab results and pathology reports.  If you have any questions about what you read please let us know at your next visit so we can discuss your concerns and take corrective action if need be.  We are right here with you.

## 2022-11-14 ENCOUNTER — Ambulatory Visit (INDEPENDENT_AMBULATORY_CARE_PROVIDER_SITE_OTHER): Payer: 59 | Admitting: Family Medicine

## 2022-11-23 ENCOUNTER — Other Ambulatory Visit: Payer: Self-pay | Admitting: Family Medicine

## 2022-11-23 DIAGNOSIS — M79622 Pain in left upper arm: Secondary | ICD-10-CM

## 2022-11-23 DIAGNOSIS — M542 Cervicalgia: Secondary | ICD-10-CM

## 2022-12-01 DIAGNOSIS — Z20822 Contact with and (suspected) exposure to covid-19: Secondary | ICD-10-CM | POA: Diagnosis not present

## 2022-12-01 DIAGNOSIS — J020 Streptococcal pharyngitis: Secondary | ICD-10-CM | POA: Diagnosis not present

## 2022-12-01 DIAGNOSIS — Z6835 Body mass index (BMI) 35.0-35.9, adult: Secondary | ICD-10-CM | POA: Diagnosis not present

## 2022-12-01 DIAGNOSIS — J029 Acute pharyngitis, unspecified: Secondary | ICD-10-CM | POA: Diagnosis not present

## 2022-12-06 ENCOUNTER — Ambulatory Visit (INDEPENDENT_AMBULATORY_CARE_PROVIDER_SITE_OTHER): Payer: 59 | Admitting: Family Medicine

## 2022-12-06 ENCOUNTER — Encounter (INDEPENDENT_AMBULATORY_CARE_PROVIDER_SITE_OTHER): Payer: Self-pay | Admitting: Family Medicine

## 2022-12-06 VITALS — BP 135/78 | HR 65 | Temp 98.0°F | Ht 64.0 in | Wt 200.6 lb

## 2022-12-06 DIAGNOSIS — E559 Vitamin D deficiency, unspecified: Secondary | ICD-10-CM | POA: Diagnosis not present

## 2022-12-06 DIAGNOSIS — E669 Obesity, unspecified: Secondary | ICD-10-CM | POA: Diagnosis not present

## 2022-12-06 DIAGNOSIS — Z6834 Body mass index (BMI) 34.0-34.9, adult: Secondary | ICD-10-CM | POA: Diagnosis not present

## 2022-12-06 MED ORDER — VITAMIN D (ERGOCALCIFEROL) 1.25 MG (50000 UNIT) PO CAPS: 50000.0000 [IU] | ORAL_CAPSULE | ORAL | 0 refills | Status: DC

## 2022-12-16 ENCOUNTER — Other Ambulatory Visit: Payer: Self-pay | Admitting: Internal Medicine

## 2022-12-23 ENCOUNTER — Other Ambulatory Visit: Payer: Self-pay | Admitting: Family Medicine

## 2022-12-23 DIAGNOSIS — M79622 Pain in left upper arm: Secondary | ICD-10-CM

## 2022-12-23 DIAGNOSIS — M542 Cervicalgia: Secondary | ICD-10-CM

## 2022-12-29 ENCOUNTER — Encounter: Payer: Self-pay | Admitting: Family Medicine

## 2022-12-29 ENCOUNTER — Other Ambulatory Visit: Payer: Self-pay

## 2022-12-29 DIAGNOSIS — E039 Hypothyroidism, unspecified: Secondary | ICD-10-CM

## 2022-12-29 MED ORDER — LEVOTHYROXINE SODIUM 100 MCG PO TABS: ORAL_TABLET | ORAL | 1 refills | Status: DC

## 2022-12-29 NOTE — Telephone Encounter (Signed)
Medication sent

## 2022-12-30 NOTE — Progress Notes (Signed)
Chief Complaint:   OBESITY Alicia Cox is here to discuss her progress with her obesity treatment plan along with follow-up of her obesity related diagnoses. Alicia Cox is on the Category 2 Plan with breakfast and lunch options and states she is following her eating plan approximately 90% of the time. Alicia Cox states she is not exercising.  Today's visit was #: 15 Starting weight: 252 lbs Starting date: 03/08/2022 Today's weight: 200 LBS Today's date: 12/06/2022 Total lbs lost to date: 45 LBS Total lbs lost since last in-office visit: 6 LBS  Interim History: Patient has strep throat and could not eat much for 3 days.  Since then, back on plan.  Patient wants to try on close after Thanksgiving day and cannot fit into "non-plus" sizes for the first time in 24 years. Subjective:   1. Vitamin D deficiency Vitamin D level 38.6, four months ago.  Very little change in energy levels.  Assessment/Plan:   Orders Placed This Encounter  Procedures   VITAMIN D 25 Hydroxy (Vit-D Deficiency, Fractures)    Medications Discontinued During This Encounter  Medication Reason   Vitamin D, Ergocalciferol, (DRISDOL) 1.25 MG (50000 UNIT) CAPS capsule Reorder     Meds ordered this encounter  Medications   DISCONTD: Vitamin D, Ergocalciferol, (DRISDOL) 1.25 MG (50000 UNIT) CAPS capsule    Sig: Take 1 capsule (50,000 Units total) by mouth every 7 (seven) days.    Dispense:  4 capsule    Refill:  0    30 d supply;  ** OV for RF **   Do not send RF request     1. Vitamin D deficiency Patient is not at goal.  Check vitamin D level next office visit.  Refill- Vitamin D, Ergocalciferol, (DRISDOL) 1.25 MG (50000 UNIT) CAPS capsule; Take 1 capsule (50,000 Units total) by mouth every 7 (seven) days.  Dispense: 4 capsule; Refill: 0  2. Obesity, current BMI 34.4 Patient to get labs next month due to insurance coverage.  Check vitamin D level.  Alicia Cox is currently in the action stage of change. As such,  her goal is to continue with weight loss efforts. She has agreed to the Category 2 Plan with breakfast and lunch options.  Exercise goals: All adults should avoid inactivity. Some physical activity is better than none, and adults who participate in any amount of physical activity gain some health benefits.  Behavioral modification strategies: holiday eating strategies  and celebration eating strategies.  Alicia Cox has agreed to follow-up with our clinic in 4 weeks. She was informed of the importance of frequent follow-up visits to maximize her success with intensive lifestyle modifications for her multiple health conditions.   Objective:   Blood pressure 135/78, pulse 65, temperature 98 F (36.7 C), height 5\' 4"  (1.626 m), weight 200 lb 9.6 oz (91 kg), SpO2 99 %. Body mass index is 34.43 kg/m.  General: Cooperative, alert, well developed, in no acute distress. HEENT: Conjunctivae and lids unremarkable. Cardiovascular: Regular rhythm.  Lungs: Normal work of breathing. Neurologic: No focal deficits.   Lab Results  Component Value Date   CREATININE 0.75 07/17/2022   BUN 17 07/17/2022   NA 142 07/17/2022   K 4.5 07/17/2022   CL 105 07/17/2022   CO2 24 07/17/2022   Lab Results  Component Value Date   ALT 18 07/17/2022   AST 14 07/17/2022   ALKPHOS 58 07/17/2022   BILITOT 0.2 07/17/2022   Lab Results  Component Value Date   HGBA1C 5.4 07/17/2022  HGBA1C 5.9 (H) 03/08/2022   HGBA1C 6.3 07/05/2021   HGBA1C 5.3 03/18/2020   HGBA1C 5.8 09/24/2018   Lab Results  Component Value Date   INSULIN 16.5 07/17/2022   INSULIN 30.2 (H) 03/08/2022   Lab Results  Component Value Date   TSH 2.490 07/17/2022   Lab Results  Component Value Date   CHOL 141 07/17/2022   HDL 34 (L) 07/17/2022   LDLCALC 85 07/17/2022   LDLDIRECT 104.0 09/17/2017   TRIG 119 07/17/2022   CHOLHDL 3.5 03/08/2022   Lab Results  Component Value Date   VD25OH 38.6 07/17/2022   VD25OH 37.0 03/08/2022    VD25OH 42 08/22/2013   Lab Results  Component Value Date   WBC 8.2 07/17/2022   HGB 15.2 07/17/2022   HCT 44.8 07/17/2022   MCV 99 (H) 07/17/2022   PLT 318 07/17/2022   Lab Results  Component Value Date   IRON 147 (H) 02/06/2018   Attestation Statements:   Reviewed by clinician on day of visit: allergies, medications, problem list, medical history, surgical history, family history, social history, and previous encounter notes.  I, Davy Pique, RMA, am acting as Location manager for [employer], DO.   I have reviewed the above documentation for accuracy and completeness, and I agree with the above. Marjory Sneddon, D.O.  The [place] was signed into law in 2016 which includes the topic of electronic health records.  This provides immediate access to information in MyChart.  This includes consultation notes, operative notes, office notes, lab results and pathology reports.  If you have any questions about what you read please let us know at your next visit so we can discuss your concerns and take corrective action if need be.  We are right here with you.

## 2023-01-03 ENCOUNTER — Ambulatory Visit (INDEPENDENT_AMBULATORY_CARE_PROVIDER_SITE_OTHER): Payer: 59 | Admitting: Family Medicine

## 2023-01-03 ENCOUNTER — Encounter (INDEPENDENT_AMBULATORY_CARE_PROVIDER_SITE_OTHER): Payer: Self-pay | Admitting: Family Medicine

## 2023-01-03 VITALS — BP 116/79 | HR 74 | Temp 98.4°F | Ht 64.0 in | Wt 203.6 lb

## 2023-01-03 DIAGNOSIS — R7303 Prediabetes: Secondary | ICD-10-CM

## 2023-01-03 DIAGNOSIS — E559 Vitamin D deficiency, unspecified: Secondary | ICD-10-CM | POA: Diagnosis not present

## 2023-01-03 DIAGNOSIS — Z6834 Body mass index (BMI) 34.0-34.9, adult: Secondary | ICD-10-CM

## 2023-01-03 DIAGNOSIS — E669 Obesity, unspecified: Secondary | ICD-10-CM

## 2023-01-03 MED ORDER — VITAMIN D (ERGOCALCIFEROL) 1.25 MG (50000 UNIT) PO CAPS: 50000.0000 [IU] | ORAL_CAPSULE | ORAL | 0 refills | Status: DC

## 2023-01-04 LAB — VITAMIN D 25 HYDROXY (VIT D DEFICIENCY, FRACTURES): Vit D, 25-Hydroxy: 32.8 ng/mL (ref 30.0–100.0)

## 2023-01-04 LAB — HEMOGLOBIN A1C
Est. average glucose Bld gHb Est-mCnc: 100 mg/dL
Hgb A1c MFr Bld: 5.1 % (ref 4.8–5.6)

## 2023-01-09 ENCOUNTER — Ambulatory Visit: Payer: 59 | Admitting: Family Medicine

## 2023-01-15 DIAGNOSIS — H04123 Dry eye syndrome of bilateral lacrimal glands: Secondary | ICD-10-CM | POA: Diagnosis not present

## 2023-01-15 DIAGNOSIS — L719 Rosacea, unspecified: Secondary | ICD-10-CM | POA: Diagnosis not present

## 2023-01-22 NOTE — Progress Notes (Signed)
Chief Complaint:   OBESITY Alicia Cox is here to discuss her progress with her obesity treatment plan along with follow-up of her obesity related diagnoses. [name] is on the Category 2 Plan with breakfast and lunch options and states she is following her eating plan approximately 98% of the time. [name] states she is not currently exercising.  Today's visit was #: 66 Starting weight: 252 lbs Starting date: 03/08/2022 Today's weight: 203 lbs Today's date: 01/03/2023 Total lbs lost to date: 49 Total lbs lost since last in-office visit: 0  Interim History: Breakfast= eating protein bar approximately 180 calories, 21 grams of protein. Lunch= sandwich, 1 wrap 3 oz and 2nd wrap with 3-4 oz with an apple.   Subjective:   1. Vitamin D deficiency She is currently taking prescription vitamin D 50,000 IU each week. She denies nausea, vomiting or muscle weakness.  2. Prediabetes Last A1c was 07/17/2022 was 5.4. She has a strong family history. She denies hunger or cravings.   Assessment/Plan:   Orders Placed This Encounter  Procedures   VITAMIN D 25 Hydroxy (Vit-D Deficiency, Fractures)   Hemoglobin A1c    Medications Discontinued During This Encounter  Medication Reason   Vitamin D, Ergocalciferol, (DRISDOL) 1.25 MG (50000 UNIT) CAPS capsule Reorder     Meds ordered this encounter  Medications   Vitamin D, Ergocalciferol, (DRISDOL) 1.25 MG (50000 UNIT) CAPS capsule    Sig: Take 1 capsule (50,000 Units total) by mouth every 7 (seven) days.    Dispense:  4 capsule    Refill:  0    30 d supply;  ** OV for RF **   Do not send RF request     1. Vitamin D deficiency We will check labs today, and we will refill Ergo for 1 month.   - Vitamin D, Ergocalciferol, (DRISDOL) 1.25 MG (50000 UNIT) CAPS capsule; Take 1 capsule (50,000 Units total) by mouth every 7 (seven) days.  Dispense: 4 capsule; Refill: 0 - VITAMIN D 25 Hydroxy (Vit-D Deficiency, Fractures)  2. Prediabetes We will  check labs today. She will continue her prudent nutritional plan and weight loss.   - Hemoglobin A1c  3. Obesity, current BMI 34.9 [name] is currently in the action stage of change. As such, her goal is to continue with weight loss efforts. She has agreed to the Category 2 Plan with breakfast and lunch options.   Exercise goals: All adults should avoid inactivity. Some physical activity is better than none, and adults who participate in any amount of physical activity gain some health benefits.  Behavioral modification strategies: better snacking choices and avoiding temptations.  [name] has agreed to follow-up with our clinic in 4 weeks. She was informed of the importance of frequent follow-up visits to maximize her success with intensive lifestyle modifications for her multiple health conditions.   [name] was informed we would discuss her lab results at her next visit unless there is a critical issue that needs to be addressed sooner. [name] agreed to keep her next visit at the agreed upon time to discuss these results.  Objective:   Blood pressure 116/79, pulse 74, temperature 98.4 F (36.9 C), height 5' 4"$  (1.626 m), weight 203 lb 9.6 oz (92.4 kg), SpO2 98 %. Body mass index is 34.95 kg/m.  General: Cooperative, alert, well developed, in no acute distress. HEENT: Conjunctivae and lids unremarkable. Cardiovascular: Regular rhythm.  Lungs: Normal work of breathing. Neurologic: No focal deficits.   Lab Results  Component Value Date  CREATININE 0.75 07/17/2022   BUN 17 07/17/2022   NA 142 07/17/2022   K 4.5 07/17/2022   CL 105 07/17/2022   CO2 24 07/17/2022   Lab Results  Component Value Date   ALT 18 07/17/2022   AST 14 07/17/2022   ALKPHOS 58 07/17/2022   BILITOT 0.2 07/17/2022   Lab Results  Component Value Date   HGBA1C 5.1 01/03/2023   HGBA1C 5.4 07/17/2022   HGBA1C 5.9 (H) 03/08/2022   HGBA1C 6.3 07/05/2021   HGBA1C 5.3 03/18/2020   Lab Results   Component Value Date   INSULIN 16.5 07/17/2022   INSULIN 30.2 (H) 03/08/2022   Lab Results  Component Value Date   TSH 2.490 07/17/2022   Lab Results  Component Value Date   CHOL 141 07/17/2022   HDL 34 (L) 07/17/2022   LDLCALC 85 07/17/2022   LDLDIRECT 104.0 09/17/2017   TRIG 119 07/17/2022   CHOLHDL 3.5 03/08/2022   Lab Results  Component Value Date   VD25OH 32.8 01/03/2023   VD25OH 38.6 07/17/2022   VD25OH 37.0 03/08/2022   Lab Results  Component Value Date   WBC 8.2 07/17/2022   HGB 15.2 07/17/2022   HCT 44.8 07/17/2022   MCV 99 (H) 07/17/2022   PLT 318 07/17/2022   Lab Results  Component Value Date   IRON 147 (H) 02/06/2018   Attestation Statements:   Reviewed by clinician on day of visit: allergies, medications, problem list, medical history, surgical history, family history, social history, and previous encounter notes.   Wilhemena Durie, am acting as transcriptionist for [employer], DO.  I have reviewed the above documentation for accuracy and completeness, and I agree with the above. Marjory Sneddon, D.O.  The [place] was signed into law in 2016 which includes the topic of electronic health records.  This provides immediate access to information in MyChart.  This includes consultation notes, operative notes, office notes, lab results and pathology reports.  If you have any questions about what you read please let us know at your next visit so we can discuss your concerns and take corrective action if need be.  We are right here with you.

## 2023-01-29 DIAGNOSIS — J019 Acute sinusitis, unspecified: Secondary | ICD-10-CM | POA: Diagnosis not present

## 2023-01-29 DIAGNOSIS — R0982 Postnasal drip: Secondary | ICD-10-CM | POA: Diagnosis not present

## 2023-01-31 ENCOUNTER — Encounter: Payer: Self-pay | Admitting: Family Medicine

## 2023-02-07 ENCOUNTER — Ambulatory Visit (INDEPENDENT_AMBULATORY_CARE_PROVIDER_SITE_OTHER): Payer: 59 | Admitting: Family Medicine

## 2023-02-07 ENCOUNTER — Encounter (INDEPENDENT_AMBULATORY_CARE_PROVIDER_SITE_OTHER): Payer: Self-pay | Admitting: Family Medicine

## 2023-02-07 VITALS — BP 116/75 | HR 75 | Temp 98.3°F | Ht 64.0 in | Wt 202.0 lb

## 2023-02-07 DIAGNOSIS — Z6834 Body mass index (BMI) 34.0-34.9, adult: Secondary | ICD-10-CM

## 2023-02-07 DIAGNOSIS — R7303 Prediabetes: Secondary | ICD-10-CM | POA: Diagnosis not present

## 2023-02-07 DIAGNOSIS — E559 Vitamin D deficiency, unspecified: Secondary | ICD-10-CM

## 2023-02-07 MED ORDER — VITAMIN D (ERGOCALCIFEROL) 1.25 MG (50000 UNIT) PO CAPS: ORAL_CAPSULE | ORAL | 0 refills | Status: DC

## 2023-02-07 MED ORDER — VITAMIN K 100 MCG PO TABS: 100.0000 ug | ORAL_TABLET | Freq: Every day | ORAL | Status: DC

## 2023-02-14 ENCOUNTER — Other Ambulatory Visit: Payer: Self-pay | Admitting: Family Medicine

## 2023-02-21 NOTE — Progress Notes (Signed)
Chief Complaint:   OBESITY Alicia Cox is here to discuss her progress with her obesity treatment plan along with follow-up of her obesity related diagnoses. Alicia Cox is on the Category 2 Plan with breakfast and lunch options for supplements options and states she is following her eating plan approximately 95% of the time. Alicia Cox states she is not exercising.  Today's visit was #: 43 Starting weight: 252 lbs Starting date: 03/08/2022 Today's weight: 202 lbs Today's date: 02/07/2023 Total lbs lost to date: 50 lbs Total lbs lost since last in-office visit: 1 lb  Interim History: Patient has 5 days of the flu is still horrible.  Did not eat much during that time.  Subjective:   1. Pre-diabetes Discussed labs with patient today. Patient has some hunger after dinner and some sweet cravings.  2. Vitamin D deficiency Discussed labs with patient today. Patient not at goal currently.  Compliance is great.  Assessment/Plan:  No orders of the defined types were placed in this encounter.   Medications Discontinued During This Encounter  Medication Reason   Vitamin D, Ergocalciferol, (DRISDOL) 1.25 MG (50000 UNIT) CAPS capsule Reorder     Meds ordered this encounter  Medications   Vitamin D, Ergocalciferol, (DRISDOL) 1.25 MG (50000 UNIT) CAPS capsule    Sig: 1 po q wed and q sun    Dispense:  8 capsule    Refill:  0    30 d supply;  ** OV for RF **   Do not send RF request   vitamin k 100 MCG tablet    Sig: Take 1 tablet (100 mcg total) by mouth daily.     1. Pre-diabetes A1c improved.  Continue PNP and weight loss and make sure adequate protein intake at night/dinner.  Increase exercise.  Patient declines any medications.  2. Vitamin D deficiency Add 100 mcg of vitamin K daily.  Refill - Vitamin D, Ergocalciferol, (DRISDOL) 1.25 MG (50000 UNIT) CAPS capsule; 1 po q wed and q sun  Dispense: 8 capsule; Refill: 0  3. BMI 34.0-34.9,adult-current bmi 34.7  4. Morbid obesity  (HCC)-start bmi 43.26 Next office visit, repeat fasting IC and FLP and FI.  Last IC 03/09/2022.  Alicia Cox is currently in the action stage of change. As such, her goal is to continue with weight loss efforts. She has agreed to the Category 2 Plan with breakfast and lunch options.   Exercise goals:  Just move more.   Behavioral modification strategies: increasing lean protein intake, decreasing simple carbohydrates, and planning for success.  Alicia Cox has agreed to follow-up with our clinic in 4 weeks. She was informed of the importance of frequent follow-up visits to maximize her success with intensive lifestyle modifications for her multiple health conditions.   Objective:   Blood pressure 116/75, pulse 75, temperature 98.3 F (36.8 C), height '5\' 4"'$  (1.626 m), weight 202 lb (91.6 kg), SpO2 98 %. Body mass index is 34.67 kg/m.  General: Cooperative, alert, well developed, in no acute distress. HEENT: Conjunctivae and lids unremarkable. Cardiovascular: Regular rhythm.  Lungs: Normal work of breathing. Neurologic: No focal deficits.   Lab Results  Component Value Date   CREATININE 0.75 07/17/2022   BUN 17 07/17/2022   NA 142 07/17/2022   K 4.5 07/17/2022   CL 105 07/17/2022   CO2 24 07/17/2022   Lab Results  Component Value Date   ALT 18 07/17/2022   AST 14 07/17/2022   ALKPHOS 58 07/17/2022   BILITOT 0.2 07/17/2022  Lab Results  Component Value Date   HGBA1C 5.1 01/03/2023   HGBA1C 5.4 07/17/2022   HGBA1C 5.9 (H) 03/08/2022   HGBA1C 6.3 07/05/2021   HGBA1C 5.3 03/18/2020   Lab Results  Component Value Date   INSULIN 16.5 07/17/2022   INSULIN 30.2 (H) 03/08/2022   Lab Results  Component Value Date   TSH 2.490 07/17/2022   Lab Results  Component Value Date   CHOL 141 07/17/2022   HDL 34 (L) 07/17/2022   LDLCALC 85 07/17/2022   LDLDIRECT 104.0 09/17/2017   TRIG 119 07/17/2022   CHOLHDL 3.5 03/08/2022   Lab Results  Component Value Date   VD25OH 32.8  01/03/2023   VD25OH 38.6 07/17/2022   VD25OH 37.0 03/08/2022   Lab Results  Component Value Date   WBC 8.2 07/17/2022   HGB 15.2 07/17/2022   HCT 44.8 07/17/2022   MCV 99 (H) 07/17/2022   PLT 318 07/17/2022   Lab Results  Component Value Date   IRON 147 (H) 02/06/2018   Attestation Statements:   Reviewed by clinician on day of visit: allergies, medications, problem list, medical history, surgical history, family history, social history, and previous encounter notes.  I, Davy Pique, RMA, am acting as Location manager for [employer], DO.   I have reviewed the above documentation for accuracy and completeness, and I agree with the above. Marjory Sneddon, D.O.  The [place] was signed into law in 2016 which includes the topic of electronic health records.  This provides immediate access to information in MyChart.  This includes consultation notes, operative notes, office notes, lab results and pathology reports.  If you have any questions about what you read please let us know at your next visit so we can discuss your concerns and take corrective action if need be.  We are right here with you.

## 2023-03-09 NOTE — Progress Notes (Signed)
Subjective:   By signing my name below, I, Madelin Rear, attest that this documentation has been prepared under the direction and in the presence of Mosie Lukes, MD.  03/12/2023.     Patient ID: Alicia Cox, female    DOB: [birth year], 52 y.o.   MRN: [id number]  No chief complaint on file.   HPI Patient is in today for an office visit.     Past Medical History:  Diagnosis Date   Acid reflux    Acute bronchitis 11/03/2013   Allergy    hay fever   Anxiety    Arthritis    Asthma    Back pain    Blood in stool    Cervical cancer screening 09/14/2016   Menarche at 13 Regular and heavy flow no history of abnormal pap in past G2P2, s/p 2 svd No history of abnormal MGM No concerns today  gyn surgeries: tubal and endometrial ablation LMP 09/13/2016   Chest pain 08/24/2013   Chicken pox as a child   Depression    Dermatitis 03/02/2016   Frequent headaches    Gallbladder problem    GERD (gastroesophageal reflux disease)    Hashimoto's disease    Heart murmur    History of blood clots    Hypothyroidism    Hashimoto's   IBS (irritable bowel syndrome)    Joint pain    Knee pain, left 01/09/2014   Low back pain 08/26/2017   Migraine    Obesity 11/15/2014   Preventative health care 09/12/2015   Rheumatoid arthritis ([place])    Sinus complaint    SOBOE (shortness of breath on exertion)    TOS (thoracic outlet syndrome) 08/25/2013   UTI (urinary tract infection)    Vitamin D deficiency     Past Surgical History:  Procedure Laterality Date   ABLATION  2009   [place]  01/10/1993   CESAREAN SECTION  04/21/1998   CHOLECYSTECTOMY  2011   ENDOMETRIAL ABLATION     TONSILLECTOMY  1983   TUBAL LIGATION  1999    Family History  Problem Relation Age of Onset   Cancer Mother        skin cancer   Hyperlipidemia Mother    Hypertension Mother    Skin cancer Mother    Diabetes Father        type 2   Cancer Sister        BCC    Hypertension Sister    Diabetes Sister    Asthma Daughter    Heart disease Maternal Grandmother    Diabetes Maternal Grandmother    Heart attack Maternal Grandfather    Diabetes Maternal Grandfather    Diabetes Paternal Grandmother    Diabetes Paternal Grandfather     Social History   Socioeconomic History   Marital status: Married    Spouse name: Abbeygale Solomonson   Number of children: 2   Years of education: Not on file   Highest education level: Not on file  Occupational History   Occupation: Pharmacist, hospital at [employer]    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  Tobacco Use   Smoking status: Never   Smokeless tobacco: Never   Tobacco comments:    NEVER USED TOBACCO  Vaping Use   Vaping Use: Never used  Substance and Sexual Activity   Alcohol use: No    Alcohol/week: 0.0 standard drinks of alcohol   Drug use: No   Sexual activity: Yes  Partners: Male    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, no dietary restrictions, 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 18 g 5   aspirin 81 MG EC tablet Take by mouth.     buPROPion (WELLBUTRIN XL) 300 MG 24 hr tablet Take 1 tablet by mouth once daily 90 tablet 0   dicyclomine (BENTYL) 10 MG capsule Take 1 capsule (10 mg total) by mouth 3 (three) times daily as needed for spasms. 90 capsule 1   escitalopram (LEXAPRO) 10 MG tablet Take 1 tablet (10 mg total) by mouth daily. 90 tablet 1    fluticasone-salmeterol (ADVAIR) 250-50 MCG/ACT AEPB INHALE 1 PUFF INTO LUNGS IN THE MORNING AND AT BEDTIME 60 each 2   gabapentin (NEURONTIN) 300 MG capsule nightly 90 capsule 3   levothyroxine (SYNTHROID) 100 MCG tablet TAKE 1 TABLET BY MOUTH ONCE DAILY. EXCECPT ON THURSDAYS AND SUNDAYS TAKE 1 AND 1/2 TABLETS DAILY. 105 tablet 1   meloxicam (MOBIC) 7.5 MG tablet Take 1 tablet by mouth once daily 30 tablet 0   montelukast (SINGULAIR) 10 MG tablet Take 1 tablet (10 mg total) by mouth at bedtime. 90 tablet 1   omeprazole (PRILOSEC) 40 MG capsule Take 1 capsule (40 mg total) by mouth daily. 30 capsule 5   tiZANidine (ZANAFLEX) 4 MG tablet TAKE 1/2 TO 1 (ONE-HALF TO ONE) TABLET BY MOUTH EVERY 8 HOURS AS NEEDED FOR MUSCLE SPASM 40 tablet 0   Vitamin D, Ergocalciferol, (DRISDOL) 1.25 MG (50000 UNIT) CAPS capsule 1 po q wed and q sun 8 capsule 0   vitamin k 100 MCG tablet Take 1 tablet (100 mcg total) by mouth daily.     No facility-administered medications prior to visit.    No Known Allergies  ROS     Objective:    Physical Exam Constitutional:      Appearance: Normal appearance.  HENT:     Head: Normocephalic and atraumatic.     Right Ear: Tympanic membrane, ear canal and external ear normal.     Left Ear: Tympanic membrane, ear canal and external ear normal.  Eyes:     Extraocular Movements: Extraocular movements intact.     Pupils: Pupils are equal, round, and reactive to light.  Cardiovascular:     Rate and Rhythm: Normal rate and regular rhythm.     Heart sounds: Normal heart sounds. No murmur heard.    No gallop.  Pulmonary:     Effort: Pulmonary effort is normal. No respiratory distress.     Breath sounds: Normal breath sounds. No wheezing or rales.  Skin:    General: Skin is warm and dry.  Neurological:     General: No focal deficit present.     Mental Status: She is alert and oriented to person, place, and time.  Psychiatric:        Mood and Affect: Mood normal.  Behavior: Behavior normal.     There were no vitals taken for this visit. Wt Readings from Last 3 Encounters:  02/07/23 202 lb (91.6 kg)  01/03/23 203 lb 9.6 oz (92.4 kg)  12/06/22 200 lb 9.6 oz (91 kg)    Diabetic Foot Exam - Simple   No data filed    Lab Results  Component Value Date   WBC 8.2 07/17/2022   HGB 15.2 07/17/2022   HCT 44.8 07/17/2022   PLT 318 07/17/2022   GLUCOSE 108 (H) 07/17/2022   CHOL 141 07/17/2022   TRIG 119 07/17/2022   HDL 34 (L) 07/17/2022   LDLDIRECT 104.0 09/17/2017   LDLCALC 85 07/17/2022   ALT 18 07/17/2022   AST 14 07/17/2022   NA 142 07/17/2022   K 4.5 07/17/2022   CL 105 07/17/2022   CREATININE 0.75 07/17/2022   BUN 17 07/17/2022   CO2 24 07/17/2022   TSH 2.490 07/17/2022   HGBA1C 5.1 01/03/2023    Lab Results  Component Value Date   TSH 2.490 07/17/2022   Lab Results  Component Value Date   WBC 8.2 07/17/2022   HGB 15.2 07/17/2022   HCT 44.8 07/17/2022   MCV 99 (H) 07/17/2022   PLT 318 07/17/2022   Lab Results  Component Value Date   NA 142 07/17/2022   K 4.5 07/17/2022   CO2 24 07/17/2022   GLUCOSE 108 (H) 07/17/2022   BUN 17 07/17/2022   CREATININE 0.75 07/17/2022   BILITOT 0.2 07/17/2022   ALKPHOS 58 07/17/2022   AST 14 07/17/2022   ALT 18 07/17/2022   PROT 7.3 07/17/2022   ALBUMIN 4.4 07/17/2022   CALCIUM 9.3 07/17/2022   ANIONGAP 7 04/07/2020   EGFR 97 07/17/2022   GFR 96.62 07/05/2021   Lab Results  Component Value Date   CHOL 141 07/17/2022   Lab Results  Component Value Date   HDL 34 (L) 07/17/2022   Lab Results  Component Value Date   LDLCALC 85 07/17/2022   Lab Results  Component Value Date   TRIG 119 07/17/2022   Lab Results  Component Value Date   CHOLHDL 3.5 03/08/2022   Lab Results  Component Value Date   HGBA1C 5.1 01/03/2023       Assessment & Plan:   Problem List Items Addressed This Visit   None    No orders of the defined types were placed in this  encounter.   Alphonzo Grieve, personally preformed the services described in this documentation.  All medical record entries made by the scribe were at my direction and in my presence.  I have reviewed the chart and discharge instructions (if applicable) and agree that the record reflects my personal performance and is accurate and complete. 03/12/2023.  I,Mathew Stumpf,acting as a Education administrator for Penni Homans, MD.,have documented all relevant documentation on the behalf of Penni Homans, MD,as directed by  Penni Homans, MD while in the presence of Penni Homans, MD.   Madelin Rear

## 2023-03-11 NOTE — Assessment & Plan Note (Signed)
On Levothyroxine, continue to monitor 

## 2023-03-11 NOTE — Assessment & Plan Note (Signed)
hgba1c acceptable, minimize simple carbs. Increase exercise as tolerated.  

## 2023-03-11 NOTE — Assessment & Plan Note (Signed)
Supplement and monitor 

## 2023-03-11 NOTE — Assessment & Plan Note (Signed)
Encouraged DASH or MIND diet, decrease po intake and increase exercise as tolerated. Needs 7-8 hours of sleep nightly. Avoid trans fats, eat small, frequent meals every 4-5 hours with lean proteins, complex carbs and healthy fats. Minimize simple carbs, high fat foods and processed foods 

## 2023-03-11 NOTE — Assessment & Plan Note (Signed)
On wellbutrin and lexapro

## 2023-03-11 NOTE — Assessment & Plan Note (Signed)
Encourage heart healthy diet such as MIND or DASH diet, increase exercise, avoid trans fats, simple carbohydrates and processed foods, consider a krill or fish or flaxseed oil cap daily.  °

## 2023-03-12 ENCOUNTER — Ambulatory Visit: Payer: 59 | Admitting: Family Medicine

## 2023-03-12 ENCOUNTER — Encounter: Payer: Self-pay | Admitting: Family Medicine

## 2023-03-12 VITALS — BP 128/82 | HR 95 | Temp 98.0°F | Resp 16 | Ht 64.0 in | Wt 201.4 lb

## 2023-03-12 DIAGNOSIS — R7303 Prediabetes: Secondary | ICD-10-CM | POA: Diagnosis not present

## 2023-03-12 DIAGNOSIS — E782 Mixed hyperlipidemia: Secondary | ICD-10-CM | POA: Diagnosis not present

## 2023-03-12 DIAGNOSIS — F39 Unspecified mood [affective] disorder: Secondary | ICD-10-CM | POA: Diagnosis not present

## 2023-03-12 DIAGNOSIS — F411 Generalized anxiety disorder: Secondary | ICD-10-CM

## 2023-03-12 DIAGNOSIS — E063 Autoimmune thyroiditis: Secondary | ICD-10-CM | POA: Diagnosis not present

## 2023-03-12 DIAGNOSIS — E038 Other specified hypothyroidism: Secondary | ICD-10-CM | POA: Diagnosis not present

## 2023-03-12 DIAGNOSIS — E559 Vitamin D deficiency, unspecified: Secondary | ICD-10-CM

## 2023-03-12 DIAGNOSIS — Z6834 Body mass index (BMI) 34.0-34.9, adult: Secondary | ICD-10-CM | POA: Diagnosis not present

## 2023-03-12 MED ORDER — ALBUTEROL SULFATE HFA 108 (90 BASE) MCG/ACT IN AERS: 2.0000 | INHALATION_SPRAY | Freq: Four times a day (QID) | RESPIRATORY_TRACT | 5 refills | Status: DC | PRN

## 2023-03-12 NOTE — Patient Instructions (Signed)
Shingrix is the new shingles shot, 2 shots over 2-6 months, confirm coverage with insurance and document, then can return here for shots with nurse appt or at pharmacy 

## 2023-03-12 NOTE — Assessment & Plan Note (Signed)
She is managing stress well on current meds and will stay on Bupropion and Escitalopram for now. If she is ready to titrate down the Bupropion

## 2023-03-12 NOTE — Assessment & Plan Note (Signed)
Working with [facility] and has had great results. She sees them tomorrow and will have her labs drawn then.

## 2023-03-13 ENCOUNTER — Ambulatory Visit (INDEPENDENT_AMBULATORY_CARE_PROVIDER_SITE_OTHER): Payer: 59 | Admitting: Family Medicine

## 2023-03-13 ENCOUNTER — Encounter (INDEPENDENT_AMBULATORY_CARE_PROVIDER_SITE_OTHER): Payer: Self-pay | Admitting: Family Medicine

## 2023-03-13 VITALS — BP 117/84 | HR 77 | Temp 97.6°F | Ht 64.0 in | Wt 198.8 lb

## 2023-03-13 DIAGNOSIS — E063 Autoimmune thyroiditis: Secondary | ICD-10-CM | POA: Diagnosis not present

## 2023-03-13 DIAGNOSIS — E038 Other specified hypothyroidism: Secondary | ICD-10-CM | POA: Diagnosis not present

## 2023-03-13 DIAGNOSIS — R7303 Prediabetes: Secondary | ICD-10-CM | POA: Diagnosis not present

## 2023-03-13 DIAGNOSIS — E559 Vitamin D deficiency, unspecified: Secondary | ICD-10-CM | POA: Diagnosis not present

## 2023-03-13 DIAGNOSIS — J302 Other seasonal allergic rhinitis: Secondary | ICD-10-CM | POA: Diagnosis not present

## 2023-03-13 DIAGNOSIS — Z6834 Body mass index (BMI) 34.0-34.9, adult: Secondary | ICD-10-CM

## 2023-03-13 DIAGNOSIS — E782 Mixed hyperlipidemia: Secondary | ICD-10-CM

## 2023-03-13 DIAGNOSIS — R0602 Shortness of breath: Secondary | ICD-10-CM | POA: Insufficient documentation

## 2023-03-13 MED ORDER — VITAMIN D (ERGOCALCIFEROL) 1.25 MG (50000 UNIT) PO CAPS: ORAL_CAPSULE | ORAL | 0 refills | Status: DC

## 2023-03-13 NOTE — Progress Notes (Signed)
Marjory Sneddon, D.O.  ABFM, ABOM Specializing in Clinical Bariatric Medicine  Office located at: [address],   [address]     Assessment and Plan:   Orders Placed This Encounter  Procedures   Lipid Panel With LDL/HDL Ratio   Insulin, random   TSH   T4, free    Medications Discontinued During This Encounter  Medication Reason   Vitamin D, Ergocalciferol, (DRISDOL) 1.25 MG (50000 UNIT) CAPS capsule Reorder     Meds ordered this encounter  Medications   Vitamin D, Ergocalciferol, (DRISDOL) 1.25 MG (50000 UNIT) CAPS capsule    Sig: 1 po q wed and q sun    Dispense:  8 capsule    Refill:  0    30 d supply;  ** OV for RF **   Do not send RF request     Prediabetes Assessment: Condition is Not optimized.  Lab Results  Component Value Date   HGBA1C 5.1 01/03/2023   HGBA1C 5.4 07/17/2022   HGBA1C 5.9 (H) 03/08/2022   INSULIN 16.5 07/17/2022   INSULIN 30.2 (H) 03/08/2022  She is not currently on any medication for her prediabetes. This is diet controlled. She endorses that sometimes she gets hungry 3-5 days per month even though she is no longer on her menses. During these days that her hunger increases, she eat something healthy like apples, fruits, or yogurt.  Plan: Will Recheck labs today.  - I recommended that when her hunger increases to have protein along with the apples, fruits, and yogurt. She declined any medication for the cravings/hunger todat, she will focus on eating enough protein for now.  -Continue her prudent nutritional plan and continue to advance exercise and cardiovascular fitness as tolerated.    Vitamin D deficiency Assessment: Condition is Not optimized.  Lab Results  Component Value Date   VD25OH 32.8 01/03/2023   VD25OH 38.6 07/17/2022   VD25OH 37.0 03/08/2022  She reports better compliance with Ergocalciferol 50K IU twice  weekly and OTC Vitamin K 100 mcg daily. Denies any side effects.   Plan:  Will refill Vitamin  D today.   Continue with Ergocalciferol 50K IU weekly twice weekly and OTC Vitamin K 100 mcg daily.  - weight loss will likely improve availability of vitamin D, thus encouraged Noor to continue with meal plan and their weight loss efforts to further improve this condition.  Thus, we will need to monitor levels regularly (every 3-4 mo on average) to keep levels within normal limits and prevent over supplementation.   Hyperlipidemia, mixed  Assessment: Condition is Not optimized.  Lab Results  Component Value Date   CHOL 141 07/17/2022   HDL 34 (L) 07/17/2022   LDLCALC 85 07/17/2022   LDLDIRECT 104.0 09/17/2017   TRIG 119 07/17/2022   CHOLHDL 3.5 03/08/2022  She is not currently taking any medication for the Hyperlipidemia. This is diet controlled.   Plan: Recheck labs today.  - Continue her prudent nutritional plan and continue to advance exercise and cardiovascular fitness as tolerated.  - We will continue routine screening as patient continues to achieve health goals along their weight loss journey   SOB (shortness of breath) on exertion Assessment: Condition is Improving, but not optimized. Charnika does feel that she gets out of breath more easily when she walks up multiple steps on the stairs. Bell's shortness of breath appears to be obesity related and exercise induced, as they do not appear to have any "red flag" symptoms/  concerns today.   On 03/09/22, her ICC was 1901 Today her V02 is 254, REE is 1757, and calculated metabolic rate is [id number]. Her metabolism is better than anticipated.   Plan: Counseling done with patient regarding repeat I.C. results.  It is better than expected especially with patient's recent large weight loss.  Reviewed aspects of diet and lifestyle today with patient that will increase metabolism.  All questions addressed.  Continue to focus on lean proteins, adequate nutrition and advancing cardiovascular and resistance training as tolerated.  Recheck  labs today. Patient agreed to continue on weight loss at this time.  As Pattyann progresses through our weight loss program, we will gradually increase exercise as tolerated to treat her current condition.  If Engracia follows our recommendations and loses 5-10% of their weight without improvement of her shortness of breath or if at any time, symptoms become more concerning, they agree to urgently follow up with their PCP/ specialist for further consideration/ evaluation.   Rozelia verbalizes agreement with this plan.     Season Allergies  Assessment: Condition is stable  Patient is compliant with Singulair 10 mg daily and Zrytec. Denies any side effects.  Plan: Continue with Singular 10 mg daily and Zrytec as recommended by PCP I also recommended her to use sterile nasal sinus rinses once in the morning and once at night and to rinse her face after being outside for prolonged periods. I also recommended her to use spray Flonase in each nostril (once in the morning and once at night) for seasonal allergy relief.   Hypothyroidism due to Hashimoto's thyroiditis Assessment: Condition is stable.  She reports good compliance and tolerance with Synthroid 100 mcg daily. Denies any side effects.  Plan: Continue with Synthroid 100 mcg daily as recommended by PCP. Continue her prudent nutritional plan.   TREATMENT PLAN FOR OBESITY: BMI 34.0-34.9,adult-current bmi 34.1 Morbid obesity (HCC)-start bmi 43.26/date 03/08/22 Assessment: Condition is Improving, but not optimized.. Biometric data collected today, was reviewed with patient.  Fat mass has increased by 5.6lb. Muscle mass has decreased by 13.4lb. Total body water has decreased by 1lb.   Plan: Continue with  the Category 2 Plan with breakfast and lunch calories and with only 100 snack calories.   Behavioral Intervention Additional resources provided today: patient declined Evidence-based interventions for health behavior change were utilized today  including the discussion of self monitoring techniques, problem-solving barriers and SMART goal setting techniques.   Regarding patient's less desirable eating habits and patterns, we employed the technique of small changes.  Pt will specifically work on: continue exercise and increase as tolerated for next visit.    Recommended Physical Activity Goals Jayliana has been advised to work up to 150 minutes of moderate intensity aerobic activity a week and strengthening exercises 2-3 times per week for cardiovascular health, weight loss maintenance and preservation of muscle mass.  She has agreed to Think about ways to increase physical activity  FOLLOW UP: Return in about 5 weeks (around 04/17/2023). She was informed of the importance of frequent follow up visits to maximize her success with intensive lifestyle modifications for her multiple health conditions.  LAMYIAH OMOTO is aware that we will review all of her lab results at our next visit.  She is aware that if anything is critical/ life threatening with the results, we will be contacting her via [place] prior to the office visit to discuss management.    Subjective:   Chief complaint: Obesity Cailani is here to discuss her  progress with her obesity treatment plan. She is on the the Category 2 Plan with breakfast and lunch options and states she is following her eating plan approximately 98% of the time. She states she is exercising 20 minutes 3 days per week.  Interval History:  WINDY COS is here for a follow up office visit. Since the last office visit she increased the amount she walks per week and is mostly following her meal plan. For snacks she typically has an 80 calorie yogurt. She tries to aim for 7 hrs of sleep per night, but usually gets 6 hrs.   We reviewed her meal plan and all questions were answered. Patient's food recall appears to be accurate and consistent with what is on plan when she is following it. When eating  on plan, her hunger and cravings are well controlled.     Review of Systems:  Pertinent positives were addressed with patient today.  Weight Summary and Biometrics   Weight Lost Since Last Visit: 4lb  No data recorded   Vitals Temp: 97.6 F (36.4 C) BP: 117/84 Pulse Rate: 77 SpO2: 97 %   Anthropometric Measurements Height: 5\' 4"  (1.626 m) Weight: 198 lb 12.8 oz (90.2 kg) BMI (Calculated): 34.11 Weight at Last Visit: 202lb Weight Lost Since Last Visit: 4lb Starting Weight: 252lb Total Weight Loss (lbs): 54 lb (24.5 kg) Peak Weight: 272lb   Body Composition  Body Fat %: 45.3 % Fat Mass (lbs): 90 lbs Muscle Mass (lbs): 103.2 lbs Total Body Water (lbs): 72.8 lbs Visceral Fat Rating : 12   Other Clinical Data A1c: 5.1 mg/dL (01/03/2023) RMR: 1757 Fasting: yes Labs: yes Today's Visit #: 18 Starting Date: 03/08/22    Objective:   PHYSICAL EXAM:  Blood pressure 117/84, pulse 77, temperature 97.6 F (36.4 C), height 5\' 4"  (1.626 m), weight 198 lb 12.8 oz (90.2 kg), SpO2 97 %. Body mass index is 34.12 kg/m.  General: Well Developed, well nourished, and in no acute distress.  HEENT: Normocephalic, atraumatic Skin: Warm and dry, cap RF less 2 sec, good turgor Chest:  Normal excursion, shape, no gross abn Respiratory: speaking in full sentences, no conversational dyspnea NeuroM-Sk: Ambulates w/o assistance, moves * 4 Psych: A and O *3, insight good, mood-full  DIAGNOSTIC DATA REVIEWED:  BMET    Component Value Date/Time   NA 142 07/17/2022 1144   K 4.5 07/17/2022 1144   CL 105 07/17/2022 1144   CO2 24 07/17/2022 1144   GLUCOSE 108 (H) 07/17/2022 1144   GLUCOSE 98 07/05/2021 1600   BUN 17 07/17/2022 1144   CREATININE 0.75 07/17/2022 1144   CREATININE 0.69 04/07/2020 1428   CREATININE 0.70 03/18/2020 1529   CALCIUM 9.3 07/17/2022 1144   GFRNONAA >60 04/07/2020 1428   GFRAA >60 04/07/2020 1428   Lab Results  Component Value Date   HGBA1C 5.1  01/03/2023   HGBA1C 6.0 (H) 08/22/2013   Lab Results  Component Value Date   INSULIN 16.5 07/17/2022   INSULIN 30.2 (H) 03/08/2022   Lab Results  Component Value Date   TSH 2.490 07/17/2022   CBC    Component Value Date/Time   WBC 8.2 07/17/2022 1144   WBC 10.8 (H) 07/05/2021 1600   RBC 4.51 07/17/2022 1144   RBC 4.41 07/05/2021 1600   HGB 15.2 07/17/2022 1144   HGB 14.7 05/12/2015 0921   HCT 44.8 07/17/2022 1144   HCT 42.8 05/12/2015 0921   PLT 318 07/17/2022 1144   MCV 99 (H)  07/17/2022 1144   MCV 97 05/12/2015 0921   MCH 33.7 (H) 07/17/2022 1144   MCH 32.5 04/07/2020 1428   MCHC 33.9 07/17/2022 1144   MCHC 34.2 07/05/2021 1600   RDW 11.5 (L) 07/17/2022 1144   RDW 12.2 05/12/2015 0921   Iron Studies    Component Value Date/Time   IRON 147 (H) 02/06/2018 1330   IRONPCTSAT 44.1 02/06/2018 1330   Lipid Panel     Component Value Date/Time   CHOL 141 07/17/2022 1144   TRIG 119 07/17/2022 1144   HDL 34 (L) 07/17/2022 1144   CHOLHDL 3.5 03/08/2022 1054   CHOLHDL 4 07/05/2021 1600   VLDL 33.0 07/05/2021 1600   LDLCALC 85 07/17/2022 1144   LDLCALC 108 (H) 03/18/2020 1529   LDLDIRECT 104.0 09/17/2017 1527   Hepatic Function Panel     Component Value Date/Time   PROT 7.3 07/17/2022 1144   ALBUMIN 4.4 07/17/2022 1144   AST 14 07/17/2022 1144   AST 14 (L) 04/07/2020 1428   ALT 18 07/17/2022 1144   ALT 19 04/07/2020 1428   ALKPHOS 58 07/17/2022 1144   BILITOT 0.2 07/17/2022 1144   BILITOT 0.3 04/07/2020 1428   BILIDIR 0.1 08/14/2014 1029   IBILI 0.3 08/14/2014 1029      Component Value Date/Time   TSH 2.490 07/17/2022 1144   Nutritional Lab Results  Component Value Date   VD25OH 32.8 01/03/2023   VD25OH 38.6 07/17/2022   VD25OH 37.0 03/08/2022    Attestations:   Reviewed by clinician on day of visit: allergies, medications, problem list, medical history, surgical history, family history, social history, and previous encounter notes.   This  encounter took 40 total minutes of time including any pre-visit and post-visit time spent on this date of service only, including taking a thorough history, reviewing any labs and/or imaging, reviewing prior notes, counseling the patient, coordinating care as well as documenting in the electronic health record on the date of service.   I,Special Puri,acting as a Education administrator for [employer], DO.,have documented all relevant documentation on the behalf of Mellody Dance, DO,as directed by  Mellody Dance, DO while in the presence of Mellody Dance, DO.   I, Mellody Dance, DO, have reviewed all documentation for this visit. The documentation on 03/13/23 for the exam, diagnosis, procedures, and orders are all accurate and complete.

## 2023-03-14 LAB — LIPID PANEL WITH LDL/HDL RATIO
Cholesterol, Total: 136 mg/dL (ref 100–199)
HDL: 46 mg/dL (ref >39)
LDL Chol Calc (NIH): 72 mg/dL (ref 0–99)
LDL/HDL Ratio: 1.6 ratio (ref 0.0–3.2)
Triglycerides: 98 mg/dL (ref 0–149)
VLDL Cholesterol Cal: 18 mg/dL (ref 5–40)

## 2023-03-14 LAB — T4, FREE: Free T4: 1.27 ng/dL (ref 0.82–1.77)

## 2023-03-14 LAB — TSH: TSH: 3.42 u[IU]/mL (ref 0.450–4.500)

## 2023-03-14 LAB — INSULIN, RANDOM: INSULIN: 14.3 u[IU]/mL (ref 2.6–24.9)

## 2023-03-15 ENCOUNTER — Other Ambulatory Visit: Payer: Self-pay | Admitting: Internal Medicine

## 2023-03-20 ENCOUNTER — Ambulatory Visit (INDEPENDENT_AMBULATORY_CARE_PROVIDER_SITE_OTHER): Payer: 59 | Admitting: Family Medicine

## 2023-04-12 ENCOUNTER — Other Ambulatory Visit: Payer: Self-pay | Admitting: Family Medicine

## 2023-04-25 ENCOUNTER — Ambulatory Visit (INDEPENDENT_AMBULATORY_CARE_PROVIDER_SITE_OTHER): Payer: 59 | Admitting: Family Medicine

## 2023-05-01 ENCOUNTER — Other Ambulatory Visit: Payer: Self-pay | Admitting: Family Medicine

## 2023-05-01 DIAGNOSIS — M542 Cervicalgia: Secondary | ICD-10-CM

## 2023-05-01 DIAGNOSIS — M79622 Pain in left upper arm: Secondary | ICD-10-CM

## 2023-05-09 ENCOUNTER — Encounter: Payer: Self-pay | Admitting: Internal Medicine

## 2023-05-09 ENCOUNTER — Ambulatory Visit: Payer: 59 | Admitting: Internal Medicine

## 2023-05-09 VITALS — BP 90/70 | HR 85 | Ht 64.0 in | Wt 208.2 lb

## 2023-05-09 DIAGNOSIS — J301 Allergic rhinitis due to pollen: Secondary | ICD-10-CM | POA: Diagnosis not present

## 2023-05-09 DIAGNOSIS — J4530 Mild persistent asthma, uncomplicated: Secondary | ICD-10-CM

## 2023-05-09 MED ORDER — AZELASTINE HCL 0.1 % NA SOLN: 1.0000 | Freq: Every day | NASAL | 12 refills | Status: AC

## 2023-05-09 NOTE — Patient Instructions (Addendum)
Please schedule follow up scheduled with myself in 1 year.  If my schedule is not open yet, we will contact you with a reminder closer to that time. Please call [phone] if you haven't heard from [place] a month before.   Continue the advair but you can drop down to once daily for now.   Eventually you can start taking as needed.  Because you do not have daily symptoms, using this inhaler on an as needed basis may be sufficient.   I recommend starting to use the inhaler daily as prescribed if you have symptoms of asthma such as chest tightness, wheezing, coughing, shortness of breath. You should also start using it if you have exposure to a sick contact, worsening allergies, or any other trigger for your asthma. I recommend you keep using it even after your respiratory symptoms resolve for 3-4 days. The goal of this therapy is to prevent your symptoms from becoming a flare severe enough to require steroids like prednisone.   Please call our office if using this inhaler on an as needed basis is not sufficient. You might need to be seen sooner than our scheduled follow up.   I am adding astelin nasal spray.   Astelin (am) Flonase (pm) - 1 spray on each side of your nose twice a day for first week, then 1 spray on each side.   Instructions for use: If you also use a saline nasal spray or rinse, use that first. Position the head with the chin slightly tucked. Use the right hand to spray into the left nostril and the right hand to spray into the left nostril.   Point the bottle away from the septum of your nose (cartilage that divides the two sides of your nose).  Hold the nostril closed on the opposite side from where you will spray Spray once and gently sniff to pull the medicine into the higher parts of your nose.  Don't sniff too hard as the medicine will drain down the back of your throat instead. Repeat with a second spray on the same side if prescribed. Repeat on the other side of your  nose.

## 2023-05-09 NOTE — Progress Notes (Signed)
Alicia Cox    [id number]    1971/11/24  Primary Care Physician:Blyth, Bryon Lions, MD Date of Appointment: 05/09/2023 Established Patient Visit  Chief complaint:   Chief Complaint  Patient presents with   Follow-up    12 months f/up     HPI: Alicia Cox is a 52 y.o. woman with asthma, well controlled.   Interval Updates: Here for one year follow up. On advair 250 2 puffs twice daily. No albuterol use.   Had the flu in the winter and it was much milder than previous years which she attributes to regular ics use.   No steroids, no abx.   On flonase bid and saline spray and zyrtec bid and singulair. Still having allergic rhinitis symptoms.   Has lost 70 lbs through [facility].  I have reviewed the patient's family social and past medical history and updated as appropriate.   Past Medical History:  Diagnosis Date   Acid reflux    Acute bronchitis 11/03/2013   Allergy    hay fever   Anxiety    Arthritis    Asthma    Back pain    Blood in stool    Cervical cancer screening 09/14/2016   Menarche at 13 Regular and heavy flow no history of abnormal pap in past G2P2, s/p 2 svd No history of abnormal MGM No concerns today  gyn surgeries: tubal and endometrial ablation LMP 09/13/2016   Chest pain 08/24/2013   Chicken pox as a child   Depression    Dermatitis 03/02/2016   Frequent headaches    Gallbladder problem    GERD (gastroesophageal reflux disease)    Hashimoto's disease    Heart murmur    History of blood clots    Hypothyroidism    Hashimoto's   IBS (irritable bowel syndrome)    Joint pain    Knee pain, left 01/09/2014   Low back pain 08/26/2017   Migraine    Obesity 11/15/2014   Preventative health care 09/12/2015   Rheumatoid arthritis (HCC)    Sinus complaint    SOBOE (shortness of breath on exertion)    TOS (thoracic outlet syndrome) 08/25/2013   UTI (urinary tract infection)    Vitamin D deficiency      Past Surgical History:  Procedure Laterality Date   ABLATION  2009   APPENDECTOMY  1994   CESAREAN SECTION  01/10/1993   CESAREAN SECTION  04/21/1998   CHOLECYSTECTOMY  2011   ENDOMETRIAL ABLATION     TONSILLECTOMY  1983   TUBAL LIGATION  1999    Family History  Problem Relation Age of Onset   Cancer Mother        skin cancer   Hyperlipidemia Mother    Hypertension Mother    Skin cancer Mother    Diabetes Father        type 2   Cancer Sister        BCC   Hypertension Sister    Diabetes Sister    Asthma Daughter    Heart disease Maternal Grandmother    Diabetes Maternal Grandmother    Heart attack Maternal Grandfather    Diabetes Maternal Grandfather    Diabetes Paternal Grandmother    Diabetes Paternal Grandfather     Social History   Occupational History   Occupation: Runner, broadcasting/film/video at [employer]    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  Gen:      No acute distress ENT:  no nasal polyps, mucus membranes moist Lungs:    No increased respiratory effort, symmetric chest wall excursion, clear to auscultation bilaterally, no wheezes or crackles CV:         Regular rate and rhythm; no murmurs, rubs, or gallops.  No pedal edema   Data Reviewed: Imaging: I have personally reviewed the chest xray March 2023 - no acute process  PFTs:      No data to display         I have personally reviewed the patient's PFTs and normal spirometry FeNO 6 ppb  Labs: Lab  Results  Component Value Date   WBC 8.2 07/17/2022   HGB 15.2 07/17/2022   HCT 44.8 07/17/2022   MCV 99 (H) 07/17/2022   PLT 318 07/17/2022   Lab Results  Component Value Date   NA 142 07/17/2022   K 4.5 07/17/2022   CL 105 07/17/2022   CO2 24 07/17/2022    Immunization status: Immunization History  Administered Date(s) Administered   Influenza,inj,Quad PF,6+ Mos 10/21/2013, 08/14/2014, 09/02/2015, 09/14/2016, 09/17/2017, 09/26/2019   Influenza-Unspecified 09/24/2018   PFIZER(Purple Top)SARS-COV-2 Vaccination 07/30/2020, 08/20/2020   Tdap 10/21/2013    External Records Personally Reviewed:   Assessment:  Mild persistent asthma, controlled Allergic rhinitis not well controlled  Plan/Recommendations:   Continue the advair but you can drop down to once daily for now.   Eventually you can start taking as needed.  Because you do not have daily symptoms, using this inhaler on an as needed basis may be sufficient.   I am adding astelin nasal spray with other allergy meds  Return to Care: Return in about 1 year (around 05/08/2024).   Durel Salts, MD Pulmonary and Critical Care Medicine [facility] [phone]

## 2023-05-13 ENCOUNTER — Other Ambulatory Visit: Payer: Self-pay | Admitting: Family Medicine

## 2023-05-21 ENCOUNTER — Telehealth (INDEPENDENT_AMBULATORY_CARE_PROVIDER_SITE_OTHER): Payer: 59 | Admitting: Family Medicine

## 2023-05-21 ENCOUNTER — Other Ambulatory Visit: Payer: Self-pay | Admitting: Family Medicine

## 2023-05-21 DIAGNOSIS — F39 Unspecified mood [affective] disorder: Secondary | ICD-10-CM

## 2023-05-21 DIAGNOSIS — E559 Vitamin D deficiency, unspecified: Secondary | ICD-10-CM | POA: Diagnosis not present

## 2023-05-21 DIAGNOSIS — Z6835 Body mass index (BMI) 35.0-35.9, adult: Secondary | ICD-10-CM | POA: Diagnosis not present

## 2023-05-21 DIAGNOSIS — R7303 Prediabetes: Secondary | ICD-10-CM | POA: Diagnosis not present

## 2023-05-21 DIAGNOSIS — Z6834 Body mass index (BMI) 34.0-34.9, adult: Secondary | ICD-10-CM

## 2023-05-21 MED ORDER — VITAMIN D (ERGOCALCIFEROL) 1.25 MG (50000 UNIT) PO CAPS
ORAL_CAPSULE | ORAL | 0 refills | Status: DC
Start: 2023-05-21 — End: 2024-03-07

## 2023-05-21 NOTE — Progress Notes (Signed)
[name], D.O.  ABFM, ABOM Specializing in Clinical Bariatric Medicine  Office located at: [address] [place], [place]  [address]    I connected with Alicia Cox on 05/21/23 at  2:40 PM EDT by video visit and verified that I am speaking with the correct person using two identifiers.    I discussed the limitations, risks, security and privacy concerns of performing an evaluation and management service by telemedicine and the availability of in-person appointments. The patient expressed understanding and agreed to proceed.   Other persons participating in the visit and their role in the encounter: medical scribe   Patient's location: home  Provider's location: clinic office    Assessment and Plan:   Medications Discontinued During This Encounter  Medication Reason   Vitamin D, Ergocalciferol, (DRISDOL) 1.25 MG (50000 UNIT) CAPS capsule Reorder     Meds ordered this encounter  Medications   Vitamin D, Ergocalciferol, (DRISDOL) 1.25 MG (50000 UNIT) CAPS capsule    Sig: 1 po q wed and q sun    Dispense:  8 capsule    Refill:  0    30 d supply;  ** OV for RF **   Do not send RF request     Check Vitamin D next OV   Vitamin D deficiency Assessment: Condition is not optimized.  Lab Results  Component Value Date   VD25OH 32.8 01/03/2023   VD25OH 38.6 07/17/2022   VD25OH 37.0 03/08/2022  - Pt endorses that she has not been taking her Ergocalciferol 50K IU twice weekly consistently. She typically takes the first tablet, but often forgets the 2nd one.   Plan: - Continue with OTC supplement. Will reorder this today.   - weight loss will likely improve availability of vitamin D, thus encouraged Lidie to continue with meal plan and their weight loss efforts to further improve this condition.     Prediabetes Assessment: Condition is improving, but not optimized.   Lab Results  Component Value Date   HGBA1C 5.1 01/03/2023   HGBA1C 5.4 07/17/2022    HGBA1C 5.9 (H) 03/08/2022   INSULIN 14.3 03/13/2023   INSULIN 16.5 07/17/2022   INSULIN 30.2 (H) 03/08/2022  - This is a diet controlled condition. Pt is not any prediabetic medication.   Plan  - Continue to decrease simple carbs/ sugars; increase fiber and proteins -> follow her meal plan.   Caileen Salas will continue to work on weight loss, exercise, via their meal plan we devised to help decrease the risk of progressing to diabetes.    Mood disorder (HCC)-emotional eating Assessment: Condition is not optimized. - Denies any SI/HI. Mood is stable today.  - Pt endorses that work has been very stressful and that she has been doing emotional eating.   Plan:  - I highly encouraged pt to meet with Dr. Dewaine Conger, our Bariatric Psychologist, who can help Enisa with her current struggles with emotional eating. Patient may consider meeting with Dr.Barker in the future, declines today.  - Reminded patient of the importance of following their prudent nutrition plan and how food can affect mood as well to support emotional wellbeing.  - We will continue to monitor closely.   TREATMENT PLAN FOR OBESITY: BMI 34.0-34.9,adult-current bmi 35.72 Morbid obesity (HCC)-start bmi 43.26/date 03/08/22 Assessment:  KEYMYA QUINLAN is here to discuss her progress with her obesity treatment plan along with follow-up of her obesity related diagnoses. See Medical Weight Management Flowsheet for complete bioelectrical impedance results.  Condition is not optimized. Biometric data not collected because this was a video visit.   Plan:  -  Continue with the Category 2 Plan with breakfast and lunch calories and with only 100 snack calories.   Behavioral Intervention Additional resources provided today: patient declined Evidence-based interventions for health behavior change were utilized today including the discussion of self monitoring techniques, problem-solving barriers and SMART goal setting techniques.    Regarding patient's less desirable eating habits and patterns, we employed the technique of small changes.  Pt will specifically work on: continue adherence to prudent nutritional plan  for next visit.    Recommended Physical Activity Goals  Cricket has been advised to slowly work up to 150 minutes of moderate intensity aerobic activity a week and strengthening exercises 2-3 times per week for cardiovascular health, weight loss maintenance and preservation of muscle mass.   She has agreed to Continue current level of physical activity   FOLLOW UP: Return in about 3 weeks (around 06/11/2023). She was informed of the importance of frequent follow up visits to maximize her success with intensive lifestyle modifications for her multiple health conditions.   Subjective:   Chief complaint: Obesity Naporsha is here to discuss her progress with her obesity treatment plan. She is on the Category 2 Plan. She states she is not exercising.   Interval History:  CARALEIGH EAKLE is here for a follow up office visit.     Since last office visit:   - Pt endorses that work has been very stressful and has been doing emotional eating.  - She was also out of town for a few days and endorses eating higher caloric foods on those days.  Lurena Joiner also reports having loose stools yesterday and today. Her symptoms are improving. She is currently eating simple/bland foods, such as dry toast, eggs, and plain chicken.    Review of Systems:  Pertinent positives were addressed with patient today.  Weight Summary and Biometrics   No data recorded No data recorded   No data recorded No data recorded No data recorded No data recorded   Objective:   PHYSICAL EXAM: There were no vitals taken for this visit. There is no height or weight on file to calculate BMI.  General: Well Developed, well nourished, and in no acute distress.  HEENT: Normocephalic, atraumatic Skin: Warm and dry, cap RF less 2 sec,  good turgor Chest:  Normal excursion, shape, no gross abn Respiratory: speaking in full sentences, no conversational dyspnea NeuroM-Sk: Ambulates w/o assistance, moves * 4 Psych: A and O *3, insight good, mood-full  DIAGNOSTIC DATA REVIEWED:  BMET    Component Value Date/Time   NA 142 07/17/2022 1144   K 4.5 07/17/2022 1144   CL 105 07/17/2022 1144   CO2 24 07/17/2022 1144   GLUCOSE 108 (H) 07/17/2022 1144   GLUCOSE 98 07/05/2021 1600   BUN 17 07/17/2022 1144   CREATININE 0.75 07/17/2022 1144   CREATININE 0.69 04/07/2020 1428   CREATININE 0.70 03/18/2020 1529   CALCIUM 9.3 07/17/2022 1144   GFRNONAA >60 04/07/2020 1428   GFRAA >60 04/07/2020 1428   Lab Results  Component Value Date   HGBA1C 5.1 01/03/2023   HGBA1C 6.0 (H) 08/22/2013   Lab Results  Component Value Date   INSULIN 14.3 03/13/2023   INSULIN 30.2 (H) 03/08/2022   Lab Results  Component Value Date   TSH 3.420 03/13/2023   CBC    Component Value Date/Time   WBC 8.2 07/17/2022  1144   WBC 10.8 (H) 07/05/2021 1600   RBC 4.51 07/17/2022 1144   RBC 4.41 07/05/2021 1600   HGB 15.2 07/17/2022 1144   HGB 14.7 05/12/2015 0921   HCT 44.8 07/17/2022 1144   HCT 42.8 05/12/2015 0921   PLT 318 07/17/2022 1144   MCV 99 (H) 07/17/2022 1144   MCV 97 05/12/2015 0921   MCH 33.7 (H) 07/17/2022 1144   MCH 32.5 04/07/2020 1428   MCHC 33.9 07/17/2022 1144   MCHC 34.2 07/05/2021 1600   RDW 11.5 (L) 07/17/2022 1144   RDW 12.2 05/12/2015 0921   Iron Studies    Component Value Date/Time   IRON 147 (H) 02/06/2018 1330   IRONPCTSAT 44.1 02/06/2018 1330   Lipid Panel     Component Value Date/Time   CHOL 136 03/13/2023 1100   TRIG 98 03/13/2023 1100   HDL 46 03/13/2023 1100   CHOLHDL 3.5 03/08/2022 1054   CHOLHDL 4 07/05/2021 1600   VLDL 33.0 07/05/2021 1600   LDLCALC 72 03/13/2023 1100   LDLCALC 108 (H) 03/18/2020 1529   LDLDIRECT 104.0 09/17/2017 1527   Hepatic Function Panel     Component Value Date/Time    PROT 7.3 07/17/2022 1144   ALBUMIN 4.4 07/17/2022 1144   AST 14 07/17/2022 1144   AST 14 (L) 04/07/2020 1428   ALT 18 07/17/2022 1144   ALT 19 04/07/2020 1428   ALKPHOS 58 07/17/2022 1144   BILITOT 0.2 07/17/2022 1144   BILITOT 0.3 04/07/2020 1428   BILIDIR 0.1 08/14/2014 1029   IBILI 0.3 08/14/2014 1029      Component Value Date/Time   TSH 3.420 03/13/2023 1100   Nutritional Lab Results  Component Value Date   VD25OH 32.8 01/03/2023   VD25OH 38.6 07/17/2022   VD25OH 37.0 03/08/2022    Attestations:   Reviewed by clinician on day of visit: allergies, medications, problem list, medical history, surgical history, family history, social history, and previous encounter notes.    I,Special Puri,acting as a Neurosurgeon for [employer], DO.,have documented all relevant documentation on the behalf of Thomasene Lot, DO,as directed by  Thomasene Lot, DO while in the presence of Thomasene Lot, DO.   I, Thomasene Lot, DO, have reviewed all documentation for this visit. The documentation on 05/21/23 for the exam, diagnosis, procedures, and orders are all accurate and complete.

## 2023-06-26 ENCOUNTER — Ambulatory Visit (INDEPENDENT_AMBULATORY_CARE_PROVIDER_SITE_OTHER): Payer: 59 | Admitting: Family Medicine

## 2023-07-16 ENCOUNTER — Other Ambulatory Visit: Payer: Self-pay

## 2023-07-16 ENCOUNTER — Other Ambulatory Visit: Payer: Self-pay | Admitting: Family Medicine

## 2023-07-16 DIAGNOSIS — E039 Hypothyroidism, unspecified: Secondary | ICD-10-CM

## 2023-07-16 DIAGNOSIS — M542 Cervicalgia: Secondary | ICD-10-CM

## 2023-07-16 DIAGNOSIS — M79622 Pain in left upper arm: Secondary | ICD-10-CM

## 2023-07-16 MED ORDER — GABAPENTIN 300 MG PO CAPS: ORAL_CAPSULE | ORAL | 0 refills | Status: DC

## 2023-07-16 MED ORDER — TIZANIDINE HCL 4 MG PO TABS
4.0000 mg | ORAL_TABLET | Freq: Every day | ORAL | 0 refills | Status: DC
Start: 2023-07-16 — End: 2023-09-11

## 2023-07-16 NOTE — Telephone Encounter (Signed)
Patient is scheduled for 8/14

## 2023-07-31 NOTE — Progress Notes (Unsigned)
[name] [address] [place] [address] Phone: [phone] Subjective:   INadine Counts, am serving as a scribe for Dr. Antoine Primas.  I'm seeing this patient by the request  of:  Bradd Canary, MD  CC: Neck and upper back pain follow-up  [name]  Alicia Cox is a 52 y.o. female coming in with complaint of having a little right ankle pain. Has been hurting on and off for several week. Feels like it is unstable.   Cervical neck x-rays that show some mild degenerative changes of the cervical spine but otherwise fairly unremarkable.  Past Medical History:  Diagnosis Date   Acid reflux    Acute bronchitis 11/03/2013   Allergy    hay fever   Anxiety    Arthritis    Asthma    Back pain    Blood in stool    Cervical cancer screening 09/14/2016   Menarche at 13 Regular and heavy flow no history of abnormal pap in past G2P2, s/p 2 svd No history of abnormal MGM No concerns today  gyn surgeries: tubal and endometrial ablation LMP 09/13/2016   Chest pain 08/24/2013   Chicken pox as a child   Depression    Dermatitis 03/02/2016   Frequent headaches    Gallbladder problem    GERD (gastroesophageal reflux disease)    Hashimoto's disease    Heart murmur    History of blood clots    Hypothyroidism    Hashimoto's   IBS (irritable bowel syndrome)    Joint pain    Knee pain, left 01/09/2014   Low back pain 08/26/2017   Migraine    Obesity 11/15/2014   Preventative health care 09/12/2015   Rheumatoid arthritis (HCC)    Sinus complaint    SOBOE (shortness of breath on exertion)    TOS (thoracic outlet syndrome) 08/25/2013   UTI (urinary tract infection)    Vitamin D deficiency    Past Surgical History:  Procedure Laterality Date   ABLATION  2009   APPENDECTOMY  1994   CESAREAN SECTION  01/10/1993   CESAREAN SECTION  04/21/1998   CHOLECYSTECTOMY  2011   ENDOMETRIAL ABLATION     TONSILLECTOMY  1983   TUBAL LIGATION   1999   Social History   Socioeconomic History   Marital status: Married    Spouse name: Navid Musacchio   Number of children: 2   Years of education: Not on file   Highest education level: Associate degree: academic program  Occupational History   Occupation: Runner, broadcasting/film/video at [employer]    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  Tobacco Use   Smoking status: Never   Smokeless tobacco: Never   Tobacco comments:    NEVER USED TOBACCO  Vaping Use   Vaping status: Never Used  Substance and Sexual Activity   Alcohol use: No    Alcohol/week: 0.0 standard drinks of alcohol   Drug use: No   Sexual activity: Yes    Partners: Male    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, no dietary restrictions, 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  Other Topics Concern   Not on file  Social History Narrative   Not on file   Social Determinants of Health   Financial Resource Strain: Low Risk  (03/11/2023)   Overall Financial Resource Strain (CARDIA)    Difficulty of Paying Living Expenses: Not very hard  Food Insecurity: No Food Insecurity (03/11/2023)   Hunger Vital Sign  Worried About Programme researcher, broadcasting/film/video in the Last Year: Never true    Ran Out of Food in the Last Year: Never true  Transportation Needs: No Transportation Needs (03/11/2023)   PRAPARE - Administrator, Civil Service (Medical): No    Lack of Transportation (Non-Medical): No  Physical Activity: Insufficiently Active (03/11/2023)   Exercise Vital Sign    Days of Exercise per Week: 3 days    Minutes of Exercise per Session: 20 min  Stress: No Stress Concern Present (03/11/2023)   [employer] of Occupational Health - Occupational Stress Questionnaire    Feeling of Stress : Only a little  Social Connections: Moderately Integrated (03/11/2023)   Social Connection and Isolation Panel [NHANES]    Frequency of Communication with Friends and Family: Twice a week    Frequency of Social Gatherings with Friends and Family: Once a week    Attends  Religious Services: More than 4 times per year    Active Member of [employer] or Organizations: No    Attends Engineer, structural: Not on file    Marital Status: Married   No Known Allergies Family History  Problem Relation Age of Onset   Cancer Mother        skin cancer   Hyperlipidemia Mother    Hypertension Mother    Skin cancer Mother    Diabetes Father        type 2   Cancer Sister        BCC   Hypertension Sister    Diabetes Sister    Asthma Daughter    Heart disease Maternal Grandmother    Diabetes Maternal Grandmother    Heart attack Maternal Grandfather    Diabetes Maternal Grandfather    Diabetes Paternal Grandmother    Diabetes Paternal Grandfather     Current Outpatient Medications (Endocrine & Metabolic):    levothyroxine (SYNTHROID) 100 MCG tablet, TAKE 1 TABLET BY MOUTH ONCE DAILY EXCEPT TAKE 1 & 1/2 (ONE & ONE-HALF) TABLET ONCE DAILY ON THURSDAYS AND SUNDAYS   Current Outpatient Medications (Respiratory):    albuterol (VENTOLIN HFA) 108 (90 Base) MCG/ACT inhaler, Inhale 2 puffs into the lungs every 6 (six) hours as needed for wheezing or shortness of breath.   azelastine (ASTELIN) 0.1 % nasal spray, Place 1 spray into both nostrils daily. Use in each nostril as directed   fluticasone-salmeterol (ADVAIR) 250-50 MCG/ACT AEPB, INHALE 1 DOSE BY MOUTH IN THE MORNING AND AT BEDTIME   montelukast (SINGULAIR) 10 MG tablet, TAKE 1 TABLET BY MOUTH AT BEDTIME  Current Outpatient Medications (Analgesics):    aspirin 81 MG EC tablet, Take by mouth.   meloxicam (MOBIC) 7.5 MG tablet, Take 1 tablet by mouth once daily   Current Outpatient Medications (Other):    buPROPion (WELLBUTRIN XL) 300 MG 24 hr tablet, Take 1 tablet by mouth once daily   dicyclomine (BENTYL) 10 MG capsule, Take 1 capsule (10 mg total) by mouth 3 (three) times daily as needed for spasms.   escitalopram (LEXAPRO) 10 MG tablet, Take 1 tablet by mouth once daily   gabapentin (NEURONTIN) 300 MG  capsule, nightly   omeprazole (PRILOSEC) 40 MG capsule, Take 1 capsule (40 mg total) by mouth daily.   tiZANidine (ZANAFLEX) 4 MG tablet, Take 1 tablet (4 mg total) by mouth at bedtime.   Vitamin D, Ergocalciferol, (DRISDOL) 1.25 MG (50000 UNIT) CAPS capsule, 1 po q wed and q sun   vitamin k 100 MCG tablet, Take  1 tablet (100 mcg total) by mouth daily.   Reviewed prior external information including notes and imaging from  primary care provider As well as notes that were available from care everywhere and other healthcare systems.  Past medical history, social, surgical and family history all reviewed in electronic medical record.  No pertanent information unless stated regarding to the chief complaint.   Review of Systems:  No headache, visual changes, nausea, vomiting, diarrhea, constipation, dizziness, abdominal pain, skin rash, fevers, chills, night sweats, weight loss, swollen lymph nodes, body aches, joint swelling, chest pain, shortness of breath, mood changes. POSITIVE muscle aches  Objective  There were no vitals taken for this visit.   General: No apparent distress alert and oriented x3 mood and affect normal, dressed appropriately.  HEENT: Pupils equal, extraocular movements intact  Respiratory: Patient's speak in full sentences and does not appear short of breath  Cardiovascular: No lower extremity edema, non tender, no erythema  Neck exam shows    Impression and Recommendations:     The above documentation has been reviewed and is accurate and complete Judi Saa, DO

## 2023-08-01 ENCOUNTER — Ambulatory Visit (INDEPENDENT_AMBULATORY_CARE_PROVIDER_SITE_OTHER): Payer: 59 | Admitting: Family Medicine

## 2023-08-01 ENCOUNTER — Ambulatory Visit (INDEPENDENT_AMBULATORY_CARE_PROVIDER_SITE_OTHER): Payer: 59

## 2023-08-01 ENCOUNTER — Other Ambulatory Visit: Payer: Self-pay

## 2023-08-01 ENCOUNTER — Ambulatory Visit: Payer: 59 | Admitting: Family Medicine

## 2023-08-01 VITALS — BP 114/84 | HR 79 | Ht 64.0 in | Wt 221.0 lb

## 2023-08-01 DIAGNOSIS — M25571 Pain in right ankle and joints of right foot: Secondary | ICD-10-CM

## 2023-08-01 DIAGNOSIS — M999 Biomechanical lesion, unspecified: Secondary | ICD-10-CM

## 2023-08-01 DIAGNOSIS — M9902 Segmental and somatic dysfunction of thoracic region: Secondary | ICD-10-CM | POA: Diagnosis not present

## 2023-08-01 DIAGNOSIS — M9904 Segmental and somatic dysfunction of sacral region: Secondary | ICD-10-CM

## 2023-08-01 DIAGNOSIS — M9901 Segmental and somatic dysfunction of cervical region: Secondary | ICD-10-CM

## 2023-08-01 DIAGNOSIS — M9903 Segmental and somatic dysfunction of lumbar region: Secondary | ICD-10-CM | POA: Diagnosis not present

## 2023-08-01 DIAGNOSIS — M9908 Segmental and somatic dysfunction of rib cage: Secondary | ICD-10-CM | POA: Diagnosis not present

## 2023-08-01 DIAGNOSIS — M7671 Peroneal tendinitis, right leg: Secondary | ICD-10-CM | POA: Diagnosis not present

## 2023-08-01 DIAGNOSIS — M542 Cervicalgia: Secondary | ICD-10-CM

## 2023-08-01 NOTE — Patient Instructions (Signed)
Xray today [employer], Hoka Bondhi, Gravity defyer Recover sandals in house Hey dudes go bye bye See you again in 2 months

## 2023-08-02 ENCOUNTER — Encounter: Payer: Self-pay | Admitting: Family Medicine

## 2023-08-02 DIAGNOSIS — M7671 Peroneal tendinitis, right leg: Secondary | ICD-10-CM | POA: Insufficient documentation

## 2023-08-02 NOTE — Assessment & Plan Note (Signed)

## 2023-08-02 NOTE — Assessment & Plan Note (Signed)
Has responded well to osteopathic inoculation of the past.  Attempted again today.  Discussed posture and ergonomics.  Discussed worsening symptoms advanced imaging and potential injections may be necessary.  No change in medications.  Follow-up again in 6 to 8 weeks

## 2023-08-02 NOTE — Assessment & Plan Note (Signed)
Likely did have a large injury at some point does have some lateral column overload with some instability.  Discussed proper home exercises, proper shoes, recovery sandals.  Worsening pain consider injections or imaging.  Follow-up again in 6 to 8 weeks

## 2023-08-06 ENCOUNTER — Ambulatory Visit (INDEPENDENT_AMBULATORY_CARE_PROVIDER_SITE_OTHER): Payer: 59 | Admitting: Family Medicine

## 2023-08-10 ENCOUNTER — Other Ambulatory Visit: Payer: Self-pay | Admitting: Family Medicine

## 2023-08-30 ENCOUNTER — Other Ambulatory Visit: Payer: Self-pay | Admitting: Family Medicine

## 2023-09-11 ENCOUNTER — Other Ambulatory Visit: Payer: Self-pay | Admitting: Family Medicine

## 2023-09-11 DIAGNOSIS — M542 Cervicalgia: Secondary | ICD-10-CM

## 2023-09-11 DIAGNOSIS — M79622 Pain in left upper arm: Secondary | ICD-10-CM

## 2023-09-12 ENCOUNTER — Ambulatory Visit (INDEPENDENT_AMBULATORY_CARE_PROVIDER_SITE_OTHER): Payer: 59 | Admitting: Family Medicine

## 2023-09-12 ENCOUNTER — Other Ambulatory Visit: Payer: Self-pay | Admitting: Internal Medicine

## 2023-09-17 ENCOUNTER — Encounter: Payer: 59 | Admitting: Family Medicine

## 2023-09-18 ENCOUNTER — Encounter: Payer: 59 | Admitting: Family Medicine

## 2023-10-03 ENCOUNTER — Ambulatory Visit: Payer: 59 | Admitting: Family Medicine

## 2023-10-08 ENCOUNTER — Other Ambulatory Visit: Payer: Self-pay | Admitting: Family Medicine

## 2023-10-08 DIAGNOSIS — M79622 Pain in left upper arm: Secondary | ICD-10-CM

## 2023-10-08 DIAGNOSIS — M542 Cervicalgia: Secondary | ICD-10-CM

## 2023-10-15 ENCOUNTER — Other Ambulatory Visit: Payer: Self-pay | Admitting: Family Medicine

## 2023-10-15 DIAGNOSIS — E039 Hypothyroidism, unspecified: Secondary | ICD-10-CM

## 2023-10-15 MED ORDER — LEVOTHYROXINE SODIUM 100 MCG PO TABS
ORAL_TABLET | ORAL | 1 refills | Status: DC
Start: 2023-10-15 — End: 2024-03-10

## 2023-10-16 ENCOUNTER — Other Ambulatory Visit: Payer: Self-pay | Admitting: Family Medicine

## 2023-11-09 ENCOUNTER — Other Ambulatory Visit: Payer: Self-pay | Admitting: Family Medicine

## 2023-11-11 ENCOUNTER — Other Ambulatory Visit: Payer: Self-pay | Admitting: Family Medicine

## 2023-12-12 ENCOUNTER — Other Ambulatory Visit: Payer: Self-pay | Admitting: Family Medicine

## 2023-12-12 DIAGNOSIS — M542 Cervicalgia: Secondary | ICD-10-CM

## 2023-12-12 DIAGNOSIS — M79622 Pain in left upper arm: Secondary | ICD-10-CM

## 2024-01-13 ENCOUNTER — Other Ambulatory Visit: Payer: Self-pay | Admitting: Family Medicine

## 2024-01-27 ENCOUNTER — Other Ambulatory Visit: Payer: Self-pay | Admitting: Family Medicine

## 2024-01-27 DIAGNOSIS — M542 Cervicalgia: Secondary | ICD-10-CM

## 2024-01-27 DIAGNOSIS — M79622 Pain in left upper arm: Secondary | ICD-10-CM

## 2024-02-12 ENCOUNTER — Other Ambulatory Visit: Payer: Self-pay | Admitting: Family Medicine

## 2024-02-24 ENCOUNTER — Other Ambulatory Visit: Payer: Self-pay | Admitting: Family Medicine

## 2024-02-25 NOTE — Assessment & Plan Note (Signed)
 Encourage heart healthy diet such as MIND or DASH diet, increase exercise, avoid trans fats, simple carbohydrates and processed foods, consider a krill or fish or flaxseed oil cap daily.

## 2024-02-25 NOTE — Assessment & Plan Note (Signed)
 Patient encouraged to maintain heart healthy diet, regular exercise, adequate sleep. Consider daily probiotics. Take medications as prescribed. Labs reviewed and she will have labs soon at [facility]. Colonoscopy: Last completed on 06/02/2014. Normal results. Repeat in 2025.  Pap Smear: Last completed on 07/05/2021. Normal results. Repeat in 3-5 years.  Mammogram: Last completed on 07/2022. No mammographic evidence of malignancy. Repeat by 07/2024

## 2024-02-25 NOTE — Assessment & Plan Note (Signed)
 Supplement and monitor

## 2024-02-25 NOTE — Assessment & Plan Note (Signed)
 On Levothyroxine, continue to monitor

## 2024-02-25 NOTE — Assessment & Plan Note (Signed)
 hgba1c acceptable, minimize simple carbs. Increase exercise as tolerated.

## 2024-02-25 NOTE — Assessment & Plan Note (Signed)
 Encouraged DASH or MIND diet, decrease po intake and increase exercise as tolerated. Needs 7-8 hours of sleep nightly. Avoid trans fats, eat small, frequent meals every 4-5 hours with lean proteins, complex carbs and healthy fats. Minimize simple carbs, high fat foods and processed foods

## 2024-02-28 ENCOUNTER — Other Ambulatory Visit: Payer: Self-pay

## 2024-02-28 ENCOUNTER — Ambulatory Visit (INDEPENDENT_AMBULATORY_CARE_PROVIDER_SITE_OTHER): Payer: 59 | Admitting: Family Medicine

## 2024-02-28 VITALS — BP 150/90 | HR 78 | Temp 97.7°F | Resp 18 | Ht 64.0 in | Wt 249.0 lb

## 2024-02-28 DIAGNOSIS — R519 Headache, unspecified: Secondary | ICD-10-CM

## 2024-02-28 DIAGNOSIS — R5383 Other fatigue: Secondary | ICD-10-CM

## 2024-02-28 DIAGNOSIS — Z1239 Encounter for other screening for malignant neoplasm of breast: Secondary | ICD-10-CM

## 2024-02-28 DIAGNOSIS — E063 Autoimmune thyroiditis: Secondary | ICD-10-CM | POA: Diagnosis not present

## 2024-02-28 DIAGNOSIS — R739 Hyperglycemia, unspecified: Secondary | ICD-10-CM | POA: Diagnosis not present

## 2024-02-28 DIAGNOSIS — E559 Vitamin D deficiency, unspecified: Secondary | ICD-10-CM

## 2024-02-28 DIAGNOSIS — E782 Mixed hyperlipidemia: Secondary | ICD-10-CM

## 2024-02-28 DIAGNOSIS — Z0000 Encounter for general adult medical examination without abnormal findings: Secondary | ICD-10-CM

## 2024-02-28 DIAGNOSIS — R0789 Other chest pain: Secondary | ICD-10-CM | POA: Diagnosis not present

## 2024-02-28 DIAGNOSIS — Z1211 Encounter for screening for malignant neoplasm of colon: Secondary | ICD-10-CM

## 2024-02-28 NOTE — Patient Instructions (Addendum)
 Live to 100 the Secrets of the Blue [employer]ones on netflix [employer]ron blood pressure cuff  K[employer]ia mobile   Preventive Care 40-52 Years Old, Female Preventive care refers to lifestyle choices and visits with your health care provider that can promote health and wellness. Preventive care visits are also called wellness exams. What can I expect for my preventive care visit? Counseling Your health care provider may ask you questions about your: Medical history, including: Past medical problems. Family medical history. Pregnancy history. Current health, including: Menstrual cycle. Method of birth control. Emotional well-being. Home life and relationship well-being. Sexual activity and sexual health. Lifestyle, including: Alcohol, nicotine or tobacco, and drug use. Access to firearms. Diet, exercise, and sleep habits. Work and work Astronomer. Sunscreen use. Safety issues such as seatbelt and bike helmet use. Physical exam Your health care provider will check your: Height and weight. These may be used to calculate your BMI (body mass index). BMI is a measurement that tells if you are at a healthy weight. Waist circumference. This measures the distance around your waistline. This measurement also tells if you are at a healthy weight and may help predict your risk of certain diseases, such as type 2 diabetes and high blood pressure. Heart rate and blood pressure. Body temperature. Skin for abnormal spots. What immunizations do I need?  Vaccines are usually given at various ages, according to a schedule. Your health care provider will recommend vaccines for you based on your age, medical history, and lifestyle or other factors, such as travel or where you work. What tests do I need? Screening Your health care provider may recommend screening tests for certain conditions. This may include: Lipid and cholesterol levels. Diabetes screening. This is done by checking your blood sugar (glucose)  after you have not eaten for a while (fasting). Pelvic exam and Pap test. Hepatitis B test. Hepatitis C test. HIV (human immunodeficiency virus) test. STI (sexually transmitted infection) testing, if you are at risk. Lung cancer screening. Colorectal cancer screening. Mammogram. Talk with your health care provider about when you should start having regular mammograms. This may depend on whether you have a family history of breast cancer. BRCA-related cancer screening. This may be done if you have a family history of breast, ovarian, tubal, or peritoneal cancers. Bone density scan. This is done to screen for osteoporosis. Talk with your health care provider about your test results, treatment options, and if necessary, the need for more tests. Follow these instructions at home: Eating and drinking  Eat a diet that includes fresh fruits and vegetables, whole grains, lean protein, and low-fat dairy products. Take vitamin and mineral supplements as recommended by your health care provider. Do not drink alcohol if: Your health care provider tells you not to drink. You are pregnant, may be pregnant, or are planning to become pregnant. If you drink alcohol: Limit how much you have to 0-1 drink a day. Know how much alcohol is in your drink. In the [place] one drink equals one 12 oz bottle of beer (355 mL), one 5 oz glass of wine (148 mL), or one 1 oz glass of hard liquor (44 mL). Lifestyle Brush your teeth every morning and night with fluoride toothpaste. Floss one time each day. Exercise for at least 30 minutes 5 or more days each week. Do not use any products that contain nicotine or tobacco. These products include cigarettes, chewing tobacco, and vaping devices, such as e-cigarettes. If you need help quitting, ask your health care provider. Do  not use drugs. If you are sexually active, practice safe sex. Use a condom or other form of protection to prevent STIs. If you do not wish to become  pregnant, use a form of birth control. If you plan to become pregnant, see your health care provider for a prepregnancy visit. Take aspirin only as told by your health care provider. Make sure that you understand how much to take and what form to take. Work with your health care provider to find out whether it is safe and beneficial for you to take aspirin daily. Find healthy ways to manage stress, such as: Meditation, yoga, or listening to music. Journaling. Talking to a trusted person. Spending time with friends and family. Minimize exposure to UV radiation to reduce your risk of skin cancer. Safety Always wear your seat belt while driving or riding in a vehicle. Do not drive: If you have been drinking alcohol. Do not ride with someone who has been drinking. When you are tired or distracted. While texting. If you have been using any mind-altering substances or drugs. Wear a helmet and other protective equipment during sports activities. If you have firearms in your house, make sure you follow all gun safety procedures. Seek help if you have been physically or sexually abused. What's next? Visit your health care provider once a year for an annual wellness visit. Ask your health care provider how often you should have your eyes and teeth checked. Stay up to date on all vaccines. This information is not intended to replace advice given to you by your health care provider. Make sure you discuss any questions you have with your health care provider. Document Revised: 06/01/2021 Document Reviewed: 06/01/2021 Elsevier Patient Education  2024 [employer].

## 2024-02-29 ENCOUNTER — Encounter: Payer: Self-pay | Admitting: Family Medicine

## 2024-03-02 ENCOUNTER — Encounter: Payer: Self-pay | Admitting: Family Medicine

## 2024-03-02 NOTE — Progress Notes (Signed)
 Subjective:    Patient ID: Alicia Cox, female    DOB: 17-Aug-1971, 53 y.o.   MRN: [id number]  Chief Complaint  Patient presents with  . Annual Exam    HPI Discussed the use of AI scribe software for clinical note transcription with the patient, who gave verbal consent to proceed.  History of Present Illness She presents with fatigue and weight gain.  She experiences persistent fatigue and significant weight gain of fifty pounds over the past year. She feels tired all the time, even after sleeping, and attributes some of the weight gain to poor eating habits and stress. She has not had a recent sleep study, but had one years ago. Her husband notes occasional snoring, but it is not consistent. She is concerned about her sugar and thyroid levels as well.  She describes pain from her elbow down to her hand, which has been present for a couple of months. She suspects it might be related to her sleeping position or from picking up her granddaughter. She also reports occasional chest pain that does not radiate down her arm and lasts only seconds. No shortness of breath, nausea, or dizziness.  She mentions her vitamin D levels are usually low and she was previously on a weekly prescription for it. She inquires about checking her iron levels, noting that past tests were fine.  Her family history includes her mother being in renal failure and currently in hospice care, and her father having diabetes. Her mother is 61 and her father is 72. She mentions her mother had high blood pressure and her father is possibly in stage three or four of renal failure. Her grandmother had coronary heart disease.  She has experienced a difficult year, including a job change after 23 years, which has contributed to stress and poor eating habits. 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    Past Medical History:  Diagnosis Date  . Acid reflux   .  Acute bronchitis 11/03/2013  . Allergy    hay fever  . Anxiety   . Arthritis   . Asthma   . Back pain   . Blood in stool   . Cervical cancer screening 09/14/2016   Menarche at 13 Regular and heavy flow no history of abnormal pap in past G2P2, s/p 2 svd No history of abnormal MGM No concerns today  gyn surgeries: tubal and endometrial ablation LMP 09/13/2016  . Chest pain 08/24/2013  . Chicken pox as a child  . Depression   . Dermatitis 03/02/2016  . Frequent headaches   . Gallbladder problem   . GERD (gastroesophageal reflux disease)   . Hashimoto's disease   . Heart murmur   . History of blood clots   . Hypothyroidism    Hashimoto's  . IBS (irritable bowel syndrome)   . Joint pain   . Knee pain, left 01/09/2014  . Low back pain 08/26/2017  . Migraine   . Obesity 11/15/2014  . Preventative health care 09/12/2015  . Rheumatoid arthritis (HCC)   . Sinus complaint   . SOBOE (shortness of breath on exertion)   . TOS (thoracic outlet syndrome) 08/25/2013  . UTI (urinary tract infection)   . Vitamin D deficiency     Past Surgical History:  Procedure Laterality Date  . ABLATION  2009  . APPENDECTOMY  1994  . CESAREAN SECTION  01/10/1993  . CESAREAN SECTION  04/21/1998  . CHOLECYSTECTOMY  2011  . ENDOMETRIAL ABLATION    . TONSILLECTOMY  1983  . TUBAL LIGATION  1999    Family History  Problem Relation Age of Onset  . Cancer Mother        skin cancer  . Hyperlipidemia Mother   . Hypertension Mother   . Skin cancer Mother   . Diabetes Father        type 2  . Cancer Sister        BCC  . Hypertension Sister   . Diabetes Sister   . Asthma Daughter   . Heart disease Maternal Grandmother   . Diabetes Maternal Grandmother   . Heart attack Maternal Grandfather   . Diabetes Maternal Grandfather   . Diabetes Paternal Grandmother   . Diabetes Paternal Grandfather     Social History   Socioeconomic History  . Marital status: Married    Spouse name: Tesia Lybrand   . Number of children: 2  . Years of education: Not on file  . Highest education level: Associate degree: academic program  Occupational History  . 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 Smoking status: Never  . Smokeless tobacco: Never  . Tobacco comments:    NEVER USED TOBACCO  Vaping Use  . Vaping status: Never Used  Substance and Sexual Activity  . Alcohol use: No    Alcohol/week: 0.0 standard drinks of alcohol  . Drug use: No  . 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 Not on file  Social History Narrative  . Not on file   Social Drivers of Health   Financial Resource Strain: Low Risk  (03/11/2023)   Overall Financial Resource Strain (CARDIA)   . Difficulty of Paying Living Expenses: Not very hard  Food Insecurity: No Food Insecurity (03/11/2023)   Hunger Vital Sign   . Worried About Programme researcher, broadcasting/film/video in the Last Year: Never true   . Ran Out of Food in the Last Year: Never true  Transportation Needs: No Transportation Needs (03/11/2023)   PRAPARE - Transportation   . Lack of Transportation (Medical): No   . Lack of Transportation (Non-Medical): No  Physical Activity: Insufficiently Active (03/11/2023)   Exercise Vital Sign   . Days of Exercise per Week: 3 days   . Minutes of Exercise per Session: 20 min  Stress: No Stress Concern Present (03/11/2023)   [employer] of Occupational Health - Occupational Stress Questionnaire   . Feeling of Stress : Only a little  Social Connections: Moderately Integrated (03/11/2023)   Social Connection and Isolation Panel [NHANES]   . Frequency of Communication with Friends and Family: Twice a week   . Frequency of Social Gatherings with Friends and Family: Once a week   . Attends Religious Services: More than 4 times per year   . Active Member of Clubs or Organizations: No    . Attends Banker Meetings: Not on file   . Marital Status: Married  Catering manager Violence: Not on file    Outpatient Medications Prior to Visit  Medication Sig Dispense Refill  . albuterol (VENTOLIN HFA) 108 (90 Base) MCG/ACT inhaler Inhale 2 puffs into the lungs every 6 (six) hours as needed for wheezing or shortness of breath. 18 g 5  . aspirin 81 MG EC tablet Take by  mouth.    . azelastine (ASTELIN) 0.1 % nasal spray Place 1 spray into both nostrils daily. Use in each nostril as directed 30 mL 12  . budesonide-formoterol (SYMBICORT) 160-4.5 MCG/ACT inhaler Inhale 1 puff into the lungs 2 (two) times daily. 30.6 g 0  . buPROPion (WELLBUTRIN XL) 300 MG 24 hr tablet Take 1 tablet by mouth once daily 90 tablet 0  . dicyclomine (BENTYL) 10 MG capsule Take 1 capsule (10 mg total) by mouth 3 (three) times daily as needed for spasms. 90 capsule 1  . escitalopram (LEXAPRO) 10 MG tablet Take 1 tablet by mouth once daily 90 tablet 0  . gabapentin (NEURONTIN) 300 MG capsule TAKE 1 CAPSULE BY MOUTH NIGHTLY 30 capsule 0  . levothyroxine (SYNTHROID) 100 MCG tablet TAKE 1 TABLET BY MOUTH ONCE DAILY EXCEPT TAKE 1 & 1/2 (ONE & ONE-HALF) TABLET ONCE DAILY ON THURSDAYS AND SUNDAYS 105 tablet 1  . meloxicam (MOBIC) 7.5 MG tablet Take 1 tablet by mouth once daily 30 tablet 0  . montelukast (SINGULAIR) 10 MG tablet Take 1 tablet (10 mg total) by mouth at bedtime. 90 tablet 1  . omeprazole (PRILOSEC) 40 MG capsule Take 1 capsule (40 mg total) by mouth daily. 30 capsule 5  . tiZANidine (ZANAFLEX) 4 MG tablet TAKE 1 TABLET BY MOUTH AT BEDTIME 30 tablet 0  . Vitamin D, Ergocalciferol, (DRISDOL) 1.25 MG (50000 UNIT) CAPS capsule 1 po q wed and q sun 8 capsule 0  . vitamin k 100 MCG tablet Take 1 tablet (100 mcg total) by mouth daily.     No facility-administered medications prior to visit.    No Known Allergies  Review of Systems  Constitutional:  Positive for malaise/fatigue. Negative for  chills and fever.  HENT:  Negative for congestion and hearing loss.   Eyes:  Negative for discharge.  Respiratory:  Negative for cough, sputum production and shortness of breath.   Cardiovascular:  Negative for chest pain, palpitations and leg swelling.  Gastrointestinal:  Negative for abdominal pain, blood in stool, constipation, diarrhea, heartburn, nausea and vomiting.  Genitourinary:  Negative for dysuria, frequency, hematuria and urgency.  Musculoskeletal:  Positive for joint pain and myalgias. Negative for back pain and falls.  Skin:  Negative for rash.  Neurological:  Negative for dizziness, sensory change, loss of consciousness, weakness and headaches.  Endo/Heme/Allergies:  Negative for environmental allergies. Does not bruise/bleed easily.  Psychiatric/Behavioral:  Negative for suicidal ideas. The patient is nervous/anxious and has insomnia.        Objective:    Physical Exam Constitutional:      General: She is not in acute distress.    Appearance: Normal appearance. She is not diaphoretic.  HENT:     Head: Normocephalic and atraumatic.     Right Ear: Tympanic membrane, ear canal and external ear normal.     Left Ear: Tympanic membrane, ear canal and external ear normal.     Nose: Nose normal.     Mouth/Throat:     Mouth: Mucous membranes are moist.     Pharynx: Oropharynx is clear. No oropharyngeal exudate.  Eyes:     General: No scleral icterus.       Right eye: No discharge.        Left eye: No discharge.     Conjunctiva/sclera: Conjunctivae normal.     Pupils: Pupils are equal, round, and reactive to light.  Neck:     Thyroid: No thyromegaly.  Cardiovascular:     Rate  and Rhythm: Normal rate and regular rhythm.     Heart sounds: Normal heart sounds. No murmur heard. Pulmonary:     Effort: Pulmonary effort is normal. No respiratory distress.     Breath sounds: Normal breath sounds. No wheezing or rales.  Abdominal:     General: Bowel sounds are normal. There  is no distension.     Palpations: Abdomen is soft. There is no mass.     Tenderness: There is no abdominal tenderness.  Musculoskeletal:        General: Tenderness present. Normal range of motion.     Cervical back: Normal range of motion and neck supple.     Comments: Tender with movement over medial elbow  Lymphadenopathy:     Cervical: No cervical adenopathy.  Skin:    General: Skin is warm and dry.     Findings: No rash.  Neurological:     General: No focal deficit present.     Mental Status: She is alert and oriented to person, place, and time.     Cranial Nerves: No cranial nerve deficit.     Coordination: Coordination normal.     Deep Tendon Reflexes: Reflexes are normal and symmetric. Reflexes normal.  Psychiatric:        Mood and Affect: Mood normal.        Behavior: Behavior normal.        Thought Content: Thought content normal.        Judgment: Judgment normal.   BP (!) 150/90 (BP Location: Left Arm, Patient Position: Sitting, Cuff Size: Large)   Pulse 78   Temp 97.7 F (36.5 C) (Oral)   Resp 18   Ht 5\' 4"  (1.626 m)   Wt 249 lb (112.9 kg)   SpO2 99%   BMI 42.74 kg/m  Wt Readings from Last 3 Encounters:  02/28/24 249 lb (112.9 kg)  08/01/23 221 lb (100.2 kg)  05/09/23 208 lb 3.2 oz (94.4 kg)    Diabetic Foot Exam - Simple   No data filed    Lab Results  Component Value Date   WBC 8.2 07/17/2022   HGB 15.2 07/17/2022   HCT 44.8 07/17/2022   PLT 318 07/17/2022   GLUCOSE 108 (H) 07/17/2022   CHOL 136 03/13/2023   TRIG 98 03/13/2023   HDL 46 03/13/2023   LDLDIRECT 104.0 09/17/2017   LDLCALC 72 03/13/2023   ALT 18 07/17/2022   AST 14 07/17/2022   NA 142 07/17/2022   K 4.5 07/17/2022   CL 105 07/17/2022   CREATININE 0.75 07/17/2022   BUN 17 07/17/2022   CO2 24 07/17/2022   TSH 3.420 03/13/2023   HGBA1C 5.1 01/03/2023    Lab Results  Component Value Date   TSH 3.420 03/13/2023   Lab Results  Component Value Date   WBC 8.2 07/17/2022   HGB  15.2 07/17/2022   HCT 44.8 07/17/2022   MCV 99 (H) 07/17/2022   PLT 318 07/17/2022   Lab Results  Component Value Date   NA 142 07/17/2022   K 4.5 07/17/2022   CO2 24 07/17/2022   GLUCOSE 108 (H) 07/17/2022   BUN 17 07/17/2022   CREATININE 0.75 07/17/2022   BILITOT 0.2 07/17/2022   ALKPHOS 58 07/17/2022   AST 14 07/17/2022   ALT 18 07/17/2022   PROT 7.3 07/17/2022   ALBUMIN 4.4 07/17/2022   CALCIUM 9.3 07/17/2022   ANIONGAP 7 04/07/2020   EGFR 97 07/17/2022   GFR 96.62 07/05/2021   Lab  Results  Component Value Date   CHOL 136 03/13/2023   Lab Results  Component Value Date   HDL 46 03/13/2023   Lab Results  Component Value Date   LDLCALC 72 03/13/2023   Lab Results  Component Value Date   TRIG 98 03/13/2023   Lab Results  Component Value Date   CHOLHDL 3.5 03/08/2022   Lab Results  Component Value Date   HGBA1C 5.1 01/03/2023       Assessment & Plan:  Hyperglycemia Assessment & Plan: hgba1c acceptable, minimize simple carbs. Increase exercise as tolerated.   Orders: -     Comprehensive metabolic panel; Future -     Hemoglobin A1c; Future -     CT CARDIAC SCORING (SELF PAY ONLY); Future -     EKG 12-Lead -     Hemoglobin A1c; Future -     Comprehensive metabolic panel; Future  Hyperlipidemia, mixed Assessment & Plan: Encourage heart healthy diet such as MIND or DASH diet, increase exercise, avoid trans fats, simple carbohydrates and processed foods, consider a krill or fish or flaxseed oil cap daily.   Orders: -     Lipid panel; Future -     CT CARDIAC SCORING (SELF PAY ONLY); Future -     EKG 12-Lead -     Lipid panel; Future  Hypothyroidism due to Hashimoto thyroiditis Assessment & Plan: On Levothyroxine, continue to monitor  Orders: -     TSH; Future -     TSH; Future  Morbid obesity (HCC)-start bmi 43.26 Assessment & Plan: Encouraged DASH or MIND diet, decrease po intake and increase exercise as tolerated. Needs 7-8 hours of sleep  nightly. Avoid trans fats, eat small, frequent meals every 4-5 hours with lean proteins, complex carbs and healthy fats. Minimize simple carbs, high fat foods and processed foods    Preventative health care Assessment & Plan: Patient encouraged to maintain heart healthy diet, regular exercise, adequate sleep. Consider daily probiotics. Take medications as prescribed. Labs reviewed and she will have labs soon at [facility]. Colonoscopy: Last completed on 06/02/2014. Normal results. Repeat in 2025.  Pap Smear: Last completed on 07/05/2021. Normal results. Repeat in 3-5 years.  Mammogram: Last completed on 07/2022. No mammographic evidence of malignancy. Repeat by 07/2024   Vitamin D deficiency Assessment & Plan: Supplement and monitor   Orders: -     VITAMIN D 25 Hydroxy (Vit-D Deficiency, Fractures); Future  Other fatigue -     CBC with Differential/Platelet; Future -     CBC with Differential/Platelet; Future  Frequent headaches -     CBC with Differential/Platelet; Future -     Lipid panel; Future -     Lipid panel; Future -     CBC with Differential/Platelet; Future  Atypical chest pain -     CT CARDIAC SCORING (SELF PAY ONLY); Future -     EKG 12-Lead  Colon cancer screening -     Ambulatory referral to Gastroenterology  Encounter for screening for malignant neoplasm of breast, unspecified screening modality -     3D Screening Mammogram, Left and Right; Future    Assessment and Plan Assessment & Plan Fatigue Chronic fatigue possibly linked to weight gain, poor diet, and potential sleep apnea. - Refer to pulmonology for home sleep study. - Order CBC for anemia. - Check vitamin D levels. - Check thyroid function.  Chest Pain Intermittent chest pain, likely musculoskeletal or cardiac. Family history of coronary artery disease. - Order coronary artery calcium scan  at [facility]. - Refer to Dr. Katrinka Blazing for musculoskeletal evaluation.  Hypertension Borderline hypertension  with family history of renal failure and diabetes. Emphasized monitoring and lifestyle changes. - Recheck blood pressure before leaving. - Recommend home blood pressure monitor ([employer]). - Advise on lifestyle modifications: hydration, diet, exercise. - Schedule follow-up in 6 months.  Obesity Significant weight gain over the past year, possibly stress and diet-related. Discussed weight loss medications if A1c = 6.5. - Discuss weight loss medications if A1c = 6.5. - Encourage lifestyle modifications: diet, physical activity.  Family History of Renal Failure Increased risk for renal disease due to family history and lifestyle factors. - Order metabolic panel for kidney function. - Advise on lifestyle modifications: hydration, diet.  Preventative Health Care Due for colonoscopy and mammogram. Discussed pneumonia vaccination, pending insurance coverage. - Refer to [facility] for colonoscopy scheduling. - Order mammogram at current facility. - Discuss insurance coverage for pneumonia vaccination.     [name], MD

## 2024-03-05 ENCOUNTER — Telehealth (HOSPITAL_BASED_OUTPATIENT_CLINIC_OR_DEPARTMENT_OTHER): Payer: Self-pay

## 2024-03-05 ENCOUNTER — Other Ambulatory Visit (HOSPITAL_BASED_OUTPATIENT_CLINIC_OR_DEPARTMENT_OTHER): Payer: Self-pay

## 2024-03-05 ENCOUNTER — Other Ambulatory Visit (INDEPENDENT_AMBULATORY_CARE_PROVIDER_SITE_OTHER)

## 2024-03-05 DIAGNOSIS — R739 Hyperglycemia, unspecified: Secondary | ICD-10-CM

## 2024-03-05 DIAGNOSIS — R5383 Other fatigue: Secondary | ICD-10-CM

## 2024-03-05 DIAGNOSIS — E063 Autoimmune thyroiditis: Secondary | ICD-10-CM

## 2024-03-05 DIAGNOSIS — R519 Headache, unspecified: Secondary | ICD-10-CM | POA: Diagnosis not present

## 2024-03-05 DIAGNOSIS — E559 Vitamin D deficiency, unspecified: Secondary | ICD-10-CM | POA: Diagnosis not present

## 2024-03-05 DIAGNOSIS — E782 Mixed hyperlipidemia: Secondary | ICD-10-CM | POA: Diagnosis not present

## 2024-03-05 MED ORDER — TETANUS-DIPHTH-ACELL PERTUSSIS 5-2.5-18.5 LF-MCG/0.5 IM SUSY
0.5000 mL | PREFILLED_SYRINGE | Freq: Once | INTRAMUSCULAR | 0 refills | Status: AC
  Filled 2024-03-05: qty 0.5, 1d supply, fill #0

## 2024-03-06 ENCOUNTER — Encounter: Payer: Self-pay | Admitting: Family Medicine

## 2024-03-06 LAB — CBC WITH DIFFERENTIAL/PLATELET
Basophils Absolute: 0.1 10*3/uL (ref 0.0–0.1)
Basophils Relative: 1 % (ref 0.0–3.0)
Eosinophils Absolute: 0.4 10*3/uL (ref 0.0–0.7)
Eosinophils Relative: 3.8 % (ref 0.0–5.0)
HCT: 41.8 % (ref 36.0–46.0)
Hemoglobin: 14.2 g/dL (ref 12.0–15.0)
Lymphocytes Relative: 27.6 % (ref 12.0–46.0)
Lymphs Abs: 2.7 10*3/uL (ref 0.7–4.0)
MCHC: 34 g/dL (ref 30.0–36.0)
MCV: 99.9 fl (ref 78.0–100.0)
Monocytes Absolute: 0.6 10*3/uL (ref 0.1–1.0)
Monocytes Relative: 6.4 % (ref 3.0–12.0)
Neutro Abs: 5.9 10*3/uL (ref 1.4–7.7)
Neutrophils Relative %: 61.2 % (ref 43.0–77.0)
Platelets: 340 10*3/uL (ref 150.0–400.0)
RBC: 4.18 Mil/uL (ref 3.87–5.11)
RDW: 12.3 % (ref 11.5–15.5)
WBC: 9.7 10*3/uL (ref 4.0–10.5)

## 2024-03-06 LAB — COMPREHENSIVE METABOLIC PANEL
ALT: 24 U/L (ref 0–35)
AST: 17 U/L (ref 0–37)
Albumin: 4.2 g/dL (ref 3.5–5.2)
Alkaline Phosphatase: 55 U/L (ref 39–117)
BUN: 11 mg/dL (ref 6–23)
CO2: 29 meq/L (ref 19–32)
Calcium: 8.9 mg/dL (ref 8.4–10.5)
Chloride: 101 meq/L (ref 96–112)
Creatinine, Ser: 0.69 mg/dL (ref 0.40–1.20)
GFR: 100.07 mL/min (ref >60.00)
Glucose, Bld: 118 mg/dL — ABNORMAL HIGH (ref 70–99)
Potassium: 3.9 meq/L (ref 3.5–5.1)
Sodium: 137 meq/L (ref 135–145)
Total Bilirubin: 0.3 mg/dL (ref 0.2–1.2)
Total Protein: 7.1 g/dL (ref 6.0–8.3)

## 2024-03-06 LAB — LIPID PANEL
Cholesterol: 165 mg/dL (ref 0–200)
HDL: 43.2 mg/dL (ref >39.00)
LDL Cholesterol: 88 mg/dL (ref 0–99)
NonHDL: 121.54
Total CHOL/HDL Ratio: 4
Triglycerides: 169 mg/dL — ABNORMAL HIGH (ref 0.0–149.0)
VLDL: 33.8 mg/dL (ref 0.0–40.0)

## 2024-03-06 LAB — TSH: TSH: 2.02 u[IU]/mL (ref 0.35–5.50)

## 2024-03-06 LAB — VITAMIN D 25 HYDROXY (VIT D DEFICIENCY, FRACTURES): VITD: 20.81 ng/mL — ABNORMAL LOW (ref 30.00–100.00)

## 2024-03-06 LAB — HEMOGLOBIN A1C: Hgb A1c MFr Bld: 5.9 % (ref 4.6–6.5)

## 2024-03-07 ENCOUNTER — Other Ambulatory Visit: Payer: Self-pay | Admitting: Emergency Medicine

## 2024-03-07 ENCOUNTER — Encounter: Payer: Self-pay | Admitting: Emergency Medicine

## 2024-03-07 MED ORDER — VITAMIN D (ERGOCALCIFEROL) 1.25 MG (50000 UNIT) PO CAPS: 50000.0000 [IU] | ORAL_CAPSULE | ORAL | 4 refills | Status: AC

## 2024-03-09 ENCOUNTER — Other Ambulatory Visit: Payer: Self-pay | Admitting: Family Medicine

## 2024-03-09 DIAGNOSIS — E039 Hypothyroidism, unspecified: Secondary | ICD-10-CM

## 2024-03-26 ENCOUNTER — Other Ambulatory Visit: Payer: Self-pay | Admitting: Family Medicine

## 2024-04-08 ENCOUNTER — Inpatient Hospital Stay (HOSPITAL_BASED_OUTPATIENT_CLINIC_OR_DEPARTMENT_OTHER): Admission: RE | Admit: 2024-04-08 | Source: Ambulatory Visit

## 2024-04-08 ENCOUNTER — Other Ambulatory Visit (HOSPITAL_BASED_OUTPATIENT_CLINIC_OR_DEPARTMENT_OTHER)

## 2024-04-08 ENCOUNTER — Encounter: Payer: Self-pay | Admitting: Family Medicine

## 2024-04-11 ENCOUNTER — Other Ambulatory Visit: Payer: Self-pay | Admitting: Family Medicine

## 2024-04-11 DIAGNOSIS — M79622 Pain in left upper arm: Secondary | ICD-10-CM

## 2024-04-11 DIAGNOSIS — M542 Cervicalgia: Secondary | ICD-10-CM

## 2024-04-11 MED ORDER — METFORMIN HCL 500 MG PO TABS: 500.0000 mg | ORAL_TABLET | Freq: Every day | ORAL | 1 refills | Status: DC

## 2024-04-29 ENCOUNTER — Other Ambulatory Visit: Payer: Self-pay | Admitting: Family Medicine

## 2024-05-17 ENCOUNTER — Other Ambulatory Visit: Payer: Self-pay | Admitting: Family Medicine

## 2024-05-29 ENCOUNTER — Other Ambulatory Visit: Payer: Self-pay | Admitting: Family Medicine

## 2024-06-03 ENCOUNTER — Ambulatory Visit (HOSPITAL_BASED_OUTPATIENT_CLINIC_OR_DEPARTMENT_OTHER)

## 2024-06-03 ENCOUNTER — Other Ambulatory Visit (HOSPITAL_BASED_OUTPATIENT_CLINIC_OR_DEPARTMENT_OTHER)

## 2024-06-22 ENCOUNTER — Other Ambulatory Visit: Payer: Self-pay | Admitting: Family Medicine

## 2024-06-22 DIAGNOSIS — M79622 Pain in left upper arm: Secondary | ICD-10-CM

## 2024-06-22 DIAGNOSIS — M542 Cervicalgia: Secondary | ICD-10-CM

## 2024-06-22 DIAGNOSIS — E039 Hypothyroidism, unspecified: Secondary | ICD-10-CM

## 2024-07-27 ENCOUNTER — Other Ambulatory Visit: Payer: Self-pay | Admitting: Family Medicine

## 2024-08-19 ENCOUNTER — Other Ambulatory Visit: Payer: Self-pay | Admitting: Family Medicine

## 2024-08-24 ENCOUNTER — Other Ambulatory Visit: Payer: Self-pay | Admitting: Family Medicine

## 2024-08-24 DIAGNOSIS — M79622 Pain in left upper arm: Secondary | ICD-10-CM

## 2024-08-24 DIAGNOSIS — M542 Cervicalgia: Secondary | ICD-10-CM

## 2024-08-27 ENCOUNTER — Other Ambulatory Visit: Payer: Self-pay | Admitting: Family Medicine

## 2024-09-07 NOTE — Assessment & Plan Note (Signed)
 Encourage heart healthy diet such as MIND or DASH diet, increase exercise, avoid trans fats, simple carbohydrates and processed foods, consider a krill or fish or flaxseed oil cap daily.

## 2024-09-07 NOTE — Assessment & Plan Note (Signed)
 On Levothyroxine, continue to monitor

## 2024-09-07 NOTE — Assessment & Plan Note (Signed)
 hgba1c acceptable, minimize simple carbs. Increase exercise as tolerated.

## 2024-09-07 NOTE — Assessment & Plan Note (Signed)
 Stable on current meds

## 2024-09-07 NOTE — Assessment & Plan Note (Signed)
 Encouraged DASH or MIND diet, decrease po intake and increase exercise as tolerated. Needs 7-8 hours of sleep nightly. Avoid trans fats, eat small, frequent meals every 4-5 hours with lean proteins, complex carbs and healthy fats. Minimize simple carbs, high fat foods and processed foods

## 2024-09-07 NOTE — Assessment & Plan Note (Signed)
consider DASH or MIND diet, she is following with [facility] now, decrease po intake and increase exercise as tolerated. Needs 7-8 hours of sleep nightly. Avoid trans fats, eat small, frequent meals every 4-5 hours with lean proteins, complex carbs and healthy fats. Minimize simple carbs, high fat foods and processed foods

## 2024-09-08 ENCOUNTER — Ambulatory Visit: Admitting: Family Medicine

## 2024-09-08 ENCOUNTER — Encounter: Payer: Self-pay | Admitting: Family Medicine

## 2024-09-08 VITALS — BP 138/86 | HR 90 | Temp 97.7°F | Resp 16 | Ht 64.0 in | Wt 264.4 lb

## 2024-09-08 DIAGNOSIS — E063 Autoimmune thyroiditis: Secondary | ICD-10-CM

## 2024-09-08 DIAGNOSIS — E66813 Obesity, class 3: Secondary | ICD-10-CM

## 2024-09-08 DIAGNOSIS — R0683 Snoring: Secondary | ICD-10-CM

## 2024-09-08 DIAGNOSIS — Z6841 Body Mass Index (BMI) 40.0 and over, adult: Secondary | ICD-10-CM

## 2024-09-08 DIAGNOSIS — R739 Hyperglycemia, unspecified: Secondary | ICD-10-CM | POA: Diagnosis not present

## 2024-09-08 DIAGNOSIS — F39 Unspecified mood [affective] disorder: Secondary | ICD-10-CM

## 2024-09-08 DIAGNOSIS — E782 Mixed hyperlipidemia: Secondary | ICD-10-CM | POA: Diagnosis not present

## 2024-09-08 DIAGNOSIS — G479 Sleep disorder, unspecified: Secondary | ICD-10-CM

## 2024-09-08 DIAGNOSIS — R519 Headache, unspecified: Secondary | ICD-10-CM

## 2024-09-08 DIAGNOSIS — R5383 Other fatigue: Secondary | ICD-10-CM

## 2024-09-08 DIAGNOSIS — E559 Vitamin D deficiency, unspecified: Secondary | ICD-10-CM

## 2024-09-08 NOTE — Patient Instructions (Signed)
 [facility] for genetic testing

## 2024-09-08 NOTE — Progress Notes (Signed)
 Subjective:    Patient ID: Alicia Cox, female    DOB: December 05, 1971, 53 y.o.   MRN: [id number]  Chief Complaint  Patient presents with   Medical Management of Chronic Issues    Patient presents today for a 6 month follow-up.    HPI Discussed the use of AI scribe software for clinical note transcription with the patient, who gave verbal consent to proceed.  History of Present Illness Alicia Cox is a 53 year old female with hypertension and chronic headaches who presents for management of blood pressure and evaluation of chronic fatigue and headaches.  She has been on blood pressure medication but has not been consistently monitoring her blood pressure at home. Her blood pressure tends to rise during clinic visits, and she has not been consistently monitoring her blood pressure at home. Stress, lack of sleep, dehydration, and high salt intake may contribute to her elevated blood pressure.  She has experienced chronic headaches since age [age], occurring daily and persisting throughout the day. She differentiates between migraines and regular headaches, taking medication only when the pain is unbearable to avoid overuse of ibuprofen. These headaches significantly impact her daily life.  She experiences chronic fatigue, feeling tired throughout the day despite adequate sleep. She reports body aches, particularly in her legs, arms, shoulders, and back, especially after physical activity. She wakes up feeling as if she has been 'beaten in her sleep' without visible bruises. She has not undergone a sleep study.  Her family history includes her mother, who had sleep apnea and passed away from stage five kidney failure, and her father, who has stage four kidney failure, congestive heart failure, and COPD. There is a family history of diabetes and COPD.  She has a history of Hashimoto's thyroiditis and has considered the possibility of fibromyalgia due to her symptoms of fatigue and body  aches. She takes Zyrtec, Flonase, and Astelin  for allergy-related symptoms and uses asthma medication during episodes of coughing, particularly during flu or allergy seasons. She maintains a daily intake of vitamin D  and magnesium glycinate, and occasionally takes fish oil and multivitamins.    Past Medical History:  Diagnosis Date   Acid reflux    Acute bronchitis 11/03/2013   Allergy    hay fever   Anxiety    Arthritis    Asthma    Back pain    Blood in stool    Cervical cancer screening 09/14/2016   Menarche at 13 Regular and heavy flow no history of abnormal pap in past G2P2, s/p 2 svd No history of abnormal MGM No concerns today  gyn surgeries: tubal and endometrial ablation LMP 09/13/2016   Chest pain 08/24/2013   Chicken pox as a child   Depression    Dermatitis 03/02/2016   Frequent headaches    Gallbladder problem    GERD (gastroesophageal reflux disease)    Hashimoto's disease    Heart murmur    History of blood clots    Hypothyroidism    Hashimoto's   IBS (irritable bowel syndrome)    Joint pain    Knee pain, left 01/09/2014   Low back pain 08/26/2017   Migraine    Obesity 11/15/2014   Preventative health care 09/12/2015   Rheumatoid arthritis (HCC)    Sinus complaint    SOBOE (shortness of breath on exertion)    TOS (thoracic outlet syndrome) 08/25/2013   UTI (urinary tract infection)    Vitamin D  deficiency     Past Surgical  History:  Procedure Laterality Date   ABLATION  2009   APPENDECTOMY  1994   CESAREAN SECTION  01/10/1993   CESAREAN SECTION  04/21/1998   CHOLECYSTECTOMY  2011   ENDOMETRIAL ABLATION     TONSILLECTOMY  1983   TUBAL LIGATION  1999    Family History  Problem Relation Age of Onset   Kidney disease Mother    Cancer Mother        skin cancer   Hyperlipidemia Mother    Hypertension Mother    Skin cancer Mother    Diabetes Father        type 2   COPD Sister        genetic   Heart disease Sister        CHF, CAD s/p stenting    Kidney disease Sister    Cancer Sister        BCC   Hypertension Sister    Diabetes Sister    Asthma Daughter    COPD Paternal Aunt    Heart disease Maternal Grandmother    Diabetes Maternal Grandmother    Heart attack Maternal Grandfather    Diabetes Maternal Grandfather    Diabetes Paternal Grandmother    Diabetes Paternal Grandfather     Social History   Socioeconomic History   Marital status: Married    Spouse name: Clytie Shetley   Number of children: 2   Years of education: Not on file   Highest education level: Associate degree: academic program  Occupational History   Occupation: Runner, broadcasting/film/video at [employer]    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  Tobacco Use   Smoking status: Never   Smokeless tobacco: Never   Tobacco comments:    NEVER USED TOBACCO  Vaping Use   Vaping status: Never Used  Substance and Sexual Activity   Alcohol use: No    Alcohol/week: 0.0 standard drinks of alcohol   Drug use: No   Sexual activity: Yes    Partners: Male    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, no dietary restrictions, 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  Other Topics Concern   Not on file  Social History Narrative   Not on file   Social Drivers of Health   Financial Resource Strain: Low Risk  (03/11/2023)   Overall Financial Resource Strain (CARDIA)    Difficulty of Paying Living Expenses: Not very hard  Food Insecurity: No Food Insecurity (03/11/2023)   Hunger Vital Sign    Worried About Running Out of Food in the Last Year: Never true    Ran Out of Food in the Last Year: Never true  Transportation Needs: No Transportation Needs (03/11/2023)   PRAPARE - Administrator, Civil Service (Medical): No    Lack of Transportation (Non-Medical): No  Physical Activity: Insufficiently Active (03/11/2023)   Exercise Vital Sign    Days of Exercise per Week: 3 days    Minutes of Exercise per Session: 20 min  Stress: No Stress Concern Present (03/11/2023)   [employer] of  Occupational Health - Occupational Stress Questionnaire    Feeling of Stress : Only a little  Social Connections: Moderately Integrated (03/11/2023)   Social Connection and Isolation Panel    Frequency of Communication with Friends and Family: Twice a week    Frequency of Social Gatherings with Friends and Family: Once a week    Attends Religious Services: More than 4 times per year    Active Member of Clubs or Organizations: No  Attends Banker Meetings: Not on file    Marital Status: Married  Catering manager Violence: Not on file    Outpatient Medications Prior to Visit  Medication Sig Dispense Refill   albuterol  (VENTOLIN  HFA) 108 (90 Base) MCG/ACT inhaler Inhale 2 puffs into the lungs every 6 (six) hours as needed for wheezing or shortness of breath. 18 g 5   aspirin 81 MG EC tablet Take by mouth.     azelastine  (ASTELIN ) 0.1 % nasal spray Place 1 spray into both nostrils daily. Use in each nostril as directed 30 mL 12   budesonide-formoterol (SYMBICORT) 160-4.5 MCG/ACT inhaler Inhale 1 puff into the lungs 2 (two) times daily. 30.6 g 0   buPROPion  (WELLBUTRIN  XL) 300 MG 24 hr tablet Take 1 tablet by mouth once daily 90 tablet 0   dicyclomine  (BENTYL ) 10 MG capsule Take 1 capsule (10 mg total) by mouth 3 (three) times daily as needed for spasms. 90 capsule 1   escitalopram  (LEXAPRO ) 10 MG tablet Take 1 tablet by mouth once daily 90 tablet 1   gabapentin  (NEURONTIN ) 300 MG capsule TAKE 1 CAPSULE BY MOUTH NIGHTLY 30 capsule 0   levothyroxine  (SYNTHROID ) 100 MCG tablet TAKE 1 TABLET BY MOUTH ONCE DAILY EXCEPT TAKE 1.5 TABLET ONCE DAILY ON THURSDAYS AND SUNDAYS. 105 tablet 0   montelukast  (SINGULAIR ) 10 MG tablet TAKE 1 TABLET BY MOUTH AT BEDTIME 90 tablet 1   tiZANidine  (ZANAFLEX ) 4 MG tablet TAKE 1 TABLET BY MOUTH AT BEDTIME 30 tablet 0   Vitamin D , Ergocalciferol , (DRISDOL ) 1.25 MG (50000 UNIT) CAPS capsule Take 1 capsule (50,000 Units total) by mouth every 7 (seven) days.  4 capsule 4   metFORMIN  (GLUCOPHAGE ) 500 MG tablet Take 1 tablet (500 mg total) by mouth daily with breakfast. (Patient not taking: Reported on 09/08/2024) 90 tablet 1   meloxicam  (MOBIC ) 7.5 MG tablet Take 1 tablet by mouth once daily 30 tablet 0   omeprazole  (PRILOSEC) 40 MG capsule Take 1 capsule (40 mg total) by mouth daily. 30 capsule 5   vitamin k  100 MCG tablet Take 1 tablet (100 mcg total) by mouth daily.     No facility-administered medications prior to visit.    Allergies  Allergen Reactions   Metformin  And Related Nausea Only    Review of Systems  Constitutional:  Positive for malaise/fatigue. Negative for fever.  HENT:  Negative for congestion.   Eyes:  Negative for blurred vision.  Respiratory:  Negative for shortness of breath.   Cardiovascular:  Negative for chest pain, palpitations and leg swelling.  Gastrointestinal:  Negative for abdominal pain, blood in stool and nausea.  Genitourinary:  Negative for dysuria and frequency.  Musculoskeletal:  Positive for joint pain and myalgias. Negative for falls.  Skin:  Negative for rash.  Neurological:  Negative for dizziness, loss of consciousness and headaches.  Endo/Heme/Allergies:  Negative for environmental allergies.  Psychiatric/Behavioral:  Negative for depression. The patient is nervous/anxious.        Objective:    Physical Exam Constitutional:      General: She is not in acute distress.    Appearance: Normal appearance. She is well-developed. She is not toxic-appearing.  HENT:     Head: Normocephalic and atraumatic.     Right Ear: External ear normal.     Left Ear: External ear normal.     Nose: Nose normal.  Eyes:     General:        Right eye: No discharge.  Left eye: No discharge.     Conjunctiva/sclera: Conjunctivae normal.  Neck:     Thyroid : No thyromegaly.  Cardiovascular:     Rate and Rhythm: Normal rate and regular rhythm.     Heart sounds: Normal heart sounds. No murmur  heard. Pulmonary:     Effort: Pulmonary effort is normal. No respiratory distress.     Breath sounds: Normal breath sounds.  Abdominal:     General: Bowel sounds are normal.     Palpations: Abdomen is soft.     Tenderness: There is no abdominal tenderness. There is no guarding.  Musculoskeletal:        General: Normal range of motion.     Cervical back: Neck supple.  Lymphadenopathy:     Cervical: No cervical adenopathy.  Skin:    General: Skin is warm and dry.  Neurological:     Mental Status: She is alert and oriented to person, place, and time.  Psychiatric:        Mood and Affect: Mood normal.        Behavior: Behavior normal.        Thought Content: Thought content normal.        Judgment: Judgment normal.     BP 138/86   Pulse 90   Temp 97.7 F (36.5 C)   Resp 16   Ht 5' 4 (1.626 m)   Wt 264 lb 6.4 oz (119.9 kg)   SpO2 96%   BMI 45.38 kg/m  Wt Readings from Last 3 Encounters:  09/08/24 264 lb 6.4 oz (119.9 kg)  02/28/24 249 lb (112.9 kg)  08/01/23 221 lb (100.2 kg)    Diabetic Foot Exam - Simple   No data filed    Lab Results  Component Value Date   WBC 9.7 03/05/2024   HGB 14.2 03/05/2024   HCT 41.8 03/05/2024   PLT 340.0 03/05/2024   GLUCOSE 118 (H) 03/05/2024   CHOL 165 03/05/2024   TRIG 169.0 (H) 03/05/2024   HDL 43.20 03/05/2024   LDLDIRECT 104.0 09/17/2017   LDLCALC 88 03/05/2024   ALT 24 03/05/2024   AST 17 03/05/2024   NA 137 03/05/2024   K 3.9 03/05/2024   CL 101 03/05/2024   CREATININE 0.69 03/05/2024   BUN 11 03/05/2024   CO2 29 03/05/2024   TSH 2.02 03/05/2024   HGBA1C 5.9 03/05/2024    Lab Results  Component Value Date   TSH 2.02 03/05/2024   Lab Results  Component Value Date   WBC 9.7 03/05/2024   HGB 14.2 03/05/2024   HCT 41.8 03/05/2024   MCV 99.9 03/05/2024   PLT 340.0 03/05/2024   Lab Results  Component Value Date   NA 137 03/05/2024   K 3.9 03/05/2024   CO2 29 03/05/2024   GLUCOSE 118 (H) 03/05/2024   BUN  11 03/05/2024   CREATININE 0.69 03/05/2024   BILITOT 0.3 03/05/2024   ALKPHOS 55 03/05/2024   AST 17 03/05/2024   ALT 24 03/05/2024   PROT 7.1 03/05/2024   ALBUMIN 4.2 03/05/2024   CALCIUM 8.9 03/05/2024   ANIONGAP 7 04/07/2020   EGFR 97 07/17/2022   GFR 100.07 03/05/2024   Lab Results  Component Value Date   CHOL 165 03/05/2024   Lab Results  Component Value Date   HDL 43.20 03/05/2024   Lab Results  Component Value Date   LDLCALC 88 03/05/2024   Lab Results  Component Value Date   TRIG 169.0 (H) 03/05/2024   Lab Results  Component Value Date   CHOLHDL 4 03/05/2024   Lab Results  Component Value Date   HGBA1C 5.9 03/05/2024       Assessment & Plan:  Class 3 severe obesity with serious comorbidity and body mass index (BMI) of 40.0 to 44.9 in adult Assessment & Plan: consider DASH or MIND diet, she is following with [facility] now, decrease po intake and increase exercise as tolerated. Needs 7-8 hours of sleep nightly. Avoid trans fats, eat small, frequent meals every 4-5 hours with lean proteins, complex carbs and healthy fats. Minimize simple carbs, high fat foods and processed foods   Hyperglycemia Assessment & Plan: hgba1c acceptable, minimize simple carbs. Increase exercise as tolerated.   Orders: -     Comprehensive metabolic panel with GFR; Future -     Hemoglobin A1c; Future  Hyperlipidemia, mixed Assessment & Plan: Encourage heart healthy diet such as MIND or DASH diet, increase exercise, avoid trans fats, simple carbohydrates and processed foods, consider a krill or fish or flaxseed oil cap daily.   Orders: -     Lipid panel; Future  Hypothyroidism due to Hashimoto thyroiditis Assessment & Plan: On Levothyroxine , continue to monitor  Orders: -     TSH; Future  Morbid obesity (HCC)-start bmi 43.26 Assessment & Plan: Encouraged DASH or MIND diet, decrease po intake and increase exercise as tolerated. Needs 7-8 hours of sleep nightly. Avoid trans  fats, eat small, frequent meals every 4-5 hours with lean proteins, complex carbs and healthy fats. Minimize simple carbs, high fat foods and processed foods    Mood disorder Assessment & Plan: Stable on current meds   Other fatigue -     Pulmonary Visit; Future  Morning headache -     Pulmonary Visit; Future -     CBC with Differential/Platelet; Future  Snoring -     Pulmonary Visit; Future  Restless sleeper -     Pulmonary Visit; Future  Vitamin D  deficiency -     Vitamin D  1,25 dihydroxy; Future    Assessment and Plan Assessment & Plan Adult Wellness Visit Routine wellness visit with emphasis on general health maintenance. - Encourage regular mammograms and colonoscopy as per schedule. - Promote regular physical activity and stress management. - Discuss the importance of hydration, sleep, and a balanced diet.  Hypertension Blood pressure management discussed with emphasis on home monitoring and lifestyle factors such as salt intake, sleep, and stress. Chronic dehydration linked to increased risk of dementia and hypertension. - Encourage home blood pressure monitoring a couple of times a week. - Advise on lifestyle modifications including reducing salt intake, ensuring adequate sleep, and managing stress. - Emphasize the importance of hydration to reduce risk of hypertension and dementia.  Chronic daily headache Chronic headaches since age 4, occurring daily. Differentiated between migraines and regular headaches. Avoids frequent use of ibuprofen to prevent stomach and kidney issues. - Consider non-pharmacological approaches such as hydration and stress management. - Discuss potential triggers and lifestyle modifications.  Fatigue and myalgias/arthralgias Chronic fatigue and body aches, possibly related to sleep issues or fibromyalgia. Potential link with Hashimoto's and challenges in diagnosing fibromyalgia. - Encourage lifestyle modifications including hydration,  balanced diet, and regular physical activity. - Consider magnesium supplementation for muscle pain and sleep improvement.  Suspected obstructive sleep apnea Symptoms include morning headaches, fatigue, snoring, and dry mouth. Family history of sleep apnea. - Refer to pulmonology for a sleep study.  Hypothyroidism due to Hashimoto thyroiditis  Obesity Weight management discussed with previous intolerance  to metformin  due to nausea. Potential future options for weight loss medications discussed, including insurance coverage and availability of generic Saxenda. - Monitor for potential availability of generic Saxenda for weight management. - Encourage lifestyle modifications including diet and exercise.  Recording duration: 46 minutes     Harlene Horton, MD

## 2024-09-16 NOTE — Progress Notes (Unsigned)
 [facility] [address] [place] [address] Phone: [phone] Subjective:   ISusannah Cox, am serving as a scribe for Dr. Arthea Claudene.  I'm seeing this patient by the request  of:  Alicia Harlene LABOR, MD  CC: Bilateral knee pain  YEP:Dlagzrupcz  08/01/2023 Likely did have a large injury at some point does have some lateral column overload with some instability.  Discussed proper home exercises, proper shoes, recovery sandals.  Worsening pain consider injections or imaging.  Follow-up again in 6 to 8 weeks    Has responded well to osteopathic inoculation of the past. Attempted again today. Discussed posture and ergonomics. Discussed worsening symptoms advanced imaging and potential injections may be necessary. No change in medications. Follow-up again in 6 to 8 weeks  Decision today to treat with OMT was based on Physical Exam   After verbal consent patient was treated with HVLA, ME, FPR techniques in cervical, thoracic, rib, lumbar and sacral areas, all areas are chronic    Patient tolerated the procedure well with improvement in symptoms   Patient given exercises, stretches and lifestyle modifications   See medications in patient instructions if given   Patient will follow up in 4-8 weeks  Updated 09/17/2024 Alicia Cox is a 53 y.o. female coming in with complaint of knee pain. B knee pain, but L is worse than R. No other concerns. Knees have hurt her more since she is gaining weight.  Patient has not been taking care of herself because patient's mother was on hospice for quite significant amount of time.      Past Medical History:  Diagnosis Date   Acid reflux    Acute bronchitis 11/03/2013   Allergy    hay fever   Anxiety    Arthritis    Asthma    Back pain    Blood in stool    Cervical cancer screening 09/14/2016   Menarche at 13 Regular and heavy flow no history of abnormal pap in past G2P2, s/p 2 svd No history of  abnormal MGM No concerns today  gyn surgeries: tubal and endometrial ablation LMP 09/13/2016   Chest pain 08/24/2013   Chicken pox as a child   Depression    Dermatitis 03/02/2016   Frequent headaches    Gallbladder problem    GERD (gastroesophageal reflux disease)    Hashimoto's disease    Heart murmur    History of blood clots    Hypothyroidism    Hashimoto's   IBS (irritable bowel syndrome)    Joint pain    Knee pain, left 01/09/2014   Low back pain 08/26/2017   Migraine    Obesity 11/15/2014   Preventative health care 09/12/2015   Rheumatoid arthritis (HCC)    Sinus complaint    SOBOE (shortness of breath on exertion)    TOS (thoracic outlet syndrome) 08/25/2013   UTI (urinary tract infection)    Vitamin D  deficiency    Past Surgical History:  Procedure Laterality Date   ABLATION  2009   APPENDECTOMY  1994   CESAREAN SECTION  01/10/1993   CESAREAN SECTION  04/21/1998   CHOLECYSTECTOMY  2011   ENDOMETRIAL ABLATION     TONSILLECTOMY  1983   TUBAL LIGATION  1999   Social History   Socioeconomic History   Marital status: Married    Spouse name: Alicia Cox   Number of children: 2   Years of education: Not on file   Highest education  level: Associate degree: academic program  Occupational History   Occupation: Runner, broadcasting/film/video at [employer]    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  Tobacco Use   Smoking status: Never   Smokeless tobacco: Never   Tobacco comments:    NEVER USED TOBACCO  Vaping Use   Vaping status: Never Used  Substance and Sexual Activity   Alcohol use: No    Alcohol/week: 0.0 standard drinks of alcohol   Drug use: No   Sexual activity: Yes    Partners: Male    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, no dietary restrictions, 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  Other Topics Concern   Not on file  Social History Narrative   Not on file   Social Drivers of Health   Financial Resource Strain: Low Risk  (03/11/2023)   Overall Financial Resource Strain (CARDIA)     Difficulty of Paying Living Expenses: Not very hard  Food Insecurity: No Food Insecurity (03/11/2023)   Hunger Vital Sign    Worried About Running Out of Food in the Last Year: Never true    Ran Out of Food in the Last Year: Never true  Transportation Needs: No Transportation Needs (03/11/2023)   PRAPARE - Administrator, Civil Service (Medical): No    Lack of Transportation (Non-Medical): No  Physical Activity: Insufficiently Active (03/11/2023)   Exercise Vital Sign    Days of Exercise per Week: 3 days    Minutes of Exercise per Session: 20 min  Stress: No Stress Concern Present (03/11/2023)   [employer] of Occupational Health - Occupational Stress Questionnaire    Feeling of Stress : Only a little  Social Connections: Moderately Integrated (03/11/2023)   Social Connection and Isolation Panel    Frequency of Communication with Friends and Family: Twice a week    Frequency of Social Gatherings with Friends and Family: Once a week    Attends Religious Services: More than 4 times per year    Active Member of [employer] or Organizations: No    Attends Engineer, structural: Not on file    Marital Status: Married   Allergies  Allergen Reactions   Metformin  And Related Nausea Only   Family History  Problem Relation Age of Onset   Kidney disease Mother    Cancer Mother        skin cancer   Hyperlipidemia Mother    Hypertension Mother    Skin cancer Mother    Diabetes Father        type 2   COPD Sister        genetic   Heart disease Sister        CHF, CAD s/p stenting   Kidney disease Sister    Cancer Sister        BCC   Hypertension Sister    Diabetes Sister    Asthma Daughter    COPD Paternal Aunt    Heart disease Maternal Grandmother    Diabetes Maternal Grandmother    Heart attack Maternal Grandfather    Diabetes Maternal Grandfather    Diabetes Paternal Grandmother    Diabetes Paternal Grandfather     Current Outpatient Medications (Endocrine &  Metabolic):    levothyroxine  (SYNTHROID ) 100 MCG tablet, TAKE 1 TABLET BY MOUTH ONCE DAILY EXCEPT TAKE 1.5 TABLET ONCE DAILY ON THURSDAYS AND SUNDAYS.   metFORMIN  (GLUCOPHAGE ) 500 MG tablet, Take 1 tablet (500 mg total) by mouth daily with breakfast. (Patient not taking: Reported on 09/08/2024)  Current Outpatient Medications (Respiratory):    albuterol  (VENTOLIN  HFA) 108 (90 Base) MCG/ACT inhaler, Inhale 2 puffs into the lungs every 6 (six) hours as needed for wheezing or shortness of breath.   azelastine  (ASTELIN ) 0.1 % nasal spray, Place 1 spray into both nostrils daily. Use in each nostril as directed   budesonide-formoterol (SYMBICORT) 160-4.5 MCG/ACT inhaler, Inhale 1 puff into the lungs 2 (two) times daily.   montelukast  (SINGULAIR ) 10 MG tablet, TAKE 1 TABLET BY MOUTH AT BEDTIME  Current Outpatient Medications (Analgesics):    aspirin 81 MG EC tablet, Take by mouth.   Current Outpatient Medications (Other):    buPROPion  (WELLBUTRIN  XL) 300 MG 24 hr tablet, Take 1 tablet by mouth once daily   dicyclomine  (BENTYL ) 10 MG capsule, Take 1 capsule (10 mg total) by mouth 3 (three) times daily as needed for spasms.   escitalopram  (LEXAPRO ) 10 MG tablet, Take 1 tablet by mouth once daily   gabapentin  (NEURONTIN ) 300 MG capsule, Take 1 capsule (300 mg total) by mouth at bedtime.   tiZANidine  (ZANAFLEX ) 4 MG tablet, Take 1 tablet (4 mg total) by mouth at bedtime.   Vitamin D , Ergocalciferol , (DRISDOL ) 1.25 MG (50000 UNIT) CAPS capsule, Take 1 capsule (50,000 Units total) by mouth every 7 (seven) days.   Reviewed prior external information including notes and imaging from  primary care provider As well as notes that were available from care everywhere and other healthcare systems.  Past medical history, social, surgical and family history all reviewed in electronic medical record.  No pertanent information unless stated regarding to the chief complaint.   Review of Systems:  No headache,  visual changes, nausea, vomiting, diarrhea, constipation, dizziness, abdominal pain, skin rash, fevers, chills, night sweats, weight loss, swollen lymph nodes, body aches, joint swelling, chest pain, shortness of breath, mood changes. POSITIVE muscle aches  Objective  Blood pressure 130/80, pulse 84, height 5' 4 (1.626 m), weight 262 lb (118.8 kg), SpO2 97%.   General: No apparent distress alert and oriented x3 mood and affect normal, dressed appropriately.  HEENT: Pupils equal, extraocular movements intact  Respiratory: Patient's speak in full sentences and does not appear short of breath  Cardiovascular: No lower extremity edema, non tender, no erythema  Knees do have some trace effusion noted left greater than right.  Some instability with valgus and varus force.  After informed written and verbal consent, patient was seated on exam table. Right knee was prepped with alcohol swab and utilizing anterolateral approach, patient's right knee space was injected with 4:1  marcaine 0.5%: Kenalog  40mg /dL. Patient tolerated the procedure well without immediate complications.  After informed written and verbal consent, patient was seated on exam table. Left knee was prepped with alcohol swab and utilizing anterolateral approach, patient's left knee space was injected with 4:1  marcaine 0.5%: Kenalog  40mg /dL. Patient tolerated the procedure well without immediate complications.    Impression and Recommendations:     The above documentation has been reviewed and is accurate and complete Shiah Berhow M Joden Bonsall, DO

## 2024-09-17 ENCOUNTER — Encounter: Payer: Self-pay | Admitting: Family Medicine

## 2024-09-17 ENCOUNTER — Ambulatory Visit: Admitting: Family Medicine

## 2024-09-17 ENCOUNTER — Other Ambulatory Visit

## 2024-09-17 ENCOUNTER — Ambulatory Visit

## 2024-09-17 VITALS — BP 130/80 | HR 84 | Ht 64.0 in | Wt 262.0 lb

## 2024-09-17 DIAGNOSIS — M170 Bilateral primary osteoarthritis of knee: Secondary | ICD-10-CM | POA: Diagnosis not present

## 2024-09-17 DIAGNOSIS — M25561 Pain in right knee: Secondary | ICD-10-CM | POA: Diagnosis not present

## 2024-09-17 DIAGNOSIS — E559 Vitamin D deficiency, unspecified: Secondary | ICD-10-CM

## 2024-09-17 DIAGNOSIS — R519 Headache, unspecified: Secondary | ICD-10-CM

## 2024-09-17 DIAGNOSIS — M25562 Pain in left knee: Secondary | ICD-10-CM

## 2024-09-17 DIAGNOSIS — M542 Cervicalgia: Secondary | ICD-10-CM

## 2024-09-17 DIAGNOSIS — M545 Low back pain, unspecified: Secondary | ICD-10-CM

## 2024-09-17 DIAGNOSIS — E063 Autoimmune thyroiditis: Secondary | ICD-10-CM | POA: Diagnosis not present

## 2024-09-17 DIAGNOSIS — R739 Hyperglycemia, unspecified: Secondary | ICD-10-CM | POA: Diagnosis not present

## 2024-09-17 DIAGNOSIS — E782 Mixed hyperlipidemia: Secondary | ICD-10-CM

## 2024-09-17 DIAGNOSIS — G8929 Other chronic pain: Secondary | ICD-10-CM

## 2024-09-17 DIAGNOSIS — M79622 Pain in left upper arm: Secondary | ICD-10-CM

## 2024-09-17 MED ORDER — TIZANIDINE HCL 4 MG PO TABS: 4.0000 mg | ORAL_TABLET | Freq: Every day | ORAL | 3 refills | Status: AC

## 2024-09-17 MED ORDER — GABAPENTIN 300 MG PO CAPS: 300.0000 mg | ORAL_CAPSULE | Freq: Every day | ORAL | 3 refills | Status: AC

## 2024-09-17 NOTE — Assessment & Plan Note (Signed)
 Chronic problem with worsening symptoms with patient recently gaining weight.  Wanting to BMI under 50.  X-rays ordered today.  Patient recently did get labs by primary care provider as well and will await some of those to see if anything else needs to change such as vitamin D  supplementation.  Patient is also being worked up for potential sleep apnea which I think could be increasing some of her pain as well.  Discussed icing regimen and home exercises otherwise.  Could be a candidate for viscosupplementation and will have a follow-up in 2 months.  Nighttime pain refilled patient's gabapentin  300 mg as well as Zanaflex  4 mg as needed

## 2024-09-17 NOTE — Patient Instructions (Signed)
 Xrays today [facility] See you again in 2 months

## 2024-09-18 ENCOUNTER — Ambulatory Visit: Payer: Self-pay | Admitting: Family Medicine

## 2024-09-18 LAB — CBC WITH DIFFERENTIAL/PLATELET
Basophils Absolute: 0.2 K/uL — ABNORMAL HIGH (ref 0.0–0.1)
Basophils Relative: 1.9 % (ref 0.0–3.0)
Eosinophils Absolute: 0.2 K/uL (ref 0.0–0.7)
Eosinophils Relative: 2.4 % (ref 0.0–5.0)
HCT: 42.9 % (ref 36.0–46.0)
Hemoglobin: 14.4 g/dL (ref 12.0–15.0)
Lymphocytes Relative: 23.3 % (ref 12.0–46.0)
Lymphs Abs: 2.4 K/uL (ref 0.7–4.0)
MCHC: 33.7 g/dL (ref 30.0–36.0)
MCV: 98.5 fl (ref 78.0–100.0)
Monocytes Absolute: 0.6 K/uL (ref 0.1–1.0)
Monocytes Relative: 6.1 % (ref 3.0–12.0)
Neutro Abs: 6.8 K/uL (ref 1.4–7.7)
Neutrophils Relative %: 66.3 % (ref 43.0–77.0)
Platelets: 314 K/uL (ref 150.0–400.0)
RBC: 4.35 Mil/uL (ref 3.87–5.11)
RDW: 12.6 % (ref 11.5–15.5)
WBC: 10.3 K/uL (ref 4.0–10.5)

## 2024-09-18 LAB — HEMOGLOBIN A1C: Hgb A1c MFr Bld: 6.1 % (ref 4.6–6.5)

## 2024-09-18 LAB — COMPREHENSIVE METABOLIC PANEL WITH GFR
ALT: 18 U/L (ref 0–35)
AST: 17 U/L (ref 0–37)
Albumin: 4.3 g/dL (ref 3.5–5.2)
Alkaline Phosphatase: 53 U/L (ref 39–117)
BUN: 13 mg/dL (ref 6–23)
CO2: 23 meq/L (ref 19–32)
Calcium: 9.5 mg/dL (ref 8.4–10.5)
Chloride: 103 meq/L (ref 96–112)
Creatinine, Ser: 0.7 mg/dL (ref 0.40–1.20)
GFR: 99.35 mL/min (ref >60.00)
Glucose, Bld: 102 mg/dL — ABNORMAL HIGH (ref 70–99)
Potassium: 3.8 meq/L (ref 3.5–5.1)
Sodium: 137 meq/L (ref 135–145)
Total Bilirubin: 0.3 mg/dL (ref 0.2–1.2)
Total Protein: 7.4 g/dL (ref 6.0–8.3)

## 2024-09-18 LAB — LIPID PANEL
Cholesterol: 149 mg/dL (ref 0–200)
HDL: 37.2 mg/dL — ABNORMAL LOW (ref >39.00)
LDL Cholesterol: 78 mg/dL (ref 0–99)
NonHDL: 111.53
Total CHOL/HDL Ratio: 4
Triglycerides: 169 mg/dL — ABNORMAL HIGH (ref 0.0–149.0)
VLDL: 33.8 mg/dL (ref 0.0–40.0)

## 2024-09-18 LAB — TSH: TSH: 2.37 u[IU]/mL (ref 0.35–5.50)

## 2024-09-19 ENCOUNTER — Other Ambulatory Visit: Payer: Self-pay | Admitting: Family Medicine

## 2024-09-19 DIAGNOSIS — E039 Hypothyroidism, unspecified: Secondary | ICD-10-CM

## 2024-09-22 LAB — VITAMIN D 1,25 DIHYDROXY

## 2024-09-24 ENCOUNTER — Encounter: Payer: Self-pay | Admitting: Nurse Practitioner

## 2024-09-24 ENCOUNTER — Ambulatory Visit: Admitting: Nurse Practitioner

## 2024-09-24 VITALS — BP 128/80 | HR 84 | Temp 98.2°F | Ht 64.0 in | Wt 260.6 lb

## 2024-09-24 DIAGNOSIS — R058 Other specified cough: Secondary | ICD-10-CM | POA: Diagnosis not present

## 2024-09-24 DIAGNOSIS — E66813 Obesity, class 3: Secondary | ICD-10-CM

## 2024-09-24 DIAGNOSIS — R0683 Snoring: Secondary | ICD-10-CM | POA: Diagnosis not present

## 2024-09-24 DIAGNOSIS — G4719 Other hypersomnia: Secondary | ICD-10-CM | POA: Diagnosis not present

## 2024-09-24 DIAGNOSIS — Z6841 Body Mass Index (BMI) 40.0 and over, adult: Secondary | ICD-10-CM

## 2024-09-24 DIAGNOSIS — J4530 Mild persistent asthma, uncomplicated: Secondary | ICD-10-CM | POA: Diagnosis not present

## 2024-09-24 LAB — POCT EXHALED NITRIC OXIDE: FeNO level (ppb): 7

## 2024-09-24 NOTE — Assessment & Plan Note (Signed)
 Does not appear to be in acute asthma exacerbation. Lung exam clear. Exhaled nitric oxide  testing normal, indicating no significant airway inflammation. Will treat with cough control and supportive care. Encouraged to continue allergy regimen.

## 2024-09-24 NOTE — Patient Instructions (Addendum)
 Given your symptoms, I am concerned that you may have sleep disordered breathing with sleep apnea. You will need a sleep study for further evaluation. Someone will contact you to schedule this.   We discussed how untreated sleep apnea puts an individual at risk for cardiac arrhthymias, pulm HTN, DM, stroke and increases their risk for daytime accidents. We also briefly reviewed treatment options including weight loss, side sleeping position, oral appliance, CPAP therapy or referral to ENT for possible surgical options  Use caution when driving and pull over if you become sleepy.  Continue Albuterol  inhaler 2 puffs every 6 hours as needed for shortness of breath or wheezing. Notify if symptoms persist despite rescue inhaler/neb use.  Continue Symbicot 2 puffs Twice daily. Brush tongue and rinse mouth afterwards  Continue montelukast  1 tab daily  Continue flonase nasal spray 2 sprays each nostril daily  No significant inflammation in your airways and lungs sound good today I think most of the cough is related to postnasal drainage You can use benzonatate  1 capsule Three times a day as needed for cough Delsym 2 tsp Twice daily as needed for cough Non menthol throat lozenges and avoid throat clearing   Follow up in 6 weeks with Katie Sky Borboa,NP to go over sleep study results, or sooner, if needed. Friday PM virtual clinic preferred

## 2024-09-24 NOTE — Assessment & Plan Note (Signed)
 Healthy weight loss encouraged. If she does have moderate to severe OSA, would be an appropriate candidate for GLP-1 therapy with Zepbound. Will readdress at follow up.

## 2024-09-24 NOTE — Progress Notes (Signed)
 @Patient  ID: Alicia Cox, female    DOB: 1971-08-31, 53 y.o.   MRN: [id number]  Chief Complaint  Patient presents with   Consult    Headaches in morning, snoring.    Referring provider: Domenica Harlene LABOR, MD  HPI: 53 year old female, never smoker followed for chronic cough/bronchitis suspected to be related to asthma/UACS and allergic rhinitis. She is a patient of Dr. Correne and last seen in office on 05/09/2023. Past medical history significant for migraines, GERD, hypothyroidism, anxiety, obesity, vit D deficiency.   TEST/EVENTS:  02/06/2022 office spirometry: FVC 2.8 (78), FEV1 2.3 (81), ratio 82 02/22/2022 CXR 2 view: both lungs were clear. There was no evidence of acute process.   05/09/2023: OV with Dr. Meade. One year f/u. On Advair Twice daily. No albuterol  use. Had the flu in the winter. On flonase bid. Saline spray and zyrtec bid. Singulair  use. Still having allergic rhinitis. Has lost 70 lb. Can decrease Advair to once daily.   09/24/2024: Today - follow up Discussed the use of AI scribe software for clinical note transcription with the patient, who gave verbal consent to proceed.  History of Present Illness Alicia Cox is a 53 year old female who presents for a sleep consult due to morning headaches and snoring.  She experiences morning headaches and snoring at night, accompanied by persistent daytime fatigue. She feels tired throughout the day. She has not undergone sleep testing in approximately 20 years, at which time no issues were identified. She notes significant weight fluctuations, having lost 70 pounds and then regained 60 pounds over the last year. She reports that her symptoms are worse when her weight is higher.  She denies issues with drowsy driving, sleepwalking, or sleep paralysis. She wakes up frequently at night and takes about 30 minutes to fall asleep, which she acknowledges as typical for her. She consumes two cups of coffee daily and drinks water  otherwise. No alcohol intake.  Her blood pressure has increased over the past six months as well.   Goes to bed between 9-10 pm. Wakes 2-3 times a night. Gets up around 530 am. 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   Regarding her asthma, she uses Symbicort and Flonase nasal spray regularly. She uses her albuterol  inhaler about three to four times a day right now. Her asthma symptoms are exacerbated by allergies, which she feels has been worse recently. She has a daily dry cough, that's bothersome. Still has nasal drainage. Worse since fall allergy season started. No wheezing or chest tightness. No fevers, hemoptysis. Breathing is ok.   FeNO 7 ppb - normal   Epworth 10     Allergies  Allergen Reactions   Metformin  And Related Nausea Only    Immunization History  Administered Date(s) Administered   Influenza, Seasonal, Injecte, Preservative Fre 10/21/2013   Influenza,inj,Quad PF,6+ Mos 10/21/2013, 08/14/2014, 09/02/2015, 09/14/2016, 09/17/2017, 09/26/2019   Influenza-Unspecified 09/24/2018   PFIZER(Purple Top)SARS-COV-2 Vaccination 07/30/2020, 08/20/2020   Tdap 10/21/2013, 03/05/2024   Zoster Recombinant(Shingrix) 07/03/2023, 10/03/2023    Past Medical History:  Diagnosis Date   Acid reflux    Acute bronchitis 11/03/2013   Allergy    hay fever   Anxiety    Arthritis    Asthma    Back pain    Blood in stool    Cervical cancer screening 09/14/2016   Menarche at 13 Regular and heavy flow no history of abnormal pap in past G2P2, s/p 2 svd No history of abnormal  MGM No concerns today  gyn surgeries: tubal and endometrial ablation LMP 09/13/2016   Chest pain 08/24/2013   Chicken pox as a child   Depression    Dermatitis 03/02/2016   Frequent headaches    Gallbladder problem    GERD (gastroesophageal reflux disease)    Hashimoto's disease    Heart murmur    History of blood clots    Hypothyroidism    Hashimoto's   IBS (irritable bowel syndrome)    Joint pain    Knee  pain, left 01/09/2014   Low back pain 08/26/2017   Migraine    Obesity 11/15/2014   Preventative health care 09/12/2015   Rheumatoid arthritis (HCC)    Sinus complaint    SOBOE (shortness of breath on exertion)    TOS (thoracic outlet syndrome) 08/25/2013   UTI (urinary tract infection)    Vitamin D  deficiency     Tobacco History: Social History   Tobacco Use  Smoking Status Never  Smokeless Tobacco Never  Tobacco Comments   NEVER USED TOBACCO   Counseling given: Not Answered Tobacco comments: NEVER USED TOBACCO   Outpatient Medications Prior to Visit  Medication Sig Dispense Refill   albuterol  (VENTOLIN  HFA) 108 (90 Base) MCG/ACT inhaler Inhale 2 puffs into the lungs every 6 (six) hours as needed for wheezing or shortness of breath. 18 g 5   aspirin 81 MG EC tablet Take by mouth.     azelastine  (ASTELIN ) 0.1 % nasal spray Place 1 spray into both nostrils daily. Use in each nostril as directed 30 mL 12   budesonide-formoterol (SYMBICORT) 160-4.5 MCG/ACT inhaler Inhale 1 puff into the lungs 2 (two) times daily. 30.6 g 0   buPROPion  (WELLBUTRIN  XL) 300 MG 24 hr tablet Take 1 tablet by mouth once daily 90 tablet 0   dicyclomine  (BENTYL ) 10 MG capsule Take 1 capsule (10 mg total) by mouth 3 (three) times daily as needed for spasms. 90 capsule 1   escitalopram  (LEXAPRO ) 10 MG tablet Take 1 tablet by mouth once daily 90 tablet 1   gabapentin  (NEURONTIN ) 300 MG capsule Take 1 capsule (300 mg total) by mouth at bedtime. 90 capsule 3   levothyroxine  (SYNTHROID ) 100 MCG tablet TAKE 1 TABLET BY MOUTH ONCE DAILY EXCEPT  TAKE  1  1/2  TABLETS  ON  THURSDAYS  AND  SUNDAYS 105 tablet 1   montelukast  (SINGULAIR ) 10 MG tablet TAKE 1 TABLET BY MOUTH AT BEDTIME 90 tablet 1   tiZANidine  (ZANAFLEX ) 4 MG tablet Take 1 tablet (4 mg total) by mouth at bedtime. 90 tablet 3   Vitamin D , Ergocalciferol , (DRISDOL ) 1.25 MG (50000 UNIT) CAPS capsule Take 1 capsule (50,000 Units total) by mouth every 7  (seven) days. 4 capsule 4   metFORMIN  (GLUCOPHAGE ) 500 MG tablet Take 1 tablet (500 mg total) by mouth daily with breakfast. (Patient not taking: Reported on 09/08/2024) 90 tablet 1   No facility-administered medications prior to visit.     Review of Systems: as above    Physical Exam:  BP 128/80   Pulse 84   Temp 98.2 F (36.8 C) (Oral)   Ht 5' 4 (1.626 m)   Wt 260 lb 9.6 oz (118.2 kg)   SpO2 97% Comment: room air  BMI 44.73 kg/m   GEN: Pleasant, interactive, well-appearing; obese; in no acute distress. HEENT:  Normocephalic and atraumatic.  PERRLA. Sclera white. Nasal turbinates pink, moist and patent bilaterally. No rhinorrhea present. Oropharynx pink and moist, without exudate or  edema. No lesions, ulcerations, or postnasal drip.  NECK:  Supple w/ fair ROM. No JVD present. No lymphadenopathy.   CV: RRR, no m/r/g, no peripheral edema. Pulses intact, +2 bilaterally. No cyanosis, pallor or clubbing. PULMONARY:  Unlabored, regular breathing. Clear bilaterally A&P w/o wheezes/rales/rhonchi. No accessory muscle use. No dullness to percussion. GI: BS present and normoactive. Soft, non-tender to palpation. MSK: No erythema, warmth or tenderness. Cap refil <2 sec all extrem.  Neuro: A/Ox3. No focal deficits noted.   Skin: Warm, no lesions or rashe Psych: Normal affect and behavior. Judgement and thought content appropriate.     Lab Results:  CBC    Component Value Date/Time   WBC 10.3 09/17/2024 1316   RBC 4.35 09/17/2024 1316   HGB 14.4 09/17/2024 1316   HGB 15.2 07/17/2022 1144   HGB 14.7 05/12/2015 0921   HCT 42.9 09/17/2024 1316   HCT 44.8 07/17/2022 1144   HCT 42.8 05/12/2015 0921   PLT 314.0 09/17/2024 1316   PLT 318 07/17/2022 1144   MCV 98.5 09/17/2024 1316   MCV 99 (H) 07/17/2022 1144   MCV 97 05/12/2015 0921   MCH 33.7 (H) 07/17/2022 1144   MCH 32.5 04/07/2020 1428   MCHC 33.7 09/17/2024 1316   RDW 12.6 09/17/2024 1316   RDW 11.5 (L) 07/17/2022 1144    RDW 12.2 05/12/2015 0921   LYMPHSABS 2.4 09/17/2024 1316   LYMPHSABS 1.7 07/17/2022 1144   LYMPHSABS 2.6 05/12/2015 0921   MONOABS 0.6 09/17/2024 1316   EOSABS 0.2 09/17/2024 1316   EOSABS 0.2 07/17/2022 1144   EOSABS 0.4 05/12/2015 0921   BASOSABS 0.2 (H) 09/17/2024 1316   BASOSABS 0.1 07/17/2022 1144   BASOSABS 0.1 05/12/2015 0921    BMET    Component Value Date/Time   NA 137 09/17/2024 1316   NA 142 07/17/2022 1144   K 3.8 09/17/2024 1316   CL 103 09/17/2024 1316   CO2 23 09/17/2024 1316   GLUCOSE 102 (H) 09/17/2024 1316   BUN 13 09/17/2024 1316   BUN 17 07/17/2022 1144   CREATININE 0.70 09/17/2024 1316   CREATININE 0.69 04/07/2020 1428   CREATININE 0.70 03/18/2020 1529   CALCIUM 9.5 09/17/2024 1316   GFRNONAA >60 04/07/2020 1428   GFRAA >60 04/07/2020 1428    BNP No results found for: BNP   Imaging:  DG Knee Bilateral Standing AP Result Date: 09/22/2024 EXAM: ANTEROPOSTERIOR (1) VIEW(S) XRAY OF THE BILATERAL KNEE 09/17/2024 01:54:56 PM COMPARISON: None available. CLINICAL HISTORY: knee pain. Intermittent bilateral knee pain x 10 years, NKI FINDINGS: BONES AND JOINTS: No acute fracture. No focal osseous lesion. No joint dislocation. No significant joint effusion. No significant degenerative changes. SOFT TISSUES: The soft tissues are unremarkable. IMPRESSION: 1. Normal AP standing radiograph of the bilateral knees. Electronically signed by: Selinda Blue MD 09/22/2024 11:24 AM EDT RP Workstation: [id number]    Administration History     None           No data to display          No results found for: NITRICOXIDE      Assessment & Plan:   Excessive daytime sleepiness She has snoring, excessive daytime sleepiness, restless sleep, morning headaches. BMI 44. History of HTN. Epworth 10. Given this,  I am concerned she could have sleep disordered breathing with obstructive sleep apnea. She will need sleep study for further evaluation.    - discussed  how weight can impact sleep and risk for sleep disordered breathing - discussed  options to assist with weight loss: combination of diet modification, cardiovascular and strength training exercises   - had an extensive discussion regarding the adverse health consequences related to untreated sleep disordered breathing - specifically discussed the risks for hypertension, coronary artery disease, cardiac dysrhythmias, cerebrovascular disease, and diabetes - lifestyle modification discussed   - discussed how sleep disruption can increase risk of accidents, particularly when driving - safe driving practices were discussed  Patient Instructions  Given your symptoms, I am concerned that you may have sleep disordered breathing with sleep apnea. You will need a sleep study for further evaluation. Someone will contact you to schedule this.   We discussed how untreated sleep apnea puts an individual at risk for cardiac arrhthymias, pulm HTN, DM, stroke and increases their risk for daytime accidents. We also briefly reviewed treatment options including weight loss, side sleeping position, oral appliance, CPAP therapy or referral to ENT for possible surgical options  Use caution when driving and pull over if you become sleepy.  Continue Albuterol  inhaler 2 puffs every 6 hours as needed for shortness of breath or wheezing. Notify if symptoms persist despite rescue inhaler/neb use.  Continue Symbicot 2 puffs Twice daily. Brush tongue and rinse mouth afterwards  Continue montelukast  1 tab daily  Continue flonase nasal spray 2 sprays each nostril daily  No significant inflammation in your airways and lungs sound good today I think most of the cough is related to postnasal drainage You can use benzonatate  1 capsule Three times a day as needed for cough Delsym 2 tsp Twice daily as needed for cough Non menthol throat lozenges and avoid throat clearing   Follow up in 6 weeks with Katie Demetri Goshert,NP to go over  sleep study results, or sooner, if needed. Friday PM virtual clinic preferred       Upper airway cough syndrome Does not appear to be in acute asthma exacerbation. Lung exam clear. Exhaled nitric oxide  testing normal, indicating no significant airway inflammation. Will treat with cough control and supportive care. Encouraged to continue allergy regimen.   Mild persistent asthma Mild persistent asthma. See above plan. Action plan in place. Continue inhaler regimen.   Class 3 severe obesity with serious comorbidity and body mass index (BMI) of 40.0 to 44.9 in adult [facility]) Healthy weight loss encouraged. If she does have moderate to severe OSA, would be an appropriate candidate for GLP-1 therapy with Zepbound. Will readdress at follow up.     I spent 45 minutes of dedicated to the care of this patient on the date of this encounter to include pre-visit review of records, face-to-face time with the patient discussing conditions above, post visit ordering of testing, clinical documentation with the electronic health record, making appropriate referrals as documented, and communicating necessary findings to members of the patients care team.  Comer LULLA Rouleau, NP 09/24/2024  Pt aware and understands NP's role.

## 2024-09-24 NOTE — Assessment & Plan Note (Signed)
 Mild persistent asthma. See above plan. Action plan in place. Continue inhaler regimen.

## 2024-09-24 NOTE — Progress Notes (Deleted)
 @Patient  ID: Alicia Cox, female    DOB: [birth year], 53 y.o.   MRN: [id number]  Chief Complaint  Patient presents with   Consult    Headaches in morning, snoring.    Referring provider: Domenica Harlene LABOR, MD  HPI:   TEST/EVENTS:   Allergies  Allergen Reactions   Metformin  And Related Nausea Only    Immunization History  Administered Date(s) Administered   Influenza, Seasonal, Injecte, Preservative Fre 10/21/2013   Influenza,inj,Quad PF,6+ Mos 10/21/2013, 08/14/2014, 09/02/2015, 09/14/2016, 09/17/2017, 09/26/2019   Influenza-Unspecified 09/24/2018   PFIZER(Purple Top)SARS-COV-2 Vaccination 07/30/2020, 08/20/2020   Tdap 10/21/2013, 03/05/2024   Zoster Recombinant(Shingrix) 07/03/2023, 10/03/2023    Past Medical History:  Diagnosis Date   Acid reflux    Acute bronchitis 11/03/2013   Allergy    hay fever   Anxiety    Arthritis    Asthma    Back pain    Blood in stool    Cervical cancer screening 09/14/2016   Menarche at 13 Regular and heavy flow no history of abnormal pap in past G2P2, s/p 2 svd No history of abnormal MGM No concerns today  gyn surgeries: tubal and endometrial ablation LMP 09/13/2016   Chest pain 08/24/2013   Chicken pox as a child   Depression    Dermatitis 03/02/2016   Frequent headaches    Gallbladder problem    GERD (gastroesophageal reflux disease)    Hashimoto's disease    Heart murmur    History of blood clots    Hypothyroidism    Hashimoto's   IBS (irritable bowel syndrome)    Joint pain    Knee pain, left 01/09/2014   Low back pain 08/26/2017   Migraine    Obesity 11/15/2014   Preventative health care 09/12/2015   Rheumatoid arthritis (HCC)    Sinus complaint    SOBOE (shortness of breath on exertion)    TOS (thoracic outlet syndrome) 08/25/2013   UTI (urinary tract infection)    Vitamin D  deficiency     Tobacco History: Social History   Tobacco Use  Smoking Status Never  Smokeless Tobacco Never  Tobacco Comments    NEVER USED TOBACCO   Counseling given: Not Answered Tobacco comments: NEVER USED TOBACCO   Outpatient Medications Prior to Visit  Medication Sig Dispense Refill   albuterol  (VENTOLIN  HFA) 108 (90 Base) MCG/ACT inhaler Inhale 2 puffs into the lungs every 6 (six) hours as needed for wheezing or shortness of breath. 18 g 5   aspirin 81 MG EC tablet Take by mouth.     azelastine  (ASTELIN ) 0.1 % nasal spray Place 1 spray into both nostrils daily. Use in each nostril as directed 30 mL 12   budesonide-formoterol (SYMBICORT) 160-4.5 MCG/ACT inhaler Inhale 1 puff into the lungs 2 (two) times daily. 30.6 g 0   buPROPion  (WELLBUTRIN  XL) 300 MG 24 hr tablet Take 1 tablet by mouth once daily 90 tablet 0   dicyclomine  (BENTYL ) 10 MG capsule Take 1 capsule (10 mg total) by mouth 3 (three) times daily as needed for spasms. 90 capsule 1   escitalopram  (LEXAPRO ) 10 MG tablet Take 1 tablet by mouth once daily 90 tablet 1   gabapentin  (NEURONTIN ) 300 MG capsule Take 1 capsule (300 mg total) by mouth at bedtime. 90 capsule 3   levothyroxine  (SYNTHROID ) 100 MCG tablet TAKE 1 TABLET BY MOUTH ONCE DAILY EXCEPT  TAKE  1  1/2  TABLETS  ON  THURSDAYS  AND  SUNDAYS 105 tablet 1  montelukast  (SINGULAIR ) 10 MG tablet TAKE 1 TABLET BY MOUTH AT BEDTIME 90 tablet 1   tiZANidine  (ZANAFLEX ) 4 MG tablet Take 1 tablet (4 mg total) by mouth at bedtime. 90 tablet 3   Vitamin D , Ergocalciferol , (DRISDOL ) 1.25 MG (50000 UNIT) CAPS capsule Take 1 capsule (50,000 Units total) by mouth every 7 (seven) days. 4 capsule 4   metFORMIN  (GLUCOPHAGE ) 500 MG tablet Take 1 tablet (500 mg total) by mouth daily with breakfast. (Patient not taking: Reported on 09/08/2024) 90 tablet 1   No facility-administered medications prior to visit.     Review of Systems:   Constitutional: No weight loss or gain, night sweats, fevers, chills, fatigue, or lassitude. HEENT: No headaches, difficulty swallowing, tooth/dental problems, or sore throat. No  sneezing, itching, ear ache, nasal congestion, or post nasal drip CV:  No chest pain, orthopnea, PND, swelling in lower extremities, anasarca, dizziness, palpitations, syncope Resp: No shortness of breath with exertion or at rest. No excess mucus or change in color of mucus. No productive or non-productive. No hemoptysis. No wheezing.  No chest wall deformity GI:  No heartburn, indigestion, abdominal pain, nausea, vomiting, diarrhea, change in bowel habits, loss of appetite, bloody stools.  GU: No dysuria, change in color of urine, urgency or frequency.  No flank pain, no hematuria  Skin: No rash, lesions, ulcerations MSK:  No joint pain or swelling.  No decreased range of motion.  No back pain. Neuro: No dizziness or lightheadedness.  Psych: No depression or anxiety. Mood stable.     Physical Exam:  BP 128/80   Pulse 84   Temp 98.2 F (36.8 C) (Oral)   Ht 5' 4 (1.626 m)   Wt 260 lb 9.6 oz (118.2 kg)   SpO2 97% Comment: room air  BMI 44.73 kg/m   GEN: Pleasant, interactive, well-nourished/chronically-ill appearing/acutely-ill appearing/poorly-nourished/morbidly obese; in no acute distress.****** HEENT:  Normocephalic and atraumatic. EACs patent bilaterally. TM pearly gray with present light reflex bilaterally. PERRLA. Sclera white. Nasal turbinates pink, moist and patent bilaterally. No rhinorrhea present. Oropharynx pink and moist, without exudate or edema. No lesions, ulcerations, or postnasal drip.  NECK:  Supple w/ fair ROM. No JVD present. Normal carotid impulses w/o bruits. Thyroid  symmetrical with no goiter or nodules palpated. No lymphadenopathy.   CV: RRR, no m/r/g, no peripheral edema. Pulses intact, +2 bilaterally. No cyanosis, pallor or clubbing. PULMONARY:  Unlabored, regular breathing. Clear bilaterally A&P w/o wheezes/rales/rhonchi. No accessory muscle use.  GI: BS present and normoactive. Soft, non-tender to palpation. No organomegaly or masses detected. No CVA  tenderness. MSK: No erythema, warmth or tenderness. Cap refil <2 sec all extrem. No deformities or joint swelling noted.  Neuro: A/Ox3. No focal deficits noted.   Skin: Warm, no lesions or rashe Psych: Normal affect and behavior. Judgement and thought content appropriate.     Lab Results:  CBC    Component Value Date/Time   WBC 10.3 09/17/2024 1316   RBC 4.35 09/17/2024 1316   HGB 14.4 09/17/2024 1316   HGB 15.2 07/17/2022 1144   HGB 14.7 05/12/2015 0921   HCT 42.9 09/17/2024 1316   HCT 44.8 07/17/2022 1144   HCT 42.8 05/12/2015 0921   PLT 314.0 09/17/2024 1316   PLT 318 07/17/2022 1144   MCV 98.5 09/17/2024 1316   MCV 99 (H) 07/17/2022 1144   MCV 97 05/12/2015 0921   MCH 33.7 (H) 07/17/2022 1144   MCH 32.5 04/07/2020 1428   MCHC 33.7 09/17/2024 1316   RDW 12.6  09/17/2024 1316   RDW 11.5 (L) 07/17/2022 1144   RDW 12.2 05/12/2015 0921   LYMPHSABS 2.4 09/17/2024 1316   LYMPHSABS 1.7 07/17/2022 1144   LYMPHSABS 2.6 05/12/2015 0921   MONOABS 0.6 09/17/2024 1316   EOSABS 0.2 09/17/2024 1316   EOSABS 0.2 07/17/2022 1144   EOSABS 0.4 05/12/2015 0921   BASOSABS 0.2 (H) 09/17/2024 1316   BASOSABS 0.1 07/17/2022 1144   BASOSABS 0.1 05/12/2015 0921    BMET    Component Value Date/Time   NA 137 09/17/2024 1316   NA 142 07/17/2022 1144   K 3.8 09/17/2024 1316   CL 103 09/17/2024 1316   CO2 23 09/17/2024 1316   GLUCOSE 102 (H) 09/17/2024 1316   BUN 13 09/17/2024 1316   BUN 17 07/17/2022 1144   CREATININE 0.70 09/17/2024 1316   CREATININE 0.69 04/07/2020 1428   CREATININE 0.70 03/18/2020 1529   CALCIUM 9.5 09/17/2024 1316   GFRNONAA >60 04/07/2020 1428   GFRAA >60 04/07/2020 1428    BNP No results found for: BNP   Imaging:  DG Knee Bilateral Standing AP Result Date: 09/22/2024 EXAM: ANTEROPOSTERIOR (1) VIEW(S) XRAY OF THE BILATERAL KNEE 09/17/2024 01:54:56 PM COMPARISON: None available. CLINICAL HISTORY: knee pain. Intermittent bilateral knee pain x 10 years,  NKI FINDINGS: BONES AND JOINTS: No acute fracture. No focal osseous lesion. No joint dislocation. No significant joint effusion. No significant degenerative changes. SOFT TISSUES: The soft tissues are unremarkable. IMPRESSION: 1. Normal AP standing radiograph of the bilateral knees. Electronically signed by: Selinda Blue MD 09/22/2024 11:24 AM EDT RP Workstation: [id number]    Administration History     None           No data to display          No results found for: NITRICOXIDE      Assessment & Plan:   No problem-specific Assessment & Plan notes found for this encounter.   Advised if symptoms do not improve or worsen, to please contact office for sooner follow up or seek emergency care.   I spent *** minutes of dedicated to the care of this patient on the date of this encounter to include pre-visit review of records, face-to-face time with the patient discussing conditions above, post visit ordering of testing, clinical documentation with the electronic health record, making appropriate referrals as documented, and communicating necessary findings to members of the patients care team.  Alicia LULLA Rouleau, NP 09/24/2024  Pt aware and understands NP's role.

## 2024-09-24 NOTE — Assessment & Plan Note (Signed)
 She has snoring, excessive daytime sleepiness, restless sleep, morning headaches. BMI 44. History of HTN. Epworth 10. Given this,  I am concerned she could have sleep disordered breathing with obstructive sleep apnea. She will need sleep study for further evaluation.    - discussed how weight can impact sleep and risk for sleep disordered breathing - discussed options to assist with weight loss: combination of diet modification, cardiovascular and strength training exercises   - had an extensive discussion regarding the adverse health consequences related to untreated sleep disordered breathing - specifically discussed the risks for hypertension, coronary artery disease, cardiac dysrhythmias, cerebrovascular disease, and diabetes - lifestyle modification discussed   - discussed how sleep disruption can increase risk of accidents, particularly when driving - safe driving practices were discussed  Patient Instructions  Given your symptoms, I am concerned that you may have sleep disordered breathing with sleep apnea. You will need a sleep study for further evaluation. Someone will contact you to schedule this.   We discussed how untreated sleep apnea puts an individual at risk for cardiac arrhthymias, pulm HTN, DM, stroke and increases their risk for daytime accidents. We also briefly reviewed treatment options including weight loss, side sleeping position, oral appliance, CPAP therapy or referral to ENT for possible surgical options  Use caution when driving and pull over if you become sleepy.  Continue Albuterol  inhaler 2 puffs every 6 hours as needed for shortness of breath or wheezing. Notify if symptoms persist despite rescue inhaler/neb use.  Continue Symbicot 2 puffs Twice daily. Brush tongue and rinse mouth afterwards  Continue montelukast  1 tab daily  Continue flonase nasal spray 2 sprays each nostril daily  No significant inflammation in your airways and lungs sound good today I think  most of the cough is related to postnasal drainage You can use benzonatate  1 capsule Three times a day as needed for cough Delsym 2 tsp Twice daily as needed for cough Non menthol throat lozenges and avoid throat clearing   Follow up in 6 weeks with Alicia Tyashia Morrisette,NP to go over sleep study results, or sooner, if needed. Friday PM virtual clinic preferred

## 2024-10-08 ENCOUNTER — Ambulatory Visit: Admitting: Family Medicine

## 2024-10-10 NOTE — Telephone Encounter (Signed)
 Gave patient number to [facility] to schedule sleet test. NFN

## 2024-10-18 ENCOUNTER — Encounter

## 2024-10-18 DIAGNOSIS — G4719 Other hypersomnia: Secondary | ICD-10-CM

## 2024-10-18 DIAGNOSIS — R0683 Snoring: Secondary | ICD-10-CM

## 2024-10-27 ENCOUNTER — Ambulatory Visit: Admitting: Nurse Practitioner

## 2024-10-31 ENCOUNTER — Ambulatory Visit: Payer: Self-pay | Admitting: Nurse Practitioner

## 2024-10-31 DIAGNOSIS — G4733 Obstructive sleep apnea (adult) (pediatric): Secondary | ICD-10-CM | POA: Diagnosis not present

## 2024-10-31 NOTE — Progress Notes (Signed)
 Mild OSA on recent HST. Will discuss at her follow up. Thanks.

## 2024-10-31 NOTE — Progress Notes (Signed)
ATC x1.  LVM to return call. 

## 2024-11-03 NOTE — Telephone Encounter (Signed)
 I called and spoke to pt. Pt informed of Katie's note and verbalized understanding. NFN

## 2024-11-05 ENCOUNTER — Encounter: Payer: Self-pay | Admitting: Family Medicine

## 2024-11-09 ENCOUNTER — Other Ambulatory Visit: Payer: Self-pay | Admitting: Family Medicine

## 2024-11-11 ENCOUNTER — Telehealth (INDEPENDENT_AMBULATORY_CARE_PROVIDER_SITE_OTHER): Admitting: Nurse Practitioner

## 2024-11-11 ENCOUNTER — Encounter: Payer: Self-pay | Admitting: Nurse Practitioner

## 2024-11-11 VITALS — Ht 64.0 in | Wt 260.0 lb

## 2024-11-11 DIAGNOSIS — G4733 Obstructive sleep apnea (adult) (pediatric): Secondary | ICD-10-CM | POA: Diagnosis not present

## 2024-11-11 DIAGNOSIS — J4530 Mild persistent asthma, uncomplicated: Secondary | ICD-10-CM

## 2024-11-11 NOTE — Assessment & Plan Note (Signed)
 Well controlled on current regimen. No recent exacerbations. Improved cough. Action plan in place

## 2024-11-11 NOTE — Progress Notes (Signed)
 @Patient  ID: Alicia Cox, female    DOB: 02/01/1971, 53 y.o.   MRN: [id number]  Chief Complaint  Patient presents with   Follow-up    Sleep study f/u  Hst 10-18-24    Referring provider: Domenica Harlene LABOR, MD  Virtual Visit via Video Note  I connected with Alicia Cox on 11/11/24 at 10:00 AM EST by a video enabled telemedicine application and verified that I am speaking with the correct person using two identifiers.  Location: Patient: Home Provider: Office   I discussed the limitations of evaluation and management by telemedicine and the availability of in person appointments. The patient expressed understanding and agreed to proceed.  History of Present Illness: 53 year old female, never smoker followed for chronic cough/bronchitis suspected to be related to asthma/UACS and allergic rhinitis. She is a patient of Dr. Correne and last seen in office on 09/24/2024 for sleep consult by [facility] NP. Past medical history significant for migraines, GERD, hypothyroidism, anxiety, obesity, vit D deficiency.   TEST/EVENTS:  02/06/2022 office spirometry: FVC 2.8 (78), FEV1 2.3 (81), ratio 82 02/22/2022 CXR 2 view: both lungs were clear. There was no evidence of acute process.  10/18/2024 HST: AHI 8.1/h, SpO2 low 84%  05/09/2023: OV with Dr. Meade. One year f/u. On Advair Twice daily. No albuterol  use. Had the flu in the winter. On flonase bid. Saline spray and zyrtec bid. Singulair  use. Still having allergic rhinitis. Has lost 70 lb. Can decrease Advair to once daily.   09/24/2024: Alicia Cox with Alicia Machia NP MURPHY BUNDICK is a 53 year old female who presents for a sleep consult due to morning headaches and snoring. She experiences morning headaches and snoring at night, accompanied by persistent daytime fatigue. She feels tired throughout the day. She has not undergone sleep testing in approximately 20 years, at which time no issues were identified. She notes significant weight fluctuations,  having lost 70 pounds and then regained 60 pounds over the last year. She reports that her symptoms are worse when her weight is higher. She denies issues with drowsy driving, sleepwalking, or sleep paralysis. She wakes up frequently at night and takes about 30 minutes to fall asleep, which she acknowledges as typical for her. She consumes two cups of coffee daily and drinks water otherwise. No alcohol intake. Her blood pressure has increased over the past six months as well.  Goes to bed between 9-10 pm. Wakes 2-3 times a night. Gets up around 530 am. 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  Regarding her asthma, she uses Symbicort and Flonase nasal spray regularly. She uses her albuterol  inhaler about three to four times a day right now. Her asthma symptoms are exacerbated by allergies, which she feels has been worse recently. She has a daily dry cough, that's bothersome. Still has nasal drainage. Worse since fall allergy season started. No wheezing or chest tightness. No fevers, hemoptysis. Breathing is ok.  FeNO 7 ppb - normal  Epworth 10   11/11/2024: Today - follow up Discussed the use of AI scribe software for clinical note transcription with the patient, who gave verbal consent to proceed.  History of Present Illness Alicia Cox is a 53 year old female with mild sleep apnea who presents for follow-up on her sleep study results.  She experiences morning headaches, snoring, daytime sleepiness, and non-restorative sleep. She sleeps on her side and avoids sleeping on her back.No drowsy driving or sleep parasomnias. No sleep aids.   Her sleep  study results confirmed mild sleep apnea. She wants to discuss treatment options.   Her asthma is well-controlled. She has not needed her rescue inhaler recently. No exacerbations or hospitalizations.     Allergies  Allergen Reactions   Metformin  And Related Nausea Only    Immunization History  Administered Date(s) Administered   Influenza,  Seasonal, Injecte, Preservative Fre 10/21/2013   Influenza,inj,Quad PF,6+ Mos 10/21/2013, 08/14/2014, 09/02/2015, 09/14/2016, 09/17/2017, 09/26/2019   Influenza-Unspecified 09/24/2018   PFIZER(Purple Top)SARS-COV-2 Vaccination 07/30/2020, 08/20/2020   Tdap 10/21/2013, 03/05/2024   Zoster Recombinant(Shingrix) 07/03/2023, 10/03/2023    Past Medical History:  Diagnosis Date   Acid reflux    Acute bronchitis 11/03/2013   Allergy    hay fever   Anxiety    Arthritis    Asthma    Back pain    Blood in stool    Cervical cancer screening 09/14/2016   Menarche at 13 Regular and heavy flow no history of abnormal pap in past G2P2, s/p 2 svd No history of abnormal MGM No concerns today  gyn surgeries: tubal and endometrial ablation LMP 09/13/2016   Chest pain 08/24/2013   Chicken pox as a child   Depression    Dermatitis 03/02/2016   Frequent headaches    Gallbladder problem    GERD (gastroesophageal reflux disease)    Hashimoto's disease    Heart murmur    History of blood clots    Hypothyroidism    Hashimoto's   IBS (irritable bowel syndrome)    Joint pain    Knee pain, left 01/09/2014   Low back pain 08/26/2017   Migraine    Obesity 11/15/2014   Preventative health care 09/12/2015   Rheumatoid arthritis (HCC)    Sinus complaint    SOBOE (shortness of breath on exertion)    TOS (thoracic outlet syndrome) 08/25/2013   UTI (urinary tract infection)    Vitamin D  deficiency     Tobacco History: Social History   Tobacco Use  Smoking Status Never  Smokeless Tobacco Never  Tobacco Comments   NEVER USED TOBACCO   Counseling given: Not Answered Tobacco comments: NEVER USED TOBACCO   Outpatient Medications Prior to Visit  Medication Sig Dispense Refill   albuterol  (VENTOLIN  HFA) 108 (90 Base) MCG/ACT inhaler Inhale 2 puffs into the lungs every 6 (six) hours as needed for wheezing or shortness of breath. 18 g 5   aspirin 81 MG EC tablet Take by mouth.     azelastine   (ASTELIN ) 0.1 % nasal spray Place 1 spray into both nostrils daily. Use in each nostril as directed 30 mL 12   budesonide-formoterol (SYMBICORT) 160-4.5 MCG/ACT inhaler Inhale 1 puff into the lungs 2 (two) times daily. 30.6 g 0   buPROPion  (WELLBUTRIN  XL) 300 MG 24 hr tablet Take 1 tablet (300 mg total) by mouth daily. 90 tablet 1   dicyclomine  (BENTYL ) 10 MG capsule Take 1 capsule (10 mg total) by mouth 3 (three) times daily as needed for spasms. 90 capsule 1   escitalopram  (LEXAPRO ) 10 MG tablet Take 1 tablet (10 mg total) by mouth daily. 90 tablet 1   gabapentin  (NEURONTIN ) 300 MG capsule Take 1 capsule (300 mg total) by mouth at bedtime. 90 capsule 3   levothyroxine  (SYNTHROID ) 100 MCG tablet TAKE 1 TABLET BY MOUTH ONCE DAILY EXCEPT  TAKE  1  1/2  TABLETS  ON  THURSDAYS  AND  SUNDAYS 105 tablet 1   montelukast  (SINGULAIR ) 10 MG tablet Take 1 tablet (10 mg total)  by mouth at bedtime. 90 tablet 1   tiZANidine  (ZANAFLEX ) 4 MG tablet Take 1 tablet (4 mg total) by mouth at bedtime. 90 tablet 3   Vitamin D , Ergocalciferol , (DRISDOL ) 1.25 MG (50000 UNIT) CAPS capsule Take 1 capsule (50,000 Units total) by mouth every 7 (seven) days. 4 capsule 4   No facility-administered medications prior to visit.     Review of Systems: as above    Physical Exam: Patient is well-developed, well-nourished in no acute distress.  Resting comfortably at home.  No labored breathing.  Speech is clear and coherent with logical content.  Patient is alert and oriented at baseline.     Assessment & Plan:   Mild obstructive sleep apnea Mild OSA. Reviewed minimal risks of untreated mild OSA. High symptom burden. Shared decision to start CPAP given this. Educated on proper use/care of device. Risks/benefits reviewed. Orders placed for new start auto CPAP 5-15 cmH2O. Healthy weight management encouraged. Safe driving practices reviewed.  Patient Instructions  Start CPAP every night, minimum of 4-6 hours a night.   Change equipment as directed. Wash your tubing with warm soap and water daily, hang to dry. Wash humidifier portion weekly. Use bottled, distilled water and change daily Be aware of reduced alertness and do not drive or operate heavy machinery if experiencing this or drowsiness.  Exercise encouraged, as tolerated. Healthy weight management discussed.  Avoid or decrease alcohol consumption and medications that make you more sleepy, if possible. Notify if persistent daytime sleepiness occurs even with consistent use of PAP therapy.  Change CPAP supplies... Every month Mask cushions and/or nasal pillows CPAP machine filters Every 3 months Mask frame (not including the headgear) CPAP tubing Every 6 months Mask headgear Chin strap (if applicable) Humidifier water tub    We discussed how untreated sleep apnea puts an individual at risk for cardiac arrhthymias, pulm HTN, DM, stroke and increases their risk for daytime accidents; although, these are minimal with mild sleep apnea. We also briefly reviewed treatment options including weight loss, side sleeping position, oral appliance, CPAP therapy or referral to ENT for possible surgical options   Use caution when driving and pull over if you become sleepy.   Continue Albuterol  inhaler 2 puffs every 6 hours as needed for shortness of breath or wheezing. Notify if symptoms persist despite rescue inhaler/neb use.  Continue Symbicot 2 puffs Twice daily. Brush tongue and rinse mouth afterwards  Continue montelukast  1 tab daily  Continue flonase nasal spray 2 sprays each nostril daily Continue benzonatate  1 capsule Three times a day as needed for cough  Follow up in 10-12 weeks with any sleep provider to see how CPAP is going. If symptoms do not improve or worsen, please contact office for sooner follow up or seek emergency care.    Mild persistent asthma Well controlled on current regimen. No recent exacerbations. Improved cough. Action plan  in place   I discussed the assessment and treatment plan with the patient. The patient was provided an opportunity to ask questions and all were answered. The patient agreed with the plan and demonstrated an understanding of the instructions.   The patient was advised to call back or seek an in-person evaluation if the symptoms worsen or if the condition fails to improve as anticipated.  I provided 31 minutes of non-face-to-face time during this encounter.  Comer Alicia Rouleau, NP 11/11/2024  Pt aware and understands NP's role.

## 2024-11-11 NOTE — Patient Instructions (Addendum)
 Start CPAP every night, minimum of 4-6 hours a night.  Change equipment as directed. Wash your tubing with warm soap and water daily, hang to dry. Wash humidifier portion weekly. Use bottled, distilled water and change daily Be aware of reduced alertness and do not drive or operate heavy machinery if experiencing this or drowsiness.  Exercise encouraged, as tolerated. Healthy weight management discussed.  Avoid or decrease alcohol consumption and medications that make you more sleepy, if possible. Notify if persistent daytime sleepiness occurs even with consistent use of PAP therapy.  Change CPAP supplies... Every month Mask cushions and/or nasal pillows CPAP machine filters Every 3 months Mask frame (not including the headgear) CPAP tubing Every 6 months Mask headgear Chin strap (if applicable) Humidifier water tub    We discussed how untreated sleep apnea puts an individual at risk for cardiac arrhthymias, pulm HTN, DM, stroke and increases their risk for daytime accidents; although, these are minimal with mild sleep apnea. We also briefly reviewed treatment options including weight loss, side sleeping position, oral appliance, CPAP therapy or referral to ENT for possible surgical options   Use caution when driving and pull over if you become sleepy.   Continue Albuterol  inhaler 2 puffs every 6 hours as needed for shortness of breath or wheezing. Notify if symptoms persist despite rescue inhaler/neb use.  Continue Symbicot 2 puffs Twice daily. Brush tongue and rinse mouth afterwards  Continue montelukast  1 tab daily  Continue flonase nasal spray 2 sprays each nostril daily Continue benzonatate  1 capsule Three times a day as needed for cough  Follow up in 10-12 weeks with any sleep provider to see how CPAP is going. If symptoms do not improve or worsen, please contact office for sooner follow up or seek emergency care.

## 2024-11-11 NOTE — Assessment & Plan Note (Signed)
 Mild OSA. Reviewed minimal risks of untreated mild OSA. High symptom burden. Shared decision to start CPAP given this. Educated on proper use/care of device. Risks/benefits reviewed. Orders placed for new start auto CPAP 5-15 cmH2O. Healthy weight management encouraged. Safe driving practices reviewed.  Patient Instructions  Start CPAP every night, minimum of 4-6 hours a night.  Change equipment as directed. Wash your tubing with warm soap and water daily, hang to dry. Wash humidifier portion weekly. Use bottled, distilled water and change daily Be aware of reduced alertness and do not drive or operate heavy machinery if experiencing this or drowsiness.  Exercise encouraged, as tolerated. Healthy weight management discussed.  Avoid or decrease alcohol consumption and medications that make you more sleepy, if possible. Notify if persistent daytime sleepiness occurs even with consistent use of PAP therapy.  Change CPAP supplies... Every month Mask cushions and/or nasal pillows CPAP machine filters Every 3 months Mask frame (not including the headgear) CPAP tubing Every 6 months Mask headgear Chin strap (if applicable) Humidifier water tub    We discussed how untreated sleep apnea puts an individual at risk for cardiac arrhthymias, pulm HTN, DM, stroke and increases their risk for daytime accidents; although, these are minimal with mild sleep apnea. We also briefly reviewed treatment options including weight loss, side sleeping position, oral appliance, CPAP therapy or referral to ENT for possible surgical options   Use caution when driving and pull over if you become sleepy.   Continue Albuterol  inhaler 2 puffs every 6 hours as needed for shortness of breath or wheezing. Notify if symptoms persist despite rescue inhaler/neb use.  Continue Symbicot 2 puffs Twice daily. Brush tongue and rinse mouth afterwards  Continue montelukast  1 tab daily  Continue flonase nasal spray 2 sprays each  nostril daily Continue benzonatate  1 capsule Three times a day as needed for cough  Follow up in 10-12 weeks with any sleep provider to see how CPAP is going. If symptoms do not improve or worsen, please contact office for sooner follow up or seek emergency care.

## 2024-11-17 NOTE — Progress Notes (Unsigned)
 [name] [address] [place] [address] Phone: [phone] Subjective:   Alicia Cox am a scribe for Dr. Claudene.   I'm seeing this patient by the request  of:  Domenica Harlene LABOR, MD  CC: Knee pain  YEP:Dlagzrupcz  09/17/2024 Chronic problem with worsening symptoms with patient recently gaining weight.  Wanting to BMI under 50.  X-rays ordered today.  Patient recently did get labs by primary care provider as well and will await some of those to see if anything else needs to change such as vitamin D  supplementation.  Patient is also being worked up for potential sleep apnea which I think could be increasing some of her pain as well.  Discussed icing regimen and home exercises otherwise.  Could be a candidate for viscosupplementation and will have a follow-up in 2 months.  Nighttime pain refilled patient's gabapentin  300 mg as well as Zanaflex  4 mg as needed     Updated 11/19/2024 Alicia Cox is a 53 y.o. female coming in with complaint of knee pain. Patient states that the knee pain is ok today. It is an on and off thing. The cold weather doesn't help it at all.  It seems to sometimes get in the way of her increasing activity.       Past Medical History:  Diagnosis Date   Acid reflux    Acute bronchitis 11/03/2013   Allergy    hay fever   Anxiety    Arthritis    Asthma    Back pain    Blood in stool    Cervical cancer screening 09/14/2016   Menarche at 13 Regular and heavy flow no history of abnormal pap in past G2P2, s/p 2 svd No history of abnormal MGM No concerns today  gyn surgeries: tubal and endometrial ablation LMP 09/13/2016   Chest pain 08/24/2013   Chicken pox as a child   Depression    Dermatitis 03/02/2016   Frequent headaches    Gallbladder problem    GERD (gastroesophageal reflux disease)    Hashimoto's disease    Heart murmur    History of blood clots    Hypothyroidism    Hashimoto's   IBS (irritable bowel  syndrome)    Joint pain    Knee pain, left 01/09/2014   Low back pain 08/26/2017   Migraine    Obesity 11/15/2014   Preventative health care 09/12/2015   Rheumatoid arthritis (HCC)    Sinus complaint    SOBOE (shortness of breath on exertion)    TOS (thoracic outlet syndrome) 08/25/2013   UTI (urinary tract infection)    Vitamin D  deficiency    Past Surgical History:  Procedure Laterality Date   ABLATION  2009   APPENDECTOMY  1994   CESAREAN SECTION  01/10/1993   CESAREAN SECTION  04/21/1998   CHOLECYSTECTOMY  2011   ENDOMETRIAL ABLATION     TONSILLECTOMY  1983   TUBAL LIGATION  1999   Social History   Socioeconomic History   Marital status: Married    Spouse name: Brynlynn Walko   Number of children: 2   Years of education: Not on file   Highest education level: Associate degree: academic program  Occupational History   Occupation: Runner, Broadcasting/film/video at [employer]    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  Tobacco Use   Smoking status: Never   Smokeless tobacco: Never   Tobacco comments:    NEVER USED TOBACCO  Vaping Use  Vaping status: Never Used  Substance and Sexual Activity   Alcohol use: No    Alcohol/week: 0.0 standard drinks of alcohol   Drug use: No   Sexual activity: Yes    Partners: Male    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, no dietary restrictions, 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  Other Topics Concern   Not on file  Social History Narrative   Not on file   Social Drivers of Health   Financial Resource Strain: Low Risk  (03/11/2023)   Overall Financial Resource Strain (CARDIA)    Difficulty of Paying Living Expenses: Not very hard  Food Insecurity: No Food Insecurity (03/11/2023)   Hunger Vital Sign    Worried About Running Out of Food in the Last Year: Never true    Ran Out of Food in the Last Year: Never true  Transportation Needs: No Transportation Needs (03/11/2023)   PRAPARE - Administrator, Civil Service (Medical): No    Lack of Transportation  (Non-Medical): No  Physical Activity: Insufficiently Active (03/11/2023)   Exercise Vital Sign    Days of Exercise per Week: 3 days    Minutes of Exercise per Session: 20 min  Stress: No Stress Concern Present (03/11/2023)   [employer] of Occupational Health - Occupational Stress Questionnaire    Feeling of Stress : Only a little  Social Connections: Moderately Integrated (03/11/2023)   Social Connection and Isolation Panel    Frequency of Communication with Friends and Family: Twice a week    Frequency of Social Gatherings with Friends and Family: Once a week    Attends Religious Services: More than 4 times per year    Active Member of [employer] or Organizations: No    Attends Engineer, Structural: Not on file    Marital Status: Married   Allergies  Allergen Reactions   Metformin  And Related Nausea Only   Family History  Problem Relation Age of Onset   Kidney disease Mother    Cancer Mother        skin cancer   Hyperlipidemia Mother    Hypertension Mother    Skin cancer Mother    Diabetes Father        type 2   COPD Sister        genetic   Heart disease Sister        CHF, CAD s/p stenting   Kidney disease Sister    Cancer Sister        BCC   Hypertension Sister    Diabetes Sister    Asthma Daughter    COPD Paternal Aunt    Heart disease Maternal Grandmother    Diabetes Maternal Grandmother    Heart attack Maternal Grandfather    Diabetes Maternal Grandfather    Diabetes Paternal Grandmother    Diabetes Paternal Grandfather     Current Outpatient Medications (Endocrine & Metabolic):    levothyroxine  (SYNTHROID ) 100 MCG tablet, TAKE 1 TABLET BY MOUTH ONCE DAILY EXCEPT  TAKE  1  1/2  TABLETS  ON  THURSDAYS  AND  SUNDAYS   Current Outpatient Medications (Respiratory):    albuterol  (VENTOLIN  HFA) 108 (90 Base) MCG/ACT inhaler, Inhale 2 puffs into the lungs every 6 (six) hours as needed for wheezing or shortness of breath.   azelastine  (ASTELIN ) 0.1 % nasal  spray, Place 1 spray into both nostrils daily. Use in each nostril as directed   budesonide-formoterol (SYMBICORT) 160-4.5 MCG/ACT inhaler, Inhale 1 puff into the lungs 2 (  two) times daily.   montelukast  (SINGULAIR ) 10 MG tablet, Take 1 tablet (10 mg total) by mouth at bedtime.  Current Outpatient Medications (Analgesics):    aspirin 81 MG EC tablet, Take by mouth.   Current Outpatient Medications (Other):    buPROPion  (WELLBUTRIN  XL) 300 MG 24 hr tablet, Take 1 tablet (300 mg total) by mouth daily.   dicyclomine  (BENTYL ) 10 MG capsule, Take 1 capsule (10 mg total) by mouth 3 (three) times daily as needed for spasms.   DULoxetine (CYMBALTA) 20 MG capsule, Take 1 capsule (20 mg total) by mouth daily.   escitalopram  (LEXAPRO ) 10 MG tablet, Take 1 tablet (10 mg total) by mouth daily.   gabapentin  (NEURONTIN ) 300 MG capsule, Take 1 capsule (300 mg total) by mouth at bedtime.   tiZANidine  (ZANAFLEX ) 4 MG tablet, Take 1 tablet (4 mg total) by mouth at bedtime.   Vitamin D , Ergocalciferol , (DRISDOL ) 1.25 MG (50000 UNIT) CAPS capsule, Take 1 capsule (50,000 Units total) by mouth every 7 (seven) days.   Reviewed prior external information including notes and imaging from  primary care provider As well as notes that were available from care everywhere and other healthcare systems.  Past medical history, social, surgical and family history all reviewed in electronic medical record.  No pertanent information unless stated regarding to the chief complaint.   Review of Systems:  No headache, visual changes, nausea, vomiting, diarrhea, constipation, dizziness, abdominal pain, skin rash, fevers, chills, night sweats, weight loss, swollen lymph nodes, body aches, joint swelling, chest pain, shortness of breath, mood changes. POSITIVE muscle aches  Objective  Blood pressure 110/70, pulse 81, height 5' 4 (1.626 m), weight 267 lb (121.1 kg), SpO2 98%.   General: No apparent distress alert and oriented x3  mood and affect normal, dressed appropriately.  HEENT: Pupils equal, extraocular movements intact  Respiratory: Patient's speak in full sentences and does not appear short of breath  Cardiovascular: No lower extremity edema, non tender, no erythema  Antalgic gait noted.  Patient does have effusion noted left greater than right.  Crepitus noted.  After informed written and verbal consent, patient was seated on exam table. Right knee was prepped with alcohol swab and utilizing anterolateral approach, patient's right knee space was injected with 4:1  marcaine 0.5%: Kenalog  40mg /dL. Patient tolerated the procedure well without immediate complications.  After informed written and verbal consent, patient was seated on exam table. Left knee was prepped with alcohol swab and utilizing anterolateral approach, patient's left knee space was injected with 4:1  marcaine 0.5%: Kenalog  40mg /dL. Patient tolerated the procedure well without immediate complications.    Impression and Recommendations:    The above documentation has been reviewed and is accurate and complete Nadege Carriger M Sheletha Bow, DO

## 2024-11-18 ENCOUNTER — Encounter: Payer: Self-pay | Admitting: Nurse Practitioner

## 2024-11-18 NOTE — Telephone Encounter (Signed)
 PCCs, Can someone do some research regarding the patient's insurance and the DME company for her CPAP machine.  She says she was contacted her regarding her insurance and called it secondary insurance and did not take it.  She said it is not her secondary insurance.  They never called her back and she keeps getting their voicemail.  Please let the patient know what the outcome it.  Thank you.

## 2024-11-19 ENCOUNTER — Ambulatory Visit: Admitting: Nurse Practitioner

## 2024-11-19 ENCOUNTER — Ambulatory Visit: Admitting: Family Medicine

## 2024-11-19 ENCOUNTER — Encounter: Admitting: Nurse Practitioner

## 2024-11-19 VITALS — BP 110/70 | HR 81 | Ht 64.0 in | Wt 267.0 lb

## 2024-11-19 DIAGNOSIS — M545 Low back pain, unspecified: Secondary | ICD-10-CM

## 2024-11-19 DIAGNOSIS — M170 Bilateral primary osteoarthritis of knee: Secondary | ICD-10-CM

## 2024-11-19 MED ORDER — DULOXETINE HCL 20 MG PO CPEP: 20.0000 mg | ORAL_CAPSULE | Freq: Every day | ORAL | 0 refills | Status: DC

## 2024-11-19 NOTE — Assessment & Plan Note (Signed)
 Chronic problem noted.  Cymbalta 20 mg was given.  Patient is to have her Lexapro .  Hopefully this will make a benefit for this and some of her other chronic pain.

## 2024-11-19 NOTE — Assessment & Plan Note (Signed)
 Hopeful that this will make a difference.  Discussed potential advanced imaging, discussed the importance of weight loss.  Topical anti-inflammatories and the meloxicam  that has been prescribed previously.  Follow-up again in 6 to 8 weeks

## 2024-11-19 NOTE — Patient Instructions (Addendum)
 Good to see you. Injected knees today.  Cymbalta  20mg  prescribed 1/2 lexapro  Give us  update in 2 weeks about medicine See me again in 10 weeks

## 2024-11-19 NOTE — Telephone Encounter (Signed)
 Copied from CRM #[id number]. Topic: General - Other >> Nov 18, 2024  5:03 PM Lavanda D wrote: Reason for CRM: Patient is returning a call from [place] regarding her insurance coverage, please give patient a call back tomorrow to discuss.  Called and spoke with Geni at [employer]--informed her the patient's [employer] is the primary insurance and she stated [place] is in network.  Informed patient of this and I will resend the DME order to [facility].  She voiced her understanding and gratitude.

## 2024-12-08 ENCOUNTER — Encounter: Payer: Self-pay | Admitting: Family Medicine

## 2024-12-08 ENCOUNTER — Ambulatory Visit: Admitting: Family Medicine

## 2024-12-08 ENCOUNTER — Other Ambulatory Visit: Payer: Self-pay | Admitting: Internal Medicine

## 2024-12-08 VITALS — BP 126/70 | HR 90 | Temp 98.1°F | Ht 64.0 in | Wt 267.8 lb

## 2024-12-08 DIAGNOSIS — J209 Acute bronchitis, unspecified: Secondary | ICD-10-CM

## 2024-12-08 MED ORDER — AZITHROMYCIN 250 MG PO TABS: ORAL_TABLET | ORAL | 0 refills | Status: AC

## 2024-12-08 MED ORDER — ALBUTEROL SULFATE HFA 108 (90 BASE) MCG/ACT IN AERS: 2.0000 | INHALATION_SPRAY | Freq: Four times a day (QID) | RESPIRATORY_TRACT | 5 refills | Status: AC | PRN

## 2024-12-08 MED ORDER — PREDNISONE 20 MG PO TABS: ORAL_TABLET | ORAL | 0 refills | Status: AC

## 2024-12-08 NOTE — Progress Notes (Signed)
 [facility] [address] [place], [place] [address] [phone]  Date:  12/08/2024   Name:  Alicia Cox   DOB:  08-12-1971   MRN:  [id number]  PCP:  Domenica Harlene LABOR, MD    Chief Complaint: Cough (Onset 2 weeks my head hurts really bad, when I cough /Productive cough yellow mucus )   History of Present Illness:  Alicia Cox is a 53 y.o. very pleasant female patient who presents with the following:  Pt seen today with concern of cough for 2 weeks  Primary pt of Dr Domenica History of asthma, OSA Lab Results  Component Value Date   HGBA1C 6.1 09/17/2024    Discussed the use of AI scribe software for clinical note transcription with the patient, who gave verbal consent to proceed.  History of Present Illness Alicia Cox is a 53 year old female with asthma who presents with a persistent cough.  She has been experiencing a cough for three weeks, which varies in severity and has not improved. The cough is more prominent in her chest, and she produces yellow and white phlegm. No fever, chills, sore throat, earache, vomiting, or diarrhea are present.  She has a history of asthma and uses a Symbicort inhaler and albuterol  as needed. Recently, she has been using her albuterol  inhaler more frequently. She also took cough pearls, which provided some relief, but she continues to have coughing spells.  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 This has worsened her breathing issues, leading her to use an air machine in her classroom, which initially helped but the symptoms have returned.  She recalls experiencing similar symptoms in October. She has previously taken prednisone  and tolerated it well.    Patient Active Problem List   Diagnosis Date Noted   Mild obstructive sleep apnea 11/11/2024   Excessive daytime  sleepiness 09/24/2024   Upper airway cough syndrome 09/24/2024   Mild persistent asthma 09/24/2024   Peroneal tendinitis, right 08/02/2023   SOB (shortness of breath) on exertion 03/13/2023   Morbid obesity (HCC)-start bmi 43.26 02/07/2023   BMI 34.0-34.9,adult-current bmi 34.7 02/07/2023   At risk for constipation 09/14/2022   Prediabetes 03/25/2022   Post-viral cough syndrome 03/09/2022   Pure hypertriglyceridemia 03/08/2022   Mood disorder 03/08/2022   Vitamin D  deficiency 03/08/2022   Seasonal allergies 03/08/2022   Acromioclavicular arthrosis 01/02/2022   Right shoulder pain 08/03/2021   Neck mass 09/29/2018   Leukocytosis 09/29/2018   Cervical radiculopathy at C7 08/14/2018   Rotator cuff tear, left 07/01/2018   Neck pain 03/18/2018   Nonallopathic lesion of cervical region 03/06/2018   Nonallopathic lesion of thoracic region 03/06/2018   Nonallopathic lesion of lumbosacral region 03/06/2018   Low back pain 08/26/2017   Frozen shoulder 01/17/2017   Degenerative arthritis of knee 10/25/2016   Cervical cancer screening 09/14/2016   Dermatitis 03/02/2016   Preventative health care 09/12/2015   Class 3 severe obesity with serious comorbidity and body mass index (BMI) of 40.0 to 44.9 in adult [facility]) 11/15/2014   DVT of lower limb, acute (HCC) 11/05/2014   Hyperglycemia 05/17/2014   Hyperlipidemia, mixed 05/17/2014   Anxiety state 05/17/2014   Subacromial bursitis 01/14/2014   Knee pain, left 01/09/2014   TOS (thoracic outlet syndrome) 08/25/2013   Atypical chest pain 08/24/2013   Arthritis  Frequent headaches    GERD (gastroesophageal reflux disease)    Allergy    Heart murmur    Hypothyroidism    Migraine     Past Medical History:  Diagnosis Date   Acid reflux    Acute bronchitis 11/03/2013   Allergy    hay fever   Anxiety    Arthritis    Asthma    Back pain    Blood in stool    Cervical cancer screening 09/14/2016   Menarche at 13 Regular and heavy flow  no history of abnormal pap in past G2P2, s/p 2 svd No history of abnormal MGM No concerns today  gyn surgeries: tubal and endometrial ablation LMP 09/13/2016   Chest pain 08/24/2013   Chicken pox as a child   Depression    Dermatitis 03/02/2016   Frequent headaches    Gallbladder problem    GERD (gastroesophageal reflux disease)    Hashimoto's disease    Heart murmur    History of blood clots    Hypothyroidism    Hashimoto's   IBS (irritable bowel syndrome)    Joint pain    Knee pain, left 01/09/2014   Low back pain 08/26/2017   Migraine    Obesity 11/15/2014   Preventative health care 09/12/2015   Rheumatoid arthritis (HCC)    Sinus complaint    SOBOE (shortness of breath on exertion)    TOS (thoracic outlet syndrome) 08/25/2013   UTI (urinary tract infection)    Vitamin D  deficiency     Past Surgical History:  Procedure Laterality Date   ABLATION  2009   APPENDECTOMY  1994   CESAREAN SECTION  01/10/1993   CESAREAN SECTION  04/21/1998   CHOLECYSTECTOMY  2011   ENDOMETRIAL ABLATION     TONSILLECTOMY  1983   TUBAL LIGATION  1999    Social History[1]  Family History  Problem Relation Age of Onset   Kidney disease Mother    Cancer Mother        skin cancer   Hyperlipidemia Mother    Hypertension Mother    Skin cancer Mother    Diabetes Father        type 2   COPD Sister        genetic   Heart disease Sister        CHF, CAD s/p stenting   Kidney disease Sister    Cancer Sister        BCC   Hypertension Sister    Diabetes Sister    Asthma Daughter    COPD Paternal Aunt    Heart disease Maternal Grandmother    Diabetes Maternal Grandmother    Heart attack Maternal Grandfather    Diabetes Maternal Grandfather    Diabetes Paternal Grandmother    Diabetes Paternal Grandfather     Allergies[2]  Medication list has been reviewed and updated.  Medications Ordered Prior to Encounter[3]  Review of Systems:  As per HPI- otherwise negative.   Physical  Examination: Vitals:   12/08/24 1340  BP: 126/70  Pulse: 90  Temp: 98.1 F (36.7 C)  SpO2: 97%   Vitals:   12/08/24 1340  Weight: 267 lb 12.8 oz (121.5 kg)  Height: 5' 4 (1.626 m)   Body mass index is 45.97 kg/m. Ideal Body Weight: Weight in (lb) to have BMI = 25: 145.3  GEN: no acute distress.  Obese, looks well but is coughing in room HEENT: Atraumatic, Normocephalic.  Bilateral TM wnl, oropharynx normal.  PEERL,EOMI.  Ears and Nose: No external deformity. CV: RRR, No M/G/R. No JVD. No thrill. No extra heart sounds. PULM: Minimal expiratory wheezes auscultated. No retractions. No resp. distress. No accessory muscle use. ABD: S, NT, ND, +BS. No rebound. No HSM. EXTR: No c/c/e PSYCH: Normally interactive. Conversant.    Assessment and Plan: Acute bronchitis, unspecified organism - Plan: azithromycin  (ZITHROMAX ) 250 MG tablet, predniSONE  (DELTASONE ) 20 MG tablet  Assessment & Plan Acute bronchitis Persistent cough with sputum suggests bronchitis. - Prescribed azithromycin : two pills on day one, then one pill daily for four days. - Prescribed prednisone : two pills daily for three days, then one pill daily for three days. - Advised morning dosing of prednisone  to avoid sleep disturbances. - Instructed to report if symptoms do not improve.  Asthma Exacerbation with increased albuterol  use and wheezing. - Continue Singulair  and Symbicort inhaler. - Use albuterol  inhaler as needed.  Signed Harlene Schroeder, MD     [1]  Social History Tobacco Use   Smoking status: Never   Smokeless tobacco: Never   Tobacco comments:    NEVER USED TOBACCO  Vaping Use   Vaping status: Never Used  Substance Use Topics   Alcohol use: No    Alcohol/week: 0.0 standard drinks of alcohol   Drug use: No  [2]  Allergies Allergen Reactions   Metformin  And Related Nausea Only  [3]  Current Outpatient Medications on File Prior to Visit  Medication Sig Dispense Refill   albuterol   (VENTOLIN  HFA) 108 (90 Base) MCG/ACT inhaler Inhale 2 puffs into the lungs every 6 (six) hours as needed for wheezing or shortness of breath. 18 g 5   aspirin 81 MG EC tablet Take by mouth.     azelastine  (ASTELIN ) 0.1 % nasal spray Place 1 spray into both nostrils daily. Use in each nostril as directed 30 mL 12   budesonide-formoterol (SYMBICORT) 160-4.5 MCG/ACT inhaler Inhale 1 puff into the lungs 2 (two) times daily. 30.6 g 0   buPROPion  (WELLBUTRIN  XL) 300 MG 24 hr tablet Take 1 tablet (300 mg total) by mouth daily. 90 tablet 1   dicyclomine  (BENTYL ) 10 MG capsule Take 1 capsule (10 mg total) by mouth 3 (three) times daily as needed for spasms. 90 capsule 1   DULoxetine  (CYMBALTA ) 20 MG capsule Take 1 capsule (20 mg total) by mouth daily. 30 capsule 0   escitalopram  (LEXAPRO ) 10 MG tablet Take 1 tablet (10 mg total) by mouth daily. 90 tablet 1   gabapentin  (NEURONTIN ) 300 MG capsule Take 1 capsule (300 mg total) by mouth at bedtime. 90 capsule 3   levothyroxine  (SYNTHROID ) 100 MCG tablet TAKE 1 TABLET BY MOUTH ONCE DAILY EXCEPT  TAKE  1  1/2  TABLETS  ON  THURSDAYS  AND  SUNDAYS 105 tablet 1   montelukast  (SINGULAIR ) 10 MG tablet Take 1 tablet (10 mg total) by mouth at bedtime. 90 tablet 1   tiZANidine  (ZANAFLEX ) 4 MG tablet Take 1 tablet (4 mg total) by mouth at bedtime. 90 tablet 3   Vitamin D , Ergocalciferol , (DRISDOL ) 1.25 MG (50000 UNIT) CAPS capsule Take 1 capsule (50,000 Units total) by mouth every 7 (seven) days. 4 capsule 4   No current facility-administered medications on file prior to visit.   "

## 2024-12-12 ENCOUNTER — Encounter: Payer: Self-pay | Admitting: Nurse Practitioner

## 2024-12-15 ENCOUNTER — Encounter: Payer: Self-pay | Admitting: Family Medicine

## 2024-12-15 MED ORDER — BUDESONIDE-FORMOTEROL FUMARATE 160-4.5 MCG/ACT IN AERO: 1.0000 | INHALATION_SPRAY | Freq: Two times a day (BID) | RESPIRATORY_TRACT | 0 refills | Status: AC

## 2024-12-16 ENCOUNTER — Other Ambulatory Visit: Payer: Self-pay | Admitting: Family Medicine

## 2024-12-17 ENCOUNTER — Other Ambulatory Visit (HOSPITAL_COMMUNITY): Payer: Self-pay

## 2024-12-19 ENCOUNTER — Encounter: Payer: Self-pay | Admitting: Family Medicine

## 2024-12-23 ENCOUNTER — Other Ambulatory Visit: Payer: Self-pay

## 2024-12-23 ENCOUNTER — Encounter: Payer: Self-pay | Admitting: Family Medicine

## 2024-12-23 DIAGNOSIS — E039 Hypothyroidism, unspecified: Secondary | ICD-10-CM

## 2024-12-23 MED ORDER — DULOXETINE HCL 20 MG PO CPEP: 20.0000 mg | ORAL_CAPSULE | Freq: Every day | ORAL | 0 refills | Status: DC

## 2024-12-23 MED ORDER — LEVOTHYROXINE SODIUM 100 MCG PO TABS: ORAL_TABLET | ORAL | 1 refills | Status: AC

## 2024-12-24 ENCOUNTER — Encounter: Payer: Self-pay | Admitting: Family Medicine

## 2024-12-24 NOTE — Telephone Encounter (Signed)
 Last seen for normal follow up in September.

## 2024-12-30 ENCOUNTER — Other Ambulatory Visit: Payer: Self-pay | Admitting: Family Medicine

## 2024-12-30 ENCOUNTER — Encounter: Payer: Self-pay | Admitting: Family Medicine

## 2024-12-30 MED ORDER — DICYCLOMINE HCL 10 MG PO CAPS: 10.0000 mg | ORAL_CAPSULE | Freq: Three times a day (TID) | ORAL | 1 refills | Status: DC | PRN

## 2024-12-31 ENCOUNTER — Telehealth: Payer: Self-pay

## 2024-12-31 NOTE — Telephone Encounter (Signed)
 Spoke to pt and she wants to know if blood work is needed before she start the wegovy pills? She wants to know if Dr. Domenica want her to schedule appointment with her or Harlene Wheeler CHOL to start the medication. Please advise.

## 2024-12-31 NOTE — Telephone Encounter (Signed)
 Copied from CRM [id number]. Topic: Clinical - Request for Lab/Test Order >> Dec 31, 2024  7:50 AM Harlene ORN wrote: Reason for CRM: Patient called requesting to have lab orders activated for her to have her lab appointment to discuss weight loss with PCP.

## 2024-12-31 NOTE — Telephone Encounter (Signed)
 Patient wants to pay out of pocket and want the medication to go to [facility] She scheduled a f/u appointment with Harlene Wheeler CHOL on 01/28/25 @ 1pm.

## 2025-01-01 ENCOUNTER — Other Ambulatory Visit: Payer: Self-pay | Admitting: Family Medicine

## 2025-01-01 NOTE — Telephone Encounter (Signed)
 Wegovy tablets

## 2025-01-03 ENCOUNTER — Other Ambulatory Visit: Payer: Self-pay | Admitting: Family Medicine

## 2025-01-03 MED ORDER — WEGOVY 1.5 MG PO TABS: 1.5000 mg | ORAL_TABLET | Freq: Every day | ORAL | 1 refills | Status: AC

## 2025-01-05 NOTE — Telephone Encounter (Signed)
 Pt was advised and verbalized understanding.

## 2025-01-14 ENCOUNTER — Other Ambulatory Visit: Payer: Self-pay | Admitting: Family Medicine

## 2025-01-16 ENCOUNTER — Telehealth: Payer: Self-pay

## 2025-01-16 ENCOUNTER — Other Ambulatory Visit (HOSPITAL_COMMUNITY): Payer: Self-pay

## 2025-01-16 NOTE — Telephone Encounter (Signed)
 Pharmacy Patient Advocate Encounter   Received notification from RX Request Messages that prior authorization for Wegovy  1.5mg  tabs is required/requested.   Insurance verification completed.   The patient is insured through [facility].   Per test claim: Per test claim, medication is not covered due to plan/benefit exclusion, PA not submitted at this time

## 2025-01-21 ENCOUNTER — Telehealth: Payer: Self-pay

## 2025-01-21 NOTE — Telephone Encounter (Signed)
 Copied from CRM [id number]. Topic: Appointments - Transfer of Care >> Jan 21, 2025 11:25 AM Berneda FALCON wrote: Pt is requesting to transfer FROM: Alicia Cox Pt is requesting to transfer TO: Alicia Cox Reason for requested transfer: PCP left It is the responsibility of the team the patient would like to transfer to (Dr. Dameron) to reach out to the patient if for any reason this transfer is not acceptable.

## 2025-01-21 NOTE — Telephone Encounter (Signed)
 Sent pt message letting her know OK to transfer.

## 2025-01-22 ENCOUNTER — Other Ambulatory Visit: Payer: Self-pay | Admitting: Family Medicine

## 2025-01-28 ENCOUNTER — Ambulatory Visit: Admitting: Student

## 2025-01-28 ENCOUNTER — Ambulatory Visit: Admitting: Family Medicine

## 2025-02-11 ENCOUNTER — Ambulatory Visit: Admitting: Family Medicine

## 2025-04-08 ENCOUNTER — Encounter: Admitting: Student

## 2025-07-20 ENCOUNTER — Encounter: Admitting: Family Medicine
